# Patient Record
Sex: Female | Born: 1940 | Race: White | Hispanic: No | Marital: Married | Smoking: Former smoker
Health system: Southern US, Community
[De-identification: ages and names within clinical notes are randomized; demographics above are authoritative.]

## PROBLEM LIST (undated history)

## (undated) DIAGNOSIS — K219 Gastro-esophageal reflux disease without esophagitis: Secondary | ICD-10-CM

## (undated) DIAGNOSIS — I499 Cardiac arrhythmia, unspecified: Secondary | ICD-10-CM

## (undated) DIAGNOSIS — R6251 Failure to thrive (child): Secondary | ICD-10-CM

## (undated) DIAGNOSIS — R51 Headache: Secondary | ICD-10-CM

## (undated) DIAGNOSIS — H409 Unspecified glaucoma: Secondary | ICD-10-CM

## (undated) DIAGNOSIS — G43909 Migraine, unspecified, not intractable, without status migrainosus: Secondary | ICD-10-CM

## (undated) DIAGNOSIS — F329 Major depressive disorder, single episode, unspecified: Secondary | ICD-10-CM

## (undated) DIAGNOSIS — F0390 Unspecified dementia without behavioral disturbance: Secondary | ICD-10-CM

## (undated) DIAGNOSIS — M1990 Unspecified osteoarthritis, unspecified site: Secondary | ICD-10-CM

## (undated) DIAGNOSIS — F39 Unspecified mood [affective] disorder: Secondary | ICD-10-CM

## (undated) DIAGNOSIS — F32A Depression, unspecified: Secondary | ICD-10-CM

## (undated) DIAGNOSIS — N289 Disorder of kidney and ureter, unspecified: Secondary | ICD-10-CM

## (undated) DIAGNOSIS — Z972 Presence of dental prosthetic device (complete) (partial): Secondary | ICD-10-CM

## (undated) DIAGNOSIS — E7800 Pure hypercholesterolemia, unspecified: Secondary | ICD-10-CM

## (undated) DIAGNOSIS — Z87442 Personal history of urinary calculi: Secondary | ICD-10-CM

## (undated) DIAGNOSIS — I509 Heart failure, unspecified: Secondary | ICD-10-CM

## (undated) DIAGNOSIS — D509 Iron deficiency anemia, unspecified: Secondary | ICD-10-CM

## (undated) DIAGNOSIS — E538 Deficiency of other specified B group vitamins: Secondary | ICD-10-CM

## (undated) DIAGNOSIS — K259 Gastric ulcer, unspecified as acute or chronic, without hemorrhage or perforation: Secondary | ICD-10-CM

## (undated) DIAGNOSIS — I5022 Chronic systolic (congestive) heart failure: Secondary | ICD-10-CM

## (undated) DIAGNOSIS — M810 Age-related osteoporosis without current pathological fracture: Secondary | ICD-10-CM

## (undated) DIAGNOSIS — R519 Headache, unspecified: Secondary | ICD-10-CM

## (undated) HISTORY — DX: Deficiency of other specified B group vitamins: E53.8

## (undated) HISTORY — PX: CHOLECYSTECTOMY: SHX55

## (undated) HISTORY — DX: Unspecified glaucoma: H40.9

## (undated) HISTORY — DX: Iron deficiency anemia, unspecified: D50.9

## (undated) HISTORY — PX: APPENDECTOMY: SHX54

## (undated) HISTORY — DX: Migraine, unspecified, not intractable, without status migrainosus: G43.909

## (undated) HISTORY — DX: Heart failure, unspecified: I50.9

## (undated) HISTORY — DX: Pure hypercholesterolemia, unspecified: E78.00

## (undated) HISTORY — PX: ESOPHAGOGASTRODUODENOSCOPY: SHX1529

## (undated) HISTORY — PX: LITHOTRIPSY: SUR834

## (undated) HISTORY — PX: BREAST SURGERY: SHX581

## (undated) HISTORY — DX: Personal history of urinary calculi: Z87.442

## (undated) HISTORY — DX: Chronic systolic (congestive) heart failure: I50.22

## (undated) HISTORY — DX: Gastric ulcer, unspecified as acute or chronic, without hemorrhage or perforation: K25.9

## (undated) HISTORY — PX: ABDOMINAL SURGERY: SHX537

## (undated) HISTORY — PX: KYPHOPLASTY: SHX5884

## (undated) HISTORY — DX: Unspecified mood (affective) disorder: F39

## (undated) HISTORY — DX: Age-related osteoporosis without current pathological fracture: M81.0

## (undated) HISTORY — PX: COLONOSCOPY: SHX174

---

## 2003-05-03 ENCOUNTER — Ambulatory Visit (HOSPITAL_COMMUNITY): Admission: RE | Admit: 2003-05-03 | Discharge: 2003-05-03 | Payer: Self-pay | Admitting: Neurosurgery

## 2003-05-03 ENCOUNTER — Encounter: Payer: Self-pay | Admitting: Neurosurgery

## 2004-12-11 ENCOUNTER — Ambulatory Visit: Payer: Self-pay | Admitting: Internal Medicine

## 2004-12-27 ENCOUNTER — Ambulatory Visit: Payer: Self-pay | Admitting: Internal Medicine

## 2005-01-26 ENCOUNTER — Ambulatory Visit: Payer: Self-pay | Admitting: Internal Medicine

## 2005-02-12 ENCOUNTER — Emergency Department: Payer: Self-pay | Admitting: Emergency Medicine

## 2005-03-07 ENCOUNTER — Ambulatory Visit: Payer: Self-pay | Admitting: Internal Medicine

## 2005-03-20 ENCOUNTER — Ambulatory Visit: Payer: Self-pay | Admitting: Unknown Physician Specialty

## 2005-03-29 ENCOUNTER — Ambulatory Visit: Payer: Self-pay | Admitting: Internal Medicine

## 2005-04-17 ENCOUNTER — Inpatient Hospital Stay: Payer: Self-pay | Admitting: Unknown Physician Specialty

## 2005-06-26 ENCOUNTER — Ambulatory Visit: Payer: Self-pay | Admitting: Internal Medicine

## 2005-06-28 ENCOUNTER — Ambulatory Visit: Payer: Self-pay | Admitting: Internal Medicine

## 2005-09-18 ENCOUNTER — Ambulatory Visit: Payer: Self-pay | Admitting: Unknown Physician Specialty

## 2005-09-18 ENCOUNTER — Ambulatory Visit: Payer: Self-pay | Admitting: Urology

## 2005-09-23 ENCOUNTER — Ambulatory Visit: Payer: Self-pay | Admitting: Urology

## 2005-09-24 ENCOUNTER — Ambulatory Visit: Payer: Self-pay | Admitting: Internal Medicine

## 2005-09-26 ENCOUNTER — Ambulatory Visit: Payer: Self-pay | Admitting: Urology

## 2005-09-27 ENCOUNTER — Emergency Department: Payer: Self-pay | Admitting: Emergency Medicine

## 2005-09-28 ENCOUNTER — Ambulatory Visit: Payer: Self-pay | Admitting: Internal Medicine

## 2005-10-27 ENCOUNTER — Ambulatory Visit: Payer: Self-pay | Admitting: Internal Medicine

## 2005-11-26 ENCOUNTER — Ambulatory Visit: Payer: Self-pay | Admitting: Internal Medicine

## 2005-12-24 ENCOUNTER — Ambulatory Visit: Payer: Self-pay | Admitting: Unknown Physician Specialty

## 2005-12-27 ENCOUNTER — Ambulatory Visit: Payer: Self-pay | Admitting: Internal Medicine

## 2006-01-26 ENCOUNTER — Ambulatory Visit: Payer: Self-pay | Admitting: Internal Medicine

## 2006-02-26 ENCOUNTER — Ambulatory Visit: Payer: Self-pay | Admitting: Internal Medicine

## 2006-02-28 ENCOUNTER — Emergency Department: Payer: Self-pay | Admitting: General Practice

## 2006-03-29 ENCOUNTER — Ambulatory Visit: Payer: Self-pay | Admitting: Internal Medicine

## 2006-04-28 ENCOUNTER — Ambulatory Visit: Payer: Self-pay | Admitting: Internal Medicine

## 2006-06-05 ENCOUNTER — Ambulatory Visit: Payer: Self-pay | Admitting: Internal Medicine

## 2006-06-09 ENCOUNTER — Ambulatory Visit: Payer: Self-pay | Admitting: Unknown Physician Specialty

## 2006-06-28 ENCOUNTER — Ambulatory Visit: Payer: Self-pay | Admitting: Internal Medicine

## 2006-08-05 ENCOUNTER — Emergency Department: Payer: Self-pay | Admitting: Emergency Medicine

## 2006-08-28 ENCOUNTER — Ambulatory Visit: Payer: Self-pay | Admitting: Internal Medicine

## 2006-09-27 ENCOUNTER — Ambulatory Visit: Payer: Self-pay | Admitting: Internal Medicine

## 2006-09-29 ENCOUNTER — Ambulatory Visit: Payer: Self-pay | Admitting: Internal Medicine

## 2006-10-28 ENCOUNTER — Ambulatory Visit: Payer: Self-pay | Admitting: Internal Medicine

## 2006-11-27 ENCOUNTER — Ambulatory Visit: Payer: Self-pay | Admitting: Internal Medicine

## 2007-01-12 ENCOUNTER — Ambulatory Visit: Payer: Self-pay | Admitting: Internal Medicine

## 2007-01-27 ENCOUNTER — Ambulatory Visit: Payer: Self-pay | Admitting: Internal Medicine

## 2007-02-27 ENCOUNTER — Ambulatory Visit: Payer: Self-pay | Admitting: Internal Medicine

## 2007-03-11 ENCOUNTER — Ambulatory Visit: Payer: Self-pay | Admitting: Internal Medicine

## 2007-03-26 ENCOUNTER — Ambulatory Visit: Payer: Self-pay | Admitting: Urology

## 2007-03-30 ENCOUNTER — Ambulatory Visit: Payer: Self-pay | Admitting: Internal Medicine

## 2007-05-11 ENCOUNTER — Ambulatory Visit: Payer: Self-pay | Admitting: Internal Medicine

## 2007-05-30 ENCOUNTER — Ambulatory Visit: Payer: Self-pay | Admitting: Internal Medicine

## 2007-07-06 ENCOUNTER — Ambulatory Visit: Payer: Self-pay | Admitting: Internal Medicine

## 2007-07-30 ENCOUNTER — Ambulatory Visit: Payer: Self-pay | Admitting: Internal Medicine

## 2007-08-30 ENCOUNTER — Ambulatory Visit: Payer: Self-pay | Admitting: Internal Medicine

## 2007-09-01 ENCOUNTER — Ambulatory Visit: Payer: Self-pay | Admitting: Unknown Physician Specialty

## 2007-09-14 ENCOUNTER — Ambulatory Visit: Payer: Self-pay | Admitting: Internal Medicine

## 2007-10-16 ENCOUNTER — Ambulatory Visit: Payer: Self-pay | Admitting: Internal Medicine

## 2007-10-28 ENCOUNTER — Ambulatory Visit: Payer: Self-pay | Admitting: Internal Medicine

## 2007-11-27 ENCOUNTER — Ambulatory Visit: Payer: Self-pay | Admitting: Internal Medicine

## 2008-01-15 ENCOUNTER — Ambulatory Visit: Payer: Self-pay | Admitting: Internal Medicine

## 2008-01-27 ENCOUNTER — Ambulatory Visit: Payer: Self-pay | Admitting: Internal Medicine

## 2008-03-29 ENCOUNTER — Ambulatory Visit: Payer: Self-pay | Admitting: Internal Medicine

## 2008-04-15 ENCOUNTER — Ambulatory Visit: Payer: Self-pay | Admitting: Internal Medicine

## 2008-04-28 ENCOUNTER — Ambulatory Visit: Payer: Self-pay | Admitting: Internal Medicine

## 2008-05-10 ENCOUNTER — Ambulatory Visit: Payer: Self-pay | Admitting: Nurse Practitioner

## 2008-06-28 ENCOUNTER — Ambulatory Visit: Payer: Self-pay | Admitting: Internal Medicine

## 2008-07-08 ENCOUNTER — Ambulatory Visit: Payer: Self-pay | Admitting: Internal Medicine

## 2008-07-29 ENCOUNTER — Ambulatory Visit: Payer: Self-pay | Admitting: Internal Medicine

## 2008-09-26 ENCOUNTER — Ambulatory Visit: Payer: Self-pay | Admitting: Internal Medicine

## 2008-09-28 ENCOUNTER — Ambulatory Visit: Payer: Self-pay | Admitting: Unknown Physician Specialty

## 2008-09-30 ENCOUNTER — Ambulatory Visit: Payer: Self-pay | Admitting: Internal Medicine

## 2008-10-27 ENCOUNTER — Ambulatory Visit: Payer: Self-pay | Admitting: Internal Medicine

## 2009-01-26 ENCOUNTER — Ambulatory Visit: Payer: Self-pay | Admitting: Unknown Physician Specialty

## 2009-02-26 ENCOUNTER — Ambulatory Visit: Payer: Self-pay | Admitting: Internal Medicine

## 2009-03-17 ENCOUNTER — Ambulatory Visit: Payer: Self-pay | Admitting: Internal Medicine

## 2009-03-29 ENCOUNTER — Ambulatory Visit: Payer: Self-pay | Admitting: Internal Medicine

## 2009-04-28 ENCOUNTER — Ambulatory Visit: Payer: Self-pay | Admitting: Internal Medicine

## 2009-05-29 ENCOUNTER — Ambulatory Visit: Payer: Self-pay | Admitting: Internal Medicine

## 2009-07-13 ENCOUNTER — Ambulatory Visit: Payer: Self-pay | Admitting: Unknown Physician Specialty

## 2009-09-01 ENCOUNTER — Ambulatory Visit: Payer: Self-pay | Admitting: Internal Medicine

## 2009-09-26 ENCOUNTER — Ambulatory Visit: Payer: Self-pay | Admitting: Internal Medicine

## 2009-10-19 ENCOUNTER — Ambulatory Visit: Payer: Self-pay | Admitting: Unknown Physician Specialty

## 2009-10-27 ENCOUNTER — Ambulatory Visit: Payer: Self-pay | Admitting: Internal Medicine

## 2009-11-15 ENCOUNTER — Ambulatory Visit: Payer: Self-pay | Admitting: Internal Medicine

## 2009-11-26 ENCOUNTER — Ambulatory Visit: Payer: Self-pay | Admitting: Internal Medicine

## 2009-12-18 ENCOUNTER — Emergency Department: Payer: Self-pay | Admitting: Emergency Medicine

## 2009-12-27 ENCOUNTER — Ambulatory Visit: Payer: Self-pay | Admitting: Internal Medicine

## 2010-01-10 ENCOUNTER — Ambulatory Visit: Payer: Self-pay | Admitting: Internal Medicine

## 2010-01-26 ENCOUNTER — Ambulatory Visit: Payer: Self-pay | Admitting: Internal Medicine

## 2010-04-20 ENCOUNTER — Ambulatory Visit: Payer: Self-pay

## 2010-04-28 ENCOUNTER — Ambulatory Visit: Payer: Self-pay | Admitting: Internal Medicine

## 2010-05-01 ENCOUNTER — Encounter: Admission: RE | Admit: 2010-05-01 | Discharge: 2010-05-01 | Payer: Self-pay | Admitting: Unknown Physician Specialty

## 2010-05-14 ENCOUNTER — Ambulatory Visit: Payer: Self-pay | Admitting: Internal Medicine

## 2010-05-22 ENCOUNTER — Encounter: Admission: RE | Admit: 2010-05-22 | Discharge: 2010-05-22 | Payer: Self-pay | Admitting: Unknown Physician Specialty

## 2010-05-29 ENCOUNTER — Ambulatory Visit: Payer: Self-pay | Admitting: Internal Medicine

## 2010-07-16 ENCOUNTER — Inpatient Hospital Stay: Payer: Self-pay | Admitting: Family Medicine

## 2010-12-11 NOTE — Assessment & Plan Note (Signed)
NAME:  Ashley Young, Ashley Young NO.:  [id number]   MEDICAL RECORD NO.:  [id number]          PATIENT TYPE:  POB   LOCATION:  [facility] at [facility]         FACILITY:  [facility]   PHYSICIAN:  Allie Bossier, MD        DATE OF BIRTH:  10/26/1940   DATE OF SERVICE:                                  CLINIC NOTE   The patient comes to the office today for ongoing management of her  migraine headaches.  The patient has been a long-standing patient of Dr.  Rudene Christians.  Following his retirement, she was referred to me.  We  had a brief discussion today.  She is currently a 70 year old Caucasian  female who has had multiple medications for headache.  She is currently  using for headache management Darvocet, Percocet, Stadol, and Demerol.  The patient was informed that I do not do a chronic pain medication  management and she is asked to [facility].  She will not be made a  return appointment.      Remonia Richter, NP    ______________________________  Allie Bossier, MD    LR/MEDQ  D:  05/10/2008  T:  05/11/2008  Job:  [id number]

## 2011-01-29 ENCOUNTER — Ambulatory Visit: Payer: Self-pay | Admitting: Urology

## 2011-02-12 ENCOUNTER — Ambulatory Visit: Payer: Self-pay | Admitting: Urology

## 2011-02-26 ENCOUNTER — Ambulatory Visit: Payer: Self-pay | Admitting: Urology

## 2011-03-05 ENCOUNTER — Ambulatory Visit: Payer: Self-pay | Admitting: Urology

## 2011-03-07 ENCOUNTER — Ambulatory Visit: Payer: Self-pay | Admitting: Urology

## 2011-03-14 ENCOUNTER — Emergency Department: Payer: Self-pay | Admitting: Emergency Medicine

## 2011-03-14 ENCOUNTER — Ambulatory Visit: Payer: Self-pay | Admitting: Urology

## 2011-04-03 ENCOUNTER — Ambulatory Visit: Payer: Self-pay | Admitting: Urology

## 2011-05-01 ENCOUNTER — Ambulatory Visit: Payer: Self-pay | Admitting: Cardiovascular Disease

## 2011-05-03 ENCOUNTER — Ambulatory Visit: Payer: Self-pay | Admitting: Urology

## 2011-06-03 ENCOUNTER — Ambulatory Visit: Payer: Self-pay | Admitting: Urology

## 2011-07-03 ENCOUNTER — Ambulatory Visit: Payer: Self-pay | Admitting: Urology

## 2011-08-05 ENCOUNTER — Ambulatory Visit: Payer: Self-pay | Admitting: Urology

## 2012-02-12 ENCOUNTER — Ambulatory Visit: Payer: Self-pay | Admitting: Oncology

## 2012-02-24 ENCOUNTER — Ambulatory Visit: Payer: Self-pay | Admitting: Urology

## 2012-02-28 ENCOUNTER — Ambulatory Visit: Payer: Self-pay | Admitting: Internal Medicine

## 2012-02-28 ENCOUNTER — Ambulatory Visit: Payer: Self-pay | Admitting: Oncology

## 2012-03-17 ENCOUNTER — Ambulatory Visit: Payer: Self-pay | Admitting: Urology

## 2012-03-29 ENCOUNTER — Ambulatory Visit: Payer: Self-pay | Admitting: Internal Medicine

## 2012-03-29 ENCOUNTER — Ambulatory Visit: Payer: Self-pay | Admitting: Oncology

## 2012-05-22 ENCOUNTER — Ambulatory Visit: Payer: Self-pay | Admitting: Internal Medicine

## 2012-05-22 LAB — CBC CANCER CENTER
Basophil #: 0.1 x10 3/mm (ref 0.0–0.1)
Basophil %: 1.3 %
Eosinophil #: 0.2 x10 3/mm (ref 0.0–0.7)
Eosinophil %: 3 %
HCT: 43.2 % (ref 35.0–47.0)
HGB: 13.6 g/dL (ref 12.0–16.0)
Lymphocyte #: 1.4 x10 3/mm (ref 1.0–3.6)
Lymphocyte %: 23.2 %
MCH: 27.4 pg (ref 26.0–34.0)
MCHC: 31.4 g/dL — ABNORMAL LOW (ref 32.0–36.0)
MCV: 87 fL (ref 80–100)
Monocyte #: 0.6 x10 3/mm (ref 0.2–0.9)
Monocyte %: 9.5 %
Neutrophil #: 3.7 x10 3/mm (ref 1.4–6.5)
Neutrophil %: 63 %
Platelet: 274 x10 3/mm (ref 150–440)
RBC: 4.95 10*6/uL (ref 3.80–5.20)
RDW: 19 % — ABNORMAL HIGH (ref 11.5–14.5)
WBC: 5.9 x10 3/mm (ref 3.6–11.0)

## 2012-05-22 LAB — IRON AND TIBC
Iron Bind.Cap.(Total): 319 ug/dL (ref 250–450)
Iron Saturation: 15 %
Iron: 49 ug/dL — ABNORMAL LOW (ref 50–170)
Unbound Iron-Bind.Cap.: 270 ug/dL

## 2012-05-22 LAB — RETICULOCYTES
Absolute Retic Count: 0.0277 10*6/uL (ref 0.023–0.096)
Reticulocyte: 0.56 % (ref 0.5–2.2)

## 2012-05-22 LAB — FERRITIN: Ferritin (ARMC): 26 ng/mL (ref 8–388)

## 2012-05-22 LAB — FOLATE: Folic Acid: 9.9 ng/mL (ref 3.1–100.0)

## 2012-05-29 ENCOUNTER — Ambulatory Visit: Payer: Self-pay | Admitting: Internal Medicine

## 2012-06-24 ENCOUNTER — Ambulatory Visit: Payer: Self-pay

## 2012-07-02 ENCOUNTER — Ambulatory Visit: Payer: Self-pay

## 2012-08-10 ENCOUNTER — Ambulatory Visit: Payer: Self-pay | Admitting: Urology

## 2012-08-13 ENCOUNTER — Ambulatory Visit: Payer: Self-pay | Admitting: Urology

## 2012-08-20 ENCOUNTER — Ambulatory Visit: Payer: Self-pay | Admitting: Urology

## 2012-10-27 ENCOUNTER — Ambulatory Visit: Payer: Self-pay | Admitting: Internal Medicine

## 2012-12-14 ENCOUNTER — Ambulatory Visit: Payer: Self-pay | Admitting: Orthopedic Surgery

## 2012-12-16 ENCOUNTER — Ambulatory Visit: Payer: Self-pay | Admitting: Orthopedic Surgery

## 2013-01-28 ENCOUNTER — Ambulatory Visit: Payer: Self-pay

## 2013-02-19 ENCOUNTER — Inpatient Hospital Stay: Payer: Self-pay | Admitting: Family Medicine

## 2013-02-19 LAB — PROTIME-INR
INR: 1.2
Prothrombin Time: 15.2 secs — ABNORMAL HIGH (ref 11.5–14.7)

## 2013-02-19 LAB — COMPREHENSIVE METABOLIC PANEL
Alkaline Phosphatase: 309 U/L — ABNORMAL HIGH (ref 50–136)
Anion Gap: 11 (ref 7–16)
BUN: 24 mg/dL — ABNORMAL HIGH (ref 7–18)
Bilirubin,Total: 0.4 mg/dL (ref 0.2–1.0)
Creatinine: 0.89 mg/dL (ref 0.60–1.30)
EGFR (African American): >60
EGFR (Non-African Amer.): >60
Glucose: 95 mg/dL (ref 65–99)
Osmolality: 274 (ref 275–301)
SGOT(AST): 201 U/L — ABNORMAL HIGH (ref 15–37)
SGPT (ALT): 122 U/L — ABNORMAL HIGH (ref 12–78)
Sodium: 135 mmol/L — ABNORMAL LOW (ref 136–145)

## 2013-02-19 LAB — URINALYSIS, COMPLETE
Bilirubin,UR: NEGATIVE
Granular Cast: 12
Nitrite: NEGATIVE
RBC,UR: 12 /HPF (ref 0–5)
Specific Gravity: 1.019 (ref 1.003–1.030)
Squamous Epithelial: <1
WBC UR: 5 /HPF (ref 0–5)

## 2013-02-19 LAB — CBC WITH DIFFERENTIAL/PLATELET
Basophil #: 0 10*3/uL (ref 0.0–0.1)
Basophil %: 0.5 %
Eosinophil #: 0 10*3/uL (ref 0.0–0.7)
Eosinophil %: 0.5 %
HCT: 39.8 % (ref 35.0–47.0)
HGB: 12.9 g/dL (ref 12.0–16.0)
Lymphocyte #: 0.4 10*3/uL — ABNORMAL LOW (ref 1.0–3.6)
MCH: 26.1 pg (ref 26.0–34.0)
MCHC: 32.5 g/dL (ref 32.0–36.0)
MCV: 80 fL (ref 80–100)
Monocyte #: 0.2 x10 3/mm (ref 0.2–0.9)
Monocyte %: 3.1 %
Neutrophil %: 90.1 %
RBC: 4.97 10*6/uL (ref 3.80–5.20)
RDW: 15.9 % — ABNORMAL HIGH (ref 11.5–14.5)
WBC: 6.7 10*3/uL (ref 3.6–11.0)

## 2013-02-19 LAB — TROPONIN I: Troponin-I: 0.13 ng/mL — ABNORMAL HIGH

## 2013-02-20 LAB — COMPREHENSIVE METABOLIC PANEL
Albumin: 2.1 g/dL — ABNORMAL LOW (ref 3.4–5.0)
Alkaline Phosphatase: 245 U/L — ABNORMAL HIGH (ref 50–136)
Calcium, Total: 7.1 mg/dL — ABNORMAL LOW (ref 8.5–10.1)
Chloride: 110 mmol/L — ABNORMAL HIGH (ref 98–107)
Creatinine: 0.56 mg/dL — ABNORMAL LOW (ref 0.60–1.30)
Glucose: 43 mg/dL — ABNORMAL LOW (ref 65–99)
SGPT (ALT): 107 U/L — ABNORMAL HIGH (ref 12–78)
Sodium: 141 mmol/L (ref 136–145)
Total Protein: 5.1 g/dL — ABNORMAL LOW (ref 6.4–8.2)

## 2013-02-20 LAB — CBC WITH DIFFERENTIAL/PLATELET
Basophil #: 0 10*3/uL (ref 0.0–0.1)
Basophil %: 0.3 %
Eosinophil #: 0 10*3/uL (ref 0.0–0.7)
Eosinophil %: 0.9 %
HGB: 12.2 g/dL (ref 12.0–16.0)
Lymphocyte #: 0.3 10*3/uL — ABNORMAL LOW (ref 1.0–3.6)
Lymphocyte %: 5.2 %
MCHC: 32.4 g/dL (ref 32.0–36.0)
Monocyte %: 3.4 %
Neutrophil #: 5.1 10*3/uL (ref 1.4–6.5)
Neutrophil %: 90.2 %
RBC: 4.62 10*6/uL (ref 3.80–5.20)
RDW: 16 % — ABNORMAL HIGH (ref 11.5–14.5)
WBC: 5.7 10*3/uL (ref 3.6–11.0)

## 2013-02-20 LAB — URINE CULTURE

## 2013-02-20 LAB — TROPONIN I: Troponin-I: 0.1 ng/mL — ABNORMAL HIGH

## 2013-02-23 LAB — PATHOLOGY REPORT

## 2013-03-30 ENCOUNTER — Ambulatory Visit: Payer: Self-pay | Admitting: Internal Medicine

## 2013-03-31 ENCOUNTER — Inpatient Hospital Stay: Payer: Self-pay | Admitting: Family Medicine

## 2013-03-31 LAB — COMPREHENSIVE METABOLIC PANEL
Albumin: 2.4 g/dL — ABNORMAL LOW (ref 3.4–5.0)
Alkaline Phosphatase: 132 U/L (ref 50–136)
Anion Gap: 4 — ABNORMAL LOW (ref 7–16)
BUN: 13 mg/dL (ref 7–18)
Bilirubin,Total: 0.2 mg/dL (ref 0.2–1.0)
Chloride: 104 mmol/L (ref 98–107)
Creatinine: 0.59 mg/dL — ABNORMAL LOW (ref 0.60–1.30)
EGFR (African American): >60
EGFR (Non-African Amer.): >60
Glucose: 82 mg/dL (ref 65–99)
Potassium: 3.5 mmol/L (ref 3.5–5.1)
SGOT(AST): 29 U/L (ref 15–37)
Total Protein: 6.1 g/dL — ABNORMAL LOW (ref 6.4–8.2)

## 2013-03-31 LAB — URINALYSIS, COMPLETE
Bilirubin,UR: NEGATIVE
Blood: NEGATIVE
Ketone: NEGATIVE
Ph: 6 (ref 4.5–8.0)
RBC,UR: 2 /HPF (ref 0–5)
WBC UR: 2 /HPF (ref 0–5)

## 2013-03-31 LAB — PROTIME-INR
INR: 1
Prothrombin Time: 13.2 secs (ref 11.5–14.7)

## 2013-03-31 LAB — CBC
MCHC: 32.7 g/dL (ref 32.0–36.0)
Platelet: 337 10*3/uL (ref 150–440)
RBC: 4.24 10*6/uL (ref 3.80–5.20)
RDW: 16.6 % — ABNORMAL HIGH (ref 11.5–14.5)

## 2013-04-01 LAB — BASIC METABOLIC PANEL
Anion Gap: 3 — ABNORMAL LOW (ref 7–16)
BUN: 11 mg/dL (ref 7–18)
Chloride: 106 mmol/L (ref 98–107)
Co2: 30 mmol/L (ref 21–32)
EGFR (African American): >60
EGFR (Non-African Amer.): >60
Sodium: 139 mmol/L (ref 136–145)

## 2013-04-01 LAB — CBC WITH DIFFERENTIAL/PLATELET
Basophil %: 1.2 %
Eosinophil #: 0.2 10*3/uL (ref 0.0–0.7)
HCT: 31.5 % — ABNORMAL LOW (ref 35.0–47.0)
HGB: 10.2 g/dL — ABNORMAL LOW (ref 12.0–16.0)
Lymphocyte #: 1.2 10*3/uL (ref 1.0–3.6)
MCV: 81 fL (ref 80–100)
Monocyte #: 0.5 x10 3/mm (ref 0.2–0.9)
Monocyte %: 8.3 %
Neutrophil #: 4.6 10*3/uL (ref 1.4–6.5)
Neutrophil %: 69.3 %
Platelet: 264 10*3/uL (ref 150–440)
RBC: 3.89 10*6/uL (ref 3.80–5.20)
RDW: 17.2 % — ABNORMAL HIGH (ref 11.5–14.5)
WBC: 6.6 10*3/uL (ref 3.6–11.0)

## 2013-04-01 LAB — COMPREHENSIVE METABOLIC PANEL
Albumin: 2 g/dL — ABNORMAL LOW (ref 3.4–5.0)
Total Protein: 5.1 g/dL — ABNORMAL LOW (ref 6.4–8.2)

## 2013-04-01 LAB — TSH: Thyroid Stimulating Horm: 3.19 u[IU]/mL

## 2013-04-01 LAB — PROTIME-INR
INR: 1
Prothrombin Time: 13.2 secs (ref 11.5–14.7)

## 2013-04-01 LAB — MAGNESIUM: Magnesium: 1.9 mg/dL

## 2013-04-02 LAB — BASIC METABOLIC PANEL
Anion Gap: 5 — ABNORMAL LOW (ref 7–16)
Co2: 29 mmol/L (ref 21–32)
Creatinine: 0.53 mg/dL — ABNORMAL LOW (ref 0.60–1.30)
EGFR (African American): >60
EGFR (Non-African Amer.): >60
Glucose: 101 mg/dL — ABNORMAL HIGH (ref 65–99)
Osmolality: 272 (ref 275–301)
Potassium: 4 mmol/L (ref 3.5–5.1)

## 2013-04-02 LAB — CBC WITH DIFFERENTIAL/PLATELET
Basophil %: 0.7 %
Eosinophil #: 0.3 10*3/uL (ref 0.0–0.7)
Eosinophil %: 2.8 %
HGB: 10 g/dL — ABNORMAL LOW (ref 12.0–16.0)
MCH: 26.5 pg (ref 26.0–34.0)
MCHC: 32.6 g/dL (ref 32.0–36.0)
Monocyte #: 0.6 x10 3/mm (ref 0.2–0.9)
Monocyte %: 5.2 %
Neutrophil #: 9.6 10*3/uL — ABNORMAL HIGH (ref 1.4–6.5)
Neutrophil %: 85 %
RBC: 3.76 10*6/uL — ABNORMAL LOW (ref 3.80–5.20)

## 2013-04-03 LAB — CBC WITH DIFFERENTIAL/PLATELET
Basophil #: 0.1 10*3/uL (ref 0.0–0.1)
Eosinophil %: 5.4 %
HCT: 31.8 % — ABNORMAL LOW (ref 35.0–47.0)
HGB: 10.7 g/dL — ABNORMAL LOW (ref 12.0–16.0)
Lymphocyte #: 1.1 10*3/uL (ref 1.0–3.6)
Lymphocyte %: 15.1 %
MCH: 27.5 pg (ref 26.0–34.0)
Monocyte #: 0.7 x10 3/mm (ref 0.2–0.9)
Monocyte %: 9.3 %
Platelet: 327 10*3/uL (ref 150–440)
RDW: 17.3 % — ABNORMAL HIGH (ref 11.5–14.5)

## 2013-04-03 LAB — COMPREHENSIVE METABOLIC PANEL
Alkaline Phosphatase: 117 U/L (ref 50–136)
Anion Gap: 3 — ABNORMAL LOW (ref 7–16)
Bilirubin,Total: 0.1 mg/dL — ABNORMAL LOW (ref 0.2–1.0)
Calcium, Total: 7.9 mg/dL — ABNORMAL LOW (ref 8.5–10.1)
Co2: 29 mmol/L (ref 21–32)
EGFR (Non-African Amer.): >60
Glucose: 64 mg/dL — ABNORMAL LOW (ref 65–99)
Potassium: 3.8 mmol/L (ref 3.5–5.1)
SGOT(AST): 20 U/L (ref 15–37)
Sodium: 140 mmol/L (ref 136–145)
Total Protein: 4.9 g/dL — ABNORMAL LOW (ref 6.4–8.2)

## 2013-04-04 LAB — BASIC METABOLIC PANEL
Anion Gap: 6 — ABNORMAL LOW (ref 7–16)
Calcium, Total: 8.6 mg/dL (ref 8.5–10.1)
Creatinine: 0.43 mg/dL — ABNORMAL LOW (ref 0.60–1.30)
EGFR (African American): >60
EGFR (Non-African Amer.): >60
Glucose: 89 mg/dL (ref 65–99)
Osmolality: 277 (ref 275–301)
Potassium: 4.3 mmol/L (ref 3.5–5.1)
Sodium: 140 mmol/L (ref 136–145)

## 2013-04-04 LAB — CBC WITH DIFFERENTIAL/PLATELET
Basophil #: 0.1 10*3/uL (ref 0.0–0.1)
Eosinophil #: 0.3 10*3/uL (ref 0.0–0.7)
HCT: 34.9 % — ABNORMAL LOW (ref 35.0–47.0)
Lymphocyte %: 17.2 %
MCH: 26.5 pg (ref 26.0–34.0)
MCV: 82 fL (ref 80–100)
Monocyte #: 0.6 x10 3/mm (ref 0.2–0.9)
Monocyte %: 8 %
Neutrophil #: 5.4 10*3/uL (ref 1.4–6.5)
Neutrophil %: 69.4 %
Platelet: 391 10*3/uL (ref 150–440)
RDW: 17.2 % — ABNORMAL HIGH (ref 11.5–14.5)
WBC: 7.8 10*3/uL (ref 3.6–11.0)

## 2013-04-05 ENCOUNTER — Encounter: Payer: Self-pay | Admitting: Internal Medicine

## 2013-04-05 LAB — CBC WITH DIFFERENTIAL/PLATELET
Basophil #: 0.1 10*3/uL (ref 0.0–0.1)
Basophil %: 1.1 %
Eosinophil #: 0.3 10*3/uL (ref 0.0–0.7)
HGB: 10.3 g/dL — ABNORMAL LOW (ref 12.0–16.0)
Lymphocyte #: 1.6 10*3/uL (ref 1.0–3.6)
Lymphocyte %: 25.8 %
MCHC: 32.5 g/dL (ref 32.0–36.0)
Monocyte #: 0.5 x10 3/mm (ref 0.2–0.9)
Monocyte %: 8.6 %
Neutrophil #: 3.6 10*3/uL (ref 1.4–6.5)
Neutrophil %: 59.3 %
RBC: 3.92 10*6/uL (ref 3.80–5.20)
RDW: 17.2 % — ABNORMAL HIGH (ref 11.5–14.5)
WBC: 6.1 10*3/uL (ref 3.6–11.0)

## 2013-04-05 LAB — BASIC METABOLIC PANEL
Anion Gap: 2 — ABNORMAL LOW (ref 7–16)
BUN: 9 mg/dL (ref 7–18)
Calcium, Total: 8.1 mg/dL — ABNORMAL LOW (ref 8.5–10.1)
Chloride: 107 mmol/L (ref 98–107)
Co2: 28 mmol/L (ref 21–32)
Creatinine: 0.56 mg/dL — ABNORMAL LOW (ref 0.60–1.30)
EGFR (Non-African Amer.): >60
Glucose: 91 mg/dL (ref 65–99)
Potassium: 4.6 mmol/L (ref 3.5–5.1)
Sodium: 137 mmol/L (ref 136–145)

## 2013-04-15 ENCOUNTER — Ambulatory Visit: Payer: Self-pay | Admitting: Orthopedic Surgery

## 2013-04-19 ENCOUNTER — Ambulatory Visit: Payer: Self-pay | Admitting: Gerontology

## 2013-04-28 ENCOUNTER — Encounter: Payer: Self-pay | Admitting: Internal Medicine

## 2013-04-28 ENCOUNTER — Ambulatory Visit: Payer: Self-pay | Admitting: Internal Medicine

## 2013-05-31 ENCOUNTER — Emergency Department: Payer: Self-pay | Admitting: Emergency Medicine

## 2013-05-31 LAB — BASIC METABOLIC PANEL
Anion Gap: 5 — ABNORMAL LOW (ref 7–16)
Chloride: 106 mmol/L (ref 98–107)
Co2: 29 mmol/L (ref 21–32)
Creatinine: 0.58 mg/dL — ABNORMAL LOW (ref 0.60–1.30)
EGFR (African American): >60
EGFR (Non-African Amer.): >60
Glucose: 91 mg/dL (ref 65–99)
Osmolality: 281 (ref 275–301)

## 2013-05-31 LAB — PROTIME-INR
INR: 0.9
Prothrombin Time: 12.7 secs (ref 11.5–14.7)

## 2013-05-31 LAB — TROPONIN I
Troponin-I: <0.02 ng/mL
Troponin-I: <0.02 ng/mL

## 2013-05-31 LAB — CK TOTAL AND CKMB (NOT AT ARMC)
CK, Total: 76 U/L (ref 21–215)
CK-MB: 3.9 ng/mL — ABNORMAL HIGH (ref 0.5–3.6)
CK-MB: 2.9 ng/mL (ref 0.5–3.6)

## 2013-05-31 LAB — CBC
MCH: 29.4 pg (ref 26.0–34.0)
Platelet: 311 10*3/uL (ref 150–440)

## 2013-08-20 DIAGNOSIS — M810 Age-related osteoporosis without current pathological fracture: Secondary | ICD-10-CM | POA: Insufficient documentation

## 2013-08-20 DIAGNOSIS — G43909 Migraine, unspecified, not intractable, without status migrainosus: Secondary | ICD-10-CM | POA: Insufficient documentation

## 2013-08-20 DIAGNOSIS — T39095A Adverse effect of salicylates, initial encounter: Secondary | ICD-10-CM | POA: Insufficient documentation

## 2013-08-20 DIAGNOSIS — F329 Major depressive disorder, single episode, unspecified: Secondary | ICD-10-CM | POA: Insufficient documentation

## 2013-08-20 DIAGNOSIS — K209 Esophagitis, unspecified without bleeding: Secondary | ICD-10-CM | POA: Insufficient documentation

## 2013-08-20 DIAGNOSIS — E639 Nutritional deficiency, unspecified: Secondary | ICD-10-CM | POA: Insufficient documentation

## 2013-08-20 DIAGNOSIS — E46 Unspecified protein-calorie malnutrition: Secondary | ICD-10-CM | POA: Insufficient documentation

## 2013-08-20 DIAGNOSIS — F419 Anxiety disorder, unspecified: Secondary | ICD-10-CM

## 2013-08-20 DIAGNOSIS — K311 Adult hypertrophic pyloric stenosis: Secondary | ICD-10-CM | POA: Insufficient documentation

## 2013-08-20 DIAGNOSIS — Z8719 Personal history of other diseases of the digestive system: Secondary | ICD-10-CM | POA: Insufficient documentation

## 2013-08-20 DIAGNOSIS — Z8711 Personal history of peptic ulcer disease: Secondary | ICD-10-CM | POA: Insufficient documentation

## 2013-08-20 DIAGNOSIS — F32A Depression, unspecified: Secondary | ICD-10-CM | POA: Insufficient documentation

## 2013-08-20 DIAGNOSIS — N200 Calculus of kidney: Secondary | ICD-10-CM | POA: Insufficient documentation

## 2013-08-29 DEATH — deceased

## 2013-09-24 ENCOUNTER — Ambulatory Visit: Payer: Self-pay | Admitting: Unknown Physician Specialty

## 2013-11-19 DIAGNOSIS — R109 Unspecified abdominal pain: Secondary | ICD-10-CM | POA: Insufficient documentation

## 2014-01-04 DIAGNOSIS — F3340 Major depressive disorder, recurrent, in remission, unspecified: Secondary | ICD-10-CM | POA: Insufficient documentation

## 2014-01-04 DIAGNOSIS — F329 Major depressive disorder, single episode, unspecified: Secondary | ICD-10-CM | POA: Insufficient documentation

## 2014-01-04 DIAGNOSIS — F32A Depression, unspecified: Secondary | ICD-10-CM | POA: Insufficient documentation

## 2014-01-04 DIAGNOSIS — F411 Generalized anxiety disorder: Secondary | ICD-10-CM | POA: Insufficient documentation

## 2014-01-04 DIAGNOSIS — R600 Localized edema: Secondary | ICD-10-CM | POA: Insufficient documentation

## 2014-02-09 DIAGNOSIS — E559 Vitamin D deficiency, unspecified: Secondary | ICD-10-CM | POA: Insufficient documentation

## 2014-03-09 DIAGNOSIS — M5136 Other intervertebral disc degeneration, lumbar region: Secondary | ICD-10-CM | POA: Insufficient documentation

## 2014-06-18 ENCOUNTER — Emergency Department: Payer: Self-pay | Admitting: Emergency Medicine

## 2014-10-27 LAB — URINALYSIS, COMPLETE
Bacteria: NONE SEEN
Bilirubin,UR: NEGATIVE
Blood: NEGATIVE
Glucose,UR: NEGATIVE mg/dL (ref 0–75)
Leukocyte Esterase: NEGATIVE
NITRITE: NEGATIVE
PROTEIN: NEGATIVE
Ph: 5 (ref 4.5–8.0)
RBC,UR: NONE SEEN /HPF (ref 0–5)
SPECIFIC GRAVITY: 1.02 (ref 1.003–1.030)
Squamous Epithelial: 1
WBC UR: NONE SEEN /HPF (ref 0–5)

## 2014-10-27 LAB — CBC
HCT: 37.7 %
HGB: 11.4 g/dL — ABNORMAL LOW
MCH: 22.2 pg — ABNORMAL LOW
MCHC: 30.2 g/dL — ABNORMAL LOW
MCV: 74 fL — ABNORMAL LOW
Platelet: 390 10*3/uL
RBC: 5.12 X10 6/mm 3
RDW: 17.5 % — ABNORMAL HIGH
WBC: 13 10*3/uL — ABNORMAL HIGH

## 2014-10-27 LAB — BASIC METABOLIC PANEL
Anion Gap: 8 (ref 7–16)
BUN: 11 mg/dL
CREATININE: 0.74 mg/dL
Calcium, Total: 7.9 mg/dL — ABNORMAL LOW
Chloride: 102 mmol/L
Co2: 24 mmol/L
EGFR (African American): >60
Glucose: 111 mg/dL — ABNORMAL HIGH
Potassium: 3 mmol/L — ABNORMAL LOW
SODIUM: 134 mmol/L — AB

## 2014-10-28 ENCOUNTER — Inpatient Hospital Stay
Admit: 2014-10-28 | Disposition: A | Discharge status: Home / Self Care | Payer: Self-pay | Attending: Internal Medicine | Admitting: Internal Medicine

## 2014-10-28 LAB — BASIC METABOLIC PANEL
Anion Gap: 7 (ref 7–16)
BUN: 12 mg/dL
CALCIUM: 8.5 mg/dL — AB
Chloride: 105 mmol/L
Co2: 27 mmol/L
Creatinine: 0.72 mg/dL
EGFR (African American): >60
EGFR (Non-African Amer.): >60
Glucose: 126 mg/dL — ABNORMAL HIGH
POTASSIUM: 3.5 mmol/L
Sodium: 139 mmol/L

## 2014-10-28 LAB — HEPATIC FUNCTION PANEL A (ARMC)
ALT: 13 U/L — AB
Albumin: 3.3 g/dL — ABNORMAL LOW
Alkaline Phosphatase: 134 U/L — ABNORMAL HIGH
BILIRUBIN TOTAL: 0.2 mg/dL — AB
Bilirubin, Direct: <0.1 mg/dL
SGOT(AST): 21 U/L
Total Protein: 6.3 g/dL — ABNORMAL LOW

## 2014-10-28 LAB — MAGNESIUM: MAGNESIUM: 1.7 mg/dL

## 2014-10-29 LAB — CBC WITH DIFFERENTIAL/PLATELET
BASOS ABS: 0.1 10*3/uL (ref 0.0–0.1)
Basophil %: 0.8 %
EOS ABS: 0 10*3/uL (ref 0.0–0.7)
EOS PCT: 0.3 %
HCT: 31.4 % — ABNORMAL LOW (ref 35.0–47.0)
HGB: 9.7 g/dL — ABNORMAL LOW (ref 12.0–16.0)
Lymphocyte #: 0.8 10*3/uL — ABNORMAL LOW (ref 1.0–3.6)
Lymphocyte %: 9.7 %
MCH: 22.9 pg — AB (ref 26.0–34.0)
MCHC: 30.9 g/dL — ABNORMAL LOW (ref 32.0–36.0)
MCV: 74 fL — ABNORMAL LOW (ref 80–100)
Monocyte #: 0.5 x10 3/mm (ref 0.2–0.9)
Monocyte %: 6.8 %
NEUTROS ABS: 6.4 10*3/uL (ref 1.4–6.5)
NEUTROS PCT: 82.4 %
Platelet: 352 10*3/uL (ref 150–440)
RBC: 4.25 10*6/uL (ref 3.80–5.20)
RDW: 17.6 % — AB (ref 11.5–14.5)
WBC: 7.8 10*3/uL (ref 3.6–11.0)

## 2014-10-29 LAB — HEPATIC FUNCTION PANEL A (ARMC)
Albumin: 2.7 g/dL — ABNORMAL LOW
Alkaline Phosphatase: 126 U/L
Bilirubin,Total: 0.6 mg/dL
SGOT(AST): 18 U/L
SGPT (ALT): 12 U/L — ABNORMAL LOW
Total Protein: 5.6 g/dL — ABNORMAL LOW

## 2014-10-29 LAB — BASIC METABOLIC PANEL
ANION GAP: 7 (ref 7–16)
BUN: 6 mg/dL
CALCIUM: 8.2 mg/dL — AB
CHLORIDE: 102 mmol/L
Co2: 28 mmol/L
Creatinine: 0.49 mg/dL
EGFR (Non-African Amer.): >60
Glucose: 86 mg/dL
POTASSIUM: 3.5 mmol/L
SODIUM: 137 mmol/L

## 2014-11-09 ENCOUNTER — Inpatient Hospital Stay
Admit: 2014-11-09 | Disposition: A | Discharge status: Home / Self Care | Payer: Self-pay | Attending: Internal Medicine | Admitting: Internal Medicine

## 2014-11-09 LAB — CBC
HCT: 41.7 % (ref 35.0–47.0)
HGB: 13 g/dL (ref 12.0–16.0)
MCH: 22.7 pg — ABNORMAL LOW (ref 26.0–34.0)
MCHC: 31.1 g/dL — ABNORMAL LOW (ref 32.0–36.0)
MCV: 73 fL — ABNORMAL LOW (ref 80–100)
PLATELETS: 560 10*3/uL — AB (ref 150–440)
RBC: 5.71 10*6/uL — ABNORMAL HIGH (ref 3.80–5.20)
RDW: 19.2 % — AB (ref 11.5–14.5)
WBC: 12.7 10*3/uL — ABNORMAL HIGH (ref 3.6–11.0)

## 2014-11-09 LAB — COMPREHENSIVE METABOLIC PANEL
ALBUMIN: 3.5 g/dL
ANION GAP: 16 (ref 7–16)
AST: 19 U/L
Alkaline Phosphatase: 155 U/L — ABNORMAL HIGH
BUN: 11 mg/dL
Bilirubin,Total: 0.3 mg/dL
CHLORIDE: 96 mmol/L — AB
Calcium, Total: 8.8 mg/dL — ABNORMAL LOW
Co2: 24 mmol/L
Creatinine: 0.86 mg/dL
GLUCOSE: 105 mg/dL — AB
Potassium: 2.9 mmol/L — ABNORMAL LOW
SGPT (ALT): 15 U/L
Sodium: 136 mmol/L
Total Protein: 6.9 g/dL

## 2014-11-09 LAB — DIFFERENTIAL
Basophil #: 0.1 10*3/uL (ref 0.0–0.1)
Basophil %: 0.6 %
EOS ABS: 0 10*3/uL (ref 0.0–0.7)
EOS PCT: 0 %
Lymphocyte #: 0.9 10*3/uL — ABNORMAL LOW (ref 1.0–3.6)
Lymphocyte %: 6.9 %
MONO ABS: 0.6 x10 3/mm (ref 0.2–0.9)
Monocyte %: 4.6 %
NEUTROS PCT: 87.9 %
Neutrophil #: 11.2 10*3/uL — ABNORMAL HIGH (ref 1.4–6.5)

## 2014-11-09 LAB — URINALYSIS, COMPLETE
Bacteria: NONE SEEN
Bilirubin,UR: NEGATIVE
Glucose,UR: NEGATIVE mg/dL (ref 0–75)
NITRITE: NEGATIVE
Ph: 6 (ref 4.5–8.0)
Specific Gravity: 1.018 (ref 1.003–1.030)
Squamous Epithelial: NONE SEEN

## 2014-11-09 LAB — TSH: Thyroid Stimulating Horm: 7.096 u[IU]/mL — ABNORMAL HIGH

## 2014-11-09 LAB — LACTIC ACID, PLASMA: LACTIC ACID, VENOUS: 1.7 mmol/L

## 2014-11-09 LAB — TROPONIN I: Troponin-I: 0.08 ng/mL — ABNORMAL HIGH

## 2014-11-10 LAB — BASIC METABOLIC PANEL
Anion Gap: 8 (ref 7–16)
BUN: 7 mg/dL
CHLORIDE: 107 mmol/L
CREATININE: 0.56 mg/dL
Calcium, Total: 7.8 mg/dL — ABNORMAL LOW
Co2: 24 mmol/L
EGFR (Non-African Amer.): >60
Glucose: 84 mg/dL
Potassium: 3.6 mmol/L
Sodium: 139 mmol/L

## 2014-11-10 LAB — CBC WITH DIFFERENTIAL/PLATELET
BASOS ABS: 0.1 10*3/uL (ref 0.0–0.1)
Basophil %: 1.1 %
Eosinophil #: 0.1 10*3/uL (ref 0.0–0.7)
Eosinophil %: 1.4 %
HCT: 31.4 % — AB (ref 35.0–47.0)
HGB: 9.6 g/dL — ABNORMAL LOW (ref 12.0–16.0)
LYMPHS ABS: 1.2 10*3/uL (ref 1.0–3.6)
Lymphocyte %: 20.1 %
MCH: 22.7 pg — ABNORMAL LOW (ref 26.0–34.0)
MCHC: 30.6 g/dL — ABNORMAL LOW (ref 32.0–36.0)
MCV: 74 fL — ABNORMAL LOW (ref 80–100)
MONOS PCT: 10.5 %
Monocyte #: 0.6 x10 3/mm (ref 0.2–0.9)
Neutrophil #: 3.8 10*3/uL (ref 1.4–6.5)
Neutrophil %: 66.9 %
PLATELETS: 425 10*3/uL (ref 150–440)
RBC: 4.23 10*6/uL (ref 3.80–5.20)
RDW: 18.7 % — ABNORMAL HIGH (ref 11.5–14.5)
WBC: 5.8 10*3/uL (ref 3.6–11.0)

## 2014-11-11 LAB — URINE CULTURE

## 2014-11-14 ENCOUNTER — Ambulatory Visit
Admit: 2014-11-14 | Disposition: A | Discharge status: Home / Self Care | Payer: Self-pay | Attending: Urology | Admitting: Urology

## 2014-11-14 LAB — CULTURE, BLOOD (SINGLE)

## 2014-11-17 ENCOUNTER — Ambulatory Visit
Admit: 2014-11-17 | Disposition: A | Discharge status: Home / Self Care | Payer: Self-pay | Attending: Urology | Admitting: Urology

## 2014-11-18 NOTE — Consult Note (Signed)
Brief Consult Note: Diagnosis: n/v, early satiety.   Patient was seen by consultant.   Consult note dictated.   Recommend further assessment or treatment.   Comments: The biggest problem seems to be the long standing early satiety, malnutrition from the gastrectomy.  She is not interested in feeding tube.    Recs: IV iron while inpatient - will plan for UGI series to r/o gasric outlet obstruction ( suspect this is unlikely) in a week or two once she is feeling up to it ( not ready to undergo currently).  - nutrition consult - will f/u with Owens Sharkawn Harrison in GI clinic in late September.  Electronic Signatures: Dow Adolphein, Linna Thebeau (MD)  (Signed 06-Sep-14 09:26)  Authored: Brief Consult Note   Last Updated: 06-Sep-14 09:26 by Dow Adolphein, Mazin Emma (MD)

## 2014-11-18 NOTE — Consult Note (Signed)
PATIENT NAME:  Ashley Young, Ashley Young MR#:  [id number] DATE OF BIRTH:  12/13/1940  DATE OF CONSULTATION:  04/03/2013  REFERRING PHYSICIAN:  Duane LopeJeffrey D. Judithann SheenSparks, MD CONSULTING PHYSICIAN:  Dow AdolphMatthew Leola Fiore, MD  REASON FOR THE CONSULTATION: Nausea, vomiting, weight loss.   HISTORY OF PRESENT ILLNESS: Ashley Young is a 74 year old female with a complicated past medical history including GI bleed secondary to NSAID use and status post partial gastrectomy, chronic anemia, CVA, osteoporosis and malnutrition, who presented to the hospital for a hip fracture. [facility] when she fell onto the floor. She did unfortunately fracture her hip and is status post a surgical repair.   GI is currently consulted due to ongoing nausea, vomiting and malnutrition. This history is per Ashley Young and her husband. She has been struggling with nausea, vomiting, early satiety and malnutrition for many years. It sounds as though this has been ongoing ever since she had the surgery on her stomach. The biggest problem is that she does get very full shortly after just having several bites. She also gets very full after just having several sips of water. This is not a new problem for her. She also does get some occasional nausea and vomiting. This is very sporadic. She did have 1 episode of vomiting earlier in the day. However, currently, she is not having any nausea whatsoever. Her husband was also concerned because she has not had anything to eat, and earlier in the day she did throw up some pasty material that he felt like was old residual food. He was concerned there could be a blockage in her stomach that would explain why she had old food in her stomach.   She has not had any rectal bleeding or any hematemesis. She is also not having trouble with heartburn or reflux.   PAST MEDICAL HISTORY:  1. GI bleed secondary to NSAID use, status post partial gastrectomy.  2. Chronic  migraines.  3. Osteoporosis.  4. Sciatica.  5. Depression.  6. Anxiety.  7. History of recurrent UTI.  8. Nephrolithiasis.  9. Malnutrition.   PAST SURGICAL HISTORY:  1. Vagotomy and partial gastrectomy.  2. Surgical repair of gastric outlet obstruction.  3. Tonsillectomy.  4. Adenectomy. 5. Cholecystectomy.   6. Appendectomy.  7. Kyphoplasty.   HOME MEDICATIONS:   1. Fluoxetine 10 mg daily.  2. Amitriptyline 50 mg daily.  3. Tramadol 50 mg every 4 hours as needed.  4. Acetaminophen and oxycodone 325/5 one tablet every 6 hours as needed.  5. Protonix 40 mg twice a day.   ALLERGIES: ASPIRIN, NSAIDS AND PENICILLIN.   SOCIAL HISTORY: She denies any tobacco, alcohol or recreational drugs.   FAMILY HISTORY: She is not aware of any family history of gastric cancer or colon cancer or other GI malignancy.   REVIEW OF SYSTEMS: A 10-system review was conducted and negative except as stated in the HPI.   PHYSICAL EXAMINATION:  VITAL SIGNS: Her current temperature is 97.6, pulse is 76, respirations are 18, blood pressure is 96/61, pulse oximetry is 92% on room air. GENERAL: This is an elderly-appearing female, appears older than stated age. Appears severely malnourished and underweight.  HEENT: Normocephalic/atraumatic. Extraocular movements are intact. Anicteric. NECK: Soft, supple. JVP appears normal. No adenopathy. CHEST: Clear to auscultation. No wheeze or crackle. Respirations unlabored. HEART: Regular. No murmur, rub, or gallop. Normal S1 and S2. ABDOMEN: Soft, nontender, nondistended. Normal active bowel sounds in all four quadrants.  No organomegaly. No masses EXTREMITIES: No swelling, well perfused. SKIN: No rash or lesion. Skin color, texture, turgor normal. NEUROLOGICAL: Grossly intact. PSYCHIATRIC: Normal tone and affect. MUSCULOSKELETAL: No joint swelling or erythema.   LABORATORY DATA: Sodium is 140, potassium 3.8, chloride 108, bicarbonate 29, BUN 11, creatinine 0.64.  Her albumin is 1.9, total bilirubin 0.1, alkaline phosphatase 117, AST is 20, ALT is 21. TSH 3.19. Her white count is 7.3, hemoglobin is 10.7, hematocrit is 32, platelets are 327, MCV is 82. Her INR is 1.0.   DATA: She had an upper endoscopy back in July 2014. It revealed esophagitis at the GE junction, which was biopsied and was consistent with reflux esophagitis. 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 She also had altered gastric anatomy from her surgical resection.   ASSESSMENT AND PLAN:   1. Nausea, vomiting, malnutrition: It seems as though she has had issues with early satiety and malnutrition and weight loss ever since her surgery. It seems that due to her reduced gastric size, she is getting filled up very, very quickly. She is not having symptoms that would be suggestive of a gastric outlet obstruction. However, she does have what sounds like pretty severe early satiety. She is already on a PPI. She has tried many measures, including Reglan and antibiotics, which do not seem to help much. She is already trying various nutrition shakes and has been seen by nutrition.   Unfortunately, I do not have a lot in addition to offer to help with this long-standing problem. I think it would be reasonable to obtain an upper GI series to make sure that she does not have another gastric outlet obstruction. However, given the lack of symptoms, there is no urgency to have this done. She is not feeling up to doing this in the next couple days. Therefore, I will propose that we get this study done in the next 2 to 3 weeks when she is feeling stronger and better.   2. Iron deficiency anemia: She was supposed to get an iron transfusion at the [facility] right before she fell and broke her hip. Therefore, I will place her to have an IV iron transfusion today. Then, will also get her another transfusion in 3 days before she leaves the hospital.   In addition, she will follow up with  Owens Shark in our clinic at the end of September.  Thank you for this consult.   ____________________________ Dow Adolph, MD mr:OSi D: 04/03/2013 09:20:58 ET T: 04/03/2013 09:46:18 ET JOB#: [id number]  cc: Dow Adolph, MD, <Dictator> Kathalene Frames MD ELECTRONICALLY SIGNED 04/07/2013 19:11

## 2014-11-18 NOTE — Discharge Summary (Signed)
PATIENT NAME:  Ashley Young, Ashley Young MR#:  [id number] DATE OF BIRTH:  04/12/1941  DATE OF ADMISSION:  03/31/2013 DATE OF DISCHARGE:  04/05/2013  DISCHARGE DIAGNOSES:  1.  Right hip fracture.  2.  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  3.  Reflux esophagitis.  4.  Depression and anxiety.   DISCHARGE MEDICATIONS:  1.  Fluoxetine 10 mg p.o. daily.  2.  Amitriptyline 50 mg p.o. at bedtime.  3.  Tramadol 50 mg p.o. every 4 hours as needed for pain. 4.  Pantoprazole 40 mg p.o. Young.i.d.  5.  Sumatriptan 100 mg as needed for headaches. 6.  Lovenox 30 mg subcutaneous injection once a day.  7.  Magnesium hydroxide 30 mL Young.i.d. as needed for constipation.  8.  Bisacodyl 10 mg rectal suppository as needed for constipation.  9.  Senna as needed.  10. Docusate as needed. 11. Ensure Plus supplement to meals 3 times a day.  12. Calcium and vitamin D 1 tab p.o. Young.i.d. with meals.  13. Acetaminophen 325 mg 2 tabs p.o. q.4 to 6 hours as needed for pain and fever.  14. Oxycodone 5 mg every 4 hours as needed for pain. This is handwritten and in the chart.   CONSULTS: Orthopedics per Dr. Martha ClanKrasinski.   PROCEDURE: Right hip fracture repair per Dr. Martha ClanKrasinski.   PERTINENT LABORATORY AND STUDIES: On day of discharge, sodium 137, potassium 4.6, creatinine 0.56, glucose 91. White blood cell count 9.1, hemoglobin 10.3 and platelets 349.   BRIEF HOSPITAL COURSE:  1.  Right hip fracture. The patient initially came in status post acute right hip fracture and was evaluated by orthopedics per Dr. Martha ClanKrasinski and underwent surgery the following day without any complications. She is being evaluated by physical therapy and will need rehab. Therefore, [facility] is [facility] nursing care and rehab. For her pain, I did give her a handwritten prescription for Oxycodone for pain she is to take as needed. 2.  Low BMI. The patient has a history poor nutritional intake because of abnormal  stomach anatomy due to previous surgeries. She is to supplement her nutrition with Ensure. 3.  Other chronic medical issues remain stable.  DISPOSITION: Stable condition and discharged to [facility]. Follow with Dr. Burnadette PopLinthavong after discharge from the rehab facility and follow up with Dr. Martha ClanKrasinski as directed.  ____________________________ Marisue IvanKanhka Aniya Jolicoeur, MD kl:aw D: 04/05/2013 12:39:18 ET T: 04/05/2013 13:50:42 ET JOB#: [id number]  cc: Marisue IvanKanhka Jaquavian Firkus, MD, <Dictator> Marisue IvanKANHKA Liany Mumpower MD ELECTRONICALLY SIGNED 04/19/2013 10:48

## 2014-11-18 NOTE — Consult Note (Signed)
Brief Consult Note: Diagnosis: Failure to Thrive.  N/V with associated diarrhea.  Dysphagia.  Known history of PUD, history of gastrectomy for bleeding ulcers.  Hypokalemia.  Abnormal GI x-ray results January 28, 2013.   Discussed with Attending MD.   Comments: Patient'Young presentation discussed with Dr. Lutricia FeilPaul Young. Pending ENT evaluation, Dr. Willeen Young consulted.  Concern with possible strictured area and do not want an occurrence of bezoar.  Encourage all medications to be given IV.  Potassium level needs to be corrected.  Will need to plan on proceeding forward with an EGD to allow direct luminal evaluation.   Addendum: Dr. Bluford Young did speak with Dr. Willeen Young.  Evaluation was unremarkble.  No evidence of bolus or pulling of secretion.  Dr. Willeen Young recommending patient being able to advance to clear liquids this evening..  Electronic Signatures: Ashley Young, Ashley Young (NP)  (Signed 25-Jul-14 17:00)  Authored: Brief Consult Note   Last Updated: 25-Jul-14 17:00 by Ashley Young, Ashley Young (NP)

## 2014-11-18 NOTE — Consult Note (Signed)
PATIENT NAME:  Ashley Young, Ashley Young MR#:  [id number] DATE OF BIRTH:  January 25, 1941  DATE OF CONSULTATION:  02/19/2013  CONSULTING:  Payton Emerald, NP  ATTENDING PHYSICIAN: Abel Presto, MD.  REASON FOR CONSULTATION: Dysphagia, nausea, vomiting, diarrhea, significant weight loss.   HISTORY OF PRESENT ILLNESS: Ms. Gabbert is a 74 year old Caucasian female who is known to myself as well as Dr. Verdie Shire; in fact, she was just seen in our office on 02/11/2013. She has a significant past medical history of recurrent GI bleeding secondary to NSAID use, chronic migraine headaches, severe osteoporosis, chronic neuropathic pain, depression, anxiety.   When she was seen on the 17th in our office, her weight was 77 pounds. At that time, there was great concern given her weight along with her presentation of symptoms. She had had a CT scan of abdomen and pelvis with contrast that was done on 01/28/2013 for the indication of left flank pain, findings of a large amount of stool in the colon suggestive of constipation, a distended villus in the upper abdomen likely reflecting the stomach or a loop of the colon suggested that the contents were stool, but possibly gastric contents or a bezoar.   EGD was done with Dr. Verdie Shire on 07/18/2013 with findings of LA grade C reflux esophagitis, difficult with anatomy due to multiple surgeries. No real outlet from the stomach was noted and one tiny opening which may be an outlet.   Dilatation was performed with a 12 mm balloon without much success. He was unable to get the scope further down into the intestines.   Endoscopic history is also significant for colonoscopy which was done by Dr. Gaylyn Cheers on 06/09/2006. Colon was extremely spastic and torturous. Scope was passed with great difficulty; in fact, he was unable to get beyond the ascending colon. Internal hemorrhoids were noted. Barium enema or observe was mentioned at that time, and it appears that observation was  the choice as I am unable to locate a barium enema result.   Weight has been very significant in being lost over the past year. States that her highest weight was around 100 pounds. She does experience nausea when she overeats, vomiting 2 to 4 times a month. Pain to upper abdomen noted while she is lying on her right side, states it is better on her left. Very careful what she eats and continues with presentation of constipation. Bowels move every 2 to 3 days, intermittent water brash, Zantac as needed.   The patient was recommended to have upper endoscopy done this week but patient was presenting for a family vacation at the beach and she opted to proceed forward. Unfortunately, at the beach Monday, she had eaten some shrimp and since that time she has not been feeling very well.   Yesterday, her symptoms were extremely exacerbated with intense nausea and vomiting persistently, dry heaving.   History obtained mostly from husband who is present; as the patient unfortunately swallowed a  20 mEq potassium tablet an hour ago and it is hung, feeling to her cricopharyngeal area. She has not been able to take liquid to get it to pass. She has been spitting up saliva.   Additionally yesterday, diarrhea described as dark brown, pasty. She has not had any other vomiting since she has been home, but again, nausea remains. Diarrhea has also subsided. She denies any abdominal pain and no fevers at this time.   She had notified our office this morning of the severity of her  symptoms and was recommended to present to the Emergency Room for emergent evaluation.   Chemistry panel revealed BUN of 24, sodium 135, potassium 2.9, calcium 7.9. Otherwise within normal limits. Hepatic panel revealed total protein 6.0, albumin 2.4, alk phos elevated at 309 with an ALT of 122 and an AST 201. Troponin is elevated at 0.13. CBC within normal limits  except RDW was 15.9 with lymphocyte number low at 0.4. PT is 15.2 with an INR of  1.2.   Urinalysis revealed +1 ketones, 2+ blood, 100 mg/dL of protein. RBC is 12 per high-power field with WBC of 5 per high-power field. Single view of chest reveals hyperinflation consistent with COPD, more pronounced from prior study, 04-03-2011.   PAST MEDICAL HISTORY: Recurrent GI bleeding secondary to NSAID use, chronic migraine headaches, severe osteoporosis, sciatica with chronic neuropathic pain, depression, anxiety, recurrent urinary tract infections, nephrolithiasis.   PAST SURGICAL HISTORY: Vagotomy; partial gastrectomy with a repeat gastrectomy surgery for gastric outlet obstruction; C-section x 2; breast biopsy; tonsillectomy and adenoidectomy; bursa removal, left hip; and kyphoplasty, Dr. Rudene Christians.   HOME MEDICATIONS:  Ultram 50 mg 1 to 2 tablets by mouth every 4 to 6 hours as needed, amitriptyline 50 mg 1 tablet at night as needed, Prozac 10 mg once a day, Osphena tablets 60 mg once a day. It looks like tamsulosin 0.4 mg 1 capsule once a day. MiraLax 17 grams as directed as needed. Sumatriptan succinate tablet 100 mg once a day as directed as needed.   ALLERGIES: SULFA, PENICILLIN, NSAIDS AND FERROUS GLUCONATE.  REVIEW OF SYSTEMS: All 10 systems reviewed and checked. Otherwise unremarkable unless  stated above.   FAMILY HISTORY: Noncontributory.   SOCIAL HISTORY: No tobacco. No alcohol use.   PHYSICAL EXAMINATION:  VITAL SIGNS: Temperature 97.8 with a pulse of 50. Respirations are 16. Blood pressure is 177/61.  GENERAL: Well-developed, very slender, cachectic female. No acute distress noted though she does appear mildly uncomfortable in the aspect of a tablet being hung at this point. Her husband present at bedside.  HEENT: Normocephalic, atraumatic. Pupils equal, reactive to light. Conjunctivae clear. Sclerae anicteric.  NECK: Supple. Trachea midline. No lymphadenopathy. No thyromegaly.  PULMONARY: Symmetric rise and fall of chest. Clear to auscultation throughout.   CARDIOVASCULAR: Regular rate and rhythm. S1, S2. No murmurs. No gallops.  ABDOMEN: Flat. Very hypoactive bowel sounds. No bruits. No masses. No evidence of hepatosplenomegaly.  RECTAL: Deferred.  MUSCULOSKELETAL: Moving all 4 extremities. No contractures. No clubbing.  EXTREMITIES: No edema.  PSYCH: Alert and oriented x 4. Memory grossly intact. Appropriate affect and mood.  NEUROLOGICAL: No gross neurological deficits.   LABORATORY, DIAGNOSTIC AND RADIOLOGICAL DATA: See laboratory results as noted under H and P.  Cardiopulmonary: Lactic acid elevated at 2.4.    IMPRESSION:  Dehydration, hypokalemia, acute diarrhea with associated nausea and vomiting, known history of a gastrointestinal bleed due to nonsteroidal antiinflammatory use in the past, history of peptic ulcer disease status post partial gastrectomy as well as evidence of gastric outlet obstruction on esophagogastroduodenoscopy 07/18/2010. Elevation in liver function tests. Unclear etiological cause. Dysphagia, known history of depression and elevated troponin level.   PLAN:  The patient's presentation was discussed with Dr. Verdie Shire. At this time awaiting ENT evaluation, Dr. Richardson Landry, for the concern of a potassium tablet being hung to cricopharyngeal area. Concern that patient does not develop a bezoar as noted of concern on previous CT scan. Potassium does need to be corrected. I would avoid giving potassium by  mouth. Would supplement IV at this point. In fact, we would make all medications, if possible, IV form. If potassium level is able to be corrected by tomorrow, I will consider proceeding forward with upper endoscopy to allow direct luminal evaluation given her history.   Recommend PPI therapy as ordered, Protonix 40 mg IV every 12 hours.   Pending stool study results, specifically C. diff.   We will closely monitor the patient during her hospitalization.   These services provided by Ebony Cargo, NP, under collaborative  agreement with Dr. Verdie Shire.   ____________________________ Payton Emerald, NP dsh:np D: 02/19/2013 16:50:01 ET T: 02/19/2013 20:01:27 ET JOB#: [id number]  cc: Payton Emerald, NP, <Dictator> Payton Emerald MD ELECTRONICALLY SIGNED 03/02/2013 7:53

## 2014-11-18 NOTE — H&P (Signed)
PATIENT NAME:  [name], Ashley Young#:  [id number] DATE OF BIRTH:  13-Oct-1940  DATE OF ADMISSION:  03/31/2013  Addendum  Right hip fracture. The patient may need orthopedic surgery. Overall, the patient is at moderate risk in peri- and postoperative because of her chronic malnutrition and osteoporosis and chronic anemia. The patient is currently optimized medically to undergo surgery, and we will continue monitoring her during preoperative and postoperative period. Possible surgery by orthopedic doctors tomorrow. Plan explained to the patient's husband in room.   ____________________________ Hope PigeonVaibhavkumar G. Elisabeth PigeonVachhani, MD vgv:dmm D: 03/31/2013 19:35:46 ET T: 03/31/2013 20:31:45 ET JOB#: [id number]  cc: Hope PigeonVaibhavkumar G. Elisabeth PigeonVachhani, MD, <Dictator> Altamese DillingVAIBHAVKUMAR Leon Goodnow MD ELECTRONICALLY SIGNED 04/03/2013 15:42

## 2014-11-18 NOTE — Consult Note (Signed)
Chief Complaint:  Subjective/Chief Complaint Pt feels better. Going home today. Tolerated liquids.   VITAL SIGNS/ANCILLARY NOTES: **Vital Signs.:   27-Jul-14 09:00  Vital Signs Type Routine  Temperature Temperature (F) 97.8  Celsius 36.5  Pulse Pulse 84  Respirations Respirations 16  Systolic BP Systolic BP 828  Diastolic BP (mmHg) Diastolic BP (mmHg) 62  Mean BP 80  Pulse Ox % Pulse Ox % 97  Pulse Ox Activity Level  At rest  Oxygen Delivery Room Air/ 21 %   Brief Assessment:  GEN no acute distress   Cardiac Regular   Respiratory clear BS   Lab Results: Hepatic:  26-Jul-14 05:28   Bilirubin, Total 0.3  Alkaline Phosphatase  245  SGPT (ALT)  107  SGOT (AST)  158  Total Protein, Serum  5.1  Albumin, Serum  2.1  Routine Chem:  26-Jul-14 05:28   Result Comment TROPONIN - RESULTS VERIFIED BY REPEAT TESTING.  - PREVIOUSLY CALLED AT 1408 02/19/13  - BY CDF- LAB  Result(s) reported on 20 Feb 2013 at 06:28AM.  Glucose, Serum  43  BUN  19  Creatinine (comp)  0.56  Sodium, Serum 141  Potassium, Serum  3.3  Chloride, Serum  110  CO2, Serum  20  Calcium (Total), Serum  7.1  Osmolality (calc) 280  eGFR (African American) >60  eGFR (Non-African American) >60 (eGFR values <58mL/min/1.73 m2 may be an indication of chronic kidney disease (CKD). Calculated eGFR is useful in patients with stable renal function. The eGFR calculation will not be reliable in acutely ill patients when serum creatinine is changing rapidly. It is not useful in  patients on dialysis. The eGFR calculation may not be applicable to patients at the low and high extremes of body sizes, pregnant women, and vegetarians.)  Anion Gap 11  Cardiac:  26-Jul-14 05:28   Troponin I  0.10 (0.00-0.05 0.05 ng/mL or less: NEGATIVE  Repeat testing in 3-6 hrs  if clinically indicated. >0.05 ng/mL: POTENTIAL  MYOCARDIAL INJURY. Repeat  testing in 3-6 hrs if  clinically indicated. NOTE: An increase or decrease  of 30% or more on serial  testing suggests a  clinically important change)  Routine Hem:  26-Jul-14 05:28   WBC (CBC) 5.7  RBC (CBC) 4.62  Hemoglobin (CBC) 12.2  Hematocrit (CBC) 37.7  Platelet Count (CBC)  111  MCV 82  MCH 26.5  MCHC 32.4  RDW  16.0  Neutrophil % 90.2  Lymphocyte % 5.2  Monocyte % 3.4  Eosinophil % 0.9  Basophil % 0.3  Neutrophil # 5.1  Lymphocyte #  0.3  Monocyte # 0.2  Eosinophil # 0.0  Basophil # 0.0 (Result(s) reported on 20 Feb 2013 at 05:52AM.)   Assessment/Plan:  Assessment/Plan:  Assessment Anorexia. Wt loss. N/V/D has stopped. Reflux esophagitis. GE junction stricture-dilated. No gastric outlet obstruction at this point. Pt does not want any surgery. LFT gradually improving.   Plan Ok to discharge. Make sure patient takes nutritional supplements aggressively. Moniter LFT. Needs f/u with Ebony Cargo at our office in 1-2 weeks. Thanks.   Electronic Signatures: Verdie Shire (MD)  (Signed 27-Jul-14 10:52)  Authored: Chief Complaint, VITAL SIGNS/ANCILLARY NOTES, Brief Assessment, Lab Results, Assessment/Plan   Last Updated: 27-Jul-14 10:52 by Verdie Shire (MD)

## 2014-11-18 NOTE — Op Note (Signed)
PATIENT NAME:  Ashley Young, Ashley Young MR#:  [id number] DATE OF BIRTH:  25-Sep-1940  DATE OF PROCEDURE:  04/01/2013  PREOPERATIVE DIAGNOSIS: Right valgus impacted femoral neck hip fracture.  POSTOPERATIVE DIAGNOSIS: Right valgus impacted femoral neck hip fracture.   PROCEDURE PERFORMED: Percutaneous fixation of right femoral neck hip fracture with 7.3 mm cannulated screws x 3.   SURGEON: Juanell Fairly, MD.   ANESTHESIA: Spinal.   ESTIMATED BLOOD LOSS: 25 mL   COMPLICATIONS: None.   INDICATIONS FOR PROCEDURE: Ashley Young sustained a fall on 03/30/2013 at the [facility]. She was able to go home following the injury but had significant pain at home and returned to [facility] the following day. She was then diagnosed with a femoral neck hip fracture and sent to the Emergency Department for admission. I had reviewed the patient's x-rays. Given that she has a valgus impacted femoral neck hip fracture, I have recommended cannulated screw fixation for this. I reviewed the details of the procedure, as well as the risks and benefits of surgery, with the patient and her husband who was with her on admission. They agreed with the plan for surgery.   PROCEDURE NOTE: The patient was brought to the operating room where she underwent a spinal anesthetic by the anesthesia service. I had marked the right leg with the word "yes" according to the hospital's right site protocol. The patient was then positioned on a fracture table. The right leg was placed in a legholder and was in full extension. The left leg was placed in a hemi-lithotomy position. Given there is a valgus impacted femoral neck hip fracture, closed reduction and traction was not necessary. The position of the fracture was confirmed on AP and lateral C-arm images. The patient was then prepped and draped in a sterile fashion.   A timeout was performed to verify the patient's name, date of birth, medical  record number, correct site of surgery and correct procedure to be performed. It was also used to verify the patient received antibiotics and all appropriate instruments, implants and radiographic studies were available in the room. Once all in attendance were in agreement, the case began.   C-arm images were taken with a drill pin to confirm the correct trajectory of the cannulated screws. The proposed incisions were drawn out with a surgical marker based upon the C-arm images. An incision was made laterally over the right hip. A deep #10 blade was used to incise the fascia lata and vastus lateralis fascia. The greater trochanter was palpated. A threaded drill pin was then placed alongside the lateral cortex of the femur. C-arm images taken to confirm adequate starting position. The cannulated drill pin was then advanced across the fracture and into the femoral neck. This was performed using a freehand technique. Two additional threaded guide pins were then placed superior to this initial pin which was along the inferior femoral neck. The additional 2 pins were placed in parallel, creating an inverted triangle formation for the pins. Once all 3 guide pins were in adequate position in AP and lateral images, they were measured for depth. There were two 80 mm 7.3 screws, as well as one 85. These were then overdrilled and the 3 cannulated screws were placed into position across the fracture site by hand. C-arm images were taken in AP and lateral projections, as well as fluoro was used to confirm adequate position of the cannulated screws. Care was taken to make sure that there was no  penetration of the screws into the femoral head. Final C-arm images were taken. The wound was then copiously irrigated with saline. The fascia lata was closed with interrupted 0 Vicryl, the subcutaneous tissue closed with 2-0 Vicryl and the skin approximated with skin staples. A dry sterile dressing was applied. I was scrubbed and  present for the entire case and all sharp and instrument counts were correct at the conclusion of the case. The patient was then transferred to a hospital bed and brought to the PACU in stable condition. I spoke with the patient's husband in the consultation room to let them know that the case had gone without complication, and the patient was stable in the recovery room. I shared with him the final C-arm images from the case.   ____________________________ Kathreen DevoidKevin L. Jermarcus Mcfadyen, MD klk:ce D: 04/01/2013 13:42:48 ET T: 04/01/2013 14:12:00 ET JOB#: [id number]  cc: Kathreen DevoidKevin L. Margherita Collyer, MD, <Dictator> Kathreen DevoidKEVIN L Ludie Pavlik MD ELECTRONICALLY SIGNED 04/04/2013 23:18

## 2014-11-18 NOTE — Consult Note (Signed)
PATIENT NAME:  Ashley Young, BIN MR#:  [id number] DATE OF BIRTH:  Dec 22, 1940  DATE OF CONSULTATION:  02/19/2013  CONSULTING PHYSICIAN:  Ollen Gross. Willeen Cass, MD  REQUESTING PHYSICIAN:  Emergency Room  REASON FOR CONSULTATION:  Possible pill lodged in throat.  HISTORY OF PRESENT ILLNESS:  A 74 year old female with a history of swallowing issues and gastrointestinal problems, followed by Dr. Bluford Kaufmann, presented to the Emergency Room with dehydration, hypotension, and generalized weakness. Her potassium was found to be low. She has a known history of stomach ulcers, prior partial gastrectomy and vagotomy, as well as reflux. The patient feels like there is a pill still lodged in her throat, and points towards the region of her larynx. She is talking and not having any difficulty breathing.  PAST MEDICAL HISTORY:  History of GI bleed, osteoporosis, osteoarthritis, hyperlipidemia, neuralgia, nephrolithiasis, depression, migraines, anemia.   MEDICATIONS:  Tramadol 50 mg p.o. q. 4 hours p.r.n. pain, fluoxetine 10 mg p.o. daily, amitriptyline 50 mg p.o. daily.   ALLERGIES: ASPIRIN and NONSTEROIDALS caused bleeding. IRON and VENOFER caused rash. PENICILLIN caused swelling.   SOCIAL HISTORY:  She is a prior smoker in the past. She does not drink alcohol.   PAST SURGICAL HISTORY:  Bursa removal of the left hip, tonsillectomy and adenoidectomy, breast biopsy, previous C-section, and partial gastrectomy with vagotomy.  REVIEW OF SYSTEMS:  She has not had any recent illnesses. She does have intermittent nausea, vomiting, diarrhea. She has had some mild dizziness since she became more weak. She denies any palpitations or difficulty breathing.   PHYSICAL EXAMINATION: VITAL SIGNS:  Temperature is 97.8, pulse 92, blood pressure is 124/57.  GENERAL EXAM:  A thin, elderly female in no acute distress, with no evidence of stridor. There is no hoarseness.  HEAD AND FACE EXAM:  Head is normocephalic, atraumatic. No facial  skin lesions. Facial strength is normal and symmetric.  EAR EXAM:  External ears, ear canals, tympanic membranes are clear bilaterally. There is no middle ear effusion or infection.  NASAL EXAM:  External nose is unremarkable. Nasal cavity is clear, with no purulence or polyps. The septum deviates mildly towards the right.  ORAL CAVITY AND OROPHARYNX:  She is wearing dentures. The tongue, floor of mouth, posterior pharynx are clear, with no erythema, exudate or swelling.  NECK EXAM:  The neck is supple, without adenopathy or mass. There is no thyromegaly. Salivary glands are soft and nontender, without masses.  NEUROLOGIC EXAM:  Cranial nerves II through XII are grossly intact.   PROCEDURE:  Fiber-optic nasal laryngoscopy was performed. The hypopharynx, larynx and tongue base were carefully inspected. Vocal cords are clear and mobile, and there is no evidence of any foreign body in the hypopharynx. I could visualize the piriform sinuses quite well, and there is not even any pooling of secretions there.   ASSESSMENT:  No evidence of foreign body in the hypopharynx of this patient. Certainly I cannot rule out an esophageal foreign body, but given that this was a pill, it would be likely to dissolve. I do not see any pooling of secretions; however, I think it is unlikely she has a lodged pill in the esophagus. I talked with Dr. Bluford Kaufmann, and he will follow her from here, as he has a long relationship with the patient. Will try having her resume a liquid diet. Certainly if she is not able to tolerate a liquid diet and continues to have significant dysphagia, then may consider going ahead with an upper GI endoscopy.  ____________________________ Ollen GrossPaul S. Willeen CassBennett, MD psb:mr D: 02/19/2013 17:02:53 ET T: 02/19/2013 20:56:09 ET JOB#: [id number]  cc: Ollen GrossPaul S. Willeen CassBennett, MD, <Dictator> Sandi MealyPAUL S Srah Ake MD ELECTRONICALLY SIGNED 02/24/2013 7:28

## 2014-11-18 NOTE — Discharge Summary (Signed)
PATIENT NAME:  Ashley Young, Ashley Young MR#:  [id number] DATE OF BIRTH:  27-Feb-1941  DATE OF ADMISSION:  02/19/2013 DATE OF DISCHARGE:  02/21/2013  DISCHARGE DIAGNOSES: 1.  Nausea, vomiting, diarrhea; resolved.  2.  Malnutrition; failure to thrive.  3.  Reflux esophagitis.  4.  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 Bluford Kaufmannh.   DISCHARGE MEDICATIONS: 1.  Fluoxetine 10 mg p.o. daily.  2.  Amitriptyline 50 mg p.o. at bedtime.  3.  Tramadol 50 mg q.4 hours as needed for pain.  4.  Acetaminophen/oxycodone 325/5, 1 tablet p.o. q.4-6 hours as needed for pain.  5.  Pantoprazole 40 mg p.o. Young.i.d.   CONSULTS: GI and cardiology.   PROCEDURE: Had an EGD performed which showed reflux esophagitis, and also a stricture at the GE junction, and underwent dilatation to 18 mm. Also showed stomach anatomy that was  abnormal due to multiple surgeries.   PERTINENT LABS: On the day prior to discharge did have a troponin of 0.1, and then subsequently troponin was 0.07.   Sodium of 141, potassium 3.3, creatinine 0.56, AST of 158, ALT of 107, alkaline phosphatase of 245, total bilirubin of 0.3, albumin level of 2.1.   White blood cell count 5.7, hemoglobin 12.2, platelets of 111.   The patient underwent an echocardiogram that showed a normal EF and normal left ventricular function.     Hepatitis panels were completed and all were negative. Urine culture showed no growth. Urinalysis did show 2+ blood, and no leuk esterase.   BRIEF HOSPITAL COURSE:  1.  Nausea, vomiting, diarrhea. The patient initially was admitted due to these symptoms, which quickly resolved.  2.  Malnutrition and failure to thrive: The patient has had significant weight loss, with precursor to low BMI prior. She reports early satiety. Upon evaluation this seems to be a chronic issue where she is able to take only small amounts at a time. While in the hospital she was evaluated by GI. Full workup was essentially  negative for hepatitis, even though she had transaminitis. Upon the evaluation with an EGD, this did show reflux esophagitis and stricture at the GE junction which was dilated to 18 mm. It also showed abnormal stomach anatomy because of previous surgeries. Recommendation per Dr. Bluford Kaufmannh is to be on a clear liquid diet and advance to a full-liquid diet, if tolerated, and twice-a-day PPIs. Will place her on pantoprazole 40 mg twice a day. I anticipate that this will be a chronic issue for her because of the anatomy of her stomach. We will supplement caloric intake as-is.  3.  Chronic back pain: Plan to treat with tramadol, amitriptyline, and Percocet if needed at this time. She understands the risk and benefits, and she has no signs of abuse.   DISPOSITION: She is in stable condition and will be discharged to home. She will follow up with Dr. Burnadette PopLinthavong in 10 days. Follow with Dr. Sandria ManlyGI per Owens Sharkawn Harrison in 2 weeks.    ____________________________ Marisue IvanKanhka Staci Carver, MD kl:dm D: 02/21/2013 09:20:29 ET T: 02/21/2013 09:59:42 ET JOB#: [id number]  cc: Marisue IvanKanhka Khalessi Blough, MD, <Dictator> Marisue IvanKANHKA Charly Holcomb MD ELECTRONICALLY SIGNED 02/22/2013 11:03

## 2014-11-18 NOTE — Consult Note (Signed)
PATIENT NAME:  Ashley Young, Ashley B MR#:  [id number] DATE OF BIRTH:  August 15, 1940  DATE OF CONSULTATION:  02/20/2013  CONSULTING PHYSICIAN:  Marcina MillardAlexander Charise Leinbach, MD  PRIMARY CARE PHYSICIAN:  Dr. Burnadette PopLinthavong.   CHIEF COMPLAINT: Nausea, poor p.o. intake.   REASON FOR CONSULTATION: Evaluation of elevated troponin   HISTORY OF PRESENT ILLNESS: The patient is a 74 year old female who was admitted with nausea, decreased p.o. intake, dehydration, and elevated troponin. The patient has significant past GI history with history of peptic ulcer disease, GI bleeding, partial gastrectomy, dysphasia and overall failure to thrive with low body weight followed by Dr. Bluford Kaufmannh. [place] [facility] [place] and experienced nausea with decreased p.o. intake. [facility] with admission labs consistent with dehydration and  admission labs notable for BUN and creatinine of 24 and 0.89, respectively. Potassium was 2.9. Troponin was 0.13. The patient does complain of some atypical right-sided chest discomfort which is minimal. EKG was nondiagnostic. The patient has undergone previous cardiac catheterization, 05/01/2011 by Dr. Preston Fleetingharcot Khan, which revealed insignificant coronary artery disease.   PAST MEDICAL HISTORY: 1.  Insignificant coronary artery disease or prior cardiac catheterization 05/01/2011.  2.  History of peptic ulcer disease. 3.  GI bleed, partial gastrectomy and vagotomy.  4.  Status post cholecystectomy.   MEDICATIONS: Amitriptyline 50 mg at bedtime, Fluoxetine 10 mg daily, Tramadol 50 mg q. 4.   SOCIAL HISTORY: The patient is married. She lives with her husband. She denies tobacco abuse.   FAMILY HISTORY: No immediate family history of coronary disease or myocardial infarction.   REVIEW OF SYSTEMS:  CONSTITUTIONAL: The patient has some mild fever.  EYES: No blurry vision.  EARS: No hearing loss.  RESPIRATORY: No shortness of breath.   CARDIOVASCULAR: Some mild right-sided chest discomfort unrelated to exertion.  GASTROINTESTINAL: The patient has had persistent nausea with decreased p.o. intake.  GENITOURINARY: No dysuria or hematuria.  ENDOCRINE: No polyuria or polydipsia.  MUSCULOSKELETAL: No arthralgias or myalgias.  NEUROLOGICAL: No focal muscle weakness or numbness.  PSYCHOLOGICAL: No depression or anxiety.   PHYSICAL EXAMINATION: VITAL SIGNS: Blood pressure 120/60, pulse 92, respirations 16, temperature 97.6, pulse ox  99%.  HEENT: Pupils equal, reactive to light and accommodation.  NECK: Supple without thyromegaly.  LUNGS: Clear.  HEART: Normal JVP. Normal PMI. Regular rate and rhythm. Normal S1, S2. No appreciable gallop, murmur, or rub.  ABDOMEN: Soft and nontender. Pulses were intact bilaterally.  MUSCULOSKELETAL: Normal muscle tone.  NEUROLOGIC: The patient is alert and oriented x3. Motor and sensory both grossly intact.   IMPRESSION: A 74 year old female with known insignificant coronary artery disease by prior cardiac catheterization who presents with obvious dehydration due to acute decreased p.o. intake with borderline elevated troponin, atypical chest pain and nondiagnostic EKG. Elevated troponin likely due to demand supply ischemia and not due to non-ST elevation myocardial infarction.   RECOMMENDATIONS: 1.  Agree with overall current therapy.  2.  Would defer full dose anticoagulation.  3.  Would defer further cardiac diagnostics at this time.  4.  Agree with upper endoscopy today.    ____________________________ Marcina MillardAlexander Alhaji Mcneal, MD ap:dp D: 02/20/2013 10:08:38 ET T: 02/20/2013 10:24:10 ET JOB#: [id number]  cc: Marcina MillardAlexander Yovani Cogburn, MD, <Dictator> Marcina MillardALEXANDER Finnlee Silvernail MD ELECTRONICALLY SIGNED 03/24/2013 15:58

## 2014-11-18 NOTE — Consult Note (Signed)
EGD showed signif reflux esophagitis though not as severe as in 2011 with GE junction stricture. Stricture dilated to 18mm. Again, stomach has strange anatomy due to multiple surgeries. Like before, unable to find the outlet from stomach to small intestine. Will start clear liquid and advance to full liquids. Would benefit from nutritional supplements like ensure or sustacal. Also, recommend keeping patient on protonix bid or PPI bid in general. Thanks.  Electronic Signatures: Lutricia Feilh, Anjana Cheek (MD)  (Signed on 26-Jul-14 11:45)  Authored  Last Updated: 26-Jul-14 11:45 by Lutricia Feilh, Caycee Wanat (MD)

## 2014-11-18 NOTE — Consult Note (Signed)
Chief Complaint:  Subjective/Chief Complaint See Dawn Harrison's notes. No more chest pressure after KCl ingestion. Able to clear secretions overnight. Some back pain. LFT coming down. U/S of liver normal. Persistent dysphagia, poor oral intake, and wt loss. Only weighs 70lbs now. N/V/D has stopped.   VITAL SIGNS/ANCILLARY NOTES: **Vital Signs.:   26-Jul-14 06:55  Vital Signs Type Pre Medication  Systolic BP Systolic BP 350  Diastolic BP (mmHg) Diastolic BP (mmHg) 60  Mean BP 80   Brief Assessment:  GEN no acute distress   Cardiac Regular   Respiratory clear BS   Gastrointestinal Normal   Lab Results: Hepatic:  26-Jul-14 05:28   Bilirubin, Total 0.3  Alkaline Phosphatase  245  SGPT (ALT)  107  SGOT (AST)  158  Total Protein, Serum  5.1  Albumin, Serum  2.1  Routine Chem:  26-Jul-14 05:28   Result Comment TROPONIN - RESULTS VERIFIED BY REPEAT TESTING.  - PREVIOUSLY CALLED AT 1408 02/19/13  - BY CDF- LAB  Result(s) reported on 20 Feb 2013 at 06:28AM.  Glucose, Serum  43  BUN  19  Creatinine (comp)  0.56  Sodium, Serum 141  Potassium, Serum  3.3  Chloride, Serum  110  CO2, Serum  20  Calcium (Total), Serum  7.1  Osmolality (calc) 280  eGFR (African American) >60  eGFR (Non-African American) >60 (eGFR values <53mL/min/1.73 m2 may be an indication of chronic kidney disease (CKD). Calculated eGFR is useful in patients with stable renal function. The eGFR calculation will not be reliable in acutely ill patients when serum creatinine is changing rapidly. It is not useful in  patients on dialysis. The eGFR calculation may not be applicable to patients at the low and high extremes of body sizes, pregnant women, and vegetarians.)  Anion Gap 11  Cardiac:  26-Jul-14 05:28   Troponin I  0.10 (0.00-0.05 0.05 ng/mL or less: NEGATIVE  Repeat testing in 3-6 hrs  if clinically indicated. >0.05 ng/mL: POTENTIAL  MYOCARDIAL INJURY. Repeat  testing in 3-6 hrs if  clinically  indicated. NOTE: An increase or decrease  of 30% or more on serial  testing suggests a  clinically important change)  Routine Hem:  26-Jul-14 05:28   WBC (CBC) 5.7  RBC (CBC) 4.62  Hemoglobin (CBC) 12.2  Hematocrit (CBC) 37.7  Platelet Count (CBC)  111  MCV 82  MCH 26.5  MCHC 32.4  RDW  16.0  Neutrophil % 90.2  Lymphocyte % 5.2  Monocyte % 3.4  Eosinophil % 0.9  Basophil % 0.3  Neutrophil # 5.1  Lymphocyte #  0.3  Monocyte # 0.2  Eosinophil # 0.0  Basophil # 0.0 (Result(s) reported on 20 Feb 2013 at 05:52AM.)   Assessment/Plan:  Assessment/Plan:  Assessment N/V/D has stopped. However, dysphagia persists. LFT coming down. Looks like KCl tablet has passed.   Plan Discussed EGD with patient. Originially scheduled for 8/7. However, with recent events, recommend proceeding with EGD today.   Electronic Signatures: Verdie Shire (MD)  (Signed 26-Jul-14 09:10)  Authored: Chief Complaint, VITAL SIGNS/ANCILLARY NOTES, Brief Assessment, Lab Results, Assessment/Plan   Last Updated: 26-Jul-14 09:10 by Verdie Shire (MD)

## 2014-11-18 NOTE — Consult Note (Signed)
Brief Consult Note: Diagnosis: Right valgus impacted femoral neck hip fracture.   Patient was seen by consultant.   Recommend to proceed with surgery or procedure.   Orders entered.   Comments: Patient is a 74 year old female who fell yesterday at [facility].  She was able to stand and go home after the fall but had significant pain overnight.  She returned to [facility] today.  She was diagnosed with a femoral neck hip fracture and sent to the ED for admission.  I have reviewed the admission H&P and the patient's medical history.  On exam, the patient has intact skin.  She has no significant shortening or rotation.  Her sensory, motor and vascular function in the right lower extremity is intact.  Her radiographs demonstrate a valugus impacted femoral neck hip fracture.  I have met with the patient and her husband and explained to them the injury and proposed treatment.  I have recommended percutaneous fixation of the right hip.  We discussed the surgery and post-op course.  I used the white board in the patient's room to illustrate the injury and surgery.  Patient is NPO.  I have reviewed all labs and radiographs.  I have answered all the patient's questions.   Surgery is scheduled for tomorrow.  Electronic Signatures: Thornton Park (MD)  (Signed 04-Sep-14 00:05)  Authored: Brief Consult Note   Last Updated: 04-Sep-14 00:05 by Thornton Park (MD)

## 2014-11-18 NOTE — Op Note (Signed)
PATIENT NAME:  Ashley Young, Ashley Young MR#:  [id number] DATE OF BIRTH:  04/13/1941  DATE OF PROCLamount CrankerDURE:  02/19/2013  PREOPERATIVE DIAGNOSIS:  Dysphagia with possible foreign body in the throat.   POSTOPERATIVE DIAGNOSIS:  Dysphagia.   PROCEDURE:  Flexible fiber-optic nasal laryngoscopy.   SURGEON:  Scott S. Willeen CassBennett, M.D.   ANESTHESIA:  None.   INDICATIONS:  This patient has felt like there is a pill lodged in her throat since attempting to swallow a potassium pill earlier today.  Feels like it is in the region of the mid neck.   DESCRIPTION OF PROCEDURE:  After discussing the procedure with the patient, the scope was gently passed through the left nasal cavity.  The nasopharynx was found to be clear.  The scope was passed into the region of the hypopharynx, larynx and tongue base.  These areas were carefully examined and were found to be completely clear.  I had her insufflate her pharynx for better visualization of the piriform sinus.  There is no pooling of secretions or foreign body seen.  Vocal cords are clear and mobile.  I do not see any tears or any extreme swelling of the mucosa.  Visualization was excellent.  The scope was withdrawn.  The patient tolerated the procedure well.    ____________________________ Ollen GrossPaul S. Willeen CassBennett, MD psb:ea D: 02/19/2013 17:04:32 ET T: 02/20/2013 00:57:43 ET JOB#: [id number]  cc: Ollen GrossPaul S. Willeen CassBennett, MD, <Dictator> Sandi MealyPAUL S Garrison Michie MD ELECTRONICALLY SIGNED 02/24/2013 7:28

## 2014-11-18 NOTE — Op Note (Signed)
PATIENT NAME:  Ashley Young, Ashley Young MR#:  [id number] DATE OF BIRTH:  09-23-1940  DATE OF PROCEDURE:  12/16/2012  PREOPERATIVE DIAGNOSIS:  L5 compression fracture.   POSTOPERATIVE DIAGNOSIS:  L5 compression fracture.   PROCEDURE:  L5 biopsy and kyphoplasty.   ANESTHESIA:  MAC.   SURGEON:  Leitha SchullerMichael J. Zariel Capano, MD   DESCRIPTION OF PROCEDURE:  The patient was brought to the operating room, and after adequate anesthesia was obtained, the patient was placed prone with C-arm brought in. Very visualization of L5 was obtained in both AP and lateral projections. The appropriate timeout procedure was carried out, and the local anesthetic was infiltrated subcutaneously, 5 mL on both sides subcutaneously. The back was then prepped and draped in the usual sterile fashion. A repeat timeout procedure carried out. Local anesthetic was then infiltrated down to the pedicle on both sides, approximately 10 mL of 0.5% mixture of Xylocaine and Sensorcaine with epinephrine. The right side was then approached with a stab incision, and the trocar introduced into the vertebral body into the midline. The balloon was inflated, and there was very good central fill. With this completed, the cement was mixed, and approximately 4 mL of cement was inserted with very good fill traversing the fracture lines without extravasation. After the cement had set, trocar was removed, and permanent C-arm views were obtained. The wound was dressed with Dermabond and a Band-Aid, and the patient was sent to the recovery room in stable condition.   ESTIMATED BLOOD LOSS:  Minimal.   COMPLICATIONS:  None.   SPECIMEN:  L5 vertebral body biopsy.     ____________________________ Leitha SchullerMichael J. Tyrisha Benninger, MD mjm:ms D: 12/16/2012 21:31:54 ET T: 12/16/2012 22:29:23 ET JOB#: [id number]  cc: Leitha SchullerMichael J. Hades Mathew, MD, <Dictator> Leitha SchullerMICHAEL J Monita Swier MD ELECTRONICALLY SIGNED 12/17/2012 7:40

## 2014-11-18 NOTE — H&P (Signed)
PATIENT NAME:  Ashley Young, Ashley Young MR#:  [id number] DATE OF BIRTH:  Nov 29, 1940  DATE OF ADMISSION:  03/31/2013  PRIMARY CARE PHYSICIAN: [facility] Burnad[facility]avong [facility].   REFERRING PHYSICIAN: Dr. Lowella FairyJohn Woodruff.   CHIEF COMPLAINT: Fall.  HISTORY OF PRESENT ILLNESS: The patient is a 74 year old female who has a past medical history of GI bleed secondary to NSAID use, chronic migraines, chronic anemia and on iron IV replacement therapy, CVA, osteoporosis, malnutrition, sciatic pains, depression, anxiety and recurrent UTIs and nephrolithiasis, who [facility] Dr. Sherrlyn HockPandit for her routine regular appointment and while she was getting up to go in the office, she fell down on her right side and got up, went inside, saw Dr. Sherrlyn HockPandit and went home. She continued having pain on her right side of the body yesterday after this episode and so she could not sleep properly because of that pain, which was mainly on the right hip and was feeling some tingling in her right side of the whole lower limb, so [facility], and they did some x-rays and found having fracture of the right hip and sent her in to ER for admission. ER physician spoke to the orthopedic doctor and they suggested to admit to medical service for multiple medical issues and they will do the surgery. Dr. Juanell FairlyKevin Krasinski is aware about the patient. ER physician spoke to him. On further questioning, the patient denies any other complaint, except pain on the right side of the hip and in thigh and some numbness in right lower extremity.  REVIEW OF SYSTEMS:    CONSTITUTIONAL: Negative for fever, fatigue, weakness. Pain in the right side of the lower limb. No weight loss or weight gain.  EYES: No blurring or double vision, any discharge or redness in the eyes.  EARS, NOSE, THROAT: No tinnitus, ear pain or hearing loss.  RESPIRATORY: No cough, wheezing, hemoptysis, dyspnea or asthma.   CARDIOVASCULAR: No chest pain, orthopnea, edema, arrhythmia or palpitations.  GASTROINTESTINAL: No nausea, vomiting, diarrhea or abdominal pain.  GENITOURINARY: No dysuria, hematuria or increased frequency of the urination.  ENDOCRINE: No excessive sweating. No heat or cold intolerance.   SKIN: No acne or rashes on the skin.  MUSCULOSKELETAL: No pain or tenderness or swelling in the joints.  NEUROLOGICAL: There is some numbness in the right lower limb. No weakness or dysarthria or tremors and was able to walk without any support until she fell down.  PSYCHIATRIC: No anxiety, insomnia or bipolar disorder.   PAST MEDICAL HISTORY: Consistent with recurrent GI bleeding secondary to NSAID use, chronic migraines, severe osteoporosis, sciatica, depression, anxiety, history of recurrent UTI and nephrolithiasis, malnutrition.   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   ALLERGIES: ASPIRIN, NSAIDS AND PENICILLIN.   SOCIAL HISTORY: No smoking. No alcohol use. No illicit drug use. Lives at home with her husband.   FAMILY HISTORY: Mother died of breast cancer. Father died of cancer of some unknown type.  HOME MEDICATIONS: As per discharge last month from hospital:  1.  Fluoxetine 10 mg oral capsule once a day.  2.  Amitriptyline 50 mg once a day.  3.  Tramadol 50 mg every 4 hours as needed.  4.  Acetaminophen and oxycodone 325/5 mg oral tablet every 6 hours as needed for pain.  5.  Pantoprazole 40 mg delayed-release  2 times a day.  PHYSICAL EXAMINATION:  VITAL SIGNS: In ER, temperature 97, pulse 81, respirations 20, blood pressure 117/70 and pulse oximetry 99 on room air.  GENERAL: The patient is alert and oriented to time, place and person. Does not appear in any acute  distress. Is thin and appears severely malnourished with having severe muscle wasting.   ASSESSMENT AND PLAN: Came to Emergency Room after having a fall and went to orthopedic clinic, found having fracture, sent for admission and further work-up.  1.  Right hip fracture: Will admit to medical service for multiple medical problems and orthopedic doctor, Dr. Juanell Fairly, is aware about the patient's case as per ER physician. They will manage patient's pain, deep venous thrombosis prophylaxis and surgery issues.  2.  Malnutrition: Will give her Ensure while she is in the hospital and also recommend to continue that after discharge. Part of it might be contributed to by gastroesophageal stricture.  3.  Chronic anemia: Currently, hemoglobin is stable and does not need any treatment for that. Will continue monitoring.  4.  Severe osteoporosis: Will give vitamin D and calcium.  5.  Depression and anxiety: Will continue amitriptyline and fluoxetine as she was taking at home.   CODE STATUS: Full code.   TOTAL TIME SPENT: 50 minutes in this admission.    ____________________________ Hope Pigeon Elisabeth Pigeon, MD vgv:jm D: 03/31/2013 19:33:18 ET T: 03/31/2013 20:15:33 ET JOB#: [id number]  cc: Hope Pigeon. Elisabeth Pigeon, MD, <Dictator> Marisue Ivan, MD Altamese Dilling MD ELECTRONICALLY SIGNED 04/03/2013 15:42

## 2014-11-18 NOTE — H&P (Signed)
PATIENT NAME:  Ashley Young, Ashley Young MR#:  [id number] DATE OF BIRTH:  Jan 16, 1941  DATE OF ADMISSION:  02/19/2013  PRIMARY CARE PHYSICIAN: Dr. Burnadette Pop.   CHIEF COMPLAINT: Diarrhea and poor p.o. intake.   HISTORY OF PRESENT ILLNESS: This is a 74 year old female, who comes to the hospital today due to worsening p.o. intake and ongoing diarrhea for the past few days. The patient has a significant GI history with a history of recurrent gastrointestinal bleeding due to NSAID use and peptic ulcer disease. She also has a partial gastrectomy. She has been having some dysphagia, some poor p.o. intake for the past few weeks or so. She has gone and seen a gastroenterologist at [facility] and the plan was to do an upper GI endoscopy coming up the early part of August. Her symptoms have progressed and have been deteriorating she has been more weak, lost more weight and now started having diarrhea for the past 2 days. Husband was a bit concerned and brought her to the ER for further evaluation. As per the husband, the patient also has been having some intermittent hematuria and she does have a history of kidney stones, but she denies any abdominal pain. She denies any fevers, chills, any chest pains, any shortness of breath or any other associated symptoms. The patient presented to the ER and was noted to be hypokalemic and with mild acute renal failure and noted to have abnormal LFTs. Hospitalist services were contacted for further treatment and evaluation.   REVIEW OF SYSTEMS: CONSTITUTIONAL: No documented fever. Positive generalized weakness. Positive weight loss about 15 to 20 pounds over the past 6 months. EYES: No blurry or double vision. ENT: No tinnitus. No postnasal drip. No redness of the oropharynx. RESPIRATORY: No cough. No wheeze. No hemoptysis. No dyspnea. CARDIOVASCULAR: No chest pain, no orthopnea, no palpitations, no syncope. GASTROINTESTINAL: Positive nausea. Positive vomiting. Positive diarrhea.  No abdominal pain. No melena. No hematochezia.  GENITOURINARY: Positive hematuria. No dysuria. No frequency. ENDOCRINE: No polyuria. No nocturia. No heat or cold intolerance. HEMATOLOGIC: No anemia, no bruising, no bleeding. INTEGUMENTARY: No rashes or lesions. MUSCULOSKELETAL: No arthritis. No swelling. No gout. NEUROLOGIC: No numbness. No tingling. No ataxia. No seizure-type activity. PSYCHIATRIC: Positive depression. No anxiety. No ADD.   PAST MEDICAL HISTORY: Consistent with recurrent GI bleeding secondary to NSAID use, chronic migraines, severe osteoporosis, sciatic, depression/anxiety, history of recurrent UTIs and nephrolithiasis.   PAST SURGICAL HISTORY: Consistent with vagotomy and partial gastrectomy, surgery for gastric outlet obstruction, breast biopsy, tonsillectomy adenoidectomy, cholecystectomy, appendectomy and kyphoplasty.   ALLERGIES: ASPIRIN, NSAIDS AND PENICILLIN.   SOCIAL HISTORY: No smoking. No alcohol abuse. No illicit drug abuse. Lives at home with her husband.   FAMILY HISTORY: Mother died from breast cancer. Father died from cancer of unknown type.   CURRENT MEDICATIONS: As follows: Amitriptyline 50 mg at bedtime, fluoxetine 10 mg daily, Osphena 60 mg daily and tramadol 50 mg q.4 hours as needed.   PHYSICAL EXAMINATION: Presently is as follows: VITAL SIGNS: Temperature is 97.8, pulse 92, respirations 18, blood pressure 124/57 and sats 100% on 2 L nasal cannula.  GENERAL: She is a cachectic-appearing female, but in no apparent distress.  HEENT: Atraumatic, normocephalic. Her extraocular muscles are intact. The pupils are equal and reactive to light. Sclerae anicteric. No conjunctival injection. No pharyngeal erythema.  NECK: Supple. There is no jugular venous distention. No bruits, no lymphadenopathy, no thyromegaly.  HEART: Regular rate and rhythm. No murmurs, no rubs, no clicks.  LUNGS: Clear to  auscultation bilaterally. No rales, no rhonchi, no wheezes.  ABDOMEN:  Soft, flat, nontender, nondistended. Hypoactive bowel sounds. No hepatosplenomegaly appreciated.  EXTREMITIES: No evidence of any cyanosis, clubbing, or peripheral edema. Has +2 pedal and radial pulses bilaterally.  NEUROLOGIC: The patient is alert, awake, and oriented x 3 with no focal, motor or sensory deficits appreciated bilaterally.  SKIN: Moist and warm with no rash appreciated.  LYMPHATIC: There is no cervical or axillary lymphadenopathy.   LABORATORY DATA: Serum glucose of 95, BUN 24, creatinine 0.8, sodium 135, potassium 2.9, chloride 99, bicarbonate 25. The patient's albumin is 2.4, alkaline phosphatase 309, AST 201, ALT 122. Troponin 0.13. White cell count 6.7, hemoglobin 12.9, hematocrit 39.8, platelet count 151. INR is 1.2.   The patient did have a chest x-ray done, which showed hyperinflation consistent with COPD.   ASSESSMENT AND PLAN: This is a 74 year old female with history of recurrent gastrointestinal bleeding due to NSAID use and peptic ulcer disease, status post partial gastrectomy, history of nephrolithiasis and recurrent urinary tract infections, depression/anxiety, who presents to the hospital due to poor p.o. intake, diarrhea and weight loss, which has progressively gotten worse over the past few weeks.  1.  Dehydration. This is likely secondary to the poor p.o. intake and now having ongoing diarrhea for the past 2 days. The patient presented to the hospital with blood pressures in the 70s. It has responded well to IV fluids. For now, we will hydrate her with IV fluids and follow her clinically and follow her hemodynamics.  2.  Hypokalemia. This is also likely secondary to the diarrhea and poor p.o. intake. I will replace her potassium accordingly and repeat it. I will check a magnesium level.  3.  Acute diarrhea. Again, the etiology of the diarrhea is also unclear. The patient has not been on any recent antibiotics. It may just be viral and self-limiting in nature and has  improved since yesterday. If she continues to have diarrhea, I will check stool for Clostridium difficile and comprehensive culture. Continue supportive care with IV fluids and a clear liquid diet for now.  4.  Abnormal liver function tests. Again, the etiology of this is also unclear. She had a CAT scan of the abdomen and pelvis done early part of this month, which showed no evidence of any biliary or liver pathology. I will get a limited abdominal ultrasound and also get hepatitis panel and follow her liver function tests.  5.  Failure to thrive and dysphagia. The patient has been worked up as an outpatient by gastroenterology and was supposed to have an upper gastrointestinal endoscopy coming up in beginning part of August. I will get go ahead and get a gastroenterology consult. I discussed the case with Dr. Bluford Kaufmannh who probably plans on doing an endoscopy during this hospitalization. Continue clear liquid diet for now.  6.  History of depression. Continue Prozac and Elavil. 7.  Elevated troponin: This is likely in the setting of dehydration and diarrhea, unlikely demand ischemia. No evidence of any acute chest pain or any EKG changes. I will observe her off unit telemetry, follow serial cardiac markers and will get a cardiology consult.   CODE STATUS: The patient is a full code.   TIME SPENT ON ADMISSION: 50 minutes. The patient will be transferred over to Dr. Sherryll BurgerLinthavong's service.   ____________________________ Rolly PancakeVivek J. Cherlynn KaiserSainani, MD vjs:aw D: 02/19/2013 16:15:05 ET T: 02/19/2013 16:28:55 ET JOB#: [id number]  cc: Rolly PancakeVivek J. Cherlynn KaiserSainani, MD, <Dictator> Houston SirenVIVEK J SAINANI MD ELECTRONICALLY SIGNED  02/27/2013 15:23 

## 2014-11-27 NOTE — Consult Note (Signed)
PATIENT NAME:  Ashley Young, Ashley Young MR#:  [id number] DATE OF BIRTH:  [birth year]  DATE OF CONSULTATION:  11/09/2014  REFERRING PHYSICIAN:   CONSULTING PHYSICIAN:  Katha Hamming, MD  PRIMARY CARE PHYSICIAN: Marisue Ivan, MD  EMERGENCY ROOM PHYSICIAN: Bebe Shaggy. Darnelle Catalan, MD   CHIEF COMPLAINT: Palpitations.  HISTORY OF PRESENT ILLNESS: A 73 year old female patient sent in from Dr. Sherryll Burger office because of tachycardia. The patient was here 2 weeks ago. She was admitted on April 1 for left flank pain and discharged on April 3 after left ureteral stent placed for nonobstructing nephrolithiasis with hydronephrosis. The patient's stents were working well. Today, she went to Dr. Delana Meyer office for a follow-up, where she was found to have elevated heart rate with heart rate up to 120 beats per minute. Because of that, she was sent to Dr. Sherryll Burger office; and there, also, she was monitored for about 1-2 hours, and she was persistently tachycardic with heart rate of 120-130 beats per minute. Concerning this, she was sent to the Emergency Room. In the ER, the patient was in sinus tachycardia with heart rate 129 beats per minute and with normal blood pressure. She also had severe hypokalemia with potassium of 2.9. The patient says that since last discharge, she is not able to eat well, as there is continuous nausea, unable to eat much. Complains of some discomfort around the flank, where she had stents. No diarrhea and poor p.o. intake because of nausea. The patient has a history of biliary stricture and had a history for gastroduodenal stent placed by Dr. Josetta Huddle at [facility], and the patient's abdominal CAT scan, last admission that  was 2 weeks ago, showed common bile duct dilatation at  14 mm, which has progressed from 2014. So, we are concerned that her symptoms of nausea and poor p.o. intake are probably related to her worsening of biliary stricture and she needs further revision of stricture. So, we  asked the ER doctor to transfer her to [facility] to evaluate for her gastroduodenal stent, please. The patient received IV fluids and also potassium 40 mEq p.o. once.   PAST MEDICAL HISTORY: Significant for a history of hyperlipidemia, degenerative joint disease, kidney stones, migraine headaches, anemia, history of GI bleed with peptic ulcer disease, status post surgery.   PAST SURGICAL HISTORY: Significant for partial gastrectomy with vagotomy, hip bursa repair, breast biopsy, tonsillectomy, C-section.  ALLERGIES: NSAID, ASPIRIN, IODINE, PENICILLIN.   FAMILY HISTORY: Mother had heart problems and cancer.   SOCIAL HISTORY: Married, lives with her husband, retired. Ex-smoker, quit 27 years ago. No alcohol. No drugs.   HOME MEDICATIONS: Include Amitriptyline 50 mg p.o. daily, Excedrin Migraine 250/65 mg 2 tablets every 6 hours, fluoxetine 10 mg p.o. daily, pantoprazole 40 mg p.o. b.i.d., promethazine 25 mg every 6 hours as needed for nausea, tamsulosin 0.4 mg p.o. daily, vitamin D 1000 units once a day.   REVIEW OF SYSTEMS:  CONSTITUTIONAL: Has no fever. No fatigue. Feels weak and also poor appetite.  EYES: No blurred vision.  EARS, NOSE, AND THROAT: No tinnitus. No epistaxis. No difficulty swallowing.  RESPIRATORY: No shortness of breath. No cough.  CARDIOVASCULAR: No chest pain, no orthopnea, no PND.  GASTROINTESTINAL: Complains of nausea, vomiting, poor appetite. Also complains of mild right epigastric abdominal discomfort.  GENITOURINARY: No dysuria, no hematuria. Has a history of renal calculi present. No frequency, no incontinence.  ENDOCRINE: No polyuria or nocturia.  HEMATOLOGIC: No anemia or easy bruising.  INTEGUMENTARY: No skin rashes.  MUSCULOSKELETAL: No joint  pain.  NEUROLOGIC: No numbness or weakness. No dysarthria PSYCHIATRIC: No anxiety or insomnia.   PHYSICAL EXAMINATION: VITAL SIGNS: Temperature 98 Fahrenheit, heart rate 129, blood pressure 124/80, saturations 98% on room  air.  GENERAL: She is a 74 year old female, slightly cachectic, malnourished with a BMI of only 15.3, appears to be not in distress.  HEAD: Atraumatic, normocephalic.  EYES: Pupils equal, reacting to light.  EARS, NOSE, AND THROAT: No tympanic membrane congestion. No turbinate hypertrophy. No oropharyngeal erythema.  NECK: Supple. No JVD. No carotid bruit. Normal range of motion.  RESPIRATORY: Good respiratory effort. Clear to auscultation. No wheeze. No rales.  CARDIOVASCULAR: S1, S2 regular, tachycardic. The patient's PMI not displaced. Pulses equal at carotid, femoral, and dorsalis pedis. No peripheral edema.  ABDOMEN: Soft. Slight epigastric tenderness present. No rebound tenderness. No hernias. No CVA tenderness.  MUSCULOSKELETAL: Able to move all extremities x 4. The patient has no joint tenderness. No effusion.  SKIN: Inspection is normal. Decreased skin turgor is observed.  NEUROLOGIC: Cranial nerves are intact. Power 5/5 in upper and lower extremities. Sensations are intact. Deep tendon reflexes 2+ bilaterally. The patient has no focal neurological deficits.  PSYCHIATRIC: Motor and affect are within normal limits.   LABORATORY DATA: WBC 12.7, hemoglobin 13, hematocrit 41.7, and platelets 560,000. Electrolytes: Sodium is 136, potassium 2.9, chloride 96, bicarbonate 24, BUN 11, creatinine 0.86, glucose 105. The patient's alkaline phosphatase is 155. LFTs are normal. Alkaline phosphatase 2 weeks ago was at 126. The patient's troponin is slightly up at 0.08, TSH 7.096.   ASSESSMENT AND PLAN:  1. This is a 74 year old female patient with nausea, poor p.o. intake, tachycardia, hyperkalemia likely secondary to dehydration. The patient already received IV fluids and potassium. Recommend potassium at 30 mEq one time. Her symptoms are likely related to her progressive biliary elevation with possibility of stricture. She needs revision because of her nausea and the poor p.o. intake, and the patient's  family [facility]. The same is discussed with Dr. Dorothea GlassmanPaul Malinda.  2. Left ureteral stone, status post stent placement. Her renal function is stable. She can follow up with Dr. Apolinar JunesBrandon as an outpatient once this issue with nausea resolves.  3. Sinus tachycardia, hyperkalemia secondary to dehydration. Fluids and potassium replacement.  4. 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 The patient needs further monitoring of platelets closely. At this time, no intervention for that.   TIME SPENT: About 55 minutes.     ____________________________ Katha HammingSnehalatha Brynleigh Sequeira, MD sk:mw D: 11/09/2014 17:12:23 ET T: 11/09/2014 18:34:56 ET JOB#: [id number]  cc: Katha HammingSnehalatha Haruki Arnold, MD, <Dictator>

## 2014-11-27 NOTE — H&P (Addendum)
PATIENT NAME:  Ashley Young, Ashley Young MR#:  [id number] DATE OF BIRTH:  [birth year]  DATE OF ADMISSION:  11/09/2014  PRIMARY CARE PHYSICIAN: Marisue IvanKanhka Linthavong, MD  EMERGENCY ROOM PHYSICIAN: Bebe Shaggyaul F. Darnelle CatalanMalinda, MD   CHIEF COMPLAINT: Palpitations.  HISTORY OF PRESENT ILLNESS: A 74 year old female patient sent in from Dr. Sherryll BurgerLinthavong's office because of tachycardia. The patient was here 2 weeks ago. She was admitted on April 1 for left flank pain and discharged on April 3 after left ureteral stent placed for nonobstructing nephrolithiasis with hydronephrosis. The patient's stents were working well. Today, she went to Dr. Delana MeyerBrandon's office for a follow-up, where she was found to have elevated heart rate with heart rate up to 120 beats per minute. Because of that, she was sent to Dr. Sherryll BurgerLinthavong's office; and there, also, she was monitored for about 1-2 hours, and she was persistently tachycardic with heart rate of 120-130 beats per minute. Concerning this, she was sent to the Emergency Room. In the ER, the patient was in sinus tachycardia with heart rate 129 beats per minute and with normal blood pressure. She also had severe hypokalemia with potassium of 2.9. The patient says that since last discharge, she is not able to eat well, as there is continuous nausea, unable to eat much. Complains of some discomfort around the flank, where she had stents. No diarrhea and poor p.o. intake because of nausea. The patient has a history of biliary stricture and had a history for gastroduodenal stent [facility], and the patient's abdominal CAT scan, last admission that  was 2 weeks ago, showed common bile duct dilatation at  14 mm, which has progressed from 2014. So, we are concerned that her symptoms of nausea and poor p.o. intake are probably related to her worsening of biliary stricture and she needs further revision of stricture. So, we asked the ER doctor to [facility] her gastroduodenal  stent, please. The patient received IV fluids and also potassium 40 mEq p.o. once.   PAST MEDICAL HISTORY: Significant for a history of hyperlipidemia, degenerative joint disease, kidney stones, migraine headaches, anemia, history of GI bleed with peptic ulcer disease, status post surgery.   PAST SURGICAL HISTORY: Significant for partial gastrectomy with vagotomy, hip bursa repair, breast biopsy, tonsillectomy, C-section.  ALLERGIES: NSAID, ASPIRIN, IODINE, PENICILLIN.   FAMILY HISTORY: Mother had heart problems and cancer.   SOCIAL HISTORY: Married, lives with her husband, retired. Ex-smoker, quit 27 years ago. No alcohol. No drugs.   HOME MEDICATIONS: Include Amitriptyline 50 mg p.o. daily, Excedrin Migraine 250/65 mg 2 tablets every 6 hours, fluoxetine 10 mg p.o. daily, pantoprazole 40 mg p.o. Young.i.d., promethazine 25 mg every 6 hours as needed for nausea, tamsulosin 0.4 mg p.o. daily, vitamin D 1000 units once a day.   REVIEW OF SYSTEMS:  CONSTITUTIONAL: Has no fever. No fatigue. Feels weak and also poor appetite.  EYES: No blurred vision.  EARS, NOSE, AND THROAT: No tinnitus. No epistaxis. No difficulty swallowing.  RESPIRATORY: No shortness of breath. No cough.  CARDIOVASCULAR: No chest pain, no orthopnea, no PND.  GASTROINTESTINAL: Complains of nausea, vomiting, poor appetite. Also complains of mild right epigastric abdominal discomfort.  GENITOURINARY: No dysuria, no hematuria. Has a history of renal calculi present. No frequency, no incontinence.  ENDOCRINE: No polyuria or nocturia.  HEMATOLOGIC: No anemia or easy bruising.  INTEGUMENTARY: No skin rashes.  MUSCULOSKELETAL: No joint pain.  NEUROLOGIC: No numbness or weakness. No dysarthria PSYCHIATRIC: No  anxiety or insomnia.   PHYSICAL EXAMINATION: VITAL SIGNS: Temperature 98 Fahrenheit, heart rate 129, blood pressure 124/80, saturations 98% on room air.  GENERAL: She is a 74 year old female, slightly cachectic, malnourished  with a BMI of only 15.3, appears to be not in distress.  HEAD: Atraumatic, normocephalic.  EYES: Pupils equal, reacting to light.  EARS, NOSE, AND THROAT: No tympanic membrane congestion. No turbinate hypertrophy. No oropharyngeal erythema.  NECK: Supple. No JVD. No carotid bruit. Normal range of motion.  RESPIRATORY: Good respiratory effort. Clear to auscultation. No wheeze. No rales.  CARDIOVASCULAR: S1, S2 regular, tachycardic. The patient's PMI not displaced. Pulses equal at carotid, femoral, and dorsalis pedis. No peripheral edema.  ABDOMEN: Soft. Slight epigastric tenderness present. No rebound tenderness. No hernias. No CVA tenderness.  MUSCULOSKELETAL: Able to move all extremities x 4. The patient has no joint tenderness. No effusion.  SKIN: Inspection is normal. Decreased skin turgor is observed.  NEUROLOGIC: Cranial nerves are intact. Power 5/5 in upper and lower extremities. Sensations are intact. Deep tendon reflexes 2+ bilaterally. The patient has no focal neurological deficits.  PSYCHIATRIC: Motor and affect are within normal limits.   LABORATORY DATA: WBC 12.7, hemoglobin 13, hematocrit 41.7, and platelets 560,000. Electrolytes: Sodium is 136, potassium 2.9, chloride 96, bicarbonate 24, BUN 11, creatinine 0.86, glucose 105. The patient's alkaline phosphatase is 155. LFTs are normal. Alkaline phosphatase 2 weeks ago was at 126. The patient's troponin is slightly up at 0.08, TSH 7.096.   ASSESSMENT AND PLAN:  1. This is a 74 year old female patient with nausea, poor p.o. intake, tachycardia, hyperkalemia likely secondary to dehydration. The patient already received IV fluids and potassium. Recommend potassium at 30 mEq one time. Her symptoms are likely related to her progressive biliary elevation with possibility of stricture. She needs revision because of her nausea and the poor p.o. intake, and the patient's family wants her to be transferred to [facility]. The same is discussed with Dr. Dorothea Glassman.  2. Left ureteral stone, status post stent placement. Her renal function is stable. She can follow up with Dr. Apolinar Junes as an outpatient once this issue with nausea resolves.  3. Sinus tachycardia, hyperkalemia secondary to dehydration. Fluids and potassium replacement.  4. 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 The patient needs further monitoring of platelets closely. At this time, no intervention for that.   TIME SPENT: About 55 minutes.    ____________________________ Katha Hamming, MD sk:mw D: 11/09/2014 17:12:00 ET T: 11/09/2014 18:34:56 ET JOB#: [id number] Katha Hamming MD ELECTRONICALLY SIGNED 11/29/2014 21:17

## 2014-11-27 NOTE — Op Note (Signed)
PATIENT NAME:  Ashley Young, Ashley B MR#:  [id number] DATE OF BIRTH:  02-04-1941  DATE OF PROCEDURE:  10/28/2014  PREOPERATIVE DIAGNOSIS: Left ureteral stone, hydronephrosis.   POSTOPERATIVE DIAGNOSIS: Left ureteral stone, hydronephrosis.   PROCEDURE PERFORMED: Cystoscopy, left ureteral stent placement.   ATTENDING SURGEON: Claris GladdenAshley J. Everett Ehrler, MD   ANESTHESIA: General anesthesia.   ESTIMATED BLOOD LOSS: Minimal.   DRAINS: A 6 x 24 French double-J ureteral stent on the left.   COMPLICATIONS: None.   SPECIMENS: None.   INDICATION: This is a 74 year old female who presented to the Emergency Room with acute onset of worsening left flank pain, which was poorly controlled. There is no evidence of infection. She was counseled to undergo left ureteral stent placement for the purpose of pain control. Risks and benefits of the procedure were explained in detail to the patient, who agreed to proceed as planned.   PROCEDURE IN DETAIL: The patient was correctly identified in the preoperative holding area and informed consent was confirmed. She was brought to the operating suite and placed on the table in supine position. At this time, a universal timeout protocol was performed. All team members were identified, Venodyne boots were placed, and she was administered 500 mg of IV Levaquin in the perioperative period. She was then placed under general anesthesia, repositioned lower on the bed in the dorsal lithotomy position and prepped and draped in standard surgical fashion. A rigid cystoscope was then advanced per urethra into the bladder. Of note, the bladder had a significant descent from a cystocele. There were some small bladder stones present, which were easily evacuated from the bladder using a 22 French sheath. Attention was then turned to the left ureteral orifice, which was cannulated using a 5 [place]French open-ended ureteral catheter. A Sensor wire was placed up to the level of the kidney under  fluoroscopic guidance. A 6 x 22 French double-J ureteral stent was then advanced over the wire up to the level of the renal pelvis. The wire was partially withdrawn until the coil was noted within the renal pelvis. The wire was then fully withdrawn and a coil was noted within the bladder. Of note, a 9 mm stone was radiographically visible in the proximal ureter, and the wire was able to pass by the stone without difficulty. The bladder was then drained. The patient was repositioned in the supine position, reversed from anesthesia and taken to the PACU in stable condition. There were no complications in this case.      ____________________________ Claris GladdenAshley J. Shemiah Rosch, MD ajb:mw D: 10/28/2014 17:41:15 ET T: 10/28/2014 18:48:21 ET JOB#: [id number]  cc: Claris GladdenAshley J. Eevee Borbon, MD, <Dictator> Claris GladdenASHLEY J Natara Monfort MD ELECTRONICALLY SIGNED 11/08/2014 18:10

## 2014-11-27 NOTE — H&P (Addendum)
PATIENT NAME:  [name], Ashley B MR#:  [id number] DATE OF BIRTH:  Apr 11, 1941  DATE OF ADMISSION:  11/09/2014  ADDENDUM:    Patient EKG shows sinus tachycardia with heart rate 126 beats per minute, no ST-T changes.    ____________________________ Katha HammingSnehalatha Barett Whidbee, MD sk:nt D: 11/09/2014 18:11:19 ET T: 11/09/2014 18:41:50 ET JOB#: [id number]  cc: Katha HammingSnehalatha Peggi Yono, MD, <Dictator> Katha HammingSNEHALATHA Jyair Kiraly MD ELECTRONICALLY SIGNED 11/29/2014 21:11

## 2014-11-27 NOTE — Consult Note (Signed)
PATIENT NAME:  Ashley Young, Ashley Young MR#:  [id number] DATE OF BIRTH:  09/04/1940  DATE OF CONSULTATION:  10/29/2014  CONSULTING PHYSICIAN:  Christena DeemMartin U. Saloni Lablanc, MD  ADDENDUM:   ASSESSMENT: Again, the type of material used for the gastroduodenal stent is uncertain and, therefore, likely would represent a contraindication towards MRCP. In regard to further biliary evaluation, I would recommend having her follow-up with [facility], as she needs to follow up in regards to the placement of this stent, particularly in light of the fact that she feels that she is becoming nauseated more frequently. This may need revision.  In regard to her history of gastric problems. This likely stems from the use of aspirin products many years ago with recurrent ulceration necessitating surgery and revision thereafter. She continues, however, to take Excedrin, this containing aspirin, for chronic problems with migraine headaches. She states this is effective for her and it is difficult for her to function without taking them. I have suggested there may be alternatives or that we might be able to set her up with chronic pain management in this regard. In the interim, she needs to continue on the proton pump inhibitor as a daily medication. It is quite likely there is minimal acid being produced in the stomach currently due to the history of vagotomy, however, oftentimes the vagus nerve is incompletely clipped and may have branches not apparent on surgery.   The patient discussed with Dr. Fonnie BirkenheadWhiting. It is of note that the alkaline phosphatase is improved status post placement of the ureteral stent, indicating possible elevation of the alkaline phosphatase due to kidney fraction.     ____________________________ Christena DeemMartin U. Griffin Dewilde, MD mus:TT D: 10/29/2014 17:40:07 ET T: 10/29/2014 18:09:26 ET JOB#: [id number]  cc: Christena DeemMartin U. Linkoln Alkire, MD, <Dictator> Christena DeemMARTIN U Karas Pickerill MD ELECTRONICALLY SIGNED 11/08/2014 14:26

## 2014-11-27 NOTE — H&P (Addendum)
PATIENT NAME:  Ashley Young, Ashley B MR#:  [id number] DATE OF BIRTH:  06-Apr-1941  DATE OF ADMISSION:  11/09/2014  Patient consult as dictated by me earlier but now patient is on [facility].   The patient will be admitted here and will be transferred when the bed is available.   She will have IV fluids with potassium for her hypokalemia. She will be admitted to telemetry for bradycardia and hypokalemia and dehydration.   ADMITTING DIAGNOSIS:  1.  Hypokalemia, tachycardia secondary to dehydration; replace the potassium in IV fluids and recheck potassium tomorrow morning.  2.  Nausea, abdominal pain, likely secondary to her biliary stricture. She needs further intervention. Emergency Room physician Dr. Darnelle CatalanMalinda spoke to Dr. Council MechanicGangarosa, Biliary [facility], so she will go there when the bed is available.  3.  The same is discussed with the family and they are agreeable for this plan.   Please change my consult to history and physical.   Total time is still 55 minutes.    ____________________________ Katha HammingSnehalatha Barney Russomanno, MD sk:nt D: 11/09/2014 18:02:21 ET T: 11/09/2014 18:25:28 ET JOB#: [id number]  cc: Katha HammingSnehalatha Kelton Bultman, MD, <Dictator> Katha HammingSNEHALATHA Ashby Moskal MD ELECTRONICALLY SIGNED 11/29/2014 21:11

## 2014-11-27 NOTE — Discharge Summary (Signed)
PATIENT NAME:  Ashley Young, Naylin B MR#:  [id number] DATE OF BIRTH:  1940/12/30  DATE OF ADMISSION:  10/28/2014 DATE OF DISCHARGE:  10/30/2014  PRIMARY CARE PHYSICIAN: Marisue IvanKanhka Linthavong, MD.  Margarette AsalUROLOGISClaris Gladden: Ashley J. Brandon, MD.   FINAL DIAGNOSES:  1. Obstructing nephrolithiasis with hydronephrosis, status post stenting procedure by Dr. Vanna ScotlandAshley Brandon.  2. Hypokalemia.  3. Chronic nausea.  4. Dilation of biliary tract.  5. Malnutrition.   MEDICATIONS ON DISCHARGE: Include fluoxetine 10 mg daily, amitriptyline 50 mg at bedtime, Protonix 40 mg twice a day, vitamin D 1000 international units daily, oyster shell calcium with vitamin D 500 mg/200 international units 1 tablet twice a day, acetaminophen oxycodone 325/5 mg 1 tablet every 6 hours as needed for moderate pain, Flomax 0.4 mg 1 capsule at bedtime, promethazine 25 mg every 6 hours as needed for nausea, oxybutynin 5 mg 3 times a day as needed for bladder spasms.   TREATMENT: Strain urine. Save stone.   DIET: Regular consistency, regular diet. The patient may have to eat 6 or 7 small meals a day because of her history of stomach surgeries.  ACTIVITY: As tolerated.  HOSPITAL COURSE: The patient was admitted April 1, discharged 10/30/2014. The patient came in with flank pain for 2 days, found to have obstructing 9 x 7 mm stone in the proximal left ureter, extensive bilateral nephrolithiasis.  LABORATORY AND RADIOLOGICAL DATA DURING THE HOSPITAL COURSE: Included a white blood cell count of 13, hemoglobin 11.4, hematocrit 37.7, platelet count of 390,000. Urinalysis: Trace ketones. Liver function tests: Total bilirubin 0.2, direct less than 0.1, alkaline phosphatase 134, ALT 13, AST 21, total protein 6.3, albumin 3.3. Glucose 111, BUN 11, creatinine 0.74, sodium 134, potassium 3, chloride 102, CO2 of 24. CT scan of the abdomen and pelvis with contrast showed obstructing 9 x 7 mm stone in the proximal left ureter, extensive bilateral  nephrolithiasis, progressive biliary dilation since 2014. Patent gastroenteric anastomosis after stenting. White count upon discharge 7.8, hemoglobin 9.7, platelet count of 372,000, creatinine 0.49. Liver function tests again normal range. Albumin low at 2.7.  HOSPITAL COURSE PER PROBLEM LIST:  1. Obstructing nephrolithiasis with hydronephrosis, status post stents by Dr. Vanna ScotlandAshley Brandon. This was done on April 2. The patient will need followup appointment with urology for lithotripsy. The patient was given Ditropan for bladder spasms, Flomax for better urine flow, as-needed pain and nausea medications.  2. Hypokalemia. This was replaced during the hospital course.  3. Chronic nausea. The patient did have vomiting on April 2 so the patient was kept until April 3. The patient felt a little bit better even though she has not been eating very much.  4. Dilation of the biliary tract. Seen in consultation by Dr. Marva PandaSkulskie. Recommend GI followup as outpatient with [facility].  5. Malnutrition. Can consider Ensure as outpatient. The patient must eat 6 or 7 small meals a day, whatever her stomach can handle, but must stay hydrated to avoid kidney stones being lodged and stuck in the ureters.  TIME SPENT ON DISCHARGE: 35 minutes.    ____________________________ Herschell Dimesichard J. Renae GlossWieting, MD rjw:jh D: 10/30/2014 16:04:17 ET T: 10/31/2014 08:53:21 ET JOB#: [id number]  cc: Herschell Dimesichard J. Renae GlossWieting, MD, <Dictator> Marisue IvanKanhka Linthavong, MD Claris GladdenAshley J. Brandon, MD  Salley ScarletICHARD J Elic Vencill MD ELECTRONICALLY SIGNED 11/03/2014 15:41

## 2014-11-27 NOTE — Consult Note (Signed)
PATIENT NAME:  Ashley Young, Ashley Young MR#:  [id number] DATE OF BIRTH:  [birth year]  DATE OF CONSULTATION:  10/28/2014  REFERRING PHYSICIAN: Dr. Derrill KayGoodman from the emergency room.  PRIMARY CARE PHYSICIAN: Marisue IvanKanhka Linthavong, MD  CONSULTING PHYSICIAN: Vanna ScotlandAshley Vaishali Baise, MD   CHIEF COMPLAINT: Left proximal 9 mm ureteral stone.   HISTORY OF PRESENT ILLNESS: This is a 74 year old female with a history of recurrent nephrolithiasis, who presented to the Emergency Room with acute onset and worsening left flank pain. She also had associated nausea and vomiting. Her pain was unable to be controlled at home, therefore, she presented to the Emergency Room overnight.   She underwent a CT abdomen and pelvis, which revealed a 9 mm proximal left obstructing ureteral stone with moderate hydroureteronephrosis. Her urinalysis was negative for any signs of infection. Creatinine 0.74. WBC is 13.0. Her pain was unable to be controlled with several rounds of IV morphine She denies any fevers or chills.   The patient does report a history of nephrolithiasis and was followed many years ago by Dr. Orson SlickHarmon. She has had 2 previous used a ESWL procedures in the past. She has not had a stone episode in many years. She does think that this particular episode started earlier in March and has been intermittent colicky since that time with acute worsening overnight. She denies any urinary symptoms, including dysuria or hematuria. No history of significant urinary tract infections.   PAST MEDICAL HISTORY: 1. Hyperlipidemia.  2. History of gastrointestinal bleeding secondary to peptic ulcer disease.  3. Degenerative joint disease.  4. Nephrolithiasis.  5. Migraine headache.  6. Anemia.   PAST SURGICAL HISTORY:  1. Tonsillectomy.  2. Breast biopsy.  3. C-section.  4. Partial gastrectomy with vagotomy.  5. Hip bursa.  6. ESWL x2.   ALLERGIES:  1. NSAIDS.  2. ASPIRIN.  3. IRON.  4. PENICILLIN.   FAMILY HISTORY:  Noncontributory.   SOCIAL HISTORY: She is married with her husband the bed side. She is a former smoker, quitting approximately 20 years ago. No alcohol or substance abuse.   HOME MEDICATIONS:  1. Acetaminophen/hydrocodone 325/5 mg 1 to 2 tabs every 6 hours for pain.  2. Amitriptyline 50 mg 1 tab p.o. at bedtime.  3. Fluoxetine 10 mg daily.  4. Pantoprazole 40 mg p.o. Young.i.d.  5. Senna 50/8.6 mg p.o. Young.i.d.  6. Valium 2 mg 1 to 2 tabs every 6 hours p.r.n. muscle spasms. 7. Vitamin D 2000 international units daily.   REVIEW OF SYSTEMS: An extensive 12 point review of systems was performed and is otherwise negative other than as per history of present illness.   PHYSICAL EXAMINATION:  VITAL SIGNS: Temperature is 97.6, pulse is 97, blood pressure is 120/76, respirations 17, 96% on room air.  GENERAL: No acute distress, alert and oriented x 3.  HEENT: Extraocular movements intact. Moist mucous membranes.  NECK: Supple. No masses.  Trachea is midline.  CHEST: Clear to auscultation bilaterally. No increased work of breathing or use of accessory muscles.  CARDIOVASCULAR: Regular rate and rhythm.  ABDOMEN: Soft, nontender, nondistended.  Mild left CVA tenderness and voluntary guarding in the left lower quadrant. No right CVA tenderness. NEUROLOGIC:  Cranial nerves grossly intact.  No focal deficits. PSYCHIATRIC: Appropriate affect interacting pleasantly. SKIN:  No rashes lesions or bruises.  EXTREMITIES: No lower extremity edema bilaterally.  LYMPHATICS: No cervical or inguinal lymphadenopathy.   LABORATORY DATA: As per history of present illness. CT abdomen and pelvis with contrast performed on 10/27/2014 was personally  reviewed by myself. This shows a 9 mm stone just beyond the UPJ with extensive left perinephric edema and left hydronephrosis. There is also multiple bilateral stones, which are nonobstructing   ASSESSMENT AND PLAN: This is a 74 year old female, who presented to the Emergency  Room with a 9 mm left proximal ureteral stone and hydronephrosis. Given her relatively poor pain control and the very low likelihood of her passing the stone spontaneously, and I have recommended placement of a left ureteral stent today. We discussed this procedure in detail, as well as the purpose of the procedure in order to help facilitate kidney drainage. We discussed risks of the procedure including risk of inability to place a stent, continued pain, need for definitive stone procedure, stent pain, bladder spasms and infection. She understands these risks and is willing to proceed as planned.   1. Plan for add on case for left ureteral stent placement later today.  2. Please remain n.p.o. until her procedure, may advance diet afterwards.  3. The patient may be discharged at admitting/attending's discretion following the procedure. Please discharge home on Flomax 0.5 mg daily, as well as Ditropan 5 mg p.o. t.i.d. p.r.n. bladder spasms to help with stent pain.  4. The patient should followup in urology clinic in 1 week to discuss definitive management of the stone. This was briefly discussed today, including various options of ESWL versus ureteroscopy.  5. Antibiotics, on-call to operating room and consent signed.   Thank you for allowing me to participate in the care of this patient. I look forward to seeing her in followup.  ____________________________ Claris Gladden, MD ajb:AT D: 10/28/2014 17:38:15 ET T: 10/28/2014 19:12:14 ET JOB#: [id number]  cc: Claris Gladden, MD, <Dictator> Claris Gladden MD ELECTRONICALLY SIGNED 11/08/2014 18:11

## 2014-11-27 NOTE — Consult Note (Signed)
PATIENT NAME:  Ashley Young, Ashley Young MR#:  [id number] DATE OF BIRTH:  1940/09/21  DATE OF CONSULTATION:  10/29/2014  CONSULTING PHYSICIAN:  Ashley Deem, MD  REASON FOR CONSULTATION: Dilated common bile duct.   HISTORY OF PRESENT ILLNESS: Ms. Ashley Young is a 74 year old Caucasian female who was admitted to the hospital after coming in with left flank pain, nausea and vomiting. She was found to have a kidney stone which was obstructing the left ureter. She did undergo urology procedure and placement of a left ureteral stent for this 9 mm left proximal ureter stone. She feels very much better. She continues to have some mild nausea intermittently. Part of her evaluation included a CT scan of the abdomen and pelvis. This showed a common bile duct at 14 mm. This was felt to have progressed in size from the last evaluation in 2014. 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 She apparently within the past year or so has undergone several evaluations and endoscopic placement of a gastroenteric stent at [facility] by Dr. Josetta Young. She has had 4 different stomach surgeries, not including the placement of this stent. That was apparently done in about January of 2015. The last endoscopic procedure at [facility] was a colonoscopy on 06/09/2006 that was incomplete due to marked tortuosity. There is also an EGD on 02/20/2013 at which time an esophageal dilatation was done. There was an EGD also done previously with an attempt at pyloric dilatation. Her last upper GI series was 07/09/2010.   The patient's weight has improved since her problems with the gastric outlet obstruction last year. At that time, she was around 70 to 72 pounds. She is now in the 90 pound range with some variability but has not dropped below that. She states that she continues to get some intermittent nausea and did have episodes of dry heaves on this  particular presentation, likely exacerbated by the obstruction of the left ureter. Currently, she states that the abdominal pain is much improved. She has had no further emesis, although she will have a dry heaves on occasion. Her baseline is that of intermittent nausea. She does feel that this may be increasing from last year, after her gastroenteric stent. She has not followed up with Dr. Josetta Young for at least 6 to 8 months. She has no appointment for follow up with him. She states that her more recent nausea outside of this episode of hospitalization will occur perhaps once a week. She does take nausea medications. It is of note that the patient takes Excedrin on a daily basis for headaches. She had several concerns about taking other medications including concern about people thinking that she was using too many pain medications, etc. I tried to reassure that that would not be the case in a patient with chronic headache treatment.   I did discuss with her, however, that daily use of aspirin product in the setting of her particular stomach disorder would not be a good idea and indeed would likely be contraindicated. She had not taken her pantoprazole for a couple of days prior to coming into the hospital and that medication should be continued as an outpatient daily. In regards to the finding of the common bile duct, it is of note that there has been some "progressive distention of the biliary tree with the common bile duct measuring up to 14 mm." She does have a history of cholecystectomy and  this was done over 25 years ago at [facility]. It is possible that this dilatation is consistent with physiologic/morphologic changes status post cholecystectomy.   PAST MEDICAL HISTORY: In addition to above: 1. History of hyperlipidemia.  2. History of GI bleed secondary to peptic ulcer disease, remotely begun with aspirin use.  3. Kidney stones, recurrent.  4. Degenerative joint disease.  5. Migraine headaches.   6. Anemia.  7. History of tonsillectomy.  8. Breast biopsy.  9. C-section, remote.  10. Partial gastrectomy with vagotomy as noted above. Endoscopic placement of gastroenteric stent due to gastric outlet obstruction and failure of dilatation to alleviate that as a problem. She had apparently recurrent bezoar.  11. Hip bursa repair.   ALLERGIES: SHE LISTS ALLERGIES: ASPIRIN, DILAUDID, IRON, NSAIDS, PENICILLIN, VENOFER. HOWEVER, IT IS OF NOTE THAT THE PATIENT DOES TAKE EXCEDRIN ON A NEAR DAILY BASIS.   FAMILY HISTORY: Negative for colorectal cancer, liver disease or ulcers.   SOCIAL HISTORY: Former smoker, quit about 27 years ago. She lives with her husband. She does not use alcohol or other substances.   OUTPATIENT MEDICATIONS: Include acetaminophen/oxycodone 325/5 mg, amitriptyline 50 mg once a day at bedtime, fluoxetine 10 mg a day, oxybutynin 5 mg 3 times a day, oyster shell calcium with vitamin D, pantoprazole 40 mg twice a day, promethazine 25 mg every 6 hours p.r.n., tamsulosin 0.4 mg once a day, vitamin D3 at 1000 international units daily.   PHYSICAL EXAMINATION: VITAL SIGNS: Temperature is 98, pulse 92, respirations 18, blood pressure 112/68, pulse oximetry 96%.  GENERAL: She is a 74 year old Caucasian female in no acute distress.  HEENT: Normocephalic, atraumatic. Eyes are anicteric. Nose: Septum midline. Oropharynx: No lesions.  NECK: No JVD.  HEART: Regular rate and rhythm without rub or gallop.  LUNGS: Clear.  ABDOMEN: Soft. There is no tenderness to palpation. Bowel sounds positive, normoactive. There is no apparent organomegaly or masses felt.  RECTAL: Anorectal exam is deferred.  EXTREMITIES: No clubbing, cyanosis, or edema.  NEUROLOGICAL: Cranial nerves II through XII grossly intact. Muscle strength bilaterally equal and symmetric.   LABORATORY AND RADIOLOGICAL DATA: On admission, she had a creatinine of 0.74, sodium 134, potassium 3.0, chloride 102. Hepatic profile  showing a total protein of 6.3, albumin 3.3, total bilirubin 0.2, direct less than 0.1, alkaline phosphatase 134, AST 21, ALT 13. Hemogram showing a white count of 13.0, hemoglobin and hematocrit of 11.4/37.7, platelet count of 390,000. MCV was 74. Repeat laboratories after her ureteral stent placement showed a normal metabolic panel with improved liver enzymes as follows: She had an albumin of 2.7, bilirubin 0.6, alkaline phosphatase 126, AST of 18, ALT of 12. CT scan as noted above.   ASSESSMENT: Dilated biliary tree/common bile duct. It is possible that this is physiologic in the setting of previous gallbladder surgery, although 14 mm would be somewhat large and upper end of expected. On CT scanning, there was no evidence of an obstructive lesion, however, she could have biliary stenosis. She has a history of multiple gastric surgeries including complication of gastric outlet obstruction at the site of the surgery, necessitating the placement of a gastroduodenal stent for ultimate drainage of the stomach. It is quite likely that it may be impossible to reach the duodenal papilla for her ERCP purposes and I am unfamiliar with the type of stent placed from the stomach to the small intestine and may be a contraindication toward MRCP.    ____________________________ Ashley Deem, MD mus:TT D: 10/29/2014 17:35:47 ET  T: 10/29/2014 17:57:24 ET JOB#: [id number]  cc: Ashley DeemMartin U. Skulskie, MD, <Dictator> Ashley DeemMARTIN U SKULSKIE MD ELECTRONICALLY SIGNED 11/08/2014 14:26

## 2014-11-27 NOTE — Consult Note (Signed)
Brief Consult Note: Diagnosis: dilated CBD.   Patient was seen by consultant.   Consult note dictated.   Discussed with Attending MD.   Comments: Please see full GI consult [phone] and [phone].  Electronic Signatures: Barnetta ChapelSkulskie, Martin (MD)  (Signed 02-Apr-16 17:41)  Authored: Brief Consult Note   Last Updated: 02-Apr-16 17:41 by Barnetta ChapelSkulskie, Martin (MD)

## 2014-11-27 NOTE — Consult Note (Signed)
Chief Complaint:  Subjective/Chief Complaint Doing better this AM, pain and nausea improved but no completely resolved.  Voiding well.  No fevers or chills.   VITAL SIGNS/ANCILLARY NOTES: **Vital Signs.:   02-Apr-16 09:35  Vital Signs Type Q 8hr  Temperature Temperature (F) 98  Celsius 36.6  Temperature Source oral  Pulse Pulse 92  Respirations Respirations 18  Systolic BP Systolic BP 846  Diastolic BP (mmHg) Diastolic BP (mmHg) 68  Mean BP 82  Pulse Ox % Pulse Ox % 96  Pulse Ox Activity Level  At rest  Oxygen Delivery Room Air/ 21 %  *Intake and Output.:   Daily 02-Apr-16 07:00  Grand Totals Intake:  1549.64 Output:  1350    Net:  199.64 24 Hr.:  199.64  Oral Intake      In:  0  IV (Primary)      In:  1549.64  Urine ml     Out:  1350  Length of Stay Totals Intake:  1782.64 Output:  1650    Net:  132.64   Brief Assessment:  GEN no acute distress, thin   Cardiac Regular   Respiratory normal resp effort  no use of accessory muscles   Gastrointestinal Normal  No CVA tenderness bilaterally   Gastrointestinal details normal Soft  Nontender  Nondistended  No gaurding  No rigidity   EXTR negative cyanosis/clubbing   Lab Results: Routine Chem:  02-Apr-16 05:35   Glucose, Serum 86 (65-99 NOTE: New Reference Range  10/04/14)  BUN 6 (6-20 NOTE: New Reference Range  10/04/14)  Creatinine (comp) 0.49 (0.44-1.00 NOTE: New Reference Range  10/04/14)  Sodium, Serum 137 (135-145 NOTE: New Reference Range  10/04/14)  Potassium, Serum 3.5 (3.5-5.1 NOTE: New Reference Range  10/04/14)  Chloride, Serum 102 (101-111 NOTE: New Reference Range  10/04/14)  CO2, Serum 28 (22-32 NOTE: New Reference Range  10/04/14)  Anion Gap 7  eGFR (African American) >60  eGFR (Non-African American) >60 (eGFR values <68mL/min/1.73 m2 may be an indication of chronic kidney disease (CKD). Calculated eGFR is useful in patients with stable renal function. The eGFR calculation will not  be reliable in acutely ill patients when serum creatinine is changing rapidly. It is not useful in patients on dialysis. The eGFR calculation may not be applicable to patients at the low and high extremes of body sizes, pregnant women, and vegetarians.)  Routine Hem:  02-Apr-16 05:35   WBC (CBC) 7.8  RBC (CBC) 4.25  Hemoglobin (CBC)  9.7  Hematocrit (CBC)  31.4  Platelet Count (CBC) 352  MCV  74  MCH  22.9  MCHC  30.9  RDW  17.6  Neutrophil % 82.4  Lymphocyte % 9.7  Monocyte % 6.8  Eosinophil % 0.3  Basophil % 0.8  Neutrophil # 6.4  Lymphocyte #  0.8  Monocyte # 0.5  Eosinophil # 0.0  Basophil # 0.1 (Result(s) reported on 29 Oct 2014 at [facility].)   Assessment/Plan:  Assessment/Plan:  Assessment 74 yo F POD1 s/p left ureteral stent placement for 9 mm left proximal ureteral stone.  Doing better this AM.   Plan 1) OK for d/c per Urology 2) Please d/c home on Flomax 0.4 mg daily and ditropan 5 mg po tid prn bladder spasms for stent pain 3) f/u Urology clinic in 1-2 weeks to arrange definative stone treatment, discussed ESWL vs URS today briefly  Urology to sign off, please page with any questions or concerns   Electronic Signatures: Sherlynn Stalls (MD)  (Signed  02-Apr-16 11:06)  Authored: Chief Complaint, VITAL SIGNS/ANCILLARY NOTES, Brief Assessment, Lab Results, Assessment/Plan   Last Updated: 02-Apr-16 11:06 by Sherlynn Stalls (MD)

## 2014-11-27 NOTE — Consult Note (Signed)
Brief Consult Note: Diagnosis: Left ureteral stone, hydronephrosis.   Consult note dictated.   Recommend to proceed with surgery or procedure.   Comments: Patient seen/ examined/ labs reviewed.  74 yo F with 9 mm proximal uretearal stone, hydronephrosis with poorly controlled pain/ nausea/ vomiting.  Discussed plan for left ureteral stent placement.   -remain NPO -add on OR for left ureteral stent placement -consent order placed  Dictated consult note to follow.  Electronic Signatures: Claris GladdenBrandon, Ashan Cueva J (MD)  (Signed 01-Apr-16 07:37)  Authored: Brief Consult Note   Last Updated: 01-Apr-16 07:37 by Claris GladdenBrandon, Lajuana Patchell J (MD)

## 2014-11-27 NOTE — Consult Note (Signed)
Brief Consult Note: Diagnosis: left flank pain.   Patient was seen by consultant.   Comments: Attempt to see patient: she is in procedure currently.  Electronic Signatures: Vevelyn PatLondon, Thedford Bunton H (NP)  (Signed 01-Apr-16 14:21)  Authored: Brief Consult Note   Last Updated: 01-Apr-16 14:21 by Keturah BarreLondon, Lorette Peterkin H (NP)

## 2014-11-27 NOTE — Consult Note (Signed)
Brief Consult Note: Diagnosis: flank pain.   Comments: attempt to see patient- still not in room.  Electronic Signatures: Vevelyn PatLondon, Raven Furnas H (NP)  (Signed 01-Apr-16 15:23)  Authored: Brief Consult Note   Last Updated: 01-Apr-16 15:23 by Keturah BarreLondon, Apphia Cropley H (NP)

## 2014-11-27 NOTE — Discharge Summary (Signed)
PATIENT NAME:  Ashley Young, Ashley Young MR#:  [id number] DATE OF BIRTH:  Dec 21, 1940  DATE OF ADMISSION:  11/09/2014 DATE OF DISCHARGE:  11/11/2014  PRESENTING COMPLAINT: Abdominal pain.   DISCHARGE DIAGNOSES: 1. Left flank pain with nausea, improved.  2. History of left ureteral stent placement.  3. Urinary tract infection.  4. Bilateral chronic nephrolithiasis.  5. History of biliary stricture.   CODE STATUS: Full code.   MEDICATIONS: 1. Amitriptyline 50 mg p.o. at bedtime.  2. Protonix 40 mg Young.i.d.  3. Promethazine 25 mg 1 tablet every 6 hours as needed.  4. Fluoxetine 10 mg p.o. daily.  5. Flomax 0.04 mg p.o. daily.  6. Vitamin D3 one tablet p.o. daily at bedtime.  7. Excedrin Migraine 2 tablets every 6 hours as needed.  8. Acetaminophen 2 tablets every 4 hours as needed.  9. Keflex 500 mg Young.i.d.  10. Boost Breeze 3 times a day.  11. Latanoprost 0.005% ophthalmic drop to affected eye at bedtime.   FOLLOWUP: With Dr. Vanna ScotlandAshley Young for your ureteral stent. [facility] for followup on your biliary stricture.   CONDITION ON DISCHARGE: Fair.   PHYSICAL EXAMINATION AT DISCHARGE:   VITAL SIGNS: Temperature is 98.3. Pulse is 89. Blood pressure is 118/73. Saturations are 97% on room air.  HEENT: Atraumatic, normocephalic. PERRLA. EOM intact. Oral mucosa is moist.  NECK: Supple. No JVD. No carotid bruit.  LUNGS: Clear to auscultation bilaterally. No rales, rhonchi, respiratory distress.  CARDIOVASCULAR: Both heart sounds are normal. Rate, rhythm regular. PMI not lateralized. Chest is nontender.  ABDOMEN: Soft, benign, and mild diffuse tenderness in the left flank. No guarding, rigidity or mass felt.   CONSULTATIONS: None.   PROCEDURES: None.   BRIEF SUMMARY OF HOSPITAL COURSE: Ms Ashley Young is a 74 year old patient with a history of biliary stricture and bilateral chronic nephrolithiasis who recently had a left ureteral stent placement a couple of weeks ago, comes  in with:  1. Nausea, poor p.o. intake, tachycardia, hyperkalemia and hypokalemia, which was secondary likely to dehydration from poor p.o. intake. She received IV fluids, potassium. She was [facility] strict[facility]; however, her LFTs were normal. She did not have any complaints of pain on the right side of her abdomen. [facility]. The patient's intake improved. She was given Boost Breeze to help her get her nutritional supplement.  2. Left ureteral stone status post stent placement. Renal function is stable. She will see Dr. Apolinar Young as an outpatient once the issue with nausea resolves. UA was positive for UTI and urine culture so far negative, however, with overall presentation, we will give antibiotic for UTI. She did not get any antibiotics after her recent left ureteral stent placement.  3. Sinus tachycardia, resolved.  4. Chronic poor appetite. Starting Boost 3 times a day, which the patient has tolerated.  5. Overall hospital stay otherwise remained stable.   Discharge plan was discussed with the patient and they were agreeable to it. [facility]   TIME SPENT: 40 minutes.    ____________________________ Wylie HailSona A. Allena KatzPatel, MD sap:TT D: 11/12/2014 07:04:57 ET T: 11/12/2014 16:20:59 ET JOB#: [id number]  cc: Lorae Roig A. Allena KatzPatel, MD, <Dictator> Willow OraSONA A Montey Ebel MD ELECTRONICALLY SIGNED 11/16/2014 10:34

## 2014-11-27 NOTE — H&P (Signed)
PATIENT NAME:  Ashley Young, Ashley Young MR#:  [id number] DATE OF BIRTH:  1941/06/21  DATE OF ADMISSION:  10/28/2014  REFERRING PHYSICIAN:  Coolidge Breeze, MD   PRIMARY CARE PRACTITIONER:  Marisue Ivan, MD  ADMITTING PHYSICIAN:  Crissie Figures, MD  CHIEF COMPLAINT:  Left flank pain for the past 2 days.    HISTORY OF PRESENT ILLNESS:  This is a 74 year old Caucasian female with a past medical history of hyperlipidemia, gastrointestinal bleeding secondary due to peptic ulcer disease status post surgery, history of degenerative joint disease, kidney stones, migraine headaches, and chronic anemia, who presents to the Emergency Room with the complaints of left flank pain, which started the day prior to the ED arrival. The patient said that she developed left flank pain, hence came to the Emergency Room for further evaluation. Denies any fever. No chills. No dysuria. No frequency. No urgency. No hematuria. Denies passing any stones in the urine. The patient does have some nausea and vomiting; otherwise, denies any GI symptoms.   In the Emergency Room, the patient was evaluated by the ED physician and was found to be with stable vitals. The lab work was essentially unremarkable except for potassium of 3.0, and the CT of the abdomen and the pelvis revealed a 9 x 7 mm obstructing stone in the left ureteral as well as bilateral extensive nephrolithiasis. Urology on-call was consulted by the ED physician, who recommended admitting the patient to the hospitalist service for pain management and will consult in the morning. The patient was given multiple rounds of IV pain medications, following which her pain is under control at this time. She also received oral potassium.   PAST MEDICAL HISTORY: 1.  Hyperlipidemia.  2.  History of GI bleed secondary due to peptic ulcer disease status post surgery.  3.  Degenerative joint disease.  4.  Kidney stones.  5.  Migraine headaches.  6.  Anemia.   PAST  SURGICAL HISTORY:  1.  Tonsillectomy.  2.  Breast biopsy.  3.  C-section.  4.  Partial gastrectomy with vagotomy.  5.  Hip bursa repair.   ALLERGIES:  1.  NONSTEROIDAL ANTI-INFLAMMATORY DRUGS.  2.  ASPIRIN.  3.  IRON.  4.  PENICILLIN.   FAMILY HISTORY:  Mother with a history of heart problems and cancer and father with a history of cancer.  SOCIAL HISTORY:  She is married and lives with her husband. She is retired. Ex-smoker, quit about 27 years ago. Denies any alcohol or substance abuse.    HOME MEDICATIONS:  1.  Acetaminophen/hydrocodone 325/5 mg tablet 1 to 2 tablets every 6 hours as needed for pain.  2.  Amitriptyline 50 mg 1 tablet orally once a day at bedtime.  3.  Fluoxetine 10 mg oral capsule 1 capsule once a day.  4.  Pantoprazole 40 mg tablet 1 tablet orally 2 times a day.  5.  Senna Plus 50/8.6 mg oral tablet 2 tablets a day.  6.  Valium 2 mg oral tablet 1 to 2 tablets every 6 hours as needed for muscle spasms.  7.  Vitamin D 2000 international units oral tablet 1 tablet once a day.   REVIEW OF SYSTEMS: CONSTITUTIONAL:  Negative for fever or chills. No fatigue. Generalized weakness.  EYES:  Negative for blurred vision or double vision. No pain. No redness. No discharge.  EARS, NOSE, AND THROAT:  Negative for tinnitus, ear pain, hearing loss, epistaxis, and nasal discharge.  RESPIRATORY:  Negative for cough, wheezing, dyspnea, hemoptysis,  or painful respiration.  CARDIOVASCULAR:  Negative for chest pain, palpitations, dizziness, syncopal episodes, orthopnea, dyspnea on exertion, or pedal edema.  GASTROINTESTINAL:  Negative for abdominal pain. Positive for nausea and vomiting. No diarrhea. No constipation. No hematemesis. No melena. No rectal bleeding.   GENITOURINARY:  Left flank pain present. Negative for dysuria, frequency, urgency, hematuria, or incontinence.  ENDOCRINE:  Negative for polyuria, nocturia, or heat or cold intolerance.  HEMATOLOGIC AND LYMPHATIC:   Negative for easy bruising or bleeding or swollen glands.  INTEGUMENTARY:  Negative for acne, skin rash, or lesions.  MUSCULOSKELETAL:  Negative for neck or back pain.  NEUROLOGICAL:  Negative for focal weakness or numbness. No history of CVA, TIA, or seizure disorder. PSYCHIATRIC:  Positive for depression, stable on medications.   PHYSICAL EXAMINATION: VITAL SIGNS:  Temperature is 98.1 degrees Fahrenheit, pulse rate 111 per minute, respirations 18 per minute, blood pressure 150/72, oxygen saturation 99% on room air.  GENERAL:  Well developed, well nourished, alert, in no acute distress, comfortably resting in the bed.  HEAD:  Atraumatic, normocephalic.  EYES:  Pupils are equal and reactive to light and accommodation. No conjunctival pallor. No icterus. Extraocular movements are intact. NOSE:  No drainage. No lesions.  EARS:  No drainage. No external lesions.  ORAL CAVITY:  No mucosal lesions. No exudates.  NECK:  Supple. No JVD. No thyromegaly. No carotid bruit. Range of motion of the neck is within normal limits.  RESPIRATORY:  Good respiratory effort. Not using accessory muscles of respiration. Bilateral vesicular breath sounds present. No rales or rhonchi.  CARDIOVASCULAR:  S1 and S2, regular. Normal murmurs, gallops, or clicks. Pulses are equal at carotid, femoral, and pedal pulses. No peripheral edema.  GASTROINTESTINAL:  Abdomen is soft and nontender. No hepatosplenomegaly. No masses. No rigidity. No guarding. Bowel sounds are present and equal in all 4 quadrants. Mild tenderness in the left flank.  GENITOURINARY:  Deferred.  MUSCULOSKELETAL:  No joint tenderness or effusion. Range of motion is adequate. Strength and tone are equal bilaterally.  SKIN:  Inspection is within normal limits. No obvious wounds.  LYMPHATIC:  No cervical lymphadenopathy.  VASCULAR:  Good dorsalis pedis and posterior tibial pulses.  NEUROLOGIC:  Alert, awake, and oriented x 3. Cranial nerves II through XII are  grossly intact. No sensory deficit. Motor strength is 5/5 in both lower and upper extremities. DTRs are 2+ bilaterally and symmetrical. Plantars are downgoing.  PSYCHIATRIC:  Alert, awake, and oriented x 3. Judgment and insight are adequate. Memory and mood are within normal limits.   LABORATORY DATA:  Serum glucose is 111, BUN 11, creatinine 0.74, sodium 134, potassium 3.0, chloride 102, bicarbonate 24, and total calcium is 7.9. WBC is 13.0, hemoglobin 11.4, hematocrit 37.7, and platelet count is 390,000. Urinalysis:  WBC none, bacteria none, leukocyte esterase negative, nitrite negative.    IMAGING STUDIES:   CT of the abdomen and the pelvis:   1.  Obstructing 9 x 7 mm stone in the proximal left ureter.  2.  Extensive bilateral nephrolithiasis.  3.  Progressive biliary dilatation since 2014 raises the possibility of stricture.  4.  Patent gastroenteric anastomosis after stenting.  ASSESSMENT AND PLAN:  This is a 74 year old Caucasian female with a history of nephrolithiasis, who presents with left flank pain with nausea and vomiting and found to have 9 x 7 mm obstructing stone in the left proximal ureter and bilateral extensive nephrolithiasis.  1.  Left flank pain secondary due to left urolithiasis with a 9  x 7 mm obstructive stone in the proximal part of the left ureter and bilateral extensive nephrolithiasis. Plan:  Admit to medical floor. IV fluids and IV pain control measures. Urology consultation is requested for further management.  2.  Hypokalemia, mild. Plan:  Replace potassium. Follow BMP.  3.  Biliary dilatation seen on CT of the abdomen. GI examination is unremarkable. Plan:  We will check liver function tests and request for a GI consultation for further advice.  4.  Deep vein thrombosis prophylaxis with subcutaneous Lovenox.  5.  Gastrointestinal prophylaxis with proton pump inhibitor.   CODE STATUS:  Full code.   TIME SPENT:  50 minutes.      ____________________________ Crissie Figures, MD enr:nb D: 10/28/2014 03:42:39 ET T: 10/28/2014 05:04:30 ET JOB#: [id number]  cc: Crissie Figures, MD, <Dictator> Marisue Ivan, MD Crissie Figures MD ELECTRONICALLY SIGNED 10/28/2014 18:28

## 2014-11-30 ENCOUNTER — Emergency Department: Payer: Medicare Other

## 2014-11-30 ENCOUNTER — Emergency Department
Admission: EM | Admit: 2014-11-30 | Discharge: 2014-12-01 | Disposition: A | Discharge status: Home / Self Care | Payer: Medicare Other | Attending: Emergency Medicine | Admitting: Emergency Medicine

## 2014-11-30 ENCOUNTER — Encounter: Payer: Self-pay | Admitting: *Deleted

## 2014-11-30 DIAGNOSIS — Z87891 Personal history of nicotine dependence: Secondary | ICD-10-CM | POA: Insufficient documentation

## 2014-11-30 DIAGNOSIS — N390 Urinary tract infection, site not specified: Secondary | ICD-10-CM | POA: Insufficient documentation

## 2014-11-30 DIAGNOSIS — Z880 Allergy status to penicillin: Secondary | ICD-10-CM | POA: Diagnosis not present

## 2014-11-30 DIAGNOSIS — A499 Bacterial infection, unspecified: Secondary | ICD-10-CM

## 2014-11-30 DIAGNOSIS — R109 Unspecified abdominal pain: Secondary | ICD-10-CM | POA: Diagnosis present

## 2014-11-30 DIAGNOSIS — N200 Calculus of kidney: Secondary | ICD-10-CM | POA: Diagnosis not present

## 2014-11-30 HISTORY — DX: Unspecified osteoarthritis, unspecified site: M19.90

## 2014-11-30 HISTORY — DX: Disorder of kidney and ureter, unspecified: N28.9

## 2014-11-30 LAB — BASIC METABOLIC PANEL
Anion gap: 14 (ref 5–15)
BUN: 17 mg/dL (ref 6–20)
CHLORIDE: 98 mmol/L — AB (ref 101–111)
CO2: 25 mmol/L (ref 22–32)
CREATININE: 0.82 mg/dL (ref 0.44–1.00)
Calcium: 8.8 mg/dL — ABNORMAL LOW (ref 8.9–10.3)
GFR calc non Af Amer: >60 mL/min (ref >60)
GLUCOSE: 124 mg/dL — AB (ref 65–99)
Potassium: 3.1 mmol/L — ABNORMAL LOW (ref 3.5–5.1)
Sodium: 137 mmol/L (ref 135–145)

## 2014-11-30 LAB — CBC
HEMATOCRIT: 40.5 % (ref 35.0–47.0)
Hemoglobin: 12.5 g/dL (ref 12.0–16.0)
MCH: 22.9 pg — ABNORMAL LOW (ref 26.0–34.0)
MCHC: 30.9 g/dL — AB (ref 32.0–36.0)
MCV: 74.2 fL — AB (ref 80.0–100.0)
Platelets: 574 10*3/uL — ABNORMAL HIGH (ref 150–440)
RBC: 5.46 MIL/uL — ABNORMAL HIGH (ref 3.80–5.20)
RDW: 20.3 % — AB (ref 11.5–14.5)
WBC: 12.8 10*3/uL — ABNORMAL HIGH (ref 3.6–11.0)

## 2014-11-30 LAB — URINALYSIS COMPLETE WITH MICROSCOPIC (ARMC ONLY)
BACTERIA UA: NONE SEEN
Bilirubin Urine: NEGATIVE
Glucose, UA: NEGATIVE mg/dL
Nitrite: NEGATIVE
PROTEIN: 100 mg/dL — AB
SQUAMOUS EPITHELIAL / LPF: NONE SEEN
Specific Gravity, Urine: 1.023 (ref 1.005–1.030)
pH: 5 (ref 5.0–8.0)

## 2014-11-30 MED ORDER — SODIUM CHLORIDE 0.9 % IV BOLUS (SEPSIS)
1000.0000 mL | Freq: Once | INTRAVENOUS | Status: AC
  Administered 2014-11-30: 1000 mL via INTRAVENOUS

## 2014-11-30 MED ORDER — ONDANSETRON HCL 4 MG/2ML IJ SOLN
INTRAMUSCULAR | Status: AC
  Administered 2014-11-30: 4 mg via INTRAVENOUS
  Filled 2014-11-30: qty 2

## 2014-11-30 MED ORDER — ONDANSETRON HCL 4 MG/2ML IJ SOLN
4.0000 mg | Freq: Once | INTRAMUSCULAR | Status: AC
  Administered 2014-11-30: 4 mg via INTRAVENOUS

## 2014-11-30 MED ORDER — MORPHINE SULFATE 2 MG/ML IJ SOLN
INTRAMUSCULAR | Status: AC
  Administered 2014-11-30: 2 mg via INTRAVENOUS
  Filled 2014-11-30: qty 1

## 2014-11-30 MED ORDER — MORPHINE SULFATE 2 MG/ML IJ SOLN
2.0000 mg | Freq: Once | INTRAMUSCULAR | Status: AC
  Administered 2014-11-30: 2 mg via INTRAVENOUS

## 2014-11-30 NOTE — ED Provider Notes (Signed)
[facility] Emergency Department Provider Note    ____________________________________________  Time seen: 2300  I have reviewed the triage vital signs and the nursing notes.   HISTORY  Chief Complaint Flank Pain    HPI Ashley Young is a 74 y.o. female presents with sharp left flank pain 1 day severity of pain was initially 10 now 3. Pain is described as sharp. Of note patient underwent lithotripsy performed by Dr. Apolinar JunesBrandon last week. Patient also noted discomfort with urination times one day. Pain is not worsened by anything or relieved by anything     Past Medical History  Diagnosis Date  . Arthritis   . Blood transfusion without reported diagnosis   . Renal disorder     Multiple episodes of kidney stones.     There are no active problems to display for this patient.   Past Surgical History  Procedure Laterality Date  . Abdominal surgery      Reconstructed stomach and 3 different surgeries.   Marland Kitchen. Appendectomy    . Breast surgery      cyst removal  . Cholecystectomy      No current outpatient prescriptions on file.  Allergies Aspirin; Iron polysaccharide; Penicillins; and Venofer  History reviewed. No pertinent family history.  Social History History  Substance Use Topics  . Smoking status: Former Smoker    Quit date: 11/29/1993  . Smokeless tobacco: Not on file  . Alcohol Use: 0.6 oz/week    1 Standard drinks or equivalent per week    Review of Systems  Constitutional: Negative for fever. Eyes: Negative for visual changes. ENT: Negative for sore throat. Cardiovascular: Negative for chest pain. Respiratory: Negative for shortness of breath. Gastrointestinal: Negative for abdominal pain, vomiting and diarrhea. Genitourinary:  Dysuria Musculoskeletal: Negative for back pain. Skin: Negative for rash. Neurological: Negative for headaches, focal weakness or numbness.   10-point ROS otherwise  negative.  ____________________________________________   PHYSICAL EXAM:  VITAL SIGNS: ED Triage Vitals  Enc Vitals Group     BP 11/30/14 2128 120/81 mmHg     Pulse Rate 11/30/14 2128 135     Resp 11/30/14 2128 20     Temp 11/30/14 2128 97.7 F (36.5 C)     Temp Source 11/30/14 2128 Oral     SpO2 11/30/14 2128 99 %     Weight 11/30/14 2128 80 lb (36.288 kg)     Height 11/30/14 2128 5\' 3"  (1.6 m)     Head Cir --      Peak Flow --      Pain Score 11/30/14 2129 8     Pain Loc --      Pain Edu? --      Excl. in GC? --      Constitutional: Alert and oriented. Well appearing and in no distress. Eyes: Conjunctivae are normal. PERRL. Normal extraocular movements. ENT   Head: Normocephalic and atraumatic.   Nose: No congestion/rhinnorhea.   Mouth/Throat: Mucous membranes are moist.   Neck: No stridor. Hematological/Lymphatic/Immunilogical: No cervical lymphadenopathy. Cardiovascular: Normal rate, regular rhythm. Normal and symmetric distal pulses are present in all extremities. No murmurs, rubs, or gallops. Respiratory: Normal respiratory effort without tachypnea nor retractions. Breath sounds are clear and equal bilaterally. No wheezes/rales/rhonchi. Gastrointestinal: Soft and nontender. No distention. No abdominal bruits. There is left CVA tenderness. Genitourinary: Left CVA Musculoskeletal: Nontender with normal range of motion in all extremities. No joint effusions.  No lower extremity tenderness nor edema. Neurologic:  Normal speech and  language. No gross focal neurologic deficits are appreciated. Speech is normal. No gait instability. Skin:  Skin is warm, dry and intact. No rash noted. Psychiatric: Mood and affect are normal. Speech and behavior are normal. Patient exhibits appropriate insight and judgment.  ____________________________________________    LABS (pertinent positives/negatives)  Urinalysis revealed too numerous to count white cells and positive  ketones.  ____________________________________________   EKG  None performed   ____________________________________________    RADIOLOGY  CT scan of abdomen and pelvis revealed multiple ureteral lithiasis, stent noted in the left ureter and appropriate placement with 3 adjacent stones. And moderate hydronephrosis. ____________________________________________   PROCEDURES     ____________________________________________   INITIAL IMPRESSION / ASSESSMENT AND PLAN / ED COURSE  Pertinent labs & imaging results that were available during my care of the patient were reviewed by me and considered in my medical decision making (see chart for details).  Patient received IV morphine and Zofran with marked improvement of pain initial pain score 10 at present 4. Patient discussed with Dr. Apolinar JunesBrandon urology. Dr. Apolinar JunesBrandon advise Cipro twice a day 7 days in addition to analgesic. Patient will be referred to follow up with Dr. Apolinar JunesBrandon approximately 24 hours. ____________________________________________   FINAL CLINICAL IMPRESSION(S) / ED DIAGNOSES  Final diagnoses:  Kidney stone on left side  UTI (urinary tract infection), bacterial     Darci Currentandolph N Christiaan Strebeck, MD 12/01/14 [id number]

## 2014-11-30 NOTE — ED Notes (Signed)
Pt went to CT

## 2014-11-30 NOTE — ED Notes (Signed)
Pt presents to ED for c/o left flank pain since 10/06/14. Pt has been seen by PCP, urology, GI, and [facility] ED x 2 for same. Pt had lithotripsy on 4/21 but has not had any f/u radiology. Pt also had a endoscopy on 4/29 and was advised all was normal, pain was likely from stone remaining and was advised to come to ED today. + hematuria.

## 2014-11-30 NOTE — ED Notes (Signed)
Pt returned from CT °

## 2014-12-01 MED ORDER — OXYCODONE-ACETAMINOPHEN 5-325 MG PO TABS
ORAL_TABLET | ORAL | Status: AC
  Administered 2014-12-01: 1 via ORAL
  Filled 2014-12-01: qty 1

## 2014-12-01 MED ORDER — CIPROFLOXACIN HCL 500 MG PO TABS
ORAL_TABLET | ORAL | Status: AC
  Administered 2014-12-01: 500 mg
  Filled 2014-12-01: qty 1

## 2014-12-01 MED ORDER — OXYCODONE-ACETAMINOPHEN 5-325 MG PO TABS
1.0000 | ORAL_TABLET | Freq: Once | ORAL | Status: AC
  Administered 2014-12-01: 1 via ORAL

## 2014-12-01 MED ORDER — CIPROFLOXACIN HCL 500 MG PO TABS: 500.0000 mg | ORAL_TABLET | Freq: Two times a day (BID) | ORAL | Status: AC

## 2014-12-01 MED ORDER — SODIUM CHLORIDE 0.9 % IV BOLUS (SEPSIS)
1000.0000 mL | Freq: Once | INTRAVENOUS | Status: AC
  Administered 2014-12-01: 1000 mL via INTRAVENOUS

## 2014-12-01 MED ORDER — MORPHINE SULFATE 2 MG/ML IJ SOLN
2.0000 mg | Freq: Once | INTRAMUSCULAR | Status: AC
  Administered 2014-12-01: 2 mg via INTRAVENOUS

## 2014-12-01 MED ORDER — MORPHINE SULFATE 2 MG/ML IJ SOLN
INTRAMUSCULAR | Status: AC
  Administered 2014-12-01: 2 mg via INTRAVENOUS
  Filled 2014-12-01: qty 1

## 2014-12-01 MED ORDER — OXYCODONE-ACETAMINOPHEN 5-325 MG PO TABS: 1.0000 | ORAL_TABLET | ORAL | Status: DC | PRN

## 2014-12-01 NOTE — Discharge Instructions (Signed)
Kidney Stones  Kidney stones (urolithiasis) are deposits that form inside your kidneys. The intense pain is caused by the stone moving through the urinary tract. When the stone moves, the ureter goes into spasm around the stone. The stone is usually passed in the urine.   CAUSES   · A disorder that makes certain neck glands produce too much parathyroid hormone (primary hyperparathyroidism).  · A buildup of uric acid crystals, similar to gout in your joints.  · Narrowing (stricture) of the ureter.  · A kidney obstruction present at birth (congenital obstruction).  · Previous surgery on the kidney or ureters.  · Numerous kidney infections.  SYMPTOMS   · Feeling sick to your stomach (nauseous).  · Throwing up (vomiting).  · Blood in the urine (hematuria).  · Pain that usually spreads (radiates) to the groin.  · Frequency or urgency of urination.  DIAGNOSIS   · Taking a history and physical exam.  · Blood or urine tests.  · CT scan.  · Occasionally, an examination of the inside of the urinary bladder (cystoscopy) is performed.  TREATMENT   · Observation.  · Increasing your fluid intake.  · Extracorporeal shock wave lithotripsy--This is a noninvasive procedure that uses shock waves to break up kidney stones.  · Surgery may be needed if you have severe pain or persistent obstruction. There are various surgical procedures. Most of the procedures are performed with the use of small instruments. Only small incisions are needed to accommodate these instruments, so recovery time is minimized.  The size, location, and chemical composition are all important variables that will determine the proper choice of action for you. Talk to your health care provider to better understand your situation so that you will minimize the risk of injury to yourself and your kidney.   HOME CARE INSTRUCTIONS   · Drink enough water and fluids to keep your urine clear or pale yellow. This will help you to pass the stone or stone fragments.  · Strain  all urine through the provided strainer. Keep all particulate matter and stones for your health care provider to see. The stone causing the pain may be as small as a grain of salt. It is very important to use the strainer each and every time you pass your urine. The collection of your stone will allow your health care provider to analyze it and verify that a stone has actually passed. The stone analysis will often identify what you can do to reduce the incidence of recurrences.  · Only take over-the-counter or prescription medicines for pain, discomfort, or fever as directed by your health care provider.  · Make a follow-up appointment with your health care provider as directed.  · Get follow-up X-rays if required. The absence of pain does not always mean that the stone has passed. It may have only stopped moving. If the urine remains completely obstructed, it can cause loss of kidney function or even complete destruction of the kidney. It is your responsibility to make sure X-rays and follow-ups are completed. Ultrasounds of the kidney can show blockages and the status of the kidney. Ultrasounds are not associated with any radiation and can be performed easily in a matter of minutes.  SEEK MEDICAL CARE IF:  · You experience pain that is progressive and unresponsive to any pain medicine you have been prescribed.  SEEK IMMEDIATE MEDICAL CARE IF:   · Pain cannot be controlled with the prescribed medicine.  · You have a fever or   shaking chills.  · The severity or intensity of pain increases over 18 hours and is not relieved by pain medicine.  · You develop a new onset of abdominal pain.  · You feel faint or pass out.  · You are unable to urinate.  MAKE SURE YOU:   · Understand these instructions.  · Will watch your condition.  · Will get help right away if you are not doing well or get worse.  Document Released: 07/15/2005 Document Revised: 03/17/2013 Document Reviewed: 12/16/2012  ExitCare® Patient Information ©2015  [employer]. This information is not intended to replace advice given to you by your health care provider. Make sure you discuss any questions you have with your health care provider.    Urinary Tract Infection  Urinary tract infections (UTIs) can develop anywhere along your urinary tract. Your urinary tract is your body's drainage system for removing wastes and extra water. Your urinary tract includes two kidneys, two ureters, a bladder, and a urethra. Your kidneys are a pair of bean-shaped organs. Each kidney is about the size of your fist. They are located below your ribs, one on each side of your spine.  CAUSES  Infections are caused by microbes, which are microscopic organisms, including fungi, viruses, and bacteria. These organisms are so small that they can only be seen through a microscope. Bacteria are the microbes that most commonly cause UTIs.  SYMPTOMS   Symptoms of UTIs may vary by age and gender of the patient and by the location of the infection. Symptoms in young women typically include a frequent and intense urge to urinate and a painful, burning feeling in the bladder or urethra during urination. Older women and men are more likely to be tired, shaky, and weak and have muscle aches and abdominal pain. A fever may mean the infection is in your kidneys. Other symptoms of a kidney infection include pain in your back or sides below the ribs, nausea, and vomiting.  DIAGNOSIS  To diagnose a UTI, your caregiver will ask you about your symptoms. Your caregiver also will ask to provide a urine sample. The urine sample will be tested for bacteria and white blood cells. White blood cells are made by your body to help fight infection.  TREATMENT   Typically, UTIs can be treated with medication. Because most UTIs are caused by a bacterial infection, they usually can be treated with the use of antibiotics. The choice of antibiotic and length of treatment depend on your symptoms and the type of bacteria causing  your infection.  HOME CARE INSTRUCTIONS  · If you were prescribed antibiotics, take them exactly as your caregiver instructs you. Finish the medication even if you feel better after you have only taken some of the medication.  · Drink enough water and fluids to keep your urine clear or pale yellow.  · Avoid caffeine, tea, and carbonated beverages. They tend to irritate your bladder.  · Empty your bladder often. Avoid holding urine for long periods of time.  · Empty your bladder before and after sexual intercourse.  · After a bowel movement, women should cleanse from front to back. Use each tissue only once.  SEEK MEDICAL CARE IF:   · You have back pain.  · You develop a fever.  · Your symptoms do not begin to resolve within 3 days.  SEEK IMMEDIATE MEDICAL CARE IF:   · You have severe back pain or lower abdominal pain.  · You develop chills.  · You   have nausea or vomiting.  · You have continued burning or discomfort with urination.  MAKE SURE YOU:   · Understand these instructions.  · Will watch your condition.  · Will get help right away if you are not doing well or get worse.  Document Released: 04/24/2005 Document Revised: 01/14/2012 Document Reviewed: 08/23/2011  ExitCare® Patient Information ©2015 [employer]. This information is not intended to replace advice given to you by your health care provider. Make sure you discuss any questions you have with your health care provider.

## 2014-12-05 ENCOUNTER — Other Ambulatory Visit: Payer: Self-pay | Admitting: Urology

## 2014-12-05 ENCOUNTER — Ambulatory Visit
Admission: RE | Admit: 2014-12-05 | Discharge: 2014-12-05 | Disposition: A | Discharge status: Home / Self Care | Payer: Medicare Other | Source: Ambulatory Visit | Attending: Urology | Admitting: Urology

## 2014-12-05 DIAGNOSIS — N200 Calculus of kidney: Secondary | ICD-10-CM | POA: Diagnosis present

## 2014-12-13 ENCOUNTER — Other Ambulatory Visit: Payer: Self-pay | Admitting: Urology

## 2014-12-13 DIAGNOSIS — N200 Calculus of kidney: Secondary | ICD-10-CM

## 2014-12-13 DIAGNOSIS — R109 Unspecified abdominal pain: Secondary | ICD-10-CM

## 2014-12-14 ENCOUNTER — Ambulatory Visit
Admission: RE | Admit: 2014-12-14 | Discharge: 2014-12-14 | Disposition: A | Discharge status: Home / Self Care | Payer: Medicare Other | Source: Ambulatory Visit | Attending: Urology | Admitting: Urology

## 2014-12-14 DIAGNOSIS — N200 Calculus of kidney: Secondary | ICD-10-CM

## 2014-12-14 DIAGNOSIS — R109 Unspecified abdominal pain: Secondary | ICD-10-CM | POA: Insufficient documentation

## 2014-12-22 ENCOUNTER — Other Ambulatory Visit: Payer: Self-pay | Admitting: Urology

## 2014-12-22 DIAGNOSIS — N200 Calculus of kidney: Secondary | ICD-10-CM

## 2014-12-28 ENCOUNTER — Ambulatory Visit: Payer: Medicare Other

## 2015-01-10 ENCOUNTER — Ambulatory Visit (INDEPENDENT_AMBULATORY_CARE_PROVIDER_SITE_OTHER): Payer: Medicare Other | Admitting: Urology

## 2015-01-10 ENCOUNTER — Encounter: Payer: Self-pay | Admitting: Urology

## 2015-01-10 VITALS — BP 120/75 | HR 111 | Ht 63.0 in | Wt 81.9 lb

## 2015-01-10 DIAGNOSIS — R312 Other microscopic hematuria: Secondary | ICD-10-CM

## 2015-01-10 DIAGNOSIS — R3129 Other microscopic hematuria: Secondary | ICD-10-CM

## 2015-01-10 DIAGNOSIS — M810 Age-related osteoporosis without current pathological fracture: Secondary | ICD-10-CM | POA: Insufficient documentation

## 2015-01-10 DIAGNOSIS — N200 Calculus of kidney: Secondary | ICD-10-CM

## 2015-01-10 LAB — URINALYSIS, COMPLETE
BILIRUBIN UA: NEGATIVE
Glucose, UA: NEGATIVE
KETONES UA: NEGATIVE
LEUKOCYTES UA: NEGATIVE
NITRITE UA: NEGATIVE
Protein, UA: NEGATIVE
RBC UA: NEGATIVE
SPEC GRAV UA: 1.015 (ref 1.005–1.030)
Urobilinogen, Ur: 0.2 mg/dL (ref 0.2–1.0)
pH, UA: 5.5 (ref 5.0–7.5)

## 2015-01-10 LAB — MICROSCOPIC EXAMINATION

## 2015-01-10 NOTE — Progress Notes (Signed)
01/10/2015 9:18 AM   Ashley Young 07-20-1941 [id number]  Referring provider: Marisue Ivan, MD [phone] [facility] [address] [facility] [place], [place] [address]  Chief Complaint  Patient presents with  . Follow-up    renal ultrasound results    HPI: 74 year-old female recently treated for a 9 mm proximal ureteral stone. She was seen as an inpatient while admitted on 10/28/2014 with a 9 mm proximal ureteral stone, nausea and vomiting. Her pain was poorly controlled her for she was taken to the operating room for LEFT ureteral stent placement.   During that hospitalization, she was seen and evaluated by GI for nausea vomiting, and dilated biliary duct. She does have chronic GI issues and has been followed by a gastroenterology at [facility] and has had several admissions in the recent past related to nausea/ vomting.   She underwent L ESWL on 11/17/14 which she tolerated well.  Her stent was subsequently removed in the office.    Follow up renal ultrasound on 12/14/14 showed no evidence of hydronephrosis.  She did have a 4 mm left lower pole.   Stone analysis shows stone composition 97% calcium oxalate monohydrate, 3% calcium phosphate.  Overall, she's doing very well. She is no complaints today. She sometimes feels a twinge on her left side but nothing like when she had the stone previously. No significant voiding symptoms today.  She does admit that she has difficulty drinking water. She does suffer from chronic nausea related to GI issues and therefore at times has poor by mouth intake.     PMH: Past Medical History  Diagnosis Date  . Arthritis   . Blood transfusion without reported diagnosis   . Renal disorder     Multiple episodes of kidney stones.     Surgical History: Past Surgical History  Procedure Laterality Date  . Abdominal surgery      Reconstructed stomach and 3 different surgeries.   Marland Kitchen Appendectomy    . Breast surgery      cyst removal  .  Cholecystectomy      Home Medications:    Medication List       This list is accurate as of: 01/10/15  9:18 AM.  Always use your most recent med list.               amitriptyline 50 MG tablet  Commonly known as:  ELAVIL     cephALEXin 500 MG capsule  Commonly known as:  KEFLEX     FLUoxetine 20 MG capsule  Commonly known as:  PROZAC     latanoprost 0.005 % ophthalmic solution  Commonly known as:  XALATAN     ondansetron 4 MG disintegrating tablet  Commonly known as:  ZOFRAN-ODT     oxybutynin 5 MG tablet  Commonly known as:  DITROPAN     oxyCODONE-acetaminophen 5-325 MG per tablet  Commonly known as:  PERCOCET/ROXICET     oxyCODONE-acetaminophen 5-325 MG per tablet  Commonly known as:  PERCOCET/ROXICET  Take 1 tablet by mouth every 4 (four) hours as needed for severe pain.     pantoprazole 40 MG tablet  Commonly known as:  PROTONIX     SUMAtriptan 100 MG tablet  Commonly known as:  IMITREX     tamsulosin 0.4 MG Caps capsule  Commonly known as:  FLOMAX     traMADol 50 MG tablet  Commonly known as:  ULTRAM     Vitamin D (Ergocalciferol) 50000 UNITS Caps capsule  Commonly known  as:  DRISDOL        Allergies:  Allergies  Allergen Reactions  . Penicillins Swelling and Rash  . Aspirin Other (See Comments)    GI bleed Stomach ulcers, bleeding   . Ferrous Gluconate Nausea Only    Cannot take in IV form  . Iron Polysaccharide Rash  . Nsaids Rash    Severe GI upset and prior ulcer  . Other Rash    Venofen- unknown  . Sulfamethoxazole-Trimethoprim Rash  . Venofer [Iron Sucrose] Rash    Family History: No family history on file.  Social History:  reports that she quit smoking about 21 years ago. She does not have any smokeless tobacco history on file. She reports that she drinks about 0.6 oz of alcohol per week. She reports that she does not use illicit drugs.   Physical Exam: BP 120/75 mmHg  Pulse 111  Ht  (1.6 m)  Wt 81 lb 14.4 oz (37.15  kg)  BMI 14.51 kg/m2  Constitutional:  Alert and oriented, No acute distress. HEENT: [place] AT, moist mucus membranes.  Trachea midline, no masses. Cardiovascular: No clubbing, cyanosis, or edema. Respiratory: Normal respiratory effort, no increased work of breathing. GI: Abdomen is soft, nontender, nondistended, no abdominal masses GU: No CVA tenderness.  Skin: No rashes, bruises or suspicious lesions. Neurologic: Grossly intact, no focal deficits, moving all 4 extremities. Psychiatric: Normal mood and affect.  Laboratory Data: Lab Results  Component Value Date   WBC 12.8* 11/30/2014   HGB 12.5 11/30/2014   HCT 40.5 11/30/2014   MCV 74.2* 11/30/2014   PLT 574* 11/30/2014    Lab Results  Component Value Date   CREATININE 0.82 11/30/2014     Urinalysis Results for orders placed or performed in visit on 01/10/15  Microscopic Examination  Result Value Ref Range   WBC, UA 0-5 0 -  5 /hpf   RBC, UA 3-10 (A) 0 -  2 /hpf   Epithelial Cells (non renal) 0-10 0 - 10 /hpf   Renal Epithel, UA 0-10 (A) None seen /hpf   Bacteria, UA Few None seen/Few  Urinalysis, Complete  Result Value Ref Range   Specific Gravity, UA 1.015 1.005 - 1.030   pH, UA 5.5 5.0 - 7.5   Color, UA Yellow Yellow   Appearance Ur Clear Clear   Leukocytes, UA Negative Negative   Protein, UA Negative Negative/Trace   Glucose, UA Negative Negative   Ketones, UA Negative Negative   RBC, UA Negative Negative   Bilirubin, UA Negative Negative   Urobilinogen, Ur 0.2 0.2 - 1.0 mg/dL   Nitrite, UA Negative Negative   Microscopic Examination See below:      Pertinent Imaging: RUS 12/14/14 IMPRESSION: No hydronephrosis. Probable nonobstructing 4 mm left lower pole renal calculus.  Assessment & Plan:    1. Calculus of kidney Stome composition reviewed.  We discussed general stone prevention techniques including drinking plenty water with goal of producing 2.5 L urine daily, increased citric acid intake,  avoidance of high oxalate containing foods, and decreased salt intake.  Information about dietary recommendations given today.  -f/u 1 year with KUB prior - Urinalysis, Complete  2. Microscopic hematuria Isolated microscopic hematuria likely related to recent GU manipulation. We will recheck UA adnexa visit, does not currently meet the guidelines for hematuria workup.   Return in about 1 year (around 01/10/2016) for KUB prior.  Vanna Scotland, MD  [facility] [address] [place], [place] [address] [phone]

## 2015-01-10 NOTE — Patient Instructions (Signed)
Dietary Guidelines to Help Prevent Kidney Stones  Your risk of kidney stones can be decreased by adjusting the foods you eat. The most important thing you can do is drink enough fluid. You should drink enough fluid to keep your urine clear or pale yellow. The following guidelines provide specific information for the type of kidney stone you have had.  GUIDELINES ACCORDING TO TYPE OF KIDNEY STONE  Calcium Oxalate Kidney Stones  · Reduce the amount of salt you eat. Foods that have a lot of salt cause your body to release excess calcium into your urine. The excess calcium can combine with a substance called oxalate to form kidney stones.  · Reduce the amount of animal protein you eat if the amount you eat is excessive. Animal protein causes your body to release excess calcium into your urine. Ask your dietitian how much protein from animal sources you should be eating.  · Avoid foods that are high in oxalates. If you take vitamins, they should have less than 500 mg of vitamin C. Your body turns vitamin C into oxalates. You do not need to avoid fruits and vegetables high in vitamin C.  Calcium Phosphate Kidney Stones  · Reduce the amount of salt you eat to help prevent the release of excess calcium into your urine.  · Reduce the amount of animal protein you eat if the amount you eat is excessive. Animal protein causes your body to release excess calcium into your urine. Ask your dietitian how much protein from animal sources you should be eating.  · Get enough calcium from food or take a calcium supplement (ask your dietitian for recommendations). Food sources of calcium that do not increase your risk of kidney stones include:  ¨ Broccoli.  ¨ Dairy products, such as cheese and yogurt.  ¨ Pudding.  Uric Acid Kidney Stones  · Do not have more than 6 oz of animal protein per day.  FOOD SOURCES  Animal Protein Sources  · Meat (all types).  · Poultry.  · Eggs.  · Fish, seafood.  Foods High in Salt  · Salt seasonings.  · Soy  sauce.  · Teriyaki sauce.  · Cured and processed meats.  · Salted crackers and snack foods.  · Fast food.  · Canned soups and most canned foods.  Foods High in Oxalates  · Grains:  ¨ Amaranth.  ¨ Barley.  ¨ Grits.  ¨ Wheat germ.  ¨ Bran.  ¨ Buckwheat flour.  ¨ All bran cereals.  ¨ Pretzels.  ¨ Whole wheat bread.  · Vegetables:  ¨ Beans (wax).  ¨ Beets and beet greens.  ¨ Collard greens.  ¨ Eggplant.  ¨ Escarole.  ¨ Leeks.  ¨ Okra.  ¨ Parsley.  ¨ Rutabagas.  ¨ Spinach.  ¨ Swiss chard.  ¨ Tomato paste.  ¨ Fried potatoes.  ¨ Sweet potatoes.  · Fruits:  ¨ Red currants.  ¨ Figs.  ¨ Kiwi.  ¨ Rhubarb.  · Meat and Other Protein Sources:  ¨ Beans (dried).  ¨ Soy burgers and other soybean products.  ¨ Miso.  ¨ Nuts (peanuts, almonds, pecans, cashews, hazelnuts).  ¨ Nut butters.  ¨ Sesame seeds and tahini (paste made of sesame seeds).  ¨ Poppy seeds.  · Beverages:  ¨ Chocolate drink mixes.  ¨ Soy milk.  ¨ Instant iced tea.  ¨ Juices made from high-oxalate fruits or vegetables.  · Other:  ¨ Carob.  ¨ Chocolate.  ¨ Fruitcake.  ¨ Marmalades.  Document Released:   11/09/2010 Document Revised: 07/20/2013 Document Reviewed: 06/11/2013  ExitCare® Patient Information ©2015 [employer]. This information is not intended to replace advice given to you by your health care provider. Make sure you discuss any questions you have with your health care provider.

## 2015-01-16 ENCOUNTER — Other Ambulatory Visit: Payer: Self-pay | Admitting: Family Medicine

## 2015-03-28 DIAGNOSIS — Z862 Personal history of diseases of the blood and blood-forming organs and certain disorders involving the immune mechanism: Secondary | ICD-10-CM | POA: Insufficient documentation

## 2015-04-11 ENCOUNTER — Inpatient Hospital Stay: Payer: Medicare Other | Admitting: Internal Medicine

## 2015-04-18 ENCOUNTER — Inpatient Hospital Stay: Payer: Medicare Other | Admitting: Internal Medicine

## 2015-04-19 ENCOUNTER — Ambulatory Visit: Payer: Medicare Other | Admitting: Internal Medicine

## 2015-04-24 ENCOUNTER — Inpatient Hospital Stay: Payer: Medicare Other | Attending: Internal Medicine | Admitting: Internal Medicine

## 2015-04-24 ENCOUNTER — Inpatient Hospital Stay: Payer: Medicare Other

## 2015-04-24 VITALS — BP 123/81 | HR 106 | Temp 96.4°F | Resp 18 | Ht 63.0 in | Wt 82.2 lb

## 2015-04-24 DIAGNOSIS — F329 Major depressive disorder, single episode, unspecified: Secondary | ICD-10-CM | POA: Insufficient documentation

## 2015-04-24 DIAGNOSIS — Z79899 Other long term (current) drug therapy: Secondary | ICD-10-CM | POA: Diagnosis not present

## 2015-04-24 DIAGNOSIS — E780 Pure hypercholesterolemia: Secondary | ICD-10-CM

## 2015-04-24 DIAGNOSIS — Z9049 Acquired absence of other specified parts of digestive tract: Secondary | ICD-10-CM | POA: Insufficient documentation

## 2015-04-24 DIAGNOSIS — D649 Anemia, unspecified: Secondary | ICD-10-CM

## 2015-04-24 DIAGNOSIS — E538 Deficiency of other specified B group vitamins: Secondary | ICD-10-CM | POA: Diagnosis not present

## 2015-04-24 DIAGNOSIS — Z803 Family history of malignant neoplasm of breast: Secondary | ICD-10-CM | POA: Insufficient documentation

## 2015-04-24 DIAGNOSIS — M129 Arthropathy, unspecified: Secondary | ICD-10-CM | POA: Diagnosis not present

## 2015-04-24 DIAGNOSIS — R5383 Other fatigue: Secondary | ICD-10-CM | POA: Diagnosis not present

## 2015-04-24 DIAGNOSIS — Z8719 Personal history of other diseases of the digestive system: Secondary | ICD-10-CM | POA: Insufficient documentation

## 2015-04-24 DIAGNOSIS — Z801 Family history of malignant neoplasm of trachea, bronchus and lung: Secondary | ICD-10-CM

## 2015-04-24 DIAGNOSIS — Z87442 Personal history of urinary calculi: Secondary | ICD-10-CM | POA: Insufficient documentation

## 2015-04-24 DIAGNOSIS — Z87891 Personal history of nicotine dependence: Secondary | ICD-10-CM | POA: Diagnosis not present

## 2015-04-24 DIAGNOSIS — F39 Unspecified mood [affective] disorder: Secondary | ICD-10-CM

## 2015-04-24 DIAGNOSIS — Z8711 Personal history of peptic ulcer disease: Secondary | ICD-10-CM

## 2015-04-24 DIAGNOSIS — D509 Iron deficiency anemia, unspecified: Secondary | ICD-10-CM | POA: Diagnosis present

## 2015-04-24 DIAGNOSIS — Z8669 Personal history of other diseases of the nervous system and sense organs: Secondary | ICD-10-CM | POA: Diagnosis not present

## 2015-04-24 LAB — IRON AND TIBC
IRON: 20 ug/dL — AB (ref 28–170)
Saturation Ratios: 4 % — ABNORMAL LOW (ref 10.4–31.8)
TIBC: 483 ug/dL — ABNORMAL HIGH (ref 250–450)
UIBC: 463 ug/dL

## 2015-04-24 LAB — FOLATE: FOLATE: 8.3 ng/mL (ref >5.9)

## 2015-04-24 LAB — CBC WITH DIFFERENTIAL/PLATELET
Basophils Absolute: 0.1 10*3/uL (ref 0–0.1)
Eosinophils Absolute: 0.1 10*3/uL (ref 0–0.7)
Eosinophils Relative: 3 %
HEMATOCRIT: 36.6 % (ref 35.0–47.0)
HEMOGLOBIN: 11.1 g/dL — AB (ref 12.0–16.0)
Lymphocytes Relative: 24 %
Lymphs Abs: 1.1 10*3/uL (ref 1.0–3.6)
MCH: 21.8 pg — ABNORMAL LOW (ref 26.0–34.0)
MCHC: 30.2 g/dL — AB (ref 32.0–36.0)
MCV: 72.1 fL — ABNORMAL LOW (ref 80.0–100.0)
Monocytes Absolute: 0.4 10*3/uL (ref 0.2–0.9)
NEUTROS ABS: 2.8 10*3/uL (ref 1.4–6.5)
Platelets: 441 10*3/uL — ABNORMAL HIGH (ref 150–440)
RBC: 5.07 MIL/uL (ref 3.80–5.20)
RDW: 16.9 % — ABNORMAL HIGH (ref 11.5–14.5)
WBC: 4.5 10*3/uL (ref 3.6–11.0)

## 2015-04-24 LAB — VITAMIN B12: Vitamin B-12: 170 pg/mL — ABNORMAL LOW (ref 180–914)

## 2015-04-24 LAB — CREATININE, SERUM: Creatinine, Ser: 0.59 mg/dL (ref 0.44–1.00)

## 2015-04-24 LAB — SAMPLE TO BLOOD BANK

## 2015-04-24 LAB — DAT, POLYSPECIFIC AHG (ARMC ONLY): Polyspecific AHG test: NEGATIVE

## 2015-04-24 LAB — RETICULOCYTES
RBC.: 5.07 MIL/uL (ref 3.80–5.20)
RETIC CT PCT: 1.2 % (ref 0.4–3.1)
Retic Count, Absolute: 60.8 10*3/uL (ref 19.0–183.0)

## 2015-04-24 LAB — FERRITIN: Ferritin: 19 ng/mL (ref 11–307)

## 2015-04-24 LAB — LACTATE DEHYDROGENASE: LDH: 180 U/L (ref 98–192)

## 2015-04-24 NOTE — Progress Notes (Signed)
Patient is referred back here by Dr. Burnadette Pop for microcytic anemia. Patient is intolerant to oral iron tabs. She last saw Dr. Sherrlyn Hock in October 2014. She states that she has been feeling very tired. She states that her last IV iron treatment was in 2014.

## 2015-04-25 ENCOUNTER — Other Ambulatory Visit: Payer: Self-pay | Admitting: *Deleted

## 2015-04-25 ENCOUNTER — Other Ambulatory Visit: Payer: Self-pay

## 2015-04-25 DIAGNOSIS — D649 Anemia, unspecified: Secondary | ICD-10-CM

## 2015-04-25 LAB — PROTEIN ELECTROPHORESIS, SERUM
A/G RATIO SPE: 1 (ref 0.7–1.7)
ALPHA-2-GLOBULIN: 0.8 g/dL (ref 0.4–1.0)
Albumin ELP: 3 g/dL (ref 2.9–4.4)
Alpha-1-Globulin: 0.3 g/dL (ref 0.0–0.4)
BETA GLOBULIN: 1.3 g/dL (ref 0.7–1.3)
GLOBULIN, TOTAL: 3.1 g/dL (ref 2.2–3.9)
Gamma Globulin: 0.6 g/dL (ref 0.4–1.8)
Total Protein ELP: 6.1 g/dL (ref 6.0–8.5)

## 2015-04-26 ENCOUNTER — Other Ambulatory Visit: Payer: Self-pay | Admitting: Internal Medicine

## 2015-04-26 DIAGNOSIS — E538 Deficiency of other specified B group vitamins: Secondary | ICD-10-CM | POA: Insufficient documentation

## 2015-04-26 DIAGNOSIS — D500 Iron deficiency anemia secondary to blood loss (chronic): Secondary | ICD-10-CM | POA: Insufficient documentation

## 2015-04-28 ENCOUNTER — Inpatient Hospital Stay: Payer: Medicare Other

## 2015-04-28 ENCOUNTER — Other Ambulatory Visit: Payer: Self-pay | Admitting: Internal Medicine

## 2015-04-28 VITALS — BP 109/65 | HR 94 | Temp 96.0°F | Resp 18

## 2015-04-28 DIAGNOSIS — E538 Deficiency of other specified B group vitamins: Secondary | ICD-10-CM

## 2015-04-28 DIAGNOSIS — D509 Iron deficiency anemia, unspecified: Secondary | ICD-10-CM

## 2015-04-28 MED ORDER — SODIUM CHLORIDE 0.9 % IV SOLN
INTRAVENOUS | Status: DC
  Administered 2015-04-28: 15:00:00 via INTRAVENOUS
  Filled 2015-04-28: qty 1000

## 2015-04-28 MED ORDER — SODIUM CHLORIDE 0.9 % IV SOLN
510.0000 mg | Freq: Once | INTRAVENOUS | Status: AC
  Administered 2015-04-28: 510 mg via INTRAVENOUS
  Filled 2015-04-28: qty 17

## 2015-04-28 MED ORDER — CYANOCOBALAMIN 1000 MCG/ML IJ SOLN
1000.0000 ug | Freq: Once | INTRAMUSCULAR | Status: AC
  Administered 2015-04-28: 1000 ug via INTRAMUSCULAR
  Filled 2015-04-28: qty 1

## 2015-05-01 ENCOUNTER — Inpatient Hospital Stay: Payer: Medicare Other | Attending: Internal Medicine

## 2015-05-01 DIAGNOSIS — E538 Deficiency of other specified B group vitamins: Secondary | ICD-10-CM

## 2015-05-01 DIAGNOSIS — D509 Iron deficiency anemia, unspecified: Secondary | ICD-10-CM | POA: Insufficient documentation

## 2015-05-01 LAB — KAPPA/LAMBDA LIGHT CHAINS
KAPPA FREE LGHT CHN: 27.31 mg/L — AB (ref 3.30–19.40)
KAPPA, LAMDA LIGHT CHAIN RATIO: 1.32 (ref 0.26–1.65)
LAMDA FREE LIGHT CHAINS: 20.71 mg/L (ref 5.71–26.30)

## 2015-05-01 LAB — INTRINSIC FACTOR ANTIBODIES: INTRINSIC FACTOR: 0.9 [AU]/ml (ref 0.0–1.1)

## 2015-05-01 LAB — HAPTOGLOBIN: Haptoglobin: 186 mg/dL (ref 34–200)

## 2015-05-01 MED ORDER — CYANOCOBALAMIN 1000 MCG/ML IJ SOLN
1000.0000 ug | Freq: Once | INTRAMUSCULAR | Status: AC
  Administered 2015-05-01: 1000 ug via INTRAMUSCULAR
  Filled 2015-05-01: qty 1

## 2015-05-02 ENCOUNTER — Inpatient Hospital Stay: Payer: Medicare Other

## 2015-05-02 DIAGNOSIS — D509 Iron deficiency anemia, unspecified: Secondary | ICD-10-CM | POA: Diagnosis not present

## 2015-05-02 DIAGNOSIS — E538 Deficiency of other specified B group vitamins: Secondary | ICD-10-CM

## 2015-05-02 MED ORDER — CYANOCOBALAMIN 1000 MCG/ML IJ SOLN
1000.0000 ug | Freq: Once | INTRAMUSCULAR | Status: AC
  Administered 2015-05-02: 1000 ug via INTRAMUSCULAR
  Filled 2015-05-02: qty 1

## 2015-05-05 ENCOUNTER — Inpatient Hospital Stay: Payer: Medicare Other

## 2015-05-05 VITALS — BP 124/80 | HR 80 | Resp 18

## 2015-05-05 DIAGNOSIS — E538 Deficiency of other specified B group vitamins: Secondary | ICD-10-CM

## 2015-05-05 DIAGNOSIS — D509 Iron deficiency anemia, unspecified: Secondary | ICD-10-CM

## 2015-05-05 MED ORDER — SODIUM CHLORIDE 0.9 % IV SOLN
510.0000 mg | Freq: Once | INTRAVENOUS | Status: AC
  Administered 2015-05-05: 510 mg via INTRAVENOUS
  Filled 2015-05-05: qty 17

## 2015-05-05 MED ORDER — CYANOCOBALAMIN 1000 MCG/ML IJ SOLN
1000.0000 ug | Freq: Once | INTRAMUSCULAR | Status: AC
  Administered 2015-05-05: 1000 ug via INTRAMUSCULAR
  Filled 2015-05-05: qty 1

## 2015-05-05 MED ORDER — SODIUM CHLORIDE 0.9 % IV SOLN
INTRAVENOUS | Status: DC
  Administered 2015-05-05: 14:00:00 via INTRAVENOUS
  Filled 2015-05-05: qty 1000

## 2015-05-06 NOTE — Progress Notes (Addendum)
[facility]  [phone] [phone]     ID: TIMMIE CALIX OB: February 07, 1941  MR#: [id number]  JYN#:[id number]  Patient Care Team: Marisue Ivan, MD as PCP - General (Family Medicine)  CHIEF COMPLAINT/DIAGNOSIS:  1. Iron deficiency anemia - on parenteral iron therapy. Had allergic reaction to Ferrous Gluconate injection. Intolerant to oral iron also.  Also had allergic reaction to Venofer in September/October 2010.  Received Feraheme IV in April 2011 and Sept 2014 without any adverse reaction.  May 2016 - Hb 12.5, Cr 0.82. 2. History of NSAID induced gastric ulcers.  3. History of B12 deficiency.  4. 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    HPI:   Patient returns for hematology evaluation, she had recent labs done by GI on 03/23/2013 which showed recurrent iron deficiency with serum iron low at 14, iron saturation of 5%, ferritin low normal range at 20, otherwise B12 and folate unremarkable, hemoglobin is 10.9  Clinically patient states that she is feeling progressive fatigue on exertion and sometimes at rest.  Denies any obvious bleeding symptoms.  Denies dyspnea, orthopnea or paroxysmal nocturnal dyspnea. No angina, dizziness or palpitations. No new bone pains.   REVIEW OF SYSTEMS:   ROS As in HPI above. In addition, no fevers or sweats. No new headaches or focal weakness.  No sore throat, cough, shortness of breath, sputum, hemoptysis or chest pain. No abdominal pain, constipation, diarrhea, dysuria or hematuria. No new skin rash or bleeding symptoms. No new paresthesias in extremities. No polyuria polydipsia.  PAST MEDICAL HISTORY: Reviewed. Past Medical History  Diagnosis Date  . Arthritis   . Blood transfusion without reported diagnosis   . Renal disorder     Multiple episodes of kidney stones.   Migraine headaches Recurrent GI bleeding Iron deficiency Hypercholesterolemia Depression B12 deficiency Multiple abdominal  surgeries related to her NSAID use including subtotal gastrectomy, gastrojejunostomy in 7/97 with revision of jejunostomy.   Status post cholecystectomy.   Status post colonoscopy and upper endoscopy in 2000.   Status post 3 c-sections.   Kyphoplasty L5 12/17/12, pathology showed reactive trabecular bone and marrow  PAST SURGICAL HISTORY: Reviewed. Past Surgical History  Procedure Laterality Date  . Abdominal surgery      Reconstructed stomach and 3 different surgeries.   Marland Kitchen Appendectomy    . Breast surgery      cyst removal  . Cholecystectomy     FAMILY HISTORY: Reviewed. Positive for breast cancer in mother at age 76. Positive for lung cancer in her father. Negative for colorectal cancer.  SOCIAL HISTORY: Reviewed. Social History  Substance Use Topics  . Smoking status: Former Smoker    Quit date: 11/29/1993  . Smokeless tobacco: Not on file  . Alcohol Use: 0.6 oz/week    1 Standard drinks or equivalent per week    Allergies  Allergen Reactions  . Penicillins Swelling and Rash  . Aspirin Other (See Comments)    GI bleed Stomach ulcers, bleeding   . Ferrous Gluconate Nausea Only    Cannot take in IV form  . Iron Polysaccharide Rash  . Nsaids Rash    Severe GI upset and prior ulcer  . Other Rash    Venofen- unknown  . Sulfamethoxazole-Trimethoprim Rash  . Venofer [Iron Sucrose] Rash    Current Outpatient Prescriptions  Medication Sig Dispense Refill  . amitriptyline (ELAVIL) 50 MG tablet     . FLUoxetine (PROZAC) 20 MG capsule     . latanoprost (XALATAN) 0.005 % ophthalmic solution     .  ondansetron (ZOFRAN-ODT) 4 MG disintegrating tablet     . oxybutynin (DITROPAN) 5 MG tablet     . oxyCODONE-acetaminophen (PERCOCET/ROXICET) 5-325 MG per tablet Take 1 tablet by mouth every 4 (four) hours as needed for severe pain. 30 tablet 0  . pantoprazole (PROTONIX) 40 MG tablet     . SUMAtriptan (IMITREX) 100 MG tablet     . tamsulosin (FLOMAX) 0.4 MG CAPS capsule     .  traMADol (ULTRAM) 50 MG tablet     . Vitamin D, Ergocalciferol, (DRISDOL) 50000 UNITS CAPS capsule     . cephALEXin (KEFLEX) 500 MG capsule     . oxyCODONE-acetaminophen (PERCOCET/ROXICET) 5-325 MG per tablet      No current facility-administered medications for this visit.    PHYSICAL EXAM: Filed Vitals:   04/24/15 1038  BP: 123/81  Pulse: 106  Temp: 96.4 F (35.8 C)  Resp: 18     Body mass index is 14.57 kg/(m^2).       GENERAL: Patient is alert and oriented and in no acute distress. No icterus. Mild pallor. HEENT: EOMs intact. No cervical lymphadenopathy. CVS: S1S2, regular LUNGS: Bilaterally clear to auscultation, no rhonchi. ABDOMEN: Soft, nontender. No hepatosplenomegaly clinically.  NEURO: grossly nonfocal, cranial nerves are intact.   EXTREMITIES: No pedal edema.   LAB RESULTS: Lab Results  Component Value Date   WBC 4.5 04/24/2015   NEUTROABS 2.8 04/24/2015   HGB 11.1* 04/24/2015   HCT 36.6 04/24/2015   MCV 72.1* 04/24/2015   PLT 441* 04/24/2015    Lab Results  Component Value Date   IRON 20* 04/24/2015  Serum B12 low at 170, folate normal, intrinsic factor antibody negative (0.9). Serum iron 20, TIBC 483, iron saturation 4%, ferritin 19.   STUDIES: 02/10/12 - hemoglobin of 11.5, MCV 81.9, WBC 7800, platelets 492, serum iron low at 20, ferritin low at 8, creatinine 0.6, liver functions unremarkable except alkaline phosphatase slightly elevated at 117 and albumin of 3.4. 05/22/12 - B12 and folate normal. 03/23/13 - serum iron low at 14, iron saturation of 5%, ferritin low normal range at 20, B12 and folate unremarkable, hemoglobin is 10.9.   ASSESSMENT / PLAN:   1.  Recurrent Iron-deficiency anemia - reviewed recent labs are discussed with patient. She has recurrent anemia, labs so far done today is indicative of severe iron deficiency. Plan therefore is to pursue another course of parenteral ion therapy and she is agreeable to this.  Will schedule for IV  Feraheme 510 mg IV x 2 doses, on 9/30 and 10/7. Patient denies any obvious bleeding symptoms, she continues to follow with GI. Will followup on 07/14/15 with repeat blood counts, iron studies and make further plan of management including decide if she would need continued parenteral iron therapy.  2.  Recurrent B12 deficiency - labs today shows Serum B12 low at 170, folate normal, intrinsic factor antibody negative (0.9). Will start on B12 injections and follow. 3.  In between visits, the patient has been advised to call or come to ER in case of any progressive anemia symptoms or acute sickness. Patient is agreeable to this plan.   Janese Banks, MD   05/06/2015 10:48 PM

## 2015-05-09 LAB — INTRINSIC FACTOR ANTIBODIES: Intrinsic Factor: 0.9 AU/mL (ref 0.0–1.1)

## 2015-05-12 ENCOUNTER — Inpatient Hospital Stay: Payer: Medicare Other

## 2015-05-12 DIAGNOSIS — E538 Deficiency of other specified B group vitamins: Secondary | ICD-10-CM

## 2015-05-12 DIAGNOSIS — D509 Iron deficiency anemia, unspecified: Secondary | ICD-10-CM | POA: Diagnosis not present

## 2015-05-12 MED ORDER — CYANOCOBALAMIN 1000 MCG/ML IJ SOLN
1000.0000 ug | Freq: Once | INTRAMUSCULAR | Status: AC
  Administered 2015-05-12: 1000 ug via INTRAMUSCULAR
  Filled 2015-05-12: qty 1

## 2015-05-19 ENCOUNTER — Inpatient Hospital Stay: Payer: Medicare Other

## 2015-05-19 DIAGNOSIS — E538 Deficiency of other specified B group vitamins: Secondary | ICD-10-CM

## 2015-05-19 DIAGNOSIS — D509 Iron deficiency anemia, unspecified: Secondary | ICD-10-CM | POA: Diagnosis not present

## 2015-05-19 MED ORDER — CYANOCOBALAMIN 1000 MCG/ML IJ SOLN
1000.0000 ug | Freq: Once | INTRAMUSCULAR | Status: AC
  Administered 2015-05-19: 1000 ug via INTRAMUSCULAR
  Filled 2015-05-19: qty 1

## 2015-05-31 ENCOUNTER — Encounter: Payer: Self-pay | Admitting: *Deleted

## 2015-06-05 ENCOUNTER — Encounter
Admission: RE | Disposition: A | Discharge status: Home / Self Care | Payer: Self-pay | Source: Ambulatory Visit | Attending: Ophthalmology

## 2015-06-05 ENCOUNTER — Ambulatory Visit
Admission: RE | Admit: 2015-06-05 | Discharge: 2015-06-05 | Disposition: A | Discharge status: Home / Self Care | Payer: Medicare Other | Source: Ambulatory Visit | Attending: Ophthalmology | Admitting: Ophthalmology

## 2015-06-05 ENCOUNTER — Encounter: Payer: Self-pay | Admitting: *Deleted

## 2015-06-05 ENCOUNTER — Ambulatory Visit: Payer: Medicare Other | Admitting: Anesthesiology

## 2015-06-05 DIAGNOSIS — K219 Gastro-esophageal reflux disease without esophagitis: Secondary | ICD-10-CM | POA: Insufficient documentation

## 2015-06-05 DIAGNOSIS — Z8711 Personal history of peptic ulcer disease: Secondary | ICD-10-CM | POA: Diagnosis not present

## 2015-06-05 DIAGNOSIS — Z87891 Personal history of nicotine dependence: Secondary | ICD-10-CM | POA: Insufficient documentation

## 2015-06-05 DIAGNOSIS — G43909 Migraine, unspecified, not intractable, without status migrainosus: Secondary | ICD-10-CM | POA: Diagnosis not present

## 2015-06-05 DIAGNOSIS — R51 Headache: Secondary | ICD-10-CM | POA: Diagnosis not present

## 2015-06-05 DIAGNOSIS — H2511 Age-related nuclear cataract, right eye: Secondary | ICD-10-CM | POA: Diagnosis present

## 2015-06-05 DIAGNOSIS — Z87442 Personal history of urinary calculi: Secondary | ICD-10-CM | POA: Insufficient documentation

## 2015-06-05 DIAGNOSIS — M1990 Unspecified osteoarthritis, unspecified site: Secondary | ICD-10-CM | POA: Insufficient documentation

## 2015-06-05 DIAGNOSIS — M25472 Effusion, left ankle: Secondary | ICD-10-CM | POA: Insufficient documentation

## 2015-06-05 DIAGNOSIS — F418 Other specified anxiety disorders: Secondary | ICD-10-CM | POA: Diagnosis not present

## 2015-06-05 DIAGNOSIS — M810 Age-related osteoporosis without current pathological fracture: Secondary | ICD-10-CM | POA: Insufficient documentation

## 2015-06-05 DIAGNOSIS — M25471 Effusion, right ankle: Secondary | ICD-10-CM | POA: Insufficient documentation

## 2015-06-05 DIAGNOSIS — F329 Major depressive disorder, single episode, unspecified: Secondary | ICD-10-CM | POA: Diagnosis not present

## 2015-06-05 DIAGNOSIS — D649 Anemia, unspecified: Secondary | ICD-10-CM | POA: Insufficient documentation

## 2015-06-05 DIAGNOSIS — Z880 Allergy status to penicillin: Secondary | ICD-10-CM | POA: Diagnosis not present

## 2015-06-05 DIAGNOSIS — R000 Tachycardia, unspecified: Secondary | ICD-10-CM | POA: Insufficient documentation

## 2015-06-05 HISTORY — DX: Depression, unspecified: F32.A

## 2015-06-05 HISTORY — DX: Gastro-esophageal reflux disease without esophagitis: K21.9

## 2015-06-05 HISTORY — DX: Headache, unspecified: R51.9

## 2015-06-05 HISTORY — PX: CATARACT EXTRACTION W/PHACO: SHX586

## 2015-06-05 HISTORY — DX: Headache: R51

## 2015-06-05 HISTORY — DX: Major depressive disorder, single episode, unspecified: F32.9

## 2015-06-05 HISTORY — DX: Cardiac arrhythmia, unspecified: I49.9

## 2015-06-05 SURGERY — PHACOEMULSIFICATION, CATARACT, WITH IOL INSERTION
Anesthesia: Monitor Anesthesia Care | Laterality: Right

## 2015-06-05 MED ORDER — POVIDONE-IODINE 5 % OP SOLN
OPHTHALMIC | Status: AC
  Administered 2015-06-05: 1 via OPHTHALMIC
  Filled 2015-06-05: qty 30

## 2015-06-05 MED ORDER — TETRACAINE HCL 0.5 % OP SOLN
OPHTHALMIC | Status: AC
  Administered 2015-06-05: 1 [drp] via OPHTHALMIC
  Filled 2015-06-05: qty 2

## 2015-06-05 MED ORDER — TETRACAINE HCL 0.5 % OP SOLN
1.0000 [drp] | OPHTHALMIC | Status: AC | PRN
Start: 2015-06-05 — End: 2015-06-05
  Administered 2015-06-05: 1 [drp] via OPHTHALMIC

## 2015-06-05 MED ORDER — CEFUROXIME OPHTHALMIC INJECTION 1 MG/0.1 ML
INJECTION | OPHTHALMIC | Status: AC
  Filled 2015-06-05: qty 0.1

## 2015-06-05 MED ORDER — CEFUROXIME OPHTHALMIC INJECTION 1 MG/0.1 ML
INJECTION | OPHTHALMIC | Status: DC | PRN
  Administered 2015-06-05: 0.1 mL via INTRACAMERAL

## 2015-06-05 MED ORDER — NEOMYCIN-POLYMYXIN-DEXAMETH 3.5-10000-0.1 OP OINT
TOPICAL_OINTMENT | OPHTHALMIC | Status: AC
  Filled 2015-06-05: qty 3.5

## 2015-06-05 MED ORDER — EPINEPHRINE HCL 1 MG/ML IJ SOLN
INTRAOCULAR | Status: DC | PRN
  Administered 2015-06-05: 1 mL via OPHTHALMIC

## 2015-06-05 MED ORDER — NA HYALUR & NA CHOND-NA HYALUR 0.4-0.35 ML IO KIT
PACK | INTRAOCULAR | Status: DC | PRN
  Administered 2015-06-05: 1 mL via INTRAOCULAR

## 2015-06-05 MED ORDER — ARMC OPHTHALMIC DILATING GEL
OPHTHALMIC | Status: AC
  Filled 2015-06-05: qty 0.25

## 2015-06-05 MED ORDER — CARBACHOL 0.01 % IO SOLN
INTRAOCULAR | Status: DC | PRN
  Administered 2015-06-05: 0.5 mL via INTRAOCULAR

## 2015-06-05 MED ORDER — MOXIFLOXACIN HCL 0.5 % OP SOLN
OPHTHALMIC | Status: AC
  Filled 2015-06-05: qty 3

## 2015-06-05 MED ORDER — MOXIFLOXACIN HCL 0.5 % OP SOLN: 1.0000 [drp] | OPHTHALMIC | Status: DC | PRN

## 2015-06-05 MED ORDER — ARMC OPHTHALMIC DILATING GEL: 1.0000 "application " | OPHTHALMIC | Status: DC | PRN

## 2015-06-05 MED ORDER — NA HYALUR & NA CHOND-NA HYALUR 0.55-0.5 ML IO KIT
PACK | INTRAOCULAR | Status: AC
  Filled 2015-06-05: qty 1.05

## 2015-06-05 MED ORDER — EPINEPHRINE HCL 1 MG/ML IJ SOLN
INTRAMUSCULAR | Status: AC
  Filled 2015-06-05: qty 1

## 2015-06-05 MED ORDER — POVIDONE-IODINE 5 % OP SOLN
1.0000 "application " | OPHTHALMIC | Status: AC | PRN
  Administered 2015-06-05: 1 via OPHTHALMIC

## 2015-06-05 MED ORDER — NEOMYCIN-POLYMYXIN-DEXAMETH 0.1 % OP OINT
TOPICAL_OINTMENT | OPHTHALMIC | Status: DC | PRN
  Administered 2015-06-05: 1 via OPHTHALMIC

## 2015-06-05 MED ORDER — SODIUM CHLORIDE 0.9 % IV SOLN
INTRAVENOUS | Status: DC
  Administered 2015-06-05 (×2): via INTRAVENOUS

## 2015-06-05 MED ORDER — FENTANYL CITRATE (PF) 100 MCG/2ML IJ SOLN
INTRAMUSCULAR | Status: DC | PRN
  Administered 2015-06-05 (×2): 25 ug via INTRAVENOUS

## 2015-06-05 SURGICAL SUPPLY — 22 items
CANNULA ANT/CHMB 27GA (MISCELLANEOUS) ×2 IMPLANT
CUP MEDICINE 2OZ PLAST GRAD ST (MISCELLANEOUS) ×2 IMPLANT
GLOVE BIO SURGEON STRL SZ8 (GLOVE) ×2 IMPLANT
GLOVE BIOGEL M 6.5 STRL (GLOVE) ×2 IMPLANT
GLOVE SURG LX 7.5 STRW (GLOVE) ×1
GLOVE SURG LX STRL 7.5 STRW (GLOVE) ×1 IMPLANT
GOWN STRL REUS W/ TWL LRG LVL3 (GOWN DISPOSABLE) ×2 IMPLANT
GOWN STRL REUS W/TWL LRG LVL3 (GOWN DISPOSABLE) ×2
LENS IOL TECNIS TRC I 225 15.0 (Intraocular Lens) ×1 IMPLANT
LENS IOL TORIC 15.0 (Intraocular Lens) ×2 IMPLANT
PACK CATARACT (MISCELLANEOUS) ×2 IMPLANT
PACK CATARACT BRASINGTON LX (MISCELLANEOUS) ×2 IMPLANT
PACK EYE AFTER SURG (MISCELLANEOUS) ×2 IMPLANT
SOL BSS BAG (MISCELLANEOUS) ×2
SOL PREP PVP 2OZ (MISCELLANEOUS) ×2
SOLUTION BSS BAG (MISCELLANEOUS) ×1 IMPLANT
SOLUTION PREP PVP 2OZ (MISCELLANEOUS) ×1 IMPLANT
SYR 3ML LL SCALE MARK (SYRINGE) ×2 IMPLANT
SYR 5ML LL (SYRINGE) ×2 IMPLANT
SYR TB 1ML 27GX1/2 LL (SYRINGE) ×2 IMPLANT
WATER STERILE IRR 1000ML POUR (IV SOLUTION) ×2 IMPLANT
WIPE NON LINTING 3.25X3.25 (MISCELLANEOUS) ×2 IMPLANT

## 2015-06-05 NOTE — H&P (Signed)
  The History and Physical notes are on paper, have been signed, and are to be scanned. The patient remains stable and unchanged from the H&P.   Previous H&P reviewed, patient examined, and there are no changes.  Ashley Young 06/05/2015 7:32 AM  

## 2015-06-05 NOTE — Anesthesia Procedure Notes (Signed)
Procedure Name: MAC Date/Time: 06/05/2015 8:00 AM Performed by: Henrietta HooverPOPE, Kingslee Mairena Pre-anesthesia Checklist: Patient identified, Emergency Drugs available, Suction available, Patient being monitored and Timeout performed Patient Re-evaluated:Patient Re-evaluated prior to inductionOxygen Delivery Method: Nasal cannula Intubation Type: IV induction

## 2015-06-05 NOTE — Transfer of Care (Signed)
Immediate Anesthesia Transfer of Care Note  Patient: Ashley Young  Procedure(s) Performed: Procedure(s) with comments: CATARACT EXTRACTION PHACO AND INTRAOCULAR LENS PLACEMENT (IOC) (Right) - US 01:06 AP% 22.0 CDE 14.58 fluid paclk lot # [id number]   fluid pack lot # [id number]  Patient Location: PACU  Anesthesia Type:MAC  Level of Consciousness: awake  Airway & Oxygen Therapy: Patient Spontanous Breathing  Post-op Assessment: Report given to RN  Post vital signs: Reviewed and stable  Last Vitals:  Filed Vitals:   06/05/15 0830  BP: 130/66  Pulse: 93  Temp: 36.7 C  Resp: 16    Complications: No apparent anesthesia complications

## 2015-06-05 NOTE — Anesthesia Postprocedure Evaluation (Signed)
  Anesthesia Post-op Note  Patient: Ashley Young  Procedure(s) Performed: Procedure(s) with comments: CATARACT EXTRACTION PHACO AND INTRAOCULAR LENS PLACEMENT (IOC) (Right) - US 01:06 AP% 22.0 CDE 14.58 fluid paclk lot # [id number]   fluid pack lot # [id number]  Anesthesia type:MAC  Patient location: PACU  Post pain: Pain level controlled  Post assessment: Post-op Vital signs reviewed, Patient's Cardiovascular Status Stable, Respiratory Function Stable, Patent Airway and No signs of Nausea or vomiting  Post vital signs: Reviewed and stable  Last Vitals:  Filed Vitals:   06/05/15 0900  BP: 129/73  Pulse: 83  Temp: 36.7 C  Resp:     Level of consciousness: awake, alert  and patient cooperative  Complications: No apparent anesthesia complications

## 2015-06-05 NOTE — Op Note (Signed)
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 Item Serial No. Manufacturer Lot No. LRB No. Used  LENS IOL TORIC - [id number] Intraocular Lens LENS IOL TORIC [id number]   Right 1   ZCT 225 20.5 D PCIOL inserted at axis 179 deg    ULTRASOUND TIME: 22 of 1 minutes 6 seconds, CDE 14.6  SURGEON:  Deirdre Evenerhadwick R. Maebell Lyvers, MD   ANESTHESIA:  Topical with tetracaine drops and 2% Xylocaine jelly.   COMPLICATIONS:  None.   DESCRIPTION OF PROCEDURE:  The patient was identified in the holding room and transported to the operating room and placed in the supine position under the operating microscope. Theright eye was identified as the operative eye and it was prepped and draped in the usual sterile ophthalmic fashion.   A 1 millimeter clear-corneal paracentesis was made at the 12:00 position.  The anterior chamber was filled with Viscoat viscoelastic.  A 2.4 millimeter keratome was used to make a near-clear corneal incision at the 9:00 position. A cystotome and capsulorrhexis forceps were then used to make a curvilinear capsulorrhexis.  Hydrodissection and hydrodelineation were then performed using balanced salt solution.   Phacoemulsification was then used in stop and chop fashion to remove the lens, nucleus and epinucleus.  The remaining cortex was aspirated using the irrigation and aspiration handpiece.  Provisc viscoelastic was then placed into the capsular bag to distend it for lens placement.     A Toric lens was then injected into the capsular bag.  The Verion digital marker was used to align the implant at the intended axis.  It  was rotated clockwise until the axis marks on the lens were approximately 15 degrees in the counterclockwise direction to the intended alignment.  The viscoelastic was aspirated from the eye using the irrigation aspiration handpiece.  Then, a Koch spatula through the sideport incision was used to rotate the lens in a clockwise direction until the axis markings of the intraocular lens were lined up with the Verion alignment.  Wounds were hydrated with balanced salt solution.  The anterior chamber was inflated to a physiologic pressure with balanced salt solution. Cefuroxime 0.1 ml of a 10mg /ml solution was injected into the anterior chamber for a dose of 1 mg of intracameral antibiotic at the completion of the case. Miostat was placed into the anterior chamber to constrict the pupil.  No wound leaks were noted.  Topical Vigamox drops and Maxitrol ointment were applied to the eye.  The patient was taken to the recovery room in stable condition without complications of anesthesia or surgery.  Zalen Sequeira 06/05/2015, 8:29 AM

## 2015-06-05 NOTE — Anesthesia Preprocedure Evaluation (Addendum)
Anesthesia Evaluation  Patient identified by MRN, date of birth, ID band Patient awake    Reviewed: Allergy & Precautions, NPO status , Patient's Chart, lab work & pertinent test results, reviewed documented beta blocker date and time   Airway Mallampati: II  TM Distance: >3 FB     Dental  (+) Lower Dentures, Upper Dentures   Pulmonary former smoker,           Cardiovascular + dysrhythmias      Neuro/Psych  Headaches, PSYCHIATRIC DISORDERS Anxiety Depression    GI/Hepatic GERD  ,  Endo/Other    Renal/GU Renal InsufficiencyRenal disease     Musculoskeletal  (+) Arthritis ,   Abdominal   Peds  Hematology  (+) anemia ,   Anesthesia Other Findings   Reproductive/Obstetrics                            Anesthesia Physical Anesthesia Plan  ASA: III  Anesthesia Plan: MAC   Post-op Pain Management:    Induction:   Airway Management Planned: Nasal Cannula  Additional Equipment:   Intra-op Plan:   Post-operative Plan:   Informed Consent: I have reviewed the patients History and Physical, chart, labs and discussed the procedure including the risks, benefits and alternatives for the proposed anesthesia with the patient or authorized representative who has indicated his/her understanding and acceptance.     Plan Discussed with: CRNA  Anesthesia Plan Comments:         Anesthesia Quick Evaluation

## 2015-07-07 ENCOUNTER — Encounter: Payer: Self-pay | Admitting: *Deleted

## 2015-07-07 ENCOUNTER — Other Ambulatory Visit: Payer: Self-pay | Admitting: *Deleted

## 2015-07-07 DIAGNOSIS — D509 Iron deficiency anemia, unspecified: Secondary | ICD-10-CM

## 2015-07-07 DIAGNOSIS — E538 Deficiency of other specified B group vitamins: Secondary | ICD-10-CM

## 2015-07-10 ENCOUNTER — Inpatient Hospital Stay: Payer: Medicare Other

## 2015-07-10 ENCOUNTER — Inpatient Hospital Stay: Payer: Medicare Other | Attending: Internal Medicine | Admitting: Internal Medicine

## 2015-07-10 VITALS — Ht 62.0 in | Wt 87.3 lb

## 2015-07-10 DIAGNOSIS — K219 Gastro-esophageal reflux disease without esophagitis: Secondary | ICD-10-CM | POA: Diagnosis not present

## 2015-07-10 DIAGNOSIS — Z8669 Personal history of other diseases of the nervous system and sense organs: Secondary | ICD-10-CM

## 2015-07-10 DIAGNOSIS — Z8679 Personal history of other diseases of the circulatory system: Secondary | ICD-10-CM | POA: Diagnosis not present

## 2015-07-10 DIAGNOSIS — E538 Deficiency of other specified B group vitamins: Secondary | ICD-10-CM

## 2015-07-10 DIAGNOSIS — Z8711 Personal history of peptic ulcer disease: Secondary | ICD-10-CM | POA: Insufficient documentation

## 2015-07-10 DIAGNOSIS — F17210 Nicotine dependence, cigarettes, uncomplicated: Secondary | ICD-10-CM

## 2015-07-10 DIAGNOSIS — D509 Iron deficiency anemia, unspecified: Secondary | ICD-10-CM | POA: Diagnosis not present

## 2015-07-10 DIAGNOSIS — E7800 Pure hypercholesterolemia, unspecified: Secondary | ICD-10-CM

## 2015-07-10 DIAGNOSIS — Z79899 Other long term (current) drug therapy: Secondary | ICD-10-CM | POA: Insufficient documentation

## 2015-07-10 DIAGNOSIS — Z87442 Personal history of urinary calculi: Secondary | ICD-10-CM | POA: Diagnosis not present

## 2015-07-10 DIAGNOSIS — M129 Arthropathy, unspecified: Secondary | ICD-10-CM | POA: Insufficient documentation

## 2015-07-10 DIAGNOSIS — F329 Major depressive disorder, single episode, unspecified: Secondary | ICD-10-CM

## 2015-07-10 DIAGNOSIS — R609 Edema, unspecified: Secondary | ICD-10-CM | POA: Insufficient documentation

## 2015-07-10 LAB — CBC WITH DIFFERENTIAL/PLATELET
BASOS PCT: 1 %
Basophils Absolute: 0.1 10*3/uL (ref 0–0.1)
Eosinophils Absolute: 0.1 10*3/uL (ref 0–0.7)
Eosinophils Relative: 2 %
HEMATOCRIT: 45.9 % (ref 35.0–47.0)
Hemoglobin: 14.7 g/dL (ref 12.0–16.0)
Lymphocytes Relative: 17 %
Lymphs Abs: 1.4 10*3/uL (ref 1.0–3.6)
MCH: 28.4 pg (ref 26.0–34.0)
MCHC: 32.1 g/dL (ref 32.0–36.0)
MCV: 88.5 fL (ref 80.0–100.0)
MONO ABS: 0.5 10*3/uL (ref 0.2–0.9)
MONOS PCT: 6 %
NEUTROS ABS: 6.3 10*3/uL (ref 1.4–6.5)
Neutrophils Relative %: 74 %
PLATELETS: 300 10*3/uL (ref 150–440)
RBC: 5.18 MIL/uL (ref 3.80–5.20)
RDW: 21.2 % — ABNORMAL HIGH (ref 11.5–14.5)
WBC: 8.5 10*3/uL (ref 3.6–11.0)

## 2015-07-10 LAB — FERRITIN: FERRITIN: 65 ng/mL (ref 11–307)

## 2015-07-10 LAB — IRON AND TIBC
Iron: 44 ug/dL (ref 28–170)
SATURATION RATIOS: 12 % (ref 10.4–31.8)
TIBC: 381 ug/dL (ref 250–450)
UIBC: 337 ug/dL

## 2015-07-10 MED ORDER — CYANOCOBALAMIN 1000 MCG/ML IJ SOLN
1000.0000 ug | Freq: Once | INTRAMUSCULAR | Status: AC
  Administered 2015-07-10: 1000 ug via INTRAMUSCULAR
  Filled 2015-07-10: qty 1

## 2015-07-10 NOTE — Progress Notes (Signed)
[facility] OFFICE PROGRESS NOTE  Patient Care Team: Marisue Ivan, MD as PCP - General (Family Medicine)   SUMMARY OF ONCOLOGIC HISTORY:  # ANEMIA- Multifactorial- Iron def/ B12 def [previous stomach surgeries/PUD]- IV iron last October 2016/B12 injections monthly   INTERVAL HISTORY:  This is my first interaction with the patient since I joined the practice September 2016. I reviewed the patient's prior charts/pertinent labs/imaging in detail; findings are summarized above.   A pleasant 74 year old female patient with above history of anemia likely secondary to malabsorption- currently on IV iron/B12 shot is here for follow-up. Patient last received IV iron October 2016.  Patient energy levels have improved. She denies any blood in stools black stools.   REVIEW OF SYSTEMS:  A complete 10 point review of system is done which is negative except mentioned above/history of present illness.   PAST MEDICAL HISTORY :  Past Medical History  Diagnosis Date  . Arthritis   . Blood transfusion without reported diagnosis   . Dysrhythmia     TACHYCARDIA  . Headache     MIGRAINES  . GERD (gastroesophageal reflux disease)   . Anemia   . Renal disorder     Multiple episodes of kidney stones.   . Depression   . Edema     MILD OF ANKLES  . IDA (iron deficiency anemia)   . Microcytic anemia   . Multiple gastric ulcers   . B12 deficiency   . Migraine   . Hypercholesterolemia     PAST SURGICAL HISTORY :   Past Surgical History  Procedure Laterality Date  . Abdominal surgery      Reconstructed stomach and 3 different surgeries.   Marland Kitchen Appendectomy    . Breast surgery      cyst removal  . Cholecystectomy    . Fracture surgery      HIP  . Cataract extraction w/phaco Right 06/05/2015    Procedure: CATARACT EXTRACTION PHACO AND INTRAOCULAR LENS PLACEMENT (IOC);  Surgeon: Lockie Mola, MD;  Location: [facility] ORS;  Service: Ophthalmology;  Laterality: Right;  [place] 01:06    . Kyphoplasty    . Cesarean section      x 3  . Colonoscopy    . Esophagogastroduodenoscopy      FAMILY HISTORY :  No family history on file.  SOCIAL HISTORY:   Social History  Substance Use Topics  . Smoking status: Former Smoker    Types: Cigarettes    Quit date: 11/29/1993  . Smokeless tobacco: Never Used  . Alcohol Use: 0.6 oz/week    1 Standard drinks or equivalent per week    ALLERGIES:  is allergic to penicillins; aspirin; ferrous gluconate; iron polysaccharide; nsaids; other; sulfamethoxazole-trimethoprim; and venofer.  MEDICATIONS:  Current Outpatient Prescriptions  Medication Sig Dispense Refill  . amitriptyline (ELAVIL) 50 MG tablet     . FLUoxetine (PROZAC) 20 MG capsule 30 mg.     . furosemide (LASIX) 20 MG tablet Take 20 mg by mouth daily.    Marland Kitchen latanoprost (XALATAN) 0.005 % ophthalmic solution     . ondansetron (ZOFRAN-ODT) 4 MG disintegrating tablet 4 mg every 8 (eight) hours as needed.     . pantoprazole (PROTONIX) 40 MG tablet 40 mg 2 (two) times daily.     . SUMAtriptan (IMITREX) 100 MG tablet 100 mg every 2 (two) hours as needed.     . tamsulosin (FLOMAX) 0.4 MG CAPS capsule 0.4 mg daily.      No current facility-administered medications for  this visit.    PHYSICAL EXAMINATION:  Ht  (1.575 m)  Wt 87 lb 4.8 oz (39.6 kg)  BMI 15.96 kg/m2  Filed Weights   07/10/15 1419  Weight: 87 lb 4.8 oz (39.6 kg)    GENERAL: Thin built moderately nourished female patient Alert, no distress and comfortable. Accompanied by her husband. EYES: no pallor or icterus OROPHARYNX: no thrush or ulceration; good dentition  NECK: supple, no masses felt LYMPH:  no palpable lymphadenopathy in the cervical, axillary or inguinal regions LUNGS: clear to auscultation and  No wheeze or crackles HEART/CVS: regular rate & rhythm and no murmurs; No lower extremity edema ABDOMEN:abdomen soft, non-tender and normal bowel sounds Musculoskeletal:no cyanosis of digits and no  clubbing  PSYCH: alert & oriented x 3 with fluent speech NEURO: no focal motor/sensory deficits SKIN:  no rashes or significant lesions  LABORATORY DATA:  I have reviewed the data as listed    Component Value Date/Time   NA 137 11/30/2014 2154   NA 139 11/10/2014 0503   K 3.1* 11/30/2014 2154   K 3.6 11/10/2014 0503   CL 98* 11/30/2014 2154   CL 107 11/10/2014 0503   CO2 25 11/30/2014 2154   CO2 24 11/10/2014 0503   GLUCOSE 124* 11/30/2014 2154   GLUCOSE 84 11/10/2014 0503   BUN 17 11/30/2014 2154   BUN 7 11/10/2014 0503   CREATININE 0.59 04/24/2015 1130   CREATININE 0.56 11/10/2014 0503   CALCIUM 8.8* 11/30/2014 2154   CALCIUM 7.8* 11/10/2014 0503   PROT 6.9 11/09/2014 1254   ALBUMIN 3.5 11/09/2014 1254   AST 19 11/09/2014 1254   ALT 15 11/09/2014 1254   ALKPHOS 155* 11/09/2014 1254   BILITOT 0.3 11/09/2014 1254   GFRNONAA >60 04/24/2015 1130   GFRNONAA >60 11/10/2014 0503   GFRAA >60 04/24/2015 1130   GFRAA >60 11/10/2014 0503    No results found for: SPEP, UPEP  Lab Results  Component Value Date   WBC 8.5 07/10/2015   NEUTROABS 6.3 07/10/2015   HGB 14.7 07/10/2015   HCT 45.9 07/10/2015   MCV 88.5 07/10/2015   PLT 300 07/10/2015      Chemistry      Component Value Date/Time   NA 137 11/30/2014 2154   NA 139 11/10/2014 0503   K 3.1* 11/30/2014 2154   K 3.6 11/10/2014 0503   CL 98* 11/30/2014 2154   CL 107 11/10/2014 0503   CO2 25 11/30/2014 2154   CO2 24 11/10/2014 0503   BUN 17 11/30/2014 2154   BUN 7 11/10/2014 0503   CREATININE 0.59 04/24/2015 1130   CREATININE 0.56 11/10/2014 0503      Component Value Date/Time   CALCIUM 8.8* 11/30/2014 2154   CALCIUM 7.8* 11/10/2014 0503   ALKPHOS 155* 11/09/2014 1254   AST 19 11/09/2014 1254   ALT 15 11/09/2014 1254   BILITOT 0.3 11/09/2014 1254       RADIOGRAPHIC STUDIES: I have personally reviewed the radiological images as listed and agreed with the findings in the report. No results found.    ASSESSMENT & PLAN:   # Anemia- multifactorial/iron deficiency-B12 deficiency secondary to previous GI surgeries. Patient received IV iron in October 2016- had a great response. Today hemoglobin is 14 [previously 9-10].   # Patient will not need IV iron today; we'll reevaluate in 2 months for possible IV iron.  # B12 deficiency- continue B12 shots every 2 months. Encouraged the patient to take sublingual B12. Will check B12 levels  again in 4 months   # CBC/ferritin/possible IV iron/B12 in 2 months; CBC ferritin possible IV iron/B12 shot; B12 levels-appointment with me in 4 months.        Earna CoderGovinda R Brahmanday, MD 07/10/2015 2:41 PM

## 2015-07-26 ENCOUNTER — Other Ambulatory Visit: Payer: Self-pay | Admitting: Nurse Practitioner

## 2015-09-11 ENCOUNTER — Inpatient Hospital Stay: Payer: Medicare Other

## 2015-09-11 ENCOUNTER — Inpatient Hospital Stay: Payer: Medicare Other | Attending: Internal Medicine

## 2015-09-11 DIAGNOSIS — Z79899 Other long term (current) drug therapy: Secondary | ICD-10-CM | POA: Insufficient documentation

## 2015-09-11 DIAGNOSIS — E538 Deficiency of other specified B group vitamins: Secondary | ICD-10-CM

## 2015-09-11 DIAGNOSIS — D509 Iron deficiency anemia, unspecified: Secondary | ICD-10-CM

## 2015-09-11 LAB — CBC WITH DIFFERENTIAL/PLATELET
BASOS ABS: 0.1 10*3/uL (ref 0–0.1)
Basophils Relative: 2 %
Eosinophils Absolute: 0.2 10*3/uL (ref 0–0.7)
Eosinophils Relative: 3 %
HEMATOCRIT: 39.8 % (ref 35.0–47.0)
HEMOGLOBIN: 13.4 g/dL (ref 12.0–16.0)
LYMPHS PCT: 22 %
Lymphs Abs: 1.7 10*3/uL (ref 1.0–3.6)
MCH: 29.6 pg (ref 26.0–34.0)
MCHC: 33.6 g/dL (ref 32.0–36.0)
MCV: 88.1 fL (ref 80.0–100.0)
MONO ABS: 0.6 10*3/uL (ref 0.2–0.9)
Monocytes Relative: 7 %
NEUTROS ABS: 5.2 10*3/uL (ref 1.4–6.5)
Neutrophils Relative %: 66 %
Platelets: 263 10*3/uL (ref 150–440)
RBC: 4.52 MIL/uL (ref 3.80–5.20)
RDW: 13.4 % (ref 11.5–14.5)
WBC: 7.8 10*3/uL (ref 3.6–11.0)

## 2015-09-11 LAB — FERRITIN: Ferritin: 20 ng/mL (ref 11–307)

## 2015-09-11 MED ORDER — CYANOCOBALAMIN 1000 MCG/ML IJ SOLN
1000.0000 ug | Freq: Once | INTRAMUSCULAR | Status: AC
  Administered 2015-09-11: 1000 ug via INTRAMUSCULAR
  Filled 2015-09-11: qty 1

## 2015-09-11 NOTE — Progress Notes (Signed)
Ferritin result not back yet. Patient opted to reschedule venofer if needed.

## 2015-09-12 ENCOUNTER — Other Ambulatory Visit: Payer: Self-pay | Admitting: Internal Medicine

## 2015-09-12 ENCOUNTER — Telehealth: Payer: Self-pay | Admitting: *Deleted

## 2015-09-12 NOTE — Telephone Encounter (Signed)
-----   Message from Earna Coder, MD sent at 09/12/2015 12:00 PM EST ----- Pt needs IV iron this week; also set her up for IV iron at next visit/have labs done prior to my visit in 2 months from now. Please inform pt.

## 2015-09-12 NOTE — Telephone Encounter (Signed)
Msg sent to scheduling to arrange IV iron and labs a few days prior at next visit in April. Pt is already on the schedule in April for IV iron.

## 2015-09-14 ENCOUNTER — Encounter: Admission: RE | Payer: Self-pay | Source: Ambulatory Visit

## 2015-09-14 ENCOUNTER — Ambulatory Visit: Admission: RE | Admit: 2015-09-14 | Payer: Medicare Other | Source: Ambulatory Visit | Admitting: Ophthalmology

## 2015-09-14 SURGERY — PHACOEMULSIFICATION, CATARACT, WITH IOL INSERTION
Anesthesia: Choice | Laterality: Left

## 2015-09-15 ENCOUNTER — Inpatient Hospital Stay: Payer: Medicare Other

## 2015-09-15 ENCOUNTER — Other Ambulatory Visit: Payer: Self-pay | Admitting: Internal Medicine

## 2015-09-15 VITALS — BP 123/74 | HR 76 | Temp 97.8°F | Resp 18

## 2015-09-15 DIAGNOSIS — D509 Iron deficiency anemia, unspecified: Secondary | ICD-10-CM

## 2015-09-15 DIAGNOSIS — E538 Deficiency of other specified B group vitamins: Secondary | ICD-10-CM | POA: Diagnosis not present

## 2015-09-15 MED ORDER — SODIUM CHLORIDE 0.9 % IV SOLN
510.0000 mg | Freq: Once | INTRAVENOUS | Status: AC
  Administered 2015-09-15: 510 mg via INTRAVENOUS
  Filled 2015-09-15: qty 17

## 2015-09-15 MED ORDER — SODIUM CHLORIDE 0.9 % IV SOLN
INTRAVENOUS | Status: DC
  Administered 2015-09-15: 14:00:00 via INTRAVENOUS
  Filled 2015-09-15: qty 1000

## 2015-11-10 ENCOUNTER — Inpatient Hospital Stay: Payer: Medicare Other | Attending: Internal Medicine

## 2015-11-10 ENCOUNTER — Other Ambulatory Visit: Payer: Self-pay | Admitting: *Deleted

## 2015-11-10 DIAGNOSIS — E538 Deficiency of other specified B group vitamins: Secondary | ICD-10-CM

## 2015-11-10 DIAGNOSIS — D509 Iron deficiency anemia, unspecified: Secondary | ICD-10-CM | POA: Diagnosis present

## 2015-11-10 DIAGNOSIS — R609 Edema, unspecified: Secondary | ICD-10-CM | POA: Diagnosis not present

## 2015-11-10 DIAGNOSIS — Z79899 Other long term (current) drug therapy: Secondary | ICD-10-CM | POA: Diagnosis not present

## 2015-11-10 DIAGNOSIS — K219 Gastro-esophageal reflux disease without esophagitis: Secondary | ICD-10-CM | POA: Diagnosis not present

## 2015-11-10 DIAGNOSIS — E7800 Pure hypercholesterolemia, unspecified: Secondary | ICD-10-CM | POA: Insufficient documentation

## 2015-11-10 DIAGNOSIS — Z87442 Personal history of urinary calculi: Secondary | ICD-10-CM | POA: Insufficient documentation

## 2015-11-10 DIAGNOSIS — M129 Arthropathy, unspecified: Secondary | ICD-10-CM | POA: Diagnosis not present

## 2015-11-10 DIAGNOSIS — Z8711 Personal history of peptic ulcer disease: Secondary | ICD-10-CM | POA: Insufficient documentation

## 2015-11-10 DIAGNOSIS — R000 Tachycardia, unspecified: Secondary | ICD-10-CM | POA: Diagnosis not present

## 2015-11-10 DIAGNOSIS — F329 Major depressive disorder, single episode, unspecified: Secondary | ICD-10-CM | POA: Insufficient documentation

## 2015-11-10 DIAGNOSIS — Z8669 Personal history of other diseases of the nervous system and sense organs: Secondary | ICD-10-CM | POA: Insufficient documentation

## 2015-11-10 LAB — CBC WITH DIFFERENTIAL/PLATELET
BASOS ABS: 0.1 10*3/uL (ref 0–0.1)
Basophils Relative: 1 %
Eosinophils Absolute: 0.4 10*3/uL (ref 0–0.7)
Eosinophils Relative: 6 %
HEMATOCRIT: 41.9 % (ref 35.0–47.0)
HEMOGLOBIN: 14.1 g/dL (ref 12.0–16.0)
LYMPHS PCT: 20 %
Lymphs Abs: 1.3 10*3/uL (ref 1.0–3.6)
MCH: 29.8 pg (ref 26.0–34.0)
MCHC: 33.7 g/dL (ref 32.0–36.0)
MCV: 88.5 fL (ref 80.0–100.0)
Monocytes Absolute: 0.5 10*3/uL (ref 0.2–0.9)
Monocytes Relative: 8 %
NEUTROS ABS: 4.1 10*3/uL (ref 1.4–6.5)
NEUTROS PCT: 65 %
PLATELETS: 215 10*3/uL (ref 150–440)
RBC: 4.73 MIL/uL (ref 3.80–5.20)
RDW: 15 % — ABNORMAL HIGH (ref 11.5–14.5)
WBC: 6.3 10*3/uL (ref 3.6–11.0)

## 2015-11-10 LAB — FERRITIN: Ferritin: 79 ng/mL (ref 11–307)

## 2015-11-10 LAB — VITAMIN B12: Vitamin B-12: 260 pg/mL (ref 180–914)

## 2015-11-13 ENCOUNTER — Other Ambulatory Visit: Payer: Medicare Other

## 2015-11-13 ENCOUNTER — Inpatient Hospital Stay: Payer: Medicare Other

## 2015-11-13 ENCOUNTER — Inpatient Hospital Stay (HOSPITAL_BASED_OUTPATIENT_CLINIC_OR_DEPARTMENT_OTHER): Payer: Medicare Other | Admitting: Internal Medicine

## 2015-11-13 VITALS — BP 128/85 | HR 75 | Temp 97.4°F | Resp 18 | Wt 99.0 lb

## 2015-11-13 DIAGNOSIS — M129 Arthropathy, unspecified: Secondary | ICD-10-CM | POA: Diagnosis not present

## 2015-11-13 DIAGNOSIS — Z8669 Personal history of other diseases of the nervous system and sense organs: Secondary | ICD-10-CM

## 2015-11-13 DIAGNOSIS — Z87442 Personal history of urinary calculi: Secondary | ICD-10-CM

## 2015-11-13 DIAGNOSIS — E538 Deficiency of other specified B group vitamins: Secondary | ICD-10-CM

## 2015-11-13 DIAGNOSIS — R609 Edema, unspecified: Secondary | ICD-10-CM | POA: Diagnosis not present

## 2015-11-13 DIAGNOSIS — D509 Iron deficiency anemia, unspecified: Secondary | ICD-10-CM | POA: Diagnosis not present

## 2015-11-13 DIAGNOSIS — Z79899 Other long term (current) drug therapy: Secondary | ICD-10-CM

## 2015-11-13 DIAGNOSIS — R000 Tachycardia, unspecified: Secondary | ICD-10-CM

## 2015-11-13 DIAGNOSIS — E7800 Pure hypercholesterolemia, unspecified: Secondary | ICD-10-CM

## 2015-11-13 DIAGNOSIS — Z8711 Personal history of peptic ulcer disease: Secondary | ICD-10-CM

## 2015-11-13 DIAGNOSIS — F329 Major depressive disorder, single episode, unspecified: Secondary | ICD-10-CM

## 2015-11-13 DIAGNOSIS — K219 Gastro-esophageal reflux disease without esophagitis: Secondary | ICD-10-CM

## 2015-11-13 MED ORDER — CYANOCOBALAMIN 1000 MCG/ML IJ SOLN
1000.0000 ug | Freq: Once | INTRAMUSCULAR | Status: AC
  Administered 2015-11-13: 1000 ug via INTRAMUSCULAR
  Filled 2015-11-13: qty 1

## 2015-11-13 NOTE — Progress Notes (Signed)
[facility] OFFICE PROGRESS NOTE  Patient Care Team: Marisue Ivan, MD as PCP - General (Family Medicine)   SUMMARY OF ONCOLOGIC HISTORY:  # ANEMIA- Multifactorial- Iron def/ B12 def [previous stomach surgeries/PUD]- IV iron last October 2016/B12 injections monthly   INTERVAL HISTORY:  A pleasant 75 year old female patient with above history of anemia likely secondary to malabsorption- currently on IV iron/B12 shot is here for follow-up.  Patient last received IV iron in February 2017.   Patient has intermittent swelling in the legs intermittently. Improving.  Patient energy levels have improved post  IV iron in feb 2017.  She denies any blood in stools black stools.   REVIEW OF SYSTEMS:  A complete 10 point review of system is done which is negative except mentioned above/history of present illness.   PAST MEDICAL HISTORY :  Past Medical History  Diagnosis Date  . Arthritis   . Blood transfusion without reported diagnosis   . Dysrhythmia     TACHYCARDIA  . Headache     MIGRAINES  . GERD (gastroesophageal reflux disease)   . Anemia   . Renal disorder     Multiple episodes of kidney stones.   . Depression   . Edema     MILD OF ANKLES  . IDA (iron deficiency anemia)   . Microcytic anemia   . Multiple gastric ulcers   . B12 deficiency   . Migraine   . Hypercholesterolemia     PAST SURGICAL HISTORY :   Past Surgical History  Procedure Laterality Date  . Abdominal surgery      Reconstructed stomach and 3 different surgeries.   Marland Kitchen Appendectomy    . Breast surgery      cyst removal  . Cholecystectomy    . Fracture surgery      HIP  . Cataract extraction w/phaco Right 06/05/2015    Procedure: CATARACT EXTRACTION PHACO AND INTRAOCULAR LENS PLACEMENT (IOC);  Surgeon: Lockie Mola, MD;  Location: [facility] ORS;  Service: Ophthalmology;  Laterality: Right;  [place] 01:06   . Kyphoplasty    . Cesarean section      x 3  . Colonoscopy    .  Esophagogastroduodenoscopy      FAMILY HISTORY :  No family history on file.  SOCIAL HISTORY:   Social History  Substance Use Topics  . Smoking status: Former Smoker    Types: Cigarettes    Quit date: 11/29/1993  . Smokeless tobacco: Never Used  . Alcohol Use: 0.6 oz/week    1 Standard drinks or equivalent per week    ALLERGIES:  is allergic to penicillins; aspirin; ferrous gluconate; iron polysaccharide; nsaids; other; sulfamethoxazole-trimethoprim; and venofer.  MEDICATIONS:  Current Outpatient Prescriptions  Medication Sig Dispense Refill  . amitriptyline (ELAVIL) 50 MG tablet     . FLUoxetine (PROZAC) 20 MG capsule 30 mg.     . furosemide (LASIX) 20 MG tablet Take 20 mg by mouth daily.    Marland Kitchen latanoprost (XALATAN) 0.005 % ophthalmic solution     . metoprolol tartrate (LOPRESSOR) 25 MG tablet Take by mouth.    . ondansetron (ZOFRAN-ODT) 4 MG disintegrating tablet 4 mg every 8 (eight) hours as needed.     . pantoprazole (PROTONIX) 40 MG tablet 40 mg 2 (two) times daily.     . SUMAtriptan (IMITREX) 100 MG tablet 100 mg every 2 (two) hours as needed.     . tamsulosin (FLOMAX) 0.4 MG CAPS capsule 0.4 mg daily.     . Vitamin  D, Ergocalciferol, (DRISDOL) 50000 units CAPS capsule Take by mouth.     No current facility-administered medications for this visit.    PHYSICAL EXAMINATION:  BP 128/85 mmHg  Pulse 75  Temp(Src) 97.4 F (36.3 C) (Tympanic)  Resp 18  Wt 98 lb 15.8 oz (44.9 kg)  Filed Weights   11/13/15 1347  Weight: 98 lb 15.8 oz (44.9 kg)    GENERAL: Thin built moderately nourished female patient Alert, no distress and comfortable. Accompanied by her husband. EYES: no pallor or icterus OROPHARYNX: no thrush or ulceration; good dentition  NECK: supple, no masses felt LYMPH:  no palpable lymphadenopathy in the cervical, axillary or inguinal regions LUNGS: clear to auscultation and  No wheeze or crackles HEART/CVS: regular rate & rhythm and no murmurs; No lower  extremity edema ABDOMEN:abdomen soft, non-tender and normal bowel sounds Musculoskeletal:no cyanosis of digits and no clubbing  PSYCH: alert & oriented x 3 with fluent speech NEURO: no focal motor/sensory deficits SKIN:  no rashes or significant lesions  LABORATORY DATA:  I have reviewed the data as listed    Component Value Date/Time   NA 137 11/30/2014 2154   NA 139 11/10/2014 0503   K 3.1* 11/30/2014 2154   K 3.6 11/10/2014 0503   CL 98* 11/30/2014 2154   CL 107 11/10/2014 0503   CO2 25 11/30/2014 2154   CO2 24 11/10/2014 0503   GLUCOSE 124* 11/30/2014 2154   GLUCOSE 84 11/10/2014 0503   BUN 17 11/30/2014 2154   BUN 7 11/10/2014 0503   CREATININE 0.59 04/24/2015 1130   CREATININE 0.56 11/10/2014 0503   CALCIUM 8.8* 11/30/2014 2154   CALCIUM 7.8* 11/10/2014 0503   PROT 6.9 11/09/2014 1254   ALBUMIN 3.5 11/09/2014 1254   AST 19 11/09/2014 1254   ALT 15 11/09/2014 1254   ALKPHOS 155* 11/09/2014 1254   BILITOT 0.3 11/09/2014 1254   GFRNONAA >60 04/24/2015 1130   GFRNONAA >60 11/10/2014 0503   GFRAA >60 04/24/2015 1130   GFRAA >60 11/10/2014 0503    No results found for: SPEP, UPEP  Lab Results  Component Value Date   WBC 6.3 11/10/2015   NEUTROABS 4.1 11/10/2015   HGB 14.1 11/10/2015   HCT 41.9 11/10/2015   MCV 88.5 11/10/2015   PLT 215 11/10/2015      Chemistry      Component Value Date/Time   NA 137 11/30/2014 2154   NA 139 11/10/2014 0503   K 3.1* 11/30/2014 2154   K 3.6 11/10/2014 0503   CL 98* 11/30/2014 2154   CL 107 11/10/2014 0503   CO2 25 11/30/2014 2154   CO2 24 11/10/2014 0503   BUN 17 11/30/2014 2154   BUN 7 11/10/2014 0503   CREATININE 0.59 04/24/2015 1130   CREATININE 0.56 11/10/2014 0503      Component Value Date/Time   CALCIUM 8.8* 11/30/2014 2154   CALCIUM 7.8* 11/10/2014 0503   ALKPHOS 155* 11/09/2014 1254   AST 19 11/09/2014 1254   ALT 15 11/09/2014 1254   BILITOT 0.3 11/09/2014 1254       RADIOGRAPHIC STUDIES: I have  personally reviewed the radiological images as listed and agreed with the findings in the report. No results found.   ASSESSMENT & PLAN:   # Anemia- multifactorial/iron deficiency-B12 deficiency secondary to previous GI surgeries. Patient received IV iron in October 2016- had a great response. Today hemoglobin is 14.1 [previously 9-10].  No IV iron today.  #  B12 deficiency- continue B12 shots every  2 months.  #  Recheck CBC/ ferritin every 2 months/ possible IV iron based on iron studies.  #  Follow-up with me in 6 months.        Earna CoderGovinda R Johnn Krasowski, MD 11/13/2015 1:56 PM

## 2016-01-02 ENCOUNTER — Other Ambulatory Visit: Payer: Self-pay | Admitting: Family Medicine

## 2016-01-02 DIAGNOSIS — Z1231 Encounter for screening mammogram for malignant neoplasm of breast: Secondary | ICD-10-CM

## 2016-01-02 DIAGNOSIS — Z7185 Encounter for immunization safety counseling: Secondary | ICD-10-CM | POA: Insufficient documentation

## 2016-01-02 DIAGNOSIS — R7303 Prediabetes: Secondary | ICD-10-CM | POA: Insufficient documentation

## 2016-01-02 DIAGNOSIS — Z7189 Other specified counseling: Secondary | ICD-10-CM | POA: Insufficient documentation

## 2016-01-02 DIAGNOSIS — E7800 Pure hypercholesterolemia, unspecified: Secondary | ICD-10-CM | POA: Insufficient documentation

## 2016-01-10 ENCOUNTER — Other Ambulatory Visit: Payer: Medicare Other

## 2016-01-12 ENCOUNTER — Encounter: Payer: Self-pay | Admitting: Urology

## 2016-01-12 ENCOUNTER — Ambulatory Visit
Admission: RE | Admit: 2016-01-12 | Discharge: 2016-01-12 | Disposition: A | Discharge status: Home / Self Care | Payer: Medicare Other | Source: Ambulatory Visit | Attending: Urology | Admitting: Urology

## 2016-01-12 ENCOUNTER — Ambulatory Visit (INDEPENDENT_AMBULATORY_CARE_PROVIDER_SITE_OTHER): Payer: Medicare Other | Admitting: Urology

## 2016-01-12 VITALS — BP 132/77 | HR 84 | Ht 62.0 in | Wt 102.0 lb

## 2016-01-12 DIAGNOSIS — N200 Calculus of kidney: Secondary | ICD-10-CM

## 2016-01-12 DIAGNOSIS — I5022 Chronic systolic (congestive) heart failure: Secondary | ICD-10-CM | POA: Insufficient documentation

## 2016-01-12 NOTE — Progress Notes (Signed)
1:38 PM  01/12/2016  Lamount Crankeratricia B Shepperson 06-29-1941 [id number]  Referring provider: Marisue IvanKanhka Linthavong, MD [id number] [facility] [address] [facility] [place], [place] [id number]  Chief Complaint  Patient presents with  . Nephrolithiasis    1year with KUB    HPI: 75 year-old female recently treated for a 9 mm proximal ureteral stone in 10/2014.  She returns today for annual follow up.  She was initially seen as an inpatient while admitted on 10/28/2014 with a 9 mm proximal ureteral stone, nausea and vomiting. Her pain was poorly controlled her for she was taken to the operating room for LEFT ureteral stent placement.   She underwent L ESWL on 11/17/14 which she tolerated well.  Her stent was subsequently removed in the office.  Follow up renal ultrasound on 12/14/14 showed no evidence of hydronephrosis.  She did have a 4 mm left lower pole.   Stone analysis shows stone composition 97% calcium oxalate monohydrate, 3% calcium phosphate.  Overall, she's doing very well. She is no complaints today. No significant voiding symptoms today. No interval UTIs or stone episodes.    KUB today stable x 1 year.    PMH: Past Medical History  Diagnosis Date  . Arthritis   . Blood transfusion without reported diagnosis   . Dysrhythmia     TACHYCARDIA  . Headache     MIGRAINES  . GERD (gastroesophageal reflux disease)   . Anemia   . Renal disorder     Multiple episodes of kidney stones.   . Depression   . Edema     MILD OF ANKLES  . IDA (iron deficiency anemia)   . Microcytic anemia   . Multiple gastric ulcers   . B12 deficiency   . Migraine   . Hypercholesterolemia   . CHF (congestive heart failure) [facility](HCC)     Surgical History: Past Surgical History  Procedure Laterality Date  . Abdominal surgery      Reconstructed stomach and 3 different surgeries.   Marland Kitchen. Appendectomy    . Breast surgery      cyst removal  . Cholecystectomy    . Fracture surgery      HIP  . Cataract  extraction w/phaco Right 06/05/2015    Procedure: CATARACT EXTRACTION PHACO AND INTRAOCULAR LENS PLACEMENT (IOC);  Surgeon: Lockie Molahadwick Brasington, MD;  Location: [facility] ORS;  Service: Ophthalmology;  Laterality: Right;  [place] 01:06   . Kyphoplasty    . Cesarean section      x 3  . Colonoscopy    . Esophagogastroduodenoscopy      Home Medications:    Medication List       This list is accurate as of: 01/12/16  1:38 PM.  Always use your most recent med list.               amitriptyline 50 MG tablet  Commonly known as:  ELAVIL     FLUoxetine 10 MG capsule  Commonly known as:  PROZAC     furosemide 20 MG tablet  Commonly known as:  LASIX  Take 20 mg by mouth daily.     latanoprost 0.005 % ophthalmic solution  Commonly known as:  XALATAN     metoprolol tartrate 25 MG tablet  Commonly known as:  LOPRESSOR  Take by mouth.     ondansetron 4 MG disintegrating tablet  Commonly known as:  ZOFRAN-ODT  4 mg every 8 (eight) hours as needed.     pantoprazole 40 MG tablet  Commonly  known as:  PROTONIX  40 mg 2 (two) times daily.     SUMAtriptan 100 MG tablet  Commonly known as:  IMITREX  100 mg every 2 (two) hours as needed.     Vitamin D (Ergocalciferol) 50000 units Caps capsule  Commonly known as:  DRISDOL  Take by mouth.        Allergies:  Allergies  Allergen Reactions  . Penicillins Swelling and Rash  . Aspirin Other (See Comments)    GI bleed Stomach ulcers, bleeding   . Ferrous Gluconate Nausea Only    Cannot take in IV form  . Iron Polysaccharide Rash  . Nsaids Rash    Severe GI upset and prior ulcer  . Other Rash    Venofen- unknown  . Sulfamethoxazole-Trimethoprim Rash  . Venofer [Iron Sucrose] Rash    Family History: History reviewed. No pertinent family history.  Social History:  reports that she quit smoking about 22 years ago. Her smoking use included Cigarettes. She has never used smokeless tobacco. She reports that she drinks about 0.6 oz of alcohol  per week. She reports that she does not use illicit drugs.   Physical Exam: BP 132/77 mmHg  Pulse 84  Ht  (1.575 m)  Wt 102 lb (46.267 kg)  BMI 18.65 kg/m2  Constitutional:  Alert and oriented, No acute distress.  Frail.   HEENT: [place] AT, moist mucus membranes.  Trachea midline, no masses. Cardiovascular: No clubbing, cyanosis, or edema. Respiratory: Normal respiratory effort, no increased work of breathing. GI: Abdomen is soft, nontender, nondistended, no abdominal masses GU: No CVA tenderness.  Skin: No rashes, bruises or suspicious lesions. Neurologic: Grossly intact, no focal deficits, moving all 4 extremities. Psychiatric: Normal mood and affect.  Laboratory Data: Lab Results  Component Value Date   WBC 6.3 11/10/2015   HGB 14.1 11/10/2015   HCT 41.9 11/10/2015   MCV 88.5 11/10/2015   PLT 215 11/10/2015    Lab Results  Component Value Date   CREATININE 0.59 04/24/2015     Urinalysis   Pertinent Imaging: Study Result     CLINICAL DATA: Kidney stones. UTI. Appendectomy.  EXAM: ABDOMEN - 1 VIEW  COMPARISON: 12/05/2014. CT 11/30/2014.  FINDINGS: Surgical clips sutures noted throughout the abdomen. Enteric stent noted in stable position. Previously identified left ureteral double-J stent has been removed. Bilateral small renal calyceal stones. Calcifications in the pelvis are again noted most likely phleboliths. Distal uterus stones cannot be completely excluded. No bowel distention. Prior vertebroplasty. Postsurgical changes right hip.  IMPRESSION: 1. Bilateral small renal calyceal stones again noted.  2. Multiple pelvic calcifications again noted interim most likely phleboliths similar findings noted on prior exam. Distal ureteral stones cannot be completely excluded. Interval removal of left ureteral stent.   Electronically Signed  By: Maisie Fus Register  On: 01/12/2016 09:48   KUB reviewed today, stable compared to KUB 1 year  ago.  Assessment & Plan:    1. Calculus of kidney No recurrent stone episodes over the past year No flank pain AB stable from 1 year ago, no interval progression of stones Reviewed stone diet and adequate by mouth intake of water  I have recommended follow-up as needed at this point.    Return if symptoms worsen or fail to improve.  Vanna Scotland, MD  [facility] [address], [address] [place], [place] [address] [phone]

## 2016-01-15 ENCOUNTER — Inpatient Hospital Stay: Payer: Medicare Other | Attending: Internal Medicine

## 2016-01-15 DIAGNOSIS — D509 Iron deficiency anemia, unspecified: Secondary | ICD-10-CM | POA: Diagnosis present

## 2016-01-15 LAB — CBC WITH DIFFERENTIAL/PLATELET
BASOS ABS: 0.1 10*3/uL (ref 0–0.1)
BASOS PCT: 1 %
EOS ABS: 0.3 10*3/uL (ref 0–0.7)
EOS PCT: 4 %
HCT: 40.3 % (ref 35.0–47.0)
Hemoglobin: 13.6 g/dL (ref 12.0–16.0)
Lymphocytes Relative: 22 %
Lymphs Abs: 1.6 10*3/uL (ref 1.0–3.6)
MCH: 29.8 pg (ref 26.0–34.0)
MCHC: 33.7 g/dL (ref 32.0–36.0)
MCV: 88.5 fL (ref 80.0–100.0)
Monocytes Absolute: 0.6 10*3/uL (ref 0.2–0.9)
Monocytes Relative: 8 %
Neutro Abs: 4.6 10*3/uL (ref 1.4–6.5)
Neutrophils Relative %: 65 %
PLATELETS: 264 10*3/uL (ref 150–440)
RBC: 4.56 MIL/uL (ref 3.80–5.20)
RDW: 14 % (ref 11.5–14.5)
WBC: 7.2 10*3/uL (ref 3.6–11.0)

## 2016-01-15 LAB — FERRITIN: FERRITIN: 50 ng/mL (ref 11–307)

## 2016-01-17 ENCOUNTER — Inpatient Hospital Stay: Payer: Medicare Other

## 2016-01-17 ENCOUNTER — Other Ambulatory Visit: Payer: Self-pay | Admitting: Internal Medicine

## 2016-01-17 VITALS — BP 99/63 | HR 67 | Temp 96.8°F | Resp 18

## 2016-01-17 DIAGNOSIS — D509 Iron deficiency anemia, unspecified: Secondary | ICD-10-CM

## 2016-01-17 DIAGNOSIS — E538 Deficiency of other specified B group vitamins: Secondary | ICD-10-CM

## 2016-01-17 MED ORDER — CYANOCOBALAMIN 1000 MCG/ML IJ SOLN
1000.0000 ug | Freq: Once | INTRAMUSCULAR | Status: AC
  Administered 2016-01-17: 1000 ug via INTRAMUSCULAR
  Filled 2016-01-17: qty 1

## 2016-01-17 MED ORDER — FERUMOXYTOL INJECTION 510 MG/17 ML
510.0000 mg | Freq: Once | INTRAVENOUS | Status: AC
  Administered 2016-01-17: 510 mg via INTRAVENOUS
  Filled 2016-01-17: qty 17

## 2016-01-18 ENCOUNTER — Ambulatory Visit: Payer: Medicare Other

## 2016-03-18 ENCOUNTER — Inpatient Hospital Stay: Payer: Medicare Other | Attending: Internal Medicine

## 2016-03-18 DIAGNOSIS — Z79899 Other long term (current) drug therapy: Secondary | ICD-10-CM | POA: Insufficient documentation

## 2016-03-18 DIAGNOSIS — E538 Deficiency of other specified B group vitamins: Secondary | ICD-10-CM | POA: Insufficient documentation

## 2016-03-18 DIAGNOSIS — D509 Iron deficiency anemia, unspecified: Secondary | ICD-10-CM

## 2016-03-18 LAB — CBC WITH DIFFERENTIAL/PLATELET
BASOS ABS: 0.1 10*3/uL (ref 0–0.1)
BASOS PCT: 1 %
Eosinophils Absolute: 0.4 10*3/uL (ref 0–0.7)
Eosinophils Relative: 5 %
HEMATOCRIT: 39.8 % (ref 35.0–47.0)
Hemoglobin: 13.4 g/dL (ref 12.0–16.0)
LYMPHS PCT: 17 %
Lymphs Abs: 1.5 10*3/uL (ref 1.0–3.6)
MCH: 30.1 pg (ref 26.0–34.0)
MCHC: 33.6 g/dL (ref 32.0–36.0)
MCV: 89.6 fL (ref 80.0–100.0)
MONO ABS: 0.7 10*3/uL (ref 0.2–0.9)
Monocytes Relative: 8 %
NEUTROS ABS: 6.1 10*3/uL (ref 1.4–6.5)
Neutrophils Relative %: 69 %
PLATELETS: 273 10*3/uL (ref 150–440)
RBC: 4.45 MIL/uL (ref 3.80–5.20)
RDW: 14.1 % (ref 11.5–14.5)
WBC: 8.7 10*3/uL (ref 3.6–11.0)

## 2016-03-18 LAB — FERRITIN: Ferritin: 161 ng/mL (ref 11–307)

## 2016-03-20 ENCOUNTER — Inpatient Hospital Stay: Payer: Medicare Other

## 2016-03-20 DIAGNOSIS — D509 Iron deficiency anemia, unspecified: Secondary | ICD-10-CM

## 2016-03-20 DIAGNOSIS — E538 Deficiency of other specified B group vitamins: Secondary | ICD-10-CM | POA: Diagnosis not present

## 2016-03-20 MED ORDER — CYANOCOBALAMIN 1000 MCG/ML IJ SOLN
1000.0000 ug | Freq: Once | INTRAMUSCULAR | Status: AC
  Administered 2016-03-20: 1000 ug via INTRAMUSCULAR
  Filled 2016-03-20: qty 1

## 2016-05-06 DIAGNOSIS — Z0000 Encounter for general adult medical examination without abnormal findings: Secondary | ICD-10-CM | POA: Insufficient documentation

## 2016-05-15 ENCOUNTER — Other Ambulatory Visit: Payer: Self-pay | Admitting: *Deleted

## 2016-05-15 ENCOUNTER — Other Ambulatory Visit: Payer: Medicare Other

## 2016-05-15 ENCOUNTER — Inpatient Hospital Stay: Payer: Medicare Other | Attending: Internal Medicine

## 2016-05-15 DIAGNOSIS — R000 Tachycardia, unspecified: Secondary | ICD-10-CM | POA: Insufficient documentation

## 2016-05-15 DIAGNOSIS — D509 Iron deficiency anemia, unspecified: Secondary | ICD-10-CM

## 2016-05-15 DIAGNOSIS — K219 Gastro-esophageal reflux disease without esophagitis: Secondary | ICD-10-CM | POA: Diagnosis not present

## 2016-05-15 DIAGNOSIS — E7800 Pure hypercholesterolemia, unspecified: Secondary | ICD-10-CM | POA: Insufficient documentation

## 2016-05-15 DIAGNOSIS — Z87442 Personal history of urinary calculi: Secondary | ICD-10-CM | POA: Diagnosis not present

## 2016-05-15 DIAGNOSIS — D649 Anemia, unspecified: Secondary | ICD-10-CM | POA: Diagnosis not present

## 2016-05-15 DIAGNOSIS — I509 Heart failure, unspecified: Secondary | ICD-10-CM | POA: Insufficient documentation

## 2016-05-15 DIAGNOSIS — Z8711 Personal history of peptic ulcer disease: Secondary | ICD-10-CM | POA: Insufficient documentation

## 2016-05-15 DIAGNOSIS — Z87891 Personal history of nicotine dependence: Secondary | ICD-10-CM | POA: Insufficient documentation

## 2016-05-15 DIAGNOSIS — E538 Deficiency of other specified B group vitamins: Secondary | ICD-10-CM

## 2016-05-15 DIAGNOSIS — Z79899 Other long term (current) drug therapy: Secondary | ICD-10-CM | POA: Insufficient documentation

## 2016-05-15 DIAGNOSIS — Z86010 Personal history of colonic polyps: Secondary | ICD-10-CM | POA: Insufficient documentation

## 2016-05-15 DIAGNOSIS — Z8669 Personal history of other diseases of the nervous system and sense organs: Secondary | ICD-10-CM | POA: Insufficient documentation

## 2016-05-15 DIAGNOSIS — M129 Arthropathy, unspecified: Secondary | ICD-10-CM | POA: Diagnosis not present

## 2016-05-15 DIAGNOSIS — F329 Major depressive disorder, single episode, unspecified: Secondary | ICD-10-CM | POA: Insufficient documentation

## 2016-05-15 LAB — FERRITIN: Ferritin: 136 ng/mL (ref 11–307)

## 2016-05-15 LAB — VITAMIN B12: Vitamin B-12: 417 pg/mL (ref 180–914)

## 2016-05-15 LAB — CBC WITH DIFFERENTIAL/PLATELET
Basophils Absolute: 0.1 10*3/uL (ref 0–0.1)
Basophils Relative: 1 %
Eosinophils Absolute: 0.1 10*3/uL (ref 0–0.7)
Eosinophils Relative: 1 %
HCT: 39.2 % (ref 35.0–47.0)
HEMOGLOBIN: 12.9 g/dL (ref 12.0–16.0)
LYMPHS ABS: 1.2 10*3/uL (ref 1.0–3.6)
LYMPHS PCT: 14 %
MCH: 29.3 pg (ref 26.0–34.0)
MCHC: 32.9 g/dL (ref 32.0–36.0)
MCV: 88.9 fL (ref 80.0–100.0)
Monocytes Absolute: 0.6 10*3/uL (ref 0.2–0.9)
Monocytes Relative: 7 %
NEUTROS ABS: 6.6 10*3/uL — AB (ref 1.4–6.5)
NEUTROS PCT: 77 %
Platelets: 265 10*3/uL (ref 150–440)
RBC: 4.41 MIL/uL (ref 3.80–5.20)
RDW: 13.7 % (ref 11.5–14.5)
WBC: 8.6 10*3/uL (ref 3.6–11.0)

## 2016-05-17 ENCOUNTER — Inpatient Hospital Stay: Payer: Medicare Other

## 2016-05-17 ENCOUNTER — Inpatient Hospital Stay (HOSPITAL_BASED_OUTPATIENT_CLINIC_OR_DEPARTMENT_OTHER): Payer: Medicare Other | Admitting: Internal Medicine

## 2016-05-17 ENCOUNTER — Other Ambulatory Visit: Payer: Self-pay

## 2016-05-17 VITALS — BP 121/77 | HR 97 | Temp 97.1°F | Resp 18 | Wt 96.2 lb

## 2016-05-17 DIAGNOSIS — D509 Iron deficiency anemia, unspecified: Secondary | ICD-10-CM

## 2016-05-17 DIAGNOSIS — R000 Tachycardia, unspecified: Secondary | ICD-10-CM

## 2016-05-17 DIAGNOSIS — E538 Deficiency of other specified B group vitamins: Secondary | ICD-10-CM | POA: Diagnosis not present

## 2016-05-17 DIAGNOSIS — Z87442 Personal history of urinary calculi: Secondary | ICD-10-CM

## 2016-05-17 DIAGNOSIS — Z87891 Personal history of nicotine dependence: Secondary | ICD-10-CM

## 2016-05-17 DIAGNOSIS — M129 Arthropathy, unspecified: Secondary | ICD-10-CM | POA: Diagnosis not present

## 2016-05-17 DIAGNOSIS — I509 Heart failure, unspecified: Secondary | ICD-10-CM

## 2016-05-17 DIAGNOSIS — Z8669 Personal history of other diseases of the nervous system and sense organs: Secondary | ICD-10-CM

## 2016-05-17 DIAGNOSIS — Z86010 Personal history of colonic polyps: Secondary | ICD-10-CM

## 2016-05-17 DIAGNOSIS — E7800 Pure hypercholesterolemia, unspecified: Secondary | ICD-10-CM

## 2016-05-17 DIAGNOSIS — D649 Anemia, unspecified: Secondary | ICD-10-CM | POA: Diagnosis not present

## 2016-05-17 DIAGNOSIS — Z862 Personal history of diseases of the blood and blood-forming organs and certain disorders involving the immune mechanism: Secondary | ICD-10-CM

## 2016-05-17 DIAGNOSIS — Z79899 Other long term (current) drug therapy: Secondary | ICD-10-CM

## 2016-05-17 DIAGNOSIS — D500 Iron deficiency anemia secondary to blood loss (chronic): Secondary | ICD-10-CM

## 2016-05-17 DIAGNOSIS — K219 Gastro-esophageal reflux disease without esophagitis: Secondary | ICD-10-CM

## 2016-05-17 DIAGNOSIS — Z8711 Personal history of peptic ulcer disease: Secondary | ICD-10-CM

## 2016-05-17 DIAGNOSIS — F329 Major depressive disorder, single episode, unspecified: Secondary | ICD-10-CM

## 2016-05-17 MED ORDER — CYANOCOBALAMIN 1000 MCG/ML IJ SOLN
1000.0000 ug | Freq: Once | INTRAMUSCULAR | Status: AC
  Administered 2016-05-17: 1000 ug via INTRAMUSCULAR
  Filled 2016-05-17: qty 1

## 2016-05-17 NOTE — Assessment & Plan Note (Signed)
#   Anemia- multifactorial/iron deficiency-B12 deficiency secondary to previous GI surgeries. Patient received IV iron in June 2017-had a great response. Today hemoglobin is 12.9 [previously 9-10]. No IV iron today.  #  B12 deficiency- continue B12 shots every 2 months.  #  Recheck CBC/ ferritin every 4 months/ possible IV iron based on iron studies; same labs in 8 months/MD/ few days prior.

## 2016-05-17 NOTE — Progress Notes (Signed)
[facility] OFFICE PROGRESS NOTE  Patient Care Team: Marisue Ivan, MD as PCP - General (Family Medicine)   SUMMARY OF ONCOLOGIC HISTORY:  # ANEMIA- Multifactorial- Iron def/ B12 def [previous stomach surgeries/PUD]- IV iron last October 2016/  # B12  IM injections q 2 MONTHS [[facility]]  INTERVAL HISTORY:  A pleasant 75 year old female patient with above history of anemia likely secondary to malabsorption/question chronic GI blood loss- currently on IV iron/B12 shot is here for follow-up.  Patient last received IV iron in June 2017.  Energy levels are good. Denies any blood in stool occult stools. She has poor tolerance of by mouth iron.   REVIEW OF SYSTEMS:  A complete 10 point review of system is done which is negative except mentioned above/history of present illness.   PAST MEDICAL HISTORY :  Past Medical History:  Diagnosis Date  . Anemia   . Arthritis   . B12 deficiency   . Blood transfusion without reported diagnosis   . CHF (congestive heart failure) (HCC)   . Depression   . Dysrhythmia    TACHYCARDIA  . Edema    MILD OF ANKLES  . GERD (gastroesophageal reflux disease)   . Headache    MIGRAINES  . Hypercholesterolemia   . IDA (iron deficiency anemia)   . Microcytic anemia   . Migraine   . Multiple gastric ulcers   . Renal disorder    Multiple episodes of kidney stones.     PAST SURGICAL HISTORY :   Past Surgical History:  Procedure Laterality Date  . ABDOMINAL SURGERY     Reconstructed stomach and 3 different surgeries.   . APPENDECTOMY    . BREAST SURGERY     cyst removal  . CATARACT EXTRACTION W/PHACO Right 06/05/2015   Procedure: CATARACT EXTRACTION PHACO AND INTRAOCULAR LENS PLACEMENT (IOC);  Surgeon: Lockie Mola, MD;  Location: [facility] ORS;  Service: Ophthalmology;  Laterality: Right;  [place] 01:06   . CESAREAN SECTION     x 3  . CHOLECYSTECTOMY    . COLONOSCOPY    . ESOPHAGOGASTRODUODENOSCOPY    . FRACTURE SURGERY     HIP  .  KYPHOPLASTY      FAMILY HISTORY :  No family history on file.  SOCIAL HISTORY:   Social History  Substance Use Topics  . Smoking status: Former Smoker    Types: Cigarettes    Quit date: 11/29/1993  . Smokeless tobacco: Never Used  . Alcohol use 0.6 oz/week    1 Standard drinks or equivalent per week    ALLERGIES:  is allergic to penicillins; aspirin; ferrous gluconate; iron polysaccharide; nsaids; other; sulfamethoxazole-trimethoprim; and venofer [iron sucrose].  MEDICATIONS:  Current Outpatient Prescriptions  Medication Sig Dispense Refill  . amitriptyline (ELAVIL) 50 MG tablet     . FLUoxetine (PROZAC) 10 MG capsule     . furosemide (LASIX) 20 MG tablet Take 20 mg by mouth daily.    Marland Kitchen latanoprost (XALATAN) 0.005 % ophthalmic solution     . metoprolol tartrate (LOPRESSOR) 25 MG tablet Take by mouth.    . ondansetron (ZOFRAN-ODT) 4 MG disintegrating tablet 4 mg every 8 (eight) hours as needed.     . pantoprazole (PROTONIX) 40 MG tablet 40 mg 2 (two) times daily.     . SUMAtriptan (IMITREX) 100 MG tablet 100 mg every 2 (two) hours as needed.     . Vitamin D, Ergocalciferol, (DRISDOL) 50000 units CAPS capsule Take by mouth.     No current facility-administered  medications for this visit.     PHYSICAL EXAMINATION:  BP 121/77 (BP Location: Left Arm, Patient Position: Sitting)   Pulse 97   Temp 97.1 F (36.2 C) (Tympanic)   Resp 18   Wt 96 lb 4 oz (43.7 kg)   BMI 17.60 kg/m   Filed Weights   05/17/16 1356  Weight: 96 lb 4 oz (43.7 kg)    GENERAL: Thin built moderately nourished female patient Alert, no distress and comfortable. Accompanied by her husband. EYES: no pallor or icterus OROPHARYNX: no thrush or ulceration; good dentition  NECK: supple, no masses felt LYMPH:  no palpable lymphadenopathy in the cervical, axillary or inguinal regions LUNGS: clear to auscultation and  No wheeze or crackles HEART/CVS: regular rate & rhythm and no murmurs; No lower extremity  edema ABDOMEN:abdomen soft, non-tender and normal bowel sounds Musculoskeletal:no cyanosis of digits and no clubbing  PSYCH: alert & oriented x 3 with fluent speech NEURO: no focal motor/sensory deficits SKIN:  no rashes or significant lesions  LABORATORY DATA:  I have reviewed the data as listed    Component Value Date/Time   NA 137 11/30/2014 2154   NA 139 11/10/2014 0503   K 3.1 (L) 11/30/2014 2154   K 3.6 11/10/2014 0503   CL 98 (L) 11/30/2014 2154   CL 107 11/10/2014 0503   CO2 25 11/30/2014 2154   CO2 24 11/10/2014 0503   GLUCOSE 124 (H) 11/30/2014 2154   GLUCOSE 84 11/10/2014 0503   BUN 17 11/30/2014 2154   BUN 7 11/10/2014 0503   CREATININE 0.59 04/24/2015 1130   CREATININE 0.56 11/10/2014 0503   CALCIUM 8.8 (L) 11/30/2014 2154   CALCIUM 7.8 (L) 11/10/2014 0503   PROT 6.9 11/09/2014 1254   ALBUMIN 3.5 11/09/2014 1254   AST 19 11/09/2014 1254   ALT 15 11/09/2014 1254   ALKPHOS 155 (H) 11/09/2014 1254   BILITOT 0.3 11/09/2014 1254   GFRNONAA >60 04/24/2015 1130   GFRNONAA >60 11/10/2014 0503   GFRAA >60 04/24/2015 1130   GFRAA >60 11/10/2014 0503    No results found for: SPEP, UPEP  Lab Results  Component Value Date   WBC 8.6 05/15/2016   NEUTROABS 6.6 (H) 05/15/2016   HGB 12.9 05/15/2016   HCT 39.2 05/15/2016   MCV 88.9 05/15/2016   PLT 265 05/15/2016      Chemistry      Component Value Date/Time   NA 137 11/30/2014 2154   NA 139 11/10/2014 0503   K 3.1 (L) 11/30/2014 2154   K 3.6 11/10/2014 0503   CL 98 (L) 11/30/2014 2154   CL 107 11/10/2014 0503   CO2 25 11/30/2014 2154   CO2 24 11/10/2014 0503   BUN 17 11/30/2014 2154   BUN 7 11/10/2014 0503   CREATININE 0.59 04/24/2015 1130   CREATININE 0.56 11/10/2014 0503      Component Value Date/Time   CALCIUM 8.8 (L) 11/30/2014 2154   CALCIUM 7.8 (L) 11/10/2014 0503   ALKPHOS 155 (H) 11/09/2014 1254   AST 19 11/09/2014 1254   ALT 15 11/09/2014 1254   BILITOT 0.3 11/09/2014 1254        RADIOGRAPHIC STUDIES: I have personally reviewed the radiological images as listed and agreed with the findings in the report. No results found.   ASSESSMENT & PLAN:  Iron deficiency anemia due to chronic blood loss # Anemia- multifactorial/iron deficiency-B12 deficiency secondary to previous GI surgeries. Patient received IV iron in June 2017-had a great response. Today hemoglobin  is 12.9 [previously 9-10]. No IV iron today.  #  B12 deficiency- continue B12 shots every 2 months.  #  Recheck CBC/ ferritin every 4 months/ possible IV iron based on iron studies; same labs in 8 months/MD/ few days prior.      Earna CoderGovinda R Logun Colavito, MD 05/17/2016 3:02 PM

## 2016-07-16 DIAGNOSIS — S32609A Unspecified fracture of unspecified ischium, initial encounter for closed fracture: Secondary | ICD-10-CM | POA: Insufficient documentation

## 2016-07-19 ENCOUNTER — Inpatient Hospital Stay: Payer: Medicare Other | Attending: Internal Medicine

## 2016-09-18 ENCOUNTER — Inpatient Hospital Stay: Payer: Medicare Other

## 2016-09-20 ENCOUNTER — Inpatient Hospital Stay: Payer: Medicare Other

## 2016-11-15 ENCOUNTER — Inpatient Hospital Stay: Payer: Medicare Other | Attending: Internal Medicine

## 2016-11-15 DIAGNOSIS — Z79899 Other long term (current) drug therapy: Secondary | ICD-10-CM | POA: Diagnosis not present

## 2016-11-15 DIAGNOSIS — E538 Deficiency of other specified B group vitamins: Secondary | ICD-10-CM | POA: Insufficient documentation

## 2016-11-15 DIAGNOSIS — D509 Iron deficiency anemia, unspecified: Secondary | ICD-10-CM

## 2016-11-15 MED ORDER — CYANOCOBALAMIN 1000 MCG/ML IJ SOLN
1000.0000 ug | Freq: Once | INTRAMUSCULAR | Status: AC
  Administered 2016-11-15: 1000 ug via INTRAMUSCULAR
  Filled 2016-11-15: qty 1

## 2016-11-15 MED ORDER — DIPHENHYDRAMINE HCL 50 MG/ML IJ SOLN: 25.0000 mg | Freq: Once | INTRAMUSCULAR | Status: DC

## 2016-11-15 MED ORDER — SODIUM CHLORIDE 0.9 % IV SOLN: 510.0000 mg | Freq: Once | INTRAVENOUS | Status: DC

## 2016-12-24 ENCOUNTER — Emergency Department: Payer: Medicare Other

## 2016-12-24 ENCOUNTER — Encounter: Payer: Self-pay | Admitting: Emergency Medicine

## 2016-12-24 ENCOUNTER — Inpatient Hospital Stay
Admission: EM | Admit: 2016-12-24 | Discharge: 2016-12-31 | DRG: 481 | Disposition: A | Discharge status: Skilled Nursing Facility | Payer: Medicare Other | Attending: Internal Medicine | Admitting: Internal Medicine

## 2016-12-24 DIAGNOSIS — Z681 Body mass index (BMI) 19 or less, adult: Secondary | ICD-10-CM

## 2016-12-24 DIAGNOSIS — F0391 Unspecified dementia with behavioral disturbance: Secondary | ICD-10-CM | POA: Diagnosis present

## 2016-12-24 DIAGNOSIS — F332 Major depressive disorder, recurrent severe without psychotic features: Secondary | ICD-10-CM

## 2016-12-24 DIAGNOSIS — E876 Hypokalemia: Secondary | ICD-10-CM | POA: Diagnosis present

## 2016-12-24 DIAGNOSIS — Z66 Do not resuscitate: Secondary | ICD-10-CM | POA: Diagnosis present

## 2016-12-24 DIAGNOSIS — K219 Gastro-esophageal reflux disease without esophagitis: Secondary | ICD-10-CM | POA: Diagnosis present

## 2016-12-24 DIAGNOSIS — E46 Unspecified protein-calorie malnutrition: Secondary | ICD-10-CM | POA: Diagnosis present

## 2016-12-24 DIAGNOSIS — S72145A Nondisplaced intertrochanteric fracture of left femur, initial encounter for closed fracture: Principal | ICD-10-CM | POA: Diagnosis present

## 2016-12-24 DIAGNOSIS — R451 Restlessness and agitation: Secondary | ICD-10-CM | POA: Diagnosis not present

## 2016-12-24 DIAGNOSIS — R339 Retention of urine, unspecified: Secondary | ICD-10-CM | POA: Diagnosis present

## 2016-12-24 DIAGNOSIS — F333 Major depressive disorder, recurrent, severe with psychotic symptoms: Secondary | ICD-10-CM | POA: Diagnosis present

## 2016-12-24 DIAGNOSIS — R627 Adult failure to thrive: Secondary | ICD-10-CM | POA: Diagnosis present

## 2016-12-24 DIAGNOSIS — Z515 Encounter for palliative care: Secondary | ICD-10-CM | POA: Diagnosis present

## 2016-12-24 DIAGNOSIS — Z961 Presence of intraocular lens: Secondary | ICD-10-CM | POA: Diagnosis present

## 2016-12-24 DIAGNOSIS — E7800 Pure hypercholesterolemia, unspecified: Secondary | ICD-10-CM | POA: Diagnosis present

## 2016-12-24 DIAGNOSIS — W19XXXA Unspecified fall, initial encounter: Secondary | ICD-10-CM | POA: Diagnosis present

## 2016-12-24 DIAGNOSIS — E860 Dehydration: Secondary | ICD-10-CM | POA: Diagnosis present

## 2016-12-24 DIAGNOSIS — Z9841 Cataract extraction status, right eye: Secondary | ICD-10-CM

## 2016-12-24 DIAGNOSIS — M810 Age-related osteoporosis without current pathological fracture: Secondary | ICD-10-CM | POA: Diagnosis present

## 2016-12-24 DIAGNOSIS — G8929 Other chronic pain: Secondary | ICD-10-CM | POA: Diagnosis present

## 2016-12-24 DIAGNOSIS — I5042 Chronic combined systolic (congestive) and diastolic (congestive) heart failure: Secondary | ICD-10-CM | POA: Diagnosis present

## 2016-12-24 DIAGNOSIS — Y92238 Other place in hospital as the place of occurrence of the external cause: Secondary | ICD-10-CM | POA: Diagnosis present

## 2016-12-24 DIAGNOSIS — Z79899 Other long term (current) drug therapy: Secondary | ICD-10-CM

## 2016-12-24 DIAGNOSIS — Z87891 Personal history of nicotine dependence: Secondary | ICD-10-CM

## 2016-12-24 DIAGNOSIS — S72002A Fracture of unspecified part of neck of left femur, initial encounter for closed fracture: Secondary | ICD-10-CM

## 2016-12-24 DIAGNOSIS — Z9114 Patient's other noncompliance with medication regimen: Secondary | ICD-10-CM

## 2016-12-24 DIAGNOSIS — R6251 Failure to thrive (child): Secondary | ICD-10-CM

## 2016-12-24 DIAGNOSIS — D62 Acute posthemorrhagic anemia: Secondary | ICD-10-CM | POA: Diagnosis not present

## 2016-12-24 DIAGNOSIS — F419 Anxiety disorder, unspecified: Secondary | ICD-10-CM | POA: Diagnosis present

## 2016-12-24 DIAGNOSIS — M25559 Pain in unspecified hip: Secondary | ICD-10-CM

## 2016-12-24 DIAGNOSIS — M1990 Unspecified osteoarthritis, unspecified site: Secondary | ICD-10-CM | POA: Diagnosis present

## 2016-12-24 HISTORY — DX: Failure to thrive (child): R62.51

## 2016-12-24 LAB — COMPREHENSIVE METABOLIC PANEL
ALBUMIN: 2.4 g/dL — AB (ref 3.5–5.0)
ALT: 16 U/L (ref 14–54)
AST: 25 U/L (ref 15–41)
Alkaline Phosphatase: 134 U/L — ABNORMAL HIGH (ref 38–126)
Anion gap: 7 (ref 5–15)
BUN: 16 mg/dL (ref 6–20)
CHLORIDE: 106 mmol/L (ref 101–111)
CO2: 22 mmol/L (ref 22–32)
Calcium: 8.4 mg/dL — ABNORMAL LOW (ref 8.9–10.3)
Creatinine, Ser: 0.69 mg/dL (ref 0.44–1.00)
GFR calc Af Amer: >60 mL/min (ref >60)
GFR calc non Af Amer: >60 mL/min (ref >60)
GLUCOSE: 94 mg/dL (ref 65–99)
Potassium: 3.7 mmol/L (ref 3.5–5.1)
SODIUM: 135 mmol/L (ref 135–145)
Total Bilirubin: 0.3 mg/dL (ref 0.3–1.2)
Total Protein: 5.5 g/dL — ABNORMAL LOW (ref 6.5–8.1)

## 2016-12-24 LAB — CBC
HEMATOCRIT: 36.7 % (ref 35.0–47.0)
HEMOGLOBIN: 12.1 g/dL (ref 12.0–16.0)
MCH: 28.2 pg (ref 26.0–34.0)
MCHC: 32.8 g/dL (ref 32.0–36.0)
MCV: 85.9 fL (ref 80.0–100.0)
Platelets: 326 10*3/uL (ref 150–440)
RBC: 4.28 MIL/uL (ref 3.80–5.20)
RDW: 15.6 % — AB (ref 11.5–14.5)
WBC: 8.2 10*3/uL (ref 3.6–11.0)

## 2016-12-24 LAB — URINALYSIS, COMPLETE (UACMP) WITH MICROSCOPIC
BACTERIA UA: NONE SEEN
Bilirubin Urine: NEGATIVE
Glucose, UA: NEGATIVE mg/dL
Hgb urine dipstick: NEGATIVE
KETONES UR: NEGATIVE mg/dL
Leukocytes, UA: NEGATIVE
Nitrite: NEGATIVE
PROTEIN: NEGATIVE mg/dL
Specific Gravity, Urine: 1.011 (ref 1.005–1.030)
pH: 5 (ref 5.0–8.0)

## 2016-12-24 MED ORDER — LATANOPROST 0.005 % OP SOLN
1.0000 [drp] | Freq: Every day | OPHTHALMIC | Status: DC
  Administered 2016-12-25 – 2016-12-29 (×5): 1 [drp] via OPHTHALMIC
  Filled 2016-12-24 (×2): qty 2.5

## 2016-12-24 MED ORDER — FLUOXETINE HCL 20 MG PO CAPS
30.0000 mg | ORAL_CAPSULE | Freq: Every day | ORAL | Status: DC
  Administered 2016-12-24 – 2016-12-31 (×8): 30 mg via ORAL
  Filled 2016-12-24 (×13): qty 1

## 2016-12-24 MED ORDER — SUMATRIPTAN SUCCINATE 50 MG PO TABS
100.0000 mg | ORAL_TABLET | ORAL | Status: DC | PRN
  Administered 2016-12-25: 100 mg via ORAL
  Filled 2016-12-24 (×2): qty 1

## 2016-12-24 MED ORDER — AMITRIPTYLINE HCL 50 MG PO TABS
50.0000 mg | ORAL_TABLET | Freq: Every day | ORAL | Status: DC
  Administered 2016-12-25 – 2016-12-30 (×6): 50 mg via ORAL
  Filled 2016-12-24 (×8): qty 1
  Filled 2016-12-24: qty 5
  Filled 2016-12-24: qty 1
  Filled 2016-12-24: qty 5
  Filled 2016-12-24: qty 1

## 2016-12-24 MED ORDER — TRAMADOL HCL 50 MG PO TABS
50.0000 mg | ORAL_TABLET | Freq: Once | ORAL | Status: AC
Start: 2016-12-24 — End: 2016-12-24
  Administered 2016-12-24: 50 mg via ORAL
  Filled 2016-12-24: qty 1

## 2016-12-24 MED ORDER — HALOPERIDOL LACTATE 5 MG/ML IJ SOLN
5.0000 mg | Freq: Once | INTRAMUSCULAR | Status: AC
  Administered 2016-12-24: 5 mg via INTRAMUSCULAR
  Filled 2016-12-24: qty 1

## 2016-12-24 MED ORDER — FUROSEMIDE 40 MG PO TABS
20.0000 mg | ORAL_TABLET | Freq: Every day | ORAL | Status: DC
  Administered 2016-12-25 – 2016-12-27 (×3): 20 mg via ORAL
  Filled 2016-12-24 (×3): qty 1

## 2016-12-24 MED ORDER — METOPROLOL TARTRATE 25 MG PO TABS
25.0000 mg | ORAL_TABLET | Freq: Two times a day (BID) | ORAL | Status: DC
  Administered 2016-12-25 – 2016-12-26 (×3): 25 mg via ORAL
  Filled 2016-12-24 (×4): qty 1

## 2016-12-24 MED ORDER — PANTOPRAZOLE SODIUM 40 MG PO TBEC
40.0000 mg | DELAYED_RELEASE_TABLET | Freq: Every day | ORAL | Status: DC
  Administered 2016-12-24 – 2016-12-31 (×8): 40 mg via ORAL
  Filled 2016-12-24 (×8): qty 1

## 2016-12-24 MED ORDER — SODIUM CHLORIDE 0.9 % IV BOLUS (SEPSIS): 1000.0000 mL | Freq: Once | INTRAVENOUS | Status: DC

## 2016-12-24 NOTE — ED Notes (Signed)
Pharmacy called about Proxac capsule, states they will send it to ED.

## 2016-12-24 NOTE — ED Notes (Signed)
Pt taking in water
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
## 2016-12-24 NOTE — ED Notes (Signed)
Patient dressed out into scrubs. Patients belongings (pink robe, blue shirt, underwear and black sandal heels) all placed in belongings bag.

## 2016-12-24 NOTE — ED Notes (Signed)
Assisted patient with use of bedpan.

## 2016-12-24 NOTE — ED Triage Notes (Signed)
Arrives via [facility].  Has hx CHF, PUD - with several gastric resections and nutritional concerns.  Today, ambulance called due to patient not eating and agitation.  Patient's husband states they have been trying to get patient to PCP for evaluation, but patient refuses.  Spouse reports a 20 pound weight loss over the past 6 months.  Has been diagnosed with failure to thrive.

## 2016-12-24 NOTE — ED Notes (Signed)
Husband taking patient's belongings home with him.  Husband given patient's passcode of [id number] to use if he needs to call or to use for visitation tomorrow.  Husband given visitations hours and telephone hours for tomorrow.
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
## 2016-12-24 NOTE — ED Notes (Signed)
Pt given meal tray and coke.  
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
## 2016-12-25 LAB — URINE CULTURE: Culture: NO GROWTH

## 2016-12-25 NOTE — BH Assessment (Signed)
After speaking with Thelma BargeFrancis (Pt advocate) and Dr.Clapacs it was decided that per the family's request, pt's IVC would be rescinded. This is so family can arrange private transportation to geripsych facility once pt acceptance is received. Family is concerned that pt is physically unable to transport in Product managerlaw enforcement vehicle.  Clinician received a call from pt's husband Donivan Scull(Bejamin Fluegel). Husband provided additional phone numbers to ensure a family member is able to be reached once acceptance is received. Husband reiterated that private transport would be secured.   Vaughan BastaBenjamin Boys (Husband) Cell [phone], Home [phone] Rhae HammockMark Zwilling [facility](Son) [phone] Page Tiburcio PeaHarris (Daughter) [phone]

## 2016-12-25 NOTE — ED Provider Notes (Signed)
-----------------------------------------   7:08 AM on 12/25/2016 -----------------------------------------   Blood pressure 106/66, pulse (!) 106, temperature 98.1 F (36.7 C), temperature source Oral, resp. rate 17, height 5\' 3"  (1.6 m), weight 32.7 kg (72 lb), SpO2 98 %.  The patient had no acute events since last update.  Calm and cooperative at this time.  Disposition is pending Psychiatry/Behavioral Medicine team recommendations.     Irean HongSung, Carlisa Eble J, MD 12/25/16 [phone]

## 2016-12-25 NOTE — ED Notes (Signed)
Gave patient walker and assisted helping her to the bathroom.

## 2016-12-25 NOTE — ED Notes (Signed)
Spoke with her daughter on the phone  - update provided

## 2016-12-25 NOTE — ED Notes (Signed)
BEHAVIORAL HEALTH ROUNDING Patient sleeping: No. Patient alert and oriented: yes Behavior appropriate: Yes.  ; If no, describe:  Nutrition and fluids offered: yes Toileting and hygiene offered: Yes  Sitter present: q15 minute observations and security  monitoring Law enforcement present: Yes  ODS  

## 2016-12-25 NOTE — ED Notes (Signed)
DR CLAPACS RESCINDED IVC PAPERS/PT VOLUNTARY PENDING PLACEMENT.

## 2016-12-25 NOTE — ED Notes (Signed)
Patient observed lying in bed with eyes closed  Even, unlabored respirations observed   NAD pt appears to be sleeping  I will continue to monitor along with every 15 minute visual observations and ongoing security monitoring    

## 2016-12-25 NOTE — ED Notes (Signed)
She has ambulated to the Br with her walker and stand by assistance

## 2016-12-25 NOTE — ED Notes (Signed)
BEHAVIORAL HEALTH ROUNDING Patient sleeping: Yes.   Patient alert and oriented: eyes closed  Appears to be asleep Behavior appropriate: Yes.  ; If no, describe:  Nutrition and fluids offered: Yes  Toileting and hygiene offered: sleeping Sitter present: q 15 minute observations and security monitoring Law enforcement present: yes  ODS 

## 2016-12-25 NOTE — ED Notes (Addendum)
Awake - ambulatory to the BR with walker and stand by assistance  NAD assessed

## 2016-12-25 NOTE — ED Notes (Signed)
Pts son has called for an update - I provided him with update - visiting times - correct phone number  TTS referral to geripsych

## 2016-12-25 NOTE — Progress Notes (Signed)
Please note patient is currently open to home PALLIATIVE services. CMRN Berna BueCheryl Wilder made aware. Thank you. Dayna BarkerKaren Robertson RN,BSN, [facility], hospital Liaison [phone] c

## 2016-12-25 NOTE — BH Assessment (Signed)
Referral information for Geriatric Placement have been faxed to: [facility] ([phone]/[phone]) [facility] ([phone]/[phone]) [facility] ([phone]/[phone]) [facility] ([phone]/[phone]) [facility] ([phone]/[phone] 

## 2016-12-25 NOTE — ED Notes (Signed)
Pt ambulated to bathroom with wheelchair unassisted by staff. Pt returned to bed and is resting.

## 2016-12-25 NOTE — ED Notes (Signed)
Helped patient to bathroom. 

## 2016-12-25 NOTE — ED Notes (Signed)
Gave patient peanut butter and crackers. Gave her a coke to drink.

## 2016-12-25 NOTE — ED Notes (Signed)
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
## 2016-12-25 NOTE — ED Notes (Signed)
Pt ambulated to bathroom with walker and this tech walked next to pt. Pt had no issues and returned to bed. Pt was given lunch tray and coke and is eating some. Pt has visitor at this time.

## 2016-12-26 ENCOUNTER — Emergency Department: Payer: Medicare Other

## 2016-12-26 MED ORDER — TRAMADOL HCL 50 MG PO TABS
50.0000 mg | ORAL_TABLET | Freq: Four times a day (QID) | ORAL | Status: DC | PRN
  Administered 2016-12-26 – 2016-12-30 (×3): 50 mg via ORAL
  Filled 2016-12-26 (×4): qty 1

## 2016-12-26 MED ORDER — HALOPERIDOL 5 MG PO TABS
5.0000 mg | ORAL_TABLET | Freq: Once | ORAL | Status: AC
  Administered 2016-12-26: 5 mg via ORAL
  Filled 2016-12-26: qty 1

## 2016-12-26 NOTE — ED Notes (Signed)
X-rays performed at this time.  Will continue to monitor.

## 2016-12-26 NOTE — ED Notes (Signed)
BEHAVIORAL HEALTH ROUNDING Patient sleeping: Yes.   Patient alert and oriented: sleeping Behavior appropriate: Yes.  ; If no, describe:  Nutrition and fluids offered: Yes  Toileting and hygiene offered: Yes  Sitter present: q 15 min checks Law enforcement present: Yes  

## 2016-12-26 NOTE — ED Notes (Signed)
Patient's husband at bedside visiting  

## 2016-12-26 NOTE — ED Notes (Signed)
Patient changed to a hospital bed instead of a ED stretcher.  Linens clean and dry.  Patient reminded to eat her breakfast.

## 2016-12-26 NOTE — ED Notes (Signed)
Patient refused to take shower at this time.  Patient ate 100% of her breakfast.

## 2016-12-26 NOTE — ED Notes (Signed)
Per TTS, [facility] transfer [facility]. Roxana (TTS) made aware and is working on situation and placement.

## 2016-12-26 NOTE — ED Notes (Signed)
Dr. Toni Amendlapacs in room to re-assess patient.  Will continue to monitor.

## 2016-12-26 NOTE — ED Notes (Signed)
Attempted to call spouse x1.

## 2016-12-26 NOTE — ED Notes (Signed)
Pt still talking in sleep. Pt checked for urine and found to be wet. Clothes and bed are changed at this time. Pt back to sleep.

## 2016-12-26 NOTE — ED Notes (Signed)
Patient refused shower but patient agreed to a pan of water patient in room washing at this time

## 2016-12-26 NOTE — ED Notes (Signed)
BEHAVIORAL HEALTH ROUNDING Patient sleeping: No. Patient alert and oriented: yes Behavior appropriate: Yes.  ; If no, describe:  Nutrition and fluids offered: Yes  Toileting and hygiene offered: Yes  Sitter present: q 15 min checks Law enforcement present: Yes  

## 2016-12-26 NOTE — ED Notes (Signed)
Pt talking in sleep at this time. Pt is talking to someone and is upset.

## 2016-12-26 NOTE — ED Notes (Addendum)
Patient has been accepted to [facility].  Patient assigned to room 153 Accepting Welton FlakesVanessa Graham supervised by Dr. Loann Quillhivukula .  Call report to [phone].  Representative was [employer].  ER Staff is aware of it Marchelle Folks( [facility] & Tobi BastosAnna Patient's Nurse)    Patient's Family/Support System (Husband) have been updated as well.

## 2016-12-26 NOTE — ED Notes (Signed)
BEHAVIORAL HEALTH ROUNDING Patient sleeping: Yes.   Patient alert and oriented: Sleeping Behavior appropriate: Yes.  ; If no, describe:  Nutrition and fluids offered: Yes  Toileting and hygiene offered: Yes  Sitter present: q 15 min checks Law enforcement present: Yes

## 2016-12-26 NOTE — ED Notes (Signed)
Spoke with daughter Page about pt and plans at this point.

## 2016-12-26 NOTE — ED Notes (Signed)
Patient had 1 episode of diarrhea.  Patient was continent and this RN assisted her in the restroom.

## 2016-12-26 NOTE — ED Provider Notes (Signed)
 -----------------------------------------   5:36 PM on 12/26/2016 -----------------------------------------  Patient had a fall from standing this afternoon. Was complaining of pain in the coccyx area afterward. X-ray of the pelvis does not show any acute fractures but does so with some right pubic ramus fractures that appear to be old and associated with a right femoral neck fracture that's previously been repaired. Patient's pelvis is stable and nontender and she is ambulatory afterward. There is no evident acute injury, patient is calm comfortable and just finished her lunch. She remains medically stable. We'll continue on with her psychiatric referral.   Sharman CheekStafford, Gabrille Kilbride, MD 12/26/16 1737

## 2016-12-26 NOTE — ED Notes (Signed)
This RN heard a noise from patient's room and hurried in to see patient sitting on the floor behind her walker.  Patient states she was attempting to go to the bathroom.  Patient is complaining of pain to her coccyx area.  Patient ambulatory post fall.  MD notified.

## 2016-12-26 NOTE — ED Notes (Signed)
Patient awake going to restroom at this time

## 2016-12-26 NOTE — ED Notes (Signed)
Pt becoming verbally combative with staff at this time. Pt trying to get out of bed and unable to be redirected. [facility] aware.

## 2016-12-26 NOTE — ED Notes (Signed)
Patient given water and encouraged to drink. 

## 2016-12-26 NOTE — ED Notes (Signed)
Patient ate approx. 75% of her dinner tonight.

## 2016-12-26 NOTE — ED Notes (Signed)
Patient calling out for, "Ashley Young".  When this RN entered the room patient states, "Get away from me Goddammit."  Patient re-oriented and quickly calmed down.  Patient is behaving appropriately now.

## 2016-12-26 NOTE — ED Notes (Signed)
Per Roxana TTS, [facility] will reevaluate pt appropriatness for transfer in AM. Per husband, he wants to talk to Dr. Toni Amendlapacs due to concern about pt not being medically stable for transfer.

## 2016-12-26 NOTE — ED Notes (Signed)
Patient resting quietly. Patient ate approx. 60% of her lunch. Appears to be sleeping.  Will continue to monitor.

## 2016-12-26 NOTE — ED Provider Notes (Signed)
 -----------------------------------------   7:23 PM on 12/26/2016 -----------------------------------------  Vital signs remained stable. Patient is calm and comfortable. Remains medically stable for [facility].   Sharman CheekStafford, Korin Setzler, MD 12/26/16 [phone]

## 2016-12-27 DIAGNOSIS — S72002D Fracture of unspecified part of neck of left femur, subsequent encounter for closed fracture with routine healing: Secondary | ICD-10-CM | POA: Diagnosis not present

## 2016-12-27 DIAGNOSIS — E876 Hypokalemia: Secondary | ICD-10-CM | POA: Diagnosis not present

## 2016-12-27 DIAGNOSIS — Y92238 Other place in hospital as the place of occurrence of the external cause: Secondary | ICD-10-CM | POA: Diagnosis present

## 2016-12-27 DIAGNOSIS — G8929 Other chronic pain: Secondary | ICD-10-CM | POA: Diagnosis present

## 2016-12-27 DIAGNOSIS — M1990 Unspecified osteoarthritis, unspecified site: Secondary | ICD-10-CM | POA: Diagnosis present

## 2016-12-27 DIAGNOSIS — Z79899 Other long term (current) drug therapy: Secondary | ICD-10-CM | POA: Diagnosis not present

## 2016-12-27 DIAGNOSIS — Z87891 Personal history of nicotine dependence: Secondary | ICD-10-CM | POA: Diagnosis not present

## 2016-12-27 DIAGNOSIS — Z9841 Cataract extraction status, right eye: Secondary | ICD-10-CM | POA: Diagnosis not present

## 2016-12-27 DIAGNOSIS — S72145A Nondisplaced intertrochanteric fracture of left femur, initial encounter for closed fracture: Secondary | ICD-10-CM | POA: Diagnosis present

## 2016-12-27 DIAGNOSIS — E860 Dehydration: Secondary | ICD-10-CM | POA: Diagnosis present

## 2016-12-27 DIAGNOSIS — I5042 Chronic combined systolic (congestive) and diastolic (congestive) heart failure: Secondary | ICD-10-CM | POA: Diagnosis present

## 2016-12-27 DIAGNOSIS — F0391 Unspecified dementia with behavioral disturbance: Secondary | ICD-10-CM | POA: Diagnosis present

## 2016-12-27 DIAGNOSIS — Z681 Body mass index (BMI) 19 or less, adult: Secondary | ICD-10-CM | POA: Diagnosis not present

## 2016-12-27 DIAGNOSIS — K219 Gastro-esophageal reflux disease without esophagitis: Secondary | ICD-10-CM | POA: Diagnosis present

## 2016-12-27 DIAGNOSIS — W19XXXA Unspecified fall, initial encounter: Secondary | ICD-10-CM | POA: Diagnosis present

## 2016-12-27 DIAGNOSIS — D62 Acute posthemorrhagic anemia: Secondary | ICD-10-CM | POA: Diagnosis not present

## 2016-12-27 DIAGNOSIS — Z9114 Patient's other noncompliance with medication regimen: Secondary | ICD-10-CM | POA: Diagnosis not present

## 2016-12-27 DIAGNOSIS — F332 Major depressive disorder, recurrent severe without psychotic features: Secondary | ICD-10-CM | POA: Diagnosis not present

## 2016-12-27 DIAGNOSIS — M810 Age-related osteoporosis without current pathological fracture: Secondary | ICD-10-CM | POA: Diagnosis present

## 2016-12-27 DIAGNOSIS — E7800 Pure hypercholesterolemia, unspecified: Secondary | ICD-10-CM | POA: Diagnosis present

## 2016-12-27 DIAGNOSIS — F333 Major depressive disorder, recurrent, severe with psychotic symptoms: Secondary | ICD-10-CM | POA: Diagnosis present

## 2016-12-27 DIAGNOSIS — Z961 Presence of intraocular lens: Secondary | ICD-10-CM | POA: Diagnosis present

## 2016-12-27 DIAGNOSIS — Z515 Encounter for palliative care: Secondary | ICD-10-CM | POA: Diagnosis present

## 2016-12-27 DIAGNOSIS — E46 Unspecified protein-calorie malnutrition: Secondary | ICD-10-CM | POA: Diagnosis present

## 2016-12-27 DIAGNOSIS — Z7189 Other specified counseling: Secondary | ICD-10-CM | POA: Diagnosis not present

## 2016-12-27 DIAGNOSIS — R627 Adult failure to thrive: Secondary | ICD-10-CM | POA: Diagnosis present

## 2016-12-27 DIAGNOSIS — Z66 Do not resuscitate: Secondary | ICD-10-CM | POA: Diagnosis present

## 2016-12-27 DIAGNOSIS — F419 Anxiety disorder, unspecified: Secondary | ICD-10-CM | POA: Diagnosis present

## 2016-12-27 DIAGNOSIS — R451 Restlessness and agitation: Secondary | ICD-10-CM | POA: Diagnosis present

## 2016-12-27 DIAGNOSIS — R339 Retention of urine, unspecified: Secondary | ICD-10-CM | POA: Diagnosis present

## 2016-12-27 LAB — CREATININE, SERUM
CREATININE: 0.6 mg/dL (ref 0.44–1.00)
GFR calc Af Amer: >60 mL/min (ref >60)

## 2016-12-27 LAB — COMPREHENSIVE METABOLIC PANEL
ALK PHOS: 113 U/L (ref 38–126)
ALT: 21 U/L (ref 14–54)
AST: 21 U/L (ref 15–41)
Albumin: 2.4 g/dL — ABNORMAL LOW (ref 3.5–5.0)
Anion gap: 8 (ref 5–15)
BILIRUBIN TOTAL: 0.6 mg/dL (ref 0.3–1.2)
BUN: 11 mg/dL (ref 6–20)
CALCIUM: 8 mg/dL — AB (ref 8.9–10.3)
CO2: 29 mmol/L (ref 22–32)
CREATININE: 0.54 mg/dL (ref 0.44–1.00)
Chloride: 101 mmol/L (ref 101–111)
Glucose, Bld: 94 mg/dL (ref 65–99)
Potassium: 2.5 mmol/L — CL (ref 3.5–5.1)
Sodium: 138 mmol/L (ref 135–145)
TOTAL PROTEIN: 5.4 g/dL — AB (ref 6.5–8.1)

## 2016-12-27 LAB — CBC
HCT: 33.4 % — ABNORMAL LOW (ref 35.0–47.0)
Hemoglobin: 10.8 g/dL — ABNORMAL LOW (ref 12.0–16.0)
MCH: 28.1 pg (ref 26.0–34.0)
MCHC: 32.5 g/dL (ref 32.0–36.0)
MCV: 86.7 fL (ref 80.0–100.0)
PLATELETS: 259 10*3/uL (ref 150–440)
RBC: 3.85 MIL/uL (ref 3.80–5.20)
RDW: 15.2 % — AB (ref 11.5–14.5)
WBC: 6.6 10*3/uL (ref 3.6–11.0)

## 2016-12-27 LAB — MAGNESIUM: MAGNESIUM: 1.6 mg/dL — AB (ref 1.7–2.4)

## 2016-12-27 MED ORDER — PANTOPRAZOLE SODIUM 40 MG PO TBEC: 40.0000 mg | DELAYED_RELEASE_TABLET | Freq: Every day | ORAL | Status: DC

## 2016-12-27 MED ORDER — ACETAMINOPHEN 325 MG PO TABS: 650.0000 mg | ORAL_TABLET | Freq: Four times a day (QID) | ORAL | Status: DC | PRN

## 2016-12-27 MED ORDER — ACETAMINOPHEN 325 MG PO TABS
650.0000 mg | ORAL_TABLET | Freq: Once | ORAL | Status: AC
  Administered 2016-12-27: 650 mg via ORAL
  Filled 2016-12-27: qty 2

## 2016-12-27 MED ORDER — BISACODYL 5 MG PO TBEC: 5.0000 mg | DELAYED_RELEASE_TABLET | Freq: Every day | ORAL | Status: DC | PRN

## 2016-12-27 MED ORDER — MAGNESIUM SULFATE 2 GM/50ML IV SOLN
2.0000 g | Freq: Once | INTRAVENOUS | Status: AC
  Administered 2016-12-27: 2 g via INTRAVENOUS
  Filled 2016-12-27: qty 50

## 2016-12-27 MED ORDER — ONDANSETRON HCL 4 MG/2ML IJ SOLN: 4.0000 mg | Freq: Four times a day (QID) | INTRAMUSCULAR | Status: DC | PRN

## 2016-12-27 MED ORDER — ADULT MULTIVITAMIN W/MINERALS CH
1.0000 | ORAL_TABLET | Freq: Every day | ORAL | Status: DC
  Administered 2016-12-27 – 2016-12-31 (×5): 1 via ORAL
  Filled 2016-12-27 (×5): qty 1

## 2016-12-27 MED ORDER — ONDANSETRON HCL 4 MG PO TABS
4.0000 mg | ORAL_TABLET | Freq: Four times a day (QID) | ORAL | Status: DC | PRN
  Administered 2016-12-31: 4 mg via ORAL
  Filled 2016-12-27: qty 1

## 2016-12-27 MED ORDER — POTASSIUM CHLORIDE IN NACL 20-0.9 MEQ/L-% IV SOLN
INTRAVENOUS | Status: DC
  Administered 2016-12-27 – 2016-12-28 (×2): via INTRAVENOUS
  Filled 2016-12-27 (×4): qty 1000

## 2016-12-27 MED ORDER — ARIPIPRAZOLE 2 MG PO TABS
2.0000 mg | ORAL_TABLET | Freq: Every day | ORAL | Status: DC
  Administered 2016-12-28 – 2016-12-31 (×4): 2 mg via ORAL
  Filled 2016-12-27 (×7): qty 1

## 2016-12-27 MED ORDER — OXYCODONE HCL 5 MG PO TABS
5.0000 mg | ORAL_TABLET | Freq: Four times a day (QID) | ORAL | Status: DC | PRN
  Administered 2016-12-27 – 2016-12-30 (×6): 5 mg via ORAL
  Filled 2016-12-27 (×6): qty 1

## 2016-12-27 MED ORDER — POTASSIUM CHLORIDE 10 MEQ/100ML IV SOLN
10.0000 meq | INTRAVENOUS | Status: AC
  Administered 2016-12-27 (×2): 10 meq via INTRAVENOUS
  Filled 2016-12-27 (×4): qty 100

## 2016-12-27 MED ORDER — TAMSULOSIN HCL 0.4 MG PO CAPS
0.4000 mg | ORAL_CAPSULE | Freq: Every day | ORAL | Status: DC
  Administered 2016-12-27 – 2016-12-31 (×5): 0.4 mg via ORAL
  Filled 2016-12-27 (×5): qty 1

## 2016-12-27 MED ORDER — LATANOPROST 0.005 % OP SOLN: 1.0000 [drp] | Freq: Every day | OPHTHALMIC | Status: DC

## 2016-12-27 MED ORDER — ENOXAPARIN SODIUM 40 MG/0.4ML ~~LOC~~ SOLN
40.0000 mg | SUBCUTANEOUS | Status: DC
  Administered 2016-12-27: 40 mg via SUBCUTANEOUS
  Filled 2016-12-27: qty 0.4

## 2016-12-27 MED ORDER — MIRTAZAPINE 15 MG PO TBDP
7.5000 mg | ORAL_TABLET | Freq: Every day | ORAL | Status: DC
  Administered 2016-12-27 – 2016-12-30 (×4): 7.5 mg via ORAL
  Filled 2016-12-27: qty 0.5
  Filled 2016-12-27: qty 2
  Filled 2016-12-27: qty 0.5
  Filled 2016-12-27: qty 1
  Filled 2016-12-27 (×3): qty 0.5

## 2016-12-27 MED ORDER — TRAMADOL HCL 50 MG PO TABS: 50.0000 mg | ORAL_TABLET | Freq: Four times a day (QID) | ORAL | Status: DC | PRN

## 2016-12-27 MED ORDER — SENNOSIDES-DOCUSATE SODIUM 8.6-50 MG PO TABS: 1.0000 | ORAL_TABLET | Freq: Every evening | ORAL | Status: DC | PRN

## 2016-12-27 MED ORDER — CALCIUM CARBONATE-VITAMIN D 500-200 MG-UNIT PO TABS
1.0000 | ORAL_TABLET | Freq: Every day | ORAL | Status: DC
  Administered 2016-12-28 – 2016-12-31 (×4): 1 via ORAL
  Filled 2016-12-27 (×4): qty 1

## 2016-12-27 MED ORDER — FLUOXETINE HCL 10 MG PO CAPS: 10.0000 mg | ORAL_CAPSULE | Freq: Every day | ORAL | Status: DC

## 2016-12-27 MED ORDER — AMITRIPTYLINE HCL 10 MG PO TABS: 50.0000 mg | ORAL_TABLET | Freq: Every day | ORAL | Status: DC

## 2016-12-27 MED ORDER — ACETAMINOPHEN 650 MG RE SUPP: 650.0000 mg | Freq: Four times a day (QID) | RECTAL | Status: DC | PRN

## 2016-12-27 NOTE — Progress Notes (Signed)
Palliative Medicine Team  Due to high volume of referrals, there is a delay seeing this patient. [facility] not [facility] over the weekend but will arrange goals of care with patient and family on Monday. Thank you for the opportunity to participate in the care of Ms. Edgell.   Vennie HomansMegan Fany Cavanaugh, FNP-C Palliative Medicine Team  Phone: [phone] Fax: [phone]

## 2016-12-27 NOTE — ED Notes (Signed)
TTS has requested this RN call [facility] and speak to their intake staff. Call facility [phone]) and [facility] Beth states their needs to be an ejection fraction documented as well as a note put in that patient is stable. Dr. Alphonzo LemmingsMcshane notified and aware.

## 2016-12-27 NOTE — [place] FL2 (Signed)
[place] MEDICAID FL2 LEVEL OF CARE SCREENING TOOL     IDENTIFICATION  Patient Name: Ashley Young Birthdate: 05-May-1941 Sex: female Admission Date (Current Location): 12/24/2016  [facility] and [place] Number:  Chiropodist and Address:  [facility], [address], [place], [place] [address]      Provider Number: [id number]  Attending Physician Name and Address:  No att. providers found  Relative Name and Phone Number:  Ashley Young (Spouse) [phone]    Current Level of Care: Hospital Recommended Level of Care: Skilled Nursing Facility Prior Approval Number:    Date Approved/Denied:   PASRR Number:    Discharge Plan: SNF    Current Diagnoses: Patient Active Problem List   Diagnosis Date Noted  . Severe recurrent major depression without psychotic features (HCC) 12/24/2016  . Failure to thrive (0-17) 12/24/2016  . Chronic systolic heart failure (HCC) 01/12/2016  . Borderline diabetes mellitus 01/02/2016  . Pure hypercholesterolemia 01/02/2016  . Other specified counseling 01/02/2016  . B12 deficiency 04/26/2015  . Iron deficiency anemia due to chronic blood loss 04/26/2015  . History of anemia 03/28/2015  . 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 DDD (degenerative disc disease), lumbar 03/09/2014  . Degeneration of intervertebral disc of lumbar region 03/09/2014  . Avitaminosis D 02/09/2014  . Anxiety state 01/04/2014  . Clinical depression 01/04/2014  . Edema leg 01/04/2014  . Recurrent major depression in remission (HCC) 01/04/2014  . Abdominal pain 11/19/2013  . Acquired gastric outlet stenosis 07/30/2013  . Adverse effect of salicylate 07/29/2013  . Anxiety and depression 08/01/2013  . Esophagitis 08/27/2013  . H/O gastrointestinal hemorrhage 08/22/2013  . H/O gastric ulcer 08/04/2013  . Disorder of nutrition 08/11/2013  . Headache, migraine 08/25/2013  . Calculus of kidney 08/09/2013  . OP  (osteoporosis) 08/22/2013    Orientation RESPIRATION BLADDER Height & Weight     Self  Normal Incontinent Weight: 72 lb (32.7 kg) Height:  5\' 3"  (160 cm)  BEHAVIORAL SYMPTOMS/MOOD NEUROLOGICAL BOWEL NUTRITION STATUS     (NONE) Continent Diet (regular diet; soft foods; thin fluid consistency)  AMBULATORY STATUS COMMUNICATION OF NEEDS Skin   Extensive Assist Verbally Normal                       Personal Care Assistance Level of Assistance  Bathing, Feeding, Dressing Bathing Assistance: Limited assistance Feeding assistance: Independent Dressing Assistance: Limited assistance     Functional Limitations Info  Hearing, Speech, Sight Sight Info: Adequate Hearing Info: Adequate Speech Info: Adequate    SPECIAL CARE FACTORS FREQUENCY  PT (By licensed PT)     PT Frequency: MIN 5X/WEEK               Contractures Contractures Info: Not present    Additional Factors Info  Code Status, Allergies, Psychotropic Code Status Info: Not on File Allergies Info: Penicillins, Aspirin, Ferrous Gluconate, Iron Polysaccharide, Nsaids, Other, Sulfamethoxazole-trimethoprim, Venofer Iron Sucrose Psychotropic Info: Prozac         Current Medications (12/27/2016):  This is the current hospital active medication list Current Facility-Administered Medications  Medication Dose Route Frequency Provider Last Rate Last Dose  . amitriptyline (ELAVIL) tablet 50 mg  50 mg Oral QHS Clapacs, Jackquline Denmark, MD   50 mg at 12/26/16 2049  . FLUoxetine (PROZAC) capsule 30 mg  30 mg Oral Daily Clapacs, John T, MD   30 mg at 12/27/16 0905  . furosemide (LASIX) tablet 20 mg  20 mg Oral Daily  Clapacs, Jackquline DenmarkJohn T, MD   20 mg at 12/27/16 [id number]  . latanoprost (XALATAN) 0.005 % ophthalmic solution 1 drop  1 drop Both Eyes QHS Clapacs, Jackquline DenmarkJohn T, MD   1 drop at 12/26/16 2041  . metoprolol tartrate (LOPRESSOR) tablet 25 mg  25 mg Oral BID Clapacs, Jackquline DenmarkJohn T, MD   Stopped at 12/27/16 [phone]  . pantoprazole (PROTONIX) EC tablet 40  mg  40 mg Oral Daily Clapacs, Jackquline DenmarkJohn T, MD   40 mg at 12/27/16 0905  . SUMAtriptan (IMITREX) tablet 100 mg  100 mg Oral Q2H PRN Clapacs, Jackquline DenmarkJohn T, MD   100 mg at 12/25/16 1145  . traMADol (ULTRAM) tablet 50 mg  50 mg Oral Q6H PRN Sharman CheekStafford, Phillip, MD   50 mg at 12/26/16 [id number]   Current Outpatient Prescriptions  Medication Sig Dispense Refill  . amitriptyline (ELAVIL) 50 MG tablet Take 50 mg by mouth at bedtime.     . calcium-vitamin D (OSCAL WITH D) 500-200 MG-UNIT tablet Take 1 tablet by mouth daily with breakfast.    . FLUoxetine (PROZAC) 10 MG capsule Take 10 mg by mouth daily.     . furosemide (LASIX) 20 MG tablet Take 20 mg by mouth daily.    Marland Kitchen. latanoprost (XALATAN) 0.005 % ophthalmic solution Place 1 drop into both eyes at bedtime.     . metoprolol tartrate (LOPRESSOR) 25 MG tablet Take 12.5 mg by mouth 2 (two) times daily.     . Multiple Vitamin (MULTIVITAMIN) tablet Take 1 tablet by mouth daily.    . ondansetron (ZOFRAN-ODT) 4 MG disintegrating tablet 4 mg every 8 (eight) hours as needed.     . pantoprazole (PROTONIX) 40 MG tablet Take 40 mg by mouth daily.     . SUMAtriptan (IMITREX) 100 MG tablet 100 mg every 2 (two) hours as needed.     . tamsulosin (FLOMAX) 0.4 MG CAPS capsule Take 0.4 mg by mouth daily.       Discharge Medications: Please see discharge summary for a list of discharge medications.  Relevant Imaging Results:  Relevant Lab Results:   Additional Information SS # [id number]  [name], LCSW

## 2016-12-27 NOTE — ED Notes (Signed)
Awaken patient offered her to go to bedside patient states NO patient dry at this time
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
## 2016-12-27 NOTE — ED Notes (Signed)
Nurse Kara MeadEmma in room with patient at this time

## 2016-12-27 NOTE — ED Notes (Signed)
PT VOLUNTARY PENDING PLACEMENT. 

## 2016-12-27 NOTE — ED Notes (Signed)
Soiled brief changed at this time. Patient assisted to bedside commode.

## 2016-12-27 NOTE — Progress Notes (Addendum)
CSW received consult for Nursing Home Placement. CSW consulted with TTS to confirm discharge plan. Patient is still being recommended for Geri Psych placement at this time, however, family would like to look at short term rehab as back up due to physical decompensation while Patient has been in the ED. CSW engaged with Patient's husband Ashley Young [phone]) via T/C. Patient's husband requesting that a [facility] be [facility]. CSW informed Patient's husband that TTS is aware of request. Patient's husband inquired about nursing home placement for Patient vs. Geri-psych inpatient hospital placement. CSW discussed the process of nursing home placement referrals. CSW also discussed with Patient's husband's the barriers to immediate Nursing Home Placement including awaiting a level 2 PASRR due to dementia/behavioral concerns. Patient's husband expressed understanding. CSW has completed FL-2 to be faxed out to local SNF facilities once Patient is psychiatrically cleared. CSW emphasized to Patient's husband that if Patient is declined by local facilities, CSW will have to expand bed search. Patient's husband expressed understanding. CSW has requested PASRR number and it is under review. CSW has also contacted bedside RN for PT consult. CSW continues to follow.    Ashley Young, MSW, LCSW [facility] Clinical Social Worker [phone]

## 2016-12-27 NOTE — ED Notes (Signed)
Pt has received a call from the pts husband. Plan of care has been discussed.

## 2016-12-27 NOTE — ED Notes (Signed)
Patients daughter, Pagie, requesting stronger pain regimen. MD notified.

## 2016-12-27 NOTE — ED Notes (Signed)
While assisting patient with taking medication patient states, "shut your God damn mouth. I can't stand your guts. I'm not playing your games today". MD notified and aware.

## 2016-12-27 NOTE — Progress Notes (Signed)
Family Meeting Note  Advance Directive:no  Today a meeting took place with the Family.  Patient is unable to participate due MV:HQIONGto:Lacked capacity Dementia and severe depression   The following clinical team members were present during this meeting:MD  The following were discussed:Patient's diagnosis: Severe depression hypokalemia Dementia Adult failure to thrive Protein time malnutrition  , Patient's progosis: Unable to determine and Goals for treatment: DNR  Additional follow-up to be provided: Palliative care consult  Time spent during discussion: 18 minutes  Waynesha Rammel, MD

## 2016-12-27 NOTE — ED Provider Notes (Signed)
-----------------------------------------   3:53 PM on 12/27/2016 -----------------------------------------  Patient not eating or drinking very well here, we are rechecking BUN and creatinine we have talked to the hospitalist about admission as it doesn't seem outpatient placement is likely to happen. We have also consulted Ms. Yong ChannelAlicia Parker of palliative care service to come address the patient's goals and expectations and possible outcomes. Family are aware. We have also discussed with Dr. Juliene PinaMody of the hospitalist service who likely will be able to take the patient onto her service. Dr. of psychiatry, Dr. Delaney Meigslaypacs, is also been integrally involved in her care and discussing with the family. We have encouraged by mouth hydration but if that does not work we will give her IV fluids   Jeanmarie PlantMcShane, Walda Hertzog A, MD 12/27/16 1555

## 2016-12-27 NOTE — ED Notes (Signed)
Pt has received a call from the pts son Rhae HammockMark Bille. Plan of Care has been discussed. Pts son has requested that [facility]. TTS has contacted [facility] [phone]( [phone]) to inquired about referral process. Per [facility] staff referrals can only be placed after an available bed is verified by the reviewing nurse. Staff have reported that there are no beds available at this tim and that there are no anticipated discharges. TTS to call on 12/28/16 to inquired about bed availability and d/c.

## 2016-12-27 NOTE — ED Notes (Addendum)
Patient's spouse and daughter approached First Nurse in lobby to request to speak with EDP.  Discussed situation with Primary Nurse, Kara MeadEmma, RN, and Charge Nurse, Gearldine BienenstockBrandy.  confirmed that Dr. Shary Keylapac's had spoken with family at length and plan to involve EDP and Hosptialist to look at medical admission for patient decided upon.  Reinforced immediate plan of care with patient's family, and will coordinate family discussion with Hosptialist during initial assessment.  Understanding verbalized.

## 2016-12-27 NOTE — ED Notes (Signed)
Patient offered lunch at this time. Patient refuses. Patient educated on the importance of eating and drinking. Patient states, "Just leave me alone". Patient took a few sips of water.

## 2016-12-27 NOTE — ED Notes (Signed)
Dr. Mody at bedside.  

## 2016-12-27 NOTE — H&P (Signed)
[employer] [facility] [place] [facility]   PATIENT NAME: Ashley Young    MR#:  [id number]  DATE OF BIRTH:  1941-06-21  DATE OF ADMISSION:  12/24/2016  PRIMARY CARE PHYSICIAN: Marisue Ivan, MD   REQUESTING/REFERRING PHYSICIAN: dr Alphonzo Lemmings  CHIEF COMPLAINT:   hypokalemia HISTORY OF PRESENT ILLNESS:  Ashley Young  is a 76 y.o. female with a known history of severe MDD/dementia who Has been in the emergency room since May 29 for severe depression. Plan was for patient to be transferred to geriatric psychiatry facility. She has been evaluated daily by psychiatry. She is being admitted today due to poor by mouth intake and severe hypokalemia.  She herself cannot provide history of present illness. She think she is at a nursing home. She is complaining of the food she has been given which is a sandwich and a banana. PAST MEDICAL HISTORY:   Past Medical History:  Diagnosis Date  . Anemia   . Arthritis   . B12 deficiency   . Blood transfusion without reported diagnosis   . CHF (congestive heart failure) (HCC)   . Depression   . Dysrhythmia    TACHYCARDIA  . Edema    MILD OF ANKLES  . Failure to thrive (0-17)   . GERD (gastroesophageal reflux disease)   . Headache    MIGRAINES  . Hypercholesterolemia   . IDA (iron deficiency anemia)   . Microcytic anemia   . Migraine   . Multiple gastric ulcers   . Renal disorder    Multiple episodes of kidney stones.     PAST SURGICAL HISTORY:   Past Surgical History:  Procedure Laterality Date  . ABDOMINAL SURGERY     Reconstructed stomach and 3 different surgeries.   . APPENDECTOMY    . BREAST SURGERY     cyst removal  . CATARACT EXTRACTION W/PHACO Right 06/05/2015   Procedure: CATARACT EXTRACTION PHACO AND INTRAOCULAR LENS PLACEMENT (IOC);  Surgeon: Lockie Mola, MD;  Location: [facility] ORS;  Service: Ophthalmology;  Laterality: Right;  [place] 01:06   . CESAREAN SECTION     x 3  . CHOLECYSTECTOMY    .  COLONOSCOPY    . ESOPHAGOGASTRODUODENOSCOPY    . FRACTURE SURGERY     HIP  . KYPHOPLASTY      SOCIAL HISTORY:   Social History  Substance Use Topics  . Smoking status: Former Smoker    Types: Cigarettes    Quit date: 11/29/1993  . Smokeless tobacco: Never Used  . Alcohol use 0.6 oz/week    1 Standard drinks or equivalent per week    FAMILY HISTORY:  Lung and breast cancer  DRUG ALLERGIES:   Allergies  Allergen Reactions  . Penicillins Swelling and Rash  . Aspirin Other (See Comments)    GI bleed Stomach ulcers, bleeding   . Ferrous Gluconate Nausea Only    Cannot take in IV form  . Iron Polysaccharide Rash  . Nsaids Rash    Severe GI upset and prior ulcer  . Other Rash    Venofen- unknown  . Sulfamethoxazole-Trimethoprim Rash  . Venofer [Iron Sucrose] Rash    REVIEW OF SYSTEMS:   Review of Systems  Constitutional: Positive for malaise/fatigue and weight loss. Negative for chills and fever.  HENT: Negative.  Negative for ear discharge, ear pain, hearing loss, nosebleeds and sore throat.   Eyes: Negative.  Negative for blurred vision and pain.  Respiratory: Negative.  Negative for cough, hemoptysis, shortness of breath and wheezing.  Cardiovascular: Negative.  Negative for chest pain, palpitations and leg swelling.  Gastrointestinal: Negative.  Negative for abdominal pain, blood in stool, diarrhea, nausea and vomiting.  Genitourinary: Negative.  Negative for dysuria.  Musculoskeletal: Negative.  Negative for back pain.  Skin: Negative.   Neurological: Positive for weakness. Negative for dizziness, tremors, speech change, focal weakness, seizures and headaches.  Endo/Heme/Allergies: Negative.  Does not bruise/bleed easily.  Psychiatric/Behavioral: Positive for depression. Negative for hallucinations and suicidal ideas.    MEDICATIONS AT HOME:   Prior to Admission medications   Medication Sig Start Date End Date Taking? Authorizing Provider  amitriptyline  (ELAVIL) 50 MG tablet Take 50 mg by mouth at bedtime.  11/07/14  Yes [provider]  calcium-vitamin D (OSCAL WITH D) 500-200 MG-UNIT tablet Take 1 tablet by mouth daily with breakfast.   Yes [provider]  FLUoxetine (PROZAC) 10 MG capsule Take 10 mg by mouth daily.  12/12/15  Yes [provider]  furosemide (LASIX) 20 MG tablet Take 20 mg by mouth daily.   Yes [provider]  latanoprost (XALATAN) 0.005 % ophthalmic solution Place 1 drop into both eyes at bedtime.  11/18/14  Yes [provider]  metoprolol tartrate (LOPRESSOR) 25 MG tablet Take 12.5 mg by mouth 2 (two) times daily.  10/10/15 12/24/16 Yes [provider]  Multiple Vitamin (MULTIVITAMIN) tablet Take 1 tablet by mouth daily.   Yes [provider]  ondansetron (ZOFRAN-ODT) 4 MG disintegrating tablet 4 mg every 8 (eight) hours as needed.  10/06/14  Yes [provider]  pantoprazole (PROTONIX) 40 MG tablet Take 40 mg by mouth daily.  09/08/14  Yes [provider]  SUMAtriptan (IMITREX) 100 MG tablet 100 mg every 2 (two) hours as needed.  12/30/14  Yes [provider]  tamsulosin (FLOMAX) 0.4 MG CAPS capsule Take 0.4 mg by mouth daily.   Yes [provider]      VITAL SIGNS:  Blood pressure 106/65, pulse 84, temperature 98.2 F (36.8 C), temperature source Oral, resp. rate 16, height 5\' 3"  (1.6 m), weight 32.7 kg (72 lb), SpO2 95 %.  PHYSICAL EXAMINATION:   Physical Exam  Constitutional: She is well-developed, well-nourished, and in no distress. No distress.  Frail ill appearing thin   HENT:  Head: Normocephalic.  Eyes: No scleral icterus.  Neck: Normal range of motion. Neck supple. No JVD present. No tracheal deviation present.  Cardiovascular: Normal rate, regular rhythm and normal heart sounds.  Exam reveals no gallop and no friction rub.   No murmur heard. Pulmonary/Chest: Effort normal and breath sounds normal. No respiratory  distress. She has no wheezes. She has no rales. She exhibits no tenderness.  Abdominal: Soft. Bowel sounds are normal. She exhibits no distension and no mass. There is no tenderness. There is no rebound and no guarding.  Musculoskeletal: Normal range of motion. She exhibits no edema.  Neurological: She is alert.  Oriented to name only  Skin: Skin is warm. No rash noted. No erythema.  Psychiatric:  Flat affect Poor judgment      LABORATORY PANEL:   CBC  Recent Labs Lab 12/24/16 1441  WBC 8.2  HGB 12.1  HCT 36.7  PLT 326   ------------------------------------------------------------------------------------------------------------------  Chemistries   Recent Labs Lab 12/27/16 1537  NA 138  K 2.5*  CL 101  CO2 29  GLUCOSE 94  BUN 11  CREATININE 0.54  CALCIUM 8.0*  AST 21  ALT 21  ALKPHOS 113  BILITOT 0.6   ------------------------------------------------------------------------------------------------------------------  Cardiac Enzymes No results for input(s): TROPONINI in the last 168 hours. ------------------------------------------------------------------------------------------------------------------  RADIOLOGY:  Dg Pelvis 1-2 Views  Result Date: 12/26/2016 CLINICAL DATA:  Bilateral hip pain EXAM: PELVIS - 1-2 VIEW COMPARISON:  01/12/2016 FINDINGS: Bones are diffusely demineralized. Patient is status post sacral plasty. Age-indeterminate fractures right superior inferior pubic rami evident. Three cannulated compression screws transfix a right femoral neck fracture, similar to prior. IMPRESSION: Osteopenia with age indeterminate right superior and inferior pubic rami fractures. Electronically Signed   By: Kennith CenterEric  Mansell M.D.   On: 12/26/2016 17:17    EKG:   Orders placed or performed during the hospital encounter of 12/24/16  . ED EKG  . ED EKG  . EKG 12-Lead  . EKG 12-Lead    IMPRESSION AND PLAN:   76 year old female with severe recurrent MDD  without psychotic features who has been emergency department for several days awaiting geriatric psychiatry admission now being admitted for severe hypokalemia and adult failure to thrive.  1. Severe hypokalemia: I will check phosphorus and magnesium level Replace electrolytes as needed BMP in a.m. Continue cardiac monitoring   2. Adult failure to thrive with protein calorie malnutrition: Palliative care consult Dietary consultation for calorie count and feeding supplement  3. Severe MDD: Psychiatry consult Continue Prozac, Abilify, Remeron and amitriptyline  4. Combined systolic and diastolic heart failure: No signs of exacerbation Holding Lasix due to hypokalemia Monitor fluid status on IV fluids.   5. Urinary retention: Continue tamsulosin  6 GERD: Continue PPI   PT and CSW consulted as well for dispo   All the records are reviewed and case discussed with ED provider. Management plans discussed with the patient and family and they are in agreement.  CODE STATUS: dnr TOTAL TIME TAKING CARE OF THIS PATIENT: 45 minutes.    Marrisa Kimber M.D on 12/27/2016 at 4:21 PM  Between 7am to 6pm - Pager - [phone]  After 6pm go to [email or web address] - Social research officer, governmentpassword EPAS [facility]  [facility] [place] [facility]  Office  [phone]  CC: Primary care physician; Marisue IvanLinthavong, [place], MD

## 2016-12-27 NOTE — ED Notes (Signed)
Patient assisted to bedside commode. Patient requires maximum assistance, 2+ staff member to assist. Soiled brief changed. Warm blanket given to patient.

## 2016-12-27 NOTE — ED Notes (Signed)
Pt has been occasionally yelling at an unseen individual. Pt rests after these moments and in not combative with staff or trying to get out of bed.

## 2016-12-27 NOTE — ED Notes (Signed)
Patients husband at bedside. He states, "She needs IV right away. She needs a room upstairs. I understand this is only your job and that you get to go home and enjoy your weekend but for me this is it". Family reassured that we are working hard to find patient placement and that EDP has been updated on patients status. Dr. Sherlynn Carbonlappas speaking with patients husband at this time.
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
## 2016-12-27 NOTE — ED Notes (Signed)
Pt repositioned in bed. Warm blankets given at this time.

## 2016-12-27 NOTE — ED Notes (Signed)
Pt found with light amount of urine on depends. Pt changed and dry at this time.

## 2016-12-27 NOTE — ED Notes (Signed)
Dr.clapaac in room talking with patient 

## 2016-12-27 NOTE — ED Notes (Signed)
Dr.Clapacs at bedside  

## 2016-12-27 NOTE — ED Notes (Signed)
Dr. Juliene PinaMody speaking with family at this time.

## 2016-12-27 NOTE — Progress Notes (Addendum)
MEDICATION RELATED CONSULT NOTE  Pharmacy Consult for electrolyte monitoring and dosing Indication: hypokalemia  Assessment: Pt is [age] yoF with hx of sever MDD/dementia and has been in ER since 5/29 for severe depression. Plan was to be transferred to geriatric psych facility but pt now being admitted due to poor PO intake and severe hypokalemia. Pt has 20 mEq KCl running in 0.9% NaCl at 75 mL/hr  6/1 1537 K = 2.5; Mag = 1.6  Goal of Therapy:  Electrolytes WNL  Plan:  Give KCl 10 mEq IV x 4 and check K one hour after last infusion Give mag 2 g IV x 1 and check with am labs   Allergies  Allergen Reactions  . Penicillins Swelling and Rash  . Aspirin Other (See Comments)    GI bleed Stomach ulcers, bleeding   . Ferrous Gluconate Nausea Only    Cannot take in IV form  . Iron Polysaccharide Rash  . Nsaids Rash    Severe GI upset and prior ulcer  . Other Rash    Venofen- unknown  . Sulfamethoxazole-Trimethoprim Rash  . Venofer [Iron Sucrose] Rash    Patient Measurements: Height: 5\' 3"  (160 cm) Weight: 72 lb (32.7 kg) IBW/kg (Calculated) : 52.4  Vital Signs: Temp: 98.1 F (36.7 C) (06/01 1838) Temp Source: Oral (06/01 1838) BP: 100/50 (06/01 1838) Pulse Rate: 97 (06/01 1838) Intake/Output from previous day: No intake/output data recorded. Intake/Output from this shift: No intake/output data recorded.  Labs:  Recent Labs  12/27/16 1537  CREATININE 0.54  ALBUMIN 2.4*  PROT 5.4*  AST 21  ALT 21  ALKPHOS 113  BILITOT 0.6   Estimated Creatinine Clearance: 30.4 mL/min (by C-G formula based on SCr of 0.54 mg/dL).   Microbiology: Recent Results (from the past 720 hour(s))  Urine culture     Status: None   Collection Time: 12/24/16  4:15 PM  Result Value Ref Range Status   Specimen Description URINE, RANDOM  Final   Special Requests NONE  Final   Culture   Final    NO GROWTH Performed at [facility], [address] [place], [place] [address]    Report Status 12/25/2016 FINAL  Final    Medical History: Past Medical History:  Diagnosis Date  . Anemia   . Arthritis   . B12 deficiency   . Blood transfusion without reported diagnosis   . CHF (congestive heart failure) (HCC)   . Depression   . Dysrhythmia    TACHYCARDIA  . Edema    MILD OF ANKLES  . Failure to thrive (0-17)   . GERD (gastroesophageal reflux disease)   . Headache    MIGRAINES  . Hypercholesterolemia   . IDA (iron deficiency anemia)   . Microcytic anemia   . Migraine   . Multiple gastric ulcers   . Renal disorder    Multiple episodes of kidney stones.     Medications:  Scheduled:  . amitriptyline  50 mg Oral QHS  . ARIPiprazole  2 mg Oral Daily  . [START ON 12/28/2016] calcium-vitamin D  1 tablet Oral Q breakfast  . enoxaparin (LOVENOX) injection  40 mg Subcutaneous Q24H  . FLUoxetine  30 mg Oral Daily  . latanoprost  1 drop Both Eyes QHS  . mirtazapine  7.5 mg Oral QHS  . multivitamin with minerals  1 tablet Oral Daily  . pantoprazole  40 mg Oral Daily  . tamsulosin  0.4 mg Oral Daily   Infusions:  . 0.9 %  NaCl with KCl 20 mEq / L    . potassium chloride      Horris Latino, PharmD Pharmacy Resident 12/27/2016 7:04 PM

## 2016-12-27 NOTE — ED Notes (Signed)
Spoke with patients daughter, Shela Nevinagie and gave her an update in regards to patients mental state and plan of care.

## 2016-12-27 NOTE — ED Notes (Signed)
Patient encouraged to eat. Patient given coke to drink and a [place] sandwich tray.

## 2016-12-27 NOTE — ED Notes (Signed)
Patient encouraged to eat breakfast. Patient refused. Patient did drink 1 cup of water.

## 2016-12-28 ENCOUNTER — Inpatient Hospital Stay: Payer: Medicare Other

## 2016-12-28 LAB — URINALYSIS, COMPLETE (UACMP) WITH MICROSCOPIC
BACTERIA UA: NONE SEEN
BILIRUBIN URINE: NEGATIVE
Glucose, UA: NEGATIVE mg/dL
Hgb urine dipstick: NEGATIVE
KETONES UR: NEGATIVE mg/dL
Leukocytes, UA: NEGATIVE
Nitrite: NEGATIVE
PROTEIN: NEGATIVE mg/dL
Specific Gravity, Urine: 1.012 (ref 1.005–1.030)
pH: 5 (ref 5.0–8.0)

## 2016-12-28 LAB — BASIC METABOLIC PANEL
Anion gap: 7 (ref 5–15)
BUN: 11 mg/dL (ref 6–20)
CHLORIDE: 104 mmol/L (ref 101–111)
CO2: 26 mmol/L (ref 22–32)
Calcium: 7.7 mg/dL — ABNORMAL LOW (ref 8.9–10.3)
Creatinine, Ser: 0.45 mg/dL (ref 0.44–1.00)
GFR calc Af Amer: >60 mL/min (ref >60)
GLUCOSE: 82 mg/dL (ref 65–99)
POTASSIUM: 3.2 mmol/L — AB (ref 3.5–5.1)
Sodium: 137 mmol/L (ref 135–145)

## 2016-12-28 LAB — CBC
HEMATOCRIT: 33.6 % — AB (ref 35.0–47.0)
Hemoglobin: 11.2 g/dL — ABNORMAL LOW (ref 12.0–16.0)
MCH: 29 pg (ref 26.0–34.0)
MCHC: 33.2 g/dL (ref 32.0–36.0)
MCV: 87.2 fL (ref 80.0–100.0)
Platelets: 226 10*3/uL (ref 150–440)
RBC: 3.86 MIL/uL (ref 3.80–5.20)
RDW: 15.2 % — AB (ref 11.5–14.5)
WBC: 6.3 10*3/uL (ref 3.6–11.0)

## 2016-12-28 LAB — PHOSPHORUS: PHOSPHORUS: 2.6 mg/dL (ref 2.5–4.6)

## 2016-12-28 LAB — MAGNESIUM: Magnesium: 2.2 mg/dL (ref 1.7–2.4)

## 2016-12-28 MED ORDER — POTASSIUM CHLORIDE 20 MEQ PO PACK: 40.0000 meq | PACK | Freq: Once | ORAL | Status: DC

## 2016-12-28 MED ORDER — POTASSIUM CHLORIDE CRYS ER 20 MEQ PO TBCR
40.0000 meq | EXTENDED_RELEASE_TABLET | Freq: Two times a day (BID) | ORAL | Status: AC
  Administered 2016-12-28 (×2): 40 meq via ORAL
  Filled 2016-12-28 (×2): qty 2

## 2016-12-28 MED ORDER — POTASSIUM CHLORIDE 10 MEQ/100ML IV SOLN
10.0000 meq | INTRAVENOUS | Status: AC
  Administered 2016-12-28 (×2): 10 meq via INTRAVENOUS
  Filled 2016-12-28: qty 100

## 2016-12-28 MED ORDER — KETOROLAC TROMETHAMINE 30 MG/ML IJ SOLN
30.0000 mg | Freq: Four times a day (QID) | INTRAMUSCULAR | Status: AC
  Administered 2016-12-28 – 2016-12-29 (×3): 30 mg via INTRAVENOUS
  Filled 2016-12-28 (×3): qty 1

## 2016-12-28 MED ORDER — POTASSIUM CHLORIDE CRYS ER 20 MEQ PO TBCR
40.0000 meq | EXTENDED_RELEASE_TABLET | Freq: Once | ORAL | Status: AC
  Administered 2016-12-28: 40 meq via ORAL
  Filled 2016-12-28: qty 2

## 2016-12-28 MED ORDER — ENOXAPARIN SODIUM 30 MG/0.3ML ~~LOC~~ SOLN
30.0000 mg | SUBCUTANEOUS | Status: DC
  Administered 2016-12-28: 30 mg via SUBCUTANEOUS
  Filled 2016-12-28: qty 0.3

## 2016-12-28 NOTE — Progress Notes (Addendum)
[employer] - [place] at [facility]                                                                                                                                                                                  Patient Demographics   Ashley Young, is a 76 y.o. female, DOB - 1940/12/18, ZOX:[id number]  Admit date - 12/24/2016   Admitting Physician Adrian Saran, MD  Outpatient Primary MD for the patient is Marisue Ivan, MD   LOS - 1  Subjective: Patient complains of severe pain in the left hip pelvic region apparently she had a fall yesterday x-ray was negative    Review of Systems:   CONSTITUTIONAL: No documented fever. No fatigue, weakness. No weight gain, no weight loss.  EYES: No blurry or double vision.  ENT: No tinnitus. No postnasal drip. No redness of the oropharynx.  RESPIRATORY: No cough, no wheeze, no hemoptysis. No dyspnea.  CARDIOVASCULAR: No chest pain. No orthopnea. No palpitations. No syncope.  GASTROINTESTINAL: No nausea, no vomiting or diarrhea. No abdominal pain. No melena or hematochezia.  GENITOURINARY: No dysuria or hematuria.  ENDOCRINE: No polyuria or nocturia. No heat or cold intolerance.  HEMATOLOGY: No anemia. No bruising. No bleeding.  INTEGUMENTARY: No rashes. No lesions.  MUSCULOSKELETAL: No arthritis. No swelling. No gout. Left hip/pelvic region pain NEUROLOGIC: No numbness, tingling, or ataxia. No seizure-type activity.  PSYCHIATRIC: No anxiety. No insomnia. No ADD.    Vitals:   Vitals:   12/28/16 0003 12/28/16 0500 12/28/16 0726 12/28/16 1339  BP: 105/60  (!) 104/46 (!) 91/49  Pulse: (!) 107  92 (!) 114  Resp: 20  18 18   Temp: 97.8 F (36.6 C)  98.4 F (36.9 C) 99.1 F (37.3 C)  TempSrc: Oral  Oral Oral  SpO2: 97%  92% 97%  Weight:  87 lb 1.6 oz (39.5 kg)    Height:        Wt Readings from Last 3 Encounters:  12/28/16 87 lb 1.6 oz (39.5 kg)  05/17/16 96 lb 4 oz (43.7 kg)  01/12/16 102 lb (46.3 kg)      Intake/Output Summary (Last 24 hours) at 12/28/16 1425 Last data filed at 12/28/16 1200  Gross per 24 hour  Intake          1128.75 ml  Output                0 ml  Net          1128.75 ml    Physical Exam:   GENERAL: Pleasant-appearing in no apparent distress.  HEAD, EYES, EARS, NOSE AND THROAT: Atraumatic, normocephalic. Extraocular muscles are intact. Pupils equal and reactive to  light. Sclerae anicteric. No conjunctival injection. No oro-pharyngeal erythema.  NECK: Supple. There is no jugular venous distention. No bruits, no lymphadenopathy, no thyromegaly.  HEART: Regular rate and rhythm,. No murmurs, no rubs, no clicks.  LUNGS: Clear to auscultation bilaterally. No rales or rhonchi. No wheezes.  ABDOMEN: Soft, flat, nontender, nondistended. Has good bowel sounds. No hepatosplenomegaly appreciated.  EXTREMITIES: No evidence of any cyanosis, clubbing, or peripheral edema.  +2 pedal and radial pulses bilaterally.  NEUROLOGIC: The patient is alert, awake, and oriented x3 with no focal motor or sensory deficits appreciated bilaterally.  SKIN: Moist and warm with no rashes appreciated.  Psych: Not anxious, depressed LN: No inguinal LN enlargement    Antibiotics   Anti-infectives    None      Medications   Scheduled Meds: . amitriptyline  50 mg Oral QHS  . ARIPiprazole  2 mg Oral Daily  . calcium-vitamin D  1 tablet Oral Q breakfast  . enoxaparin (LOVENOX) injection  30 mg Subcutaneous Q24H  . FLUoxetine  30 mg Oral Daily  . ketorolac  30 mg Intravenous Q6H  . latanoprost  1 drop Both Eyes QHS  . mirtazapine  7.5 mg Oral QHS  . multivitamin with minerals  1 tablet Oral Daily  . pantoprazole  40 mg Oral Daily  . potassium chloride  40 mEq Oral Once  . potassium chloride  40 mEq Oral BID  . tamsulosin  0.4 mg Oral Daily   Continuous Infusions: . 0.9 % NaCl with KCl 20 mEq / L 75 mL/hr at 12/27/16 1945   PRN Meds:.acetaminophen **OR** acetaminophen, bisacodyl,  ondansetron **OR** ondansetron (ZOFRAN) IV, oxyCODONE, senna-docusate, SUMAtriptan, traMADol   Data Review:   Micro Results Recent Results (from the past 240 hour(s))  Urine culture     Status: None   Collection Time: 12/24/16  4:15 PM  Result Value Ref Range Status   Specimen Description URINE, RANDOM  Final   Special Requests NONE  Final   Culture   Final    NO GROWTH Performed at [facility], [address] [place], [place] [address]    Report Status 12/25/2016 FINAL  Final    Radiology Reports Dg Pelvis 1-2 Views  Result Date: 12/26/2016 CLINICAL DATA:  Bilateral hip pain EXAM: PELVIS - 1-2 VIEW COMPARISON:  01/12/2016 FINDINGS: Bones are diffusely demineralized. Patient is status post sacral plasty. Age-indeterminate fractures right superior inferior pubic rami evident. Three cannulated compression screws transfix a right femoral neck fracture, similar to prior. IMPRESSION: Osteopenia with age indeterminate right superior and inferior pubic rami fractures. Electronically Signed   By: Kennith CenterEric  Mansell M.D.   On: 12/26/2016 17:17   Dg Chest Portable 1 View  Result Date: 12/24/2016 CLINICAL DATA:  76 y/o  F; agitation. EXAM: PORTABLE CHEST 1 VIEW COMPARISON:  11/09/2014 chest radiograph FINDINGS: Stable borderline cardiomegaly. Aortic atherosclerosis with calcification. Surgical clips project over gastroesophageal junction. Stable hyperinflated lungs. Stable chronic bronchitic changes in the lung bases. No new focal consolidation, effusion, or pneumothorax. Bones are unremarkable. IMPRESSION: No active disease. Electronically Signed   By: Mitzi HansenLance  Furusawa-Stratton M.D.   On: 12/24/2016 16:33     CBC  Recent Labs Lab 12/24/16 1441 12/27/16 1859 12/28/16 0703  WBC 8.2 6.6 6.3  HGB 12.1 10.8* 11.2*  HCT 36.7 33.4* 33.6*  PLT 326 259 226  MCV 85.9 86.7 87.2  MCH 28.2 28.1 29.0  MCHC 32.8 32.5 33.2  RDW 15.6* 15.2* 15.2*    Chemistries  Recent Labs Lab  12/24/16 1441 12/27/16 1537 12/27/16 1859 12/28/16 0703  NA 135 138  --  137  K 3.7 2.5*  --  3.2*  CL 106 101  --  104  CO2 22 29  --  26  GLUCOSE 94 94  --  82  BUN 16 11  --  11  CREATININE 0.69 0.54 0.60 0.45  CALCIUM 8.4* 8.0*  --  7.7*  MG  --   --  1.6* 2.2  AST 25 21  --   --   ALT 16 21  --   --   ALKPHOS 134* 113  --   --   BILITOT 0.3 0.6  --   --    ------------------------------------------------------------------------------------------------------------------ estimated creatinine clearance is 36.7 mL/min (by C-G formula based on SCr of 0.45 mg/dL). ------------------------------------------------------------------------------------------------------------------ No results for input(s): HGBA1C in the last 72 hours. ------------------------------------------------------------------------------------------------------------------ No results for input(s): CHOL, HDL, LDLCALC, TRIG, CHOLHDL, LDLDIRECT in the last 72 hours. ------------------------------------------------------------------------------------------------------------------ No results for input(s): TSH, T4TOTAL, T3FREE, THYROIDAB in the last 72 hours.  Invalid input(s): FREET3 ------------------------------------------------------------------------------------------------------------------ No results for input(s): VITAMINB12, FOLATE, FERRITIN, TIBC, IRON, RETICCTPCT in the last 72 hours.  Coagulation profile No results for input(s): INR, PROTIME in the last 168 hours.  No results for input(s): DDIMER in the last 72 hours.  Cardiac Enzymes No results for input(s): CKMB, TROPONINI, MYOGLOBIN in the last 168 hours.  Invalid input(s): CK ------------------------------------------------------------------------------------------------------------------ Invalid input(s): POCBNP    Assessment & Plan  Patient is 76 year old who was actually in the emergency room waiting placement to geriatric psychiatric  unit She was noted to have severe hypokalemia and electrolyte disturbances  1. Severe hypokalemia: continue to replace her potassium Magnesium is replaced discontinue telemetry   2. Adult failure to thrive with protein calorie malnutrition: Palliative care consult Dietary consultation for calorie count and feeding supplement  3. Severe MDD: Psychiatry consult Continue Prozac, Abilify, Remeron and amitriptyline  4. Combined systolic and diastolic heart failure: No signs of exacerbation Holding Lasix due to hypokalemia Monitor fluid status on IV fluids.   5. Urinary retention: Continue tamsulosin  6 GERD: Continue PPI  7. Hip pain obtain ct of pelvic area     Code Status Orders        Start     Ordered   12/27/16 1831  Full code  Continuous     12/27/16 1831    Code Status History    Date Active Date Inactive Code Status Order ID Comments User Context   12/27/2016  4:36 PM 12/27/2016  6:31 PM DNR [id number]  Adrian Saran, MD ED           Consults  pyschiatry  DVT Prophylaxis  Lovenox    Lab Results  Component Value Date   PLT 226 12/28/2016     Time Spent in minutes  Greater than 50% of time spent in care coordination and counseling patient regarding the condition and plan of care.   Auburn Bilberry M.D on 12/28/2016 at 2:25 PM  Between 7am to 6pm - Pager - [phone]  After 6pm go to [email or web address] - password [email or web address] [facility]  [facility]   Office  [phone]

## 2016-12-28 NOTE — Clinical Social Work Note (Addendum)
CSW received consult that patient may require SNF placement. CSW will follow pending PT recommendations. The patient will need to be cleared by psych prior to any discharge to a SNF, and her PASRR requires a manual review. According [facility] the ED CSW[facility], and the palliative care consult is pending review on Monday. Considering the failure to thrive/lack of PO foods, the patient may require hospice services.  CSW was addressed by the PT attending to this patient with the patient's spouse to ask specific questions about dc planning. The CSW advised the spouse and the physical therapist that the patient's PASRR is pending and no advances for discharge to a SNF could proceed until 1) a manual review for PASRR has occurred 2) the patient becomes mobile enough to become eligible for a transfer to geripsych. The CSW also advised the patient's spouse and the physical therapist that issues of delirium, depression, and dementia are being followed by psychiatry.   According to the ED CSW's note in the chart, this information had been given to the patient's spouse in the ED. This CSW reiterated the information and assured the patient's spouse that [facility] by T[facility].   CSW has sent full referral for SNF to prepare for possible discharge as the referral process will be more extensive due to the patient's needs.  Argentina PonderKaren Martha Daoud Lobue, MSW, Theresia MajorsLCSWA [phone]

## 2016-12-28 NOTE — Progress Notes (Signed)
Lovenox 40 mg daily changed to 30 mg daily for TBW <45 kg per protocol/decision support.

## 2016-12-28 NOTE — Evaluation (Signed)
Physical Therapy Evaluation Patient Details Name: Ashley Young MRN: [id number] DOB: 03-07-1941 Today's Date: 12/28/2016   History of Present Illness  Ashley Young  is a 76 y.o. female with a known history of severe MDD/dementia who Has been in the emergency room since May 29 for severe depression. Plan was for patient to be transferred to geriatric psychiatry facility. She has been evaluated daily by psychiatry.  Clinical Impression  Pt presents to PT with decrease cognition, fear of falling, L hip pain radiating to foot, inability to stand or walk and requires Max A for basic bed mobility and transfer to sitting EOB.  Pt's spouse at bedside and pt very agreeable until attempting standing and ambulation and then her agitation started to increase.  Suspect injury to spine/nerve as cause for radiating pain and communicated with Dr. Eliane DecreeS. Patel PT findings.  Pt will benefit from acute PT services to address objective findings.    Follow Up Recommendations SNF;Supervision/Assistance - 24 hour    Equipment Recommendations  Other (comment) (defer to post acute)    Recommendations for Other Services OT consult     Precautions / Restrictions Precautions Precautions: Fall Precaution Comments: fall Restrictions Weight Bearing Restrictions: No      Mobility  Bed Mobility Overal bed mobility: Needs Assistance Bed Mobility: Supine to Sit;Sit to Supine     Supine to sit: Mod assist Sit to supine: Max assist   General bed mobility comments: Able to assist with moving legs to side of bed, but unable to scoot hips to edge; HOB elevated to 45 degrees  Transfers Overall transfer level: Needs assistance Equipment used: Rolling walker (2 wheeled) Transfers: Sit to/from Stand Sit to Stand: Max assist         General transfer comment: Max A for lift off and unable to balance with RW, heavy posterior lean and very little weight bearing through L LE  Ambulation/Gait   Ambulation  Distance (Feet): 0 Feet         General Gait Details: unable to ambulate at this time  Stairs            Wheelchair Mobility    Modified Rankin (Stroke Patients Only)       Balance Overall balance assessment: Needs assistance;History of Falls Sitting-balance support: Bilateral upper extremity supported;Feet supported Sitting balance-Leahy Scale: Fair Sitting balance - Comments: feeling like she is sliding off bed, increased posterior lean Postural control: Posterior lean Standing balance support: Bilateral upper extremity supported;During functional activity Standing balance-Leahy Scale: Poor Standing balance comment: unable to maintain upright balance with Max A +2, heavy posterior lean with legs braced against bed; very fearful and anxious                             Pertinent Vitals/Pain Pain Assessment: 0-10 Pain Score: 6  Pain Location: top of L hip down the left leg past knee to foot Pain Descriptors / Indicators: Aching;Constant;Shooting Pain Intervention(s): Limited activity within patient's tolerance;Monitored during session;Repositioned    Home Living Family/patient expects to be discharged to:: Private residence Living Arrangements: Spouse/significant other Available Help at Discharge: Family (spouse) Type of Home: House Home Access: Ramped entrance     Home Layout: One level Home Equipment: Walker - 2 wheels Additional Comments: Pt has decominsated over the past 10 days prior to admission.    Prior Function Level of Independence: Needs assistance   Gait / Transfers Assistance Needed: bedroom>bathroom with RW with supervision  assist   ADL's / Homemaking Assistance Needed: total assist        Hand Dominance        Extremity/Trunk Assessment   Upper Extremity Assessment Upper Extremity Assessment: Overall WFL for tasks assessed;Generalized weakness    Lower Extremity Assessment Lower Extremity Assessment: Overall WFL for  tasks assessed;Generalized weakness    Cervical / Trunk Assessment Cervical / Trunk Assessment: Normal  Communication   Communication: No difficulties  Cognition Arousal/Alertness: Awake/alert Behavior During Therapy: WFL for tasks assessed/performed Overall Cognitive Status: History of cognitive impairments - at baseline                                 General Comments: disoriented to date; know president      General Comments General comments (skin integrity, edema, etc.): no soft tissue injuries noted; skin intact    Exercises General Exercises - Lower Extremity Ankle Circles/Pumps: AAROM;Strengthening;Both;10 reps Quad Sets: AROM;Strengthening;Both;10 reps Heel Slides: AAROM;Strengthening;Both;10 reps Hip ABduction/ADduction: AAROM;Strengthening;Both;10 reps Straight Leg Raises: AROM;Strengthening;Both;5 reps   Assessment/Plan    PT Assessment Patient needs continued PT services  PT Problem List Decreased strength;Decreased activity tolerance;Decreased balance;Decreased mobility;Decreased cognition;Decreased knowledge of use of DME;Decreased safety awareness;Pain       PT Treatment Interventions DME instruction;Gait training;Stair training;Functional mobility training;Therapeutic activities;Therapeutic exercise;Balance training;Patient/family education    PT Goals (Current goals can be found in the Care Plan section)  Acute Rehab PT Goals Patient Stated Goal: To walk again. PT Goal Formulation: With patient Time For Goal Achievement: 01/11/17 Potential to Achieve Goals: Fair    Frequency Min 2X/week   Barriers to discharge Decreased caregiver support Caregiver burden    Co-evaluation               AM-PAC PT "6 Clicks" Daily Activity  Outcome Measure Difficulty turning over in bed (including adjusting bedclothes, sheets and blankets)?: Total Difficulty moving from lying on back to sitting on the side of the bed? : Total Difficulty sitting  down on and standing up from a chair with arms (e.g., wheelchair, bedside commode, etc,.)?: Total Help needed moving to and from a bed to chair (including a wheelchair)?: Total Help needed walking in hospital room?: Total Help needed climbing 3-5 steps with a railing? : Total 6 Click Score: 6    End of Session Equipment Utilized During Treatment: Gait belt Activity Tolerance: Patient limited by pain;Treatment limited secondary to agitation Patient left: in bed;with call bell/phone within reach Nurse Communication: Mobility status;Patient requests pain meds PT Visit Diagnosis: Unsteadiness on feet (R26.81);Muscle weakness (generalized) (M62.81);History of falling (Z91.81);Adult, failure to thrive (R62.7)    Time: 0931-1025 PT Time Calculation (min) (ACUTE ONLY): 54 min   Charges:   PT Evaluation $PT Eval High Complexity: 1 Procedure PT Treatments $Therapeutic Exercise: 8-22 mins $Therapeutic Activity: 8-22 mins   PT G Codes:   PT G-Codes **NOT FOR INPATIENT CLASS** Functional Assessment Tool Used: AM-PAC 6 Clicks Basic Mobility Functional Limitation: Mobility: Walking and moving around Mobility: Walking and Moving Around Current Status (Z6109): 100 percent impaired, limited or restricted Mobility: Walking and Moving Around Goal Status (U0454): At least 60 percent but less than 80 percent impaired, limited or restricted    Dymir Neeson A Analiyah Lechuga, PT 12/28/2016, 11:01 AM

## 2016-12-28 NOTE — Progress Notes (Signed)
MEDICATION RELATED CONSULT NOTE  Pharmacy Consult for electrolyte monitoring and dosing Indication: hypokalemia  Assessment: Pt is [age] yoF with hx of sever MDD/dementia and has been in ER since 5/29 for severe depression. Plan was to be transferred to geriatric psych facility but pt now being admitted due to poor PO intake and severe hypokalemia. Pt has 20 mEq KCl running in 0.9% NaCl at 75 mL/hr  6/1 1537 K = 3.2; Mag = 2.2  Goal of Therapy:  Electrolytes WNL  Plan:  Will give KCl 40 mEq PO BID x 2 doses. Will recheck K and Magnesium   Allergies  Allergen Reactions  . Penicillins Swelling and Rash  . Aspirin Other (See Comments)    GI bleed Stomach ulcers, bleeding   . Ferrous Gluconate Nausea Only    Cannot take in IV form  . Iron Polysaccharide Rash  . Nsaids Rash    Severe GI upset and prior ulcer  . Other Rash    Venofen- unknown  . Sulfamethoxazole-Trimethoprim Rash  . Venofer [Iron Sucrose] Rash    Patient Measurements: Height: 5\' 3"  (160 cm) Weight: 87 lb 1.6 oz (39.5 kg) IBW/kg (Calculated) : 52.4  Vital Signs: Temp: 98.4 F (36.9 C) (06/02 0726) Temp Source: Oral (06/02 0726) BP: 104/46 (06/02 0726) Pulse Rate: 92 (06/02 0726) Intake/Output from previous day: 06/01 0701 - 06/02 0700 In: 768.8 [I.V.:768.8] Out: -  Intake/Output from this shift: No intake/output data recorded.  Labs:  Recent Labs  12/27/16 1537 12/27/16 1859 12/28/16 0703  WBC  --  6.6 6.3  HGB  --  10.8* 11.2*  HCT  --  33.4* 33.6*  PLT  --  259 226  CREATININE 0.54 0.60 0.45  MG  --  1.6* 2.2  PHOS  --   --  2.6  ALBUMIN 2.4*  --   --   PROT 5.4*  --   --   AST 21  --   --   ALT 21  --   --   ALKPHOS 113  --   --   BILITOT 0.6  --   --    Estimated Creatinine Clearance: 36.7 mL/min (by C-G formula based on SCr of 0.45 mg/dL).   Microbiology: Recent Results (from the past 720 hour(s))  Urine culture     Status: None   Collection Time: 12/24/16  4:15 PM  Result  Value Ref Range Status   Specimen Description URINE, RANDOM  Final   Special Requests NONE  Final   Culture   Final    NO GROWTH Performed at [facility], [address] [place], [place] [address]    Report Status 12/25/2016 FINAL  Final    Medical History: Past Medical History:  Diagnosis Date  . Anemia   . Arthritis   . B12 deficiency   . Blood transfusion without reported diagnosis   . CHF (congestive heart failure) (HCC)   . Depression   . Dysrhythmia    TACHYCARDIA  . Edema    MILD OF ANKLES  . Failure to thrive (0-17)   . GERD (gastroesophageal reflux disease)   . Headache    MIGRAINES  . Hypercholesterolemia   . IDA (iron deficiency anemia)   . Microcytic anemia   . Migraine   . Multiple gastric ulcers   . Renal disorder    Multiple episodes of kidney stones.     Medications:  Scheduled:  . amitriptyline  50 mg Oral QHS  . ARIPiprazole  2 mg Oral Daily  . calcium-vitamin D  1 tablet Oral Q breakfast  . enoxaparin (LOVENOX) injection  30 mg Subcutaneous Q24H  . FLUoxetine  30 mg Oral Daily  . latanoprost  1 drop Both Eyes QHS  . mirtazapine  7.5 mg Oral QHS  . multivitamin with minerals  1 tablet Oral Daily  . pantoprazole  40 mg Oral Daily  . tamsulosin  0.4 mg Oral Daily   Infusions:  . 0.9 % NaCl with KCl 20 mEq / L 75 mL/hr at 12/27/16 1945    Demetrius Charity, PharmD  12/28/2016 8:44 AM

## 2016-12-29 ENCOUNTER — Inpatient Hospital Stay: Payer: Medicare Other

## 2016-12-29 ENCOUNTER — Inpatient Hospital Stay: Payer: Medicare Other | Admitting: Anesthesiology

## 2016-12-29 ENCOUNTER — Encounter
Admission: EM | Disposition: A | Discharge status: Skilled Nursing Facility | Payer: Self-pay | Source: Home / Self Care | Attending: Internal Medicine

## 2016-12-29 ENCOUNTER — Encounter: Payer: Self-pay | Admitting: Anesthesiology

## 2016-12-29 HISTORY — PX: INTRAMEDULLARY (IM) NAIL INTERTROCHANTERIC: SHX5875

## 2016-12-29 LAB — BASIC METABOLIC PANEL
Anion gap: 1 — ABNORMAL LOW (ref 5–15)
BUN: 12 mg/dL (ref 6–20)
CALCIUM: 7.7 mg/dL — AB (ref 8.9–10.3)
CO2: 28 mmol/L (ref 22–32)
CREATININE: 0.34 mg/dL — AB (ref 0.44–1.00)
Chloride: 109 mmol/L (ref 101–111)
Glucose, Bld: 83 mg/dL (ref 65–99)
Potassium: 5.7 mmol/L — ABNORMAL HIGH (ref 3.5–5.1)
SODIUM: 138 mmol/L (ref 135–145)

## 2016-12-29 LAB — MAGNESIUM: MAGNESIUM: 2 mg/dL (ref 1.7–2.4)

## 2016-12-29 LAB — SURGICAL PCR SCREEN
MRSA, PCR: NEGATIVE
STAPHYLOCOCCUS AUREUS: NEGATIVE

## 2016-12-29 LAB — POTASSIUM: Potassium: 5 mmol/L (ref 3.5–5.1)

## 2016-12-29 SURGERY — FIXATION, FRACTURE, INTERTROCHANTERIC, WITH INTRAMEDULLARY ROD
Anesthesia: General | Laterality: Left

## 2016-12-29 MED ORDER — PHENYLEPHRINE HCL 10 MG/ML IJ SOLN
INTRAMUSCULAR | Status: DC | PRN
  Administered 2016-12-29: 100 ug via INTRAVENOUS
  Administered 2016-12-29: 200 ug via INTRAVENOUS
  Administered 2016-12-29: 100 ug via INTRAVENOUS
  Administered 2016-12-29: 200 ug via INTRAVENOUS

## 2016-12-29 MED ORDER — CLINDAMYCIN PHOSPHATE 600 MG/50ML IV SOLN
600.0000 mg | Freq: Four times a day (QID) | INTRAVENOUS | Status: DC
  Filled 2016-12-29 (×3): qty 50

## 2016-12-29 MED ORDER — SUGAMMADEX SODIUM 200 MG/2ML IV SOLN
INTRAVENOUS | Status: AC
  Filled 2016-12-29: qty 2

## 2016-12-29 MED ORDER — ONDANSETRON HCL 4 MG/2ML IJ SOLN
INTRAMUSCULAR | Status: DC | PRN
  Administered 2016-12-29: 4 mg via INTRAVENOUS

## 2016-12-29 MED ORDER — CLINDAMYCIN PHOSPHATE 600 MG/50ML IV SOLN
600.0000 mg | Freq: Once | INTRAVENOUS | Status: AC
  Administered 2016-12-29: 600 mg via INTRAVENOUS
  Filled 2016-12-29: qty 50

## 2016-12-29 MED ORDER — MORPHINE SULFATE (PF) 2 MG/ML IV SOLN: 0.5000 mg | INTRAVENOUS | Status: DC | PRN

## 2016-12-29 MED ORDER — SODIUM CHLORIDE FLUSH 0.9 % IV SOLN
INTRAVENOUS | Status: AC
  Filled 2016-12-29: qty 3

## 2016-12-29 MED ORDER — SUCCINYLCHOLINE CHLORIDE 20 MG/ML IJ SOLN
INTRAMUSCULAR | Status: DC | PRN
  Administered 2016-12-29: 60 mg via INTRAVENOUS

## 2016-12-29 MED ORDER — PROPOFOL 10 MG/ML IV BOLUS
INTRAVENOUS | Status: DC | PRN
  Administered 2016-12-29: 70 mg via INTRAVENOUS

## 2016-12-29 MED ORDER — MAGNESIUM HYDROXIDE 400 MG/5ML PO SUSP: 30.0000 mL | Freq: Every day | ORAL | Status: DC | PRN

## 2016-12-29 MED ORDER — ALUM & MAG HYDROXIDE-SIMETH 200-200-20 MG/5ML PO SUSP: 30.0000 mL | ORAL | Status: DC | PRN

## 2016-12-29 MED ORDER — LIDOCAINE HCL (PF) 2 % IJ SOLN
INTRAMUSCULAR | Status: AC
  Filled 2016-12-29: qty 2

## 2016-12-29 MED ORDER — LIDOCAINE HCL (CARDIAC) 20 MG/ML IV SOLN
INTRAVENOUS | Status: DC | PRN
  Administered 2016-12-29: 50 mg via INTRAVENOUS

## 2016-12-29 MED ORDER — NEOMYCIN-POLYMYXIN B GU 40-200000 IR SOLN
Status: AC
  Filled 2016-12-29: qty 2

## 2016-12-29 MED ORDER — FENTANYL CITRATE (PF) 100 MCG/2ML IJ SOLN
INTRAMUSCULAR | Status: AC
  Filled 2016-12-29: qty 2

## 2016-12-29 MED ORDER — FENTANYL CITRATE (PF) 100 MCG/2ML IJ SOLN: 25.0000 ug | INTRAMUSCULAR | Status: DC | PRN

## 2016-12-29 MED ORDER — PROPOFOL 10 MG/ML IV BOLUS
INTRAVENOUS | Status: AC
  Filled 2016-12-29: qty 20

## 2016-12-29 MED ORDER — SUGAMMADEX SODIUM 200 MG/2ML IV SOLN
INTRAVENOUS | Status: DC | PRN
  Administered 2016-12-29: 80 mg via INTRAVENOUS

## 2016-12-29 MED ORDER — ONDANSETRON HCL 4 MG/2ML IJ SOLN
INTRAMUSCULAR | Status: AC
  Filled 2016-12-29: qty 2

## 2016-12-29 MED ORDER — SODIUM CHLORIDE 0.9 % IV SOLN
INTRAVENOUS | Status: DC
  Administered 2016-12-29: 18:00:00 via INTRAVENOUS

## 2016-12-29 MED ORDER — NEOMYCIN-POLYMYXIN B GU 40-200000 IR SOLN
Status: DC | PRN
  Administered 2016-12-29: 2 mL

## 2016-12-29 MED ORDER — LACTATED RINGERS IV SOLN
INTRAVENOUS | Status: DC | PRN
  Administered 2016-12-29: 16:00:00 via INTRAVENOUS

## 2016-12-29 MED ORDER — DEXAMETHASONE SODIUM PHOSPHATE 10 MG/ML IJ SOLN
INTRAMUSCULAR | Status: DC | PRN
  Administered 2016-12-29: 5 mg via INTRAVENOUS

## 2016-12-29 MED ORDER — OXYCODONE HCL 5 MG PO TABS: 5.0000 mg | ORAL_TABLET | Freq: Once | ORAL | Status: DC | PRN

## 2016-12-29 MED ORDER — FENTANYL CITRATE (PF) 100 MCG/2ML IJ SOLN
INTRAMUSCULAR | Status: DC | PRN
  Administered 2016-12-29 (×2): 50 ug via INTRAVENOUS

## 2016-12-29 MED ORDER — ENOXAPARIN SODIUM 30 MG/0.3ML ~~LOC~~ SOLN
30.0000 mg | SUBCUTANEOUS | Status: DC
  Administered 2016-12-30 – 2016-12-31 (×2): 30 mg via SUBCUTANEOUS
  Filled 2016-12-29 (×2): qty 0.3

## 2016-12-29 MED ORDER — CLINDAMYCIN HCL 150 MG PO CAPS
600.0000 mg | ORAL_CAPSULE | Freq: Four times a day (QID) | ORAL | Status: AC
  Administered 2016-12-29 – 2016-12-30 (×3): 600 mg via ORAL
  Filled 2016-12-29 (×2): qty 4

## 2016-12-29 MED ORDER — PHENOL 1.4 % MT LIQD
1.0000 | OROMUCOSAL | Status: DC | PRN
  Filled 2016-12-29: qty 177

## 2016-12-29 MED ORDER — MENTHOL 3 MG MT LOZG
1.0000 | LOZENGE | OROMUCOSAL | Status: DC | PRN
  Filled 2016-12-29: qty 9

## 2016-12-29 MED ORDER — MAGNESIUM CITRATE PO SOLN
1.0000 | Freq: Once | ORAL | Status: DC | PRN
  Filled 2016-12-29: qty 296

## 2016-12-29 MED ORDER — ROCURONIUM BROMIDE 100 MG/10ML IV SOLN
INTRAVENOUS | Status: DC | PRN
  Administered 2016-12-29: 10 mg via INTRAVENOUS

## 2016-12-29 MED ORDER — KETOROLAC TROMETHAMINE 30 MG/ML IJ SOLN
INTRAMUSCULAR | Status: AC
  Filled 2016-12-29: qty 1

## 2016-12-29 MED ORDER — DOCUSATE SODIUM 100 MG PO CAPS
100.0000 mg | ORAL_CAPSULE | Freq: Two times a day (BID) | ORAL | Status: DC
  Administered 2016-12-29 – 2016-12-31 (×4): 100 mg via ORAL
  Filled 2016-12-29 (×4): qty 1

## 2016-12-29 SURGICAL SUPPLY — 30 items
BIT DRILL CANN LG 4.3MM (BIT) ×1 IMPLANT
CANISTER SUCT 1200ML W/VALVE (MISCELLANEOUS) ×2 IMPLANT
CHLORAPREP W/TINT 26ML (MISCELLANEOUS) ×2 IMPLANT
DRAPE SHEET LG 3/4 BI-LAMINATE (DRAPES) ×2 IMPLANT
DRAPE U-SHAPE 47X51 STRL (DRAPES) IMPLANT
DRILL BIT CANN LG 4.3MM (BIT) ×2
DRSG OPSITE POSTOP 3X4 (GAUZE/BANDAGES/DRESSINGS) ×6 IMPLANT
GLOVE BIOGEL PI IND STRL 9 (GLOVE) ×1 IMPLANT
GLOVE BIOGEL PI INDICATOR 9 (GLOVE) ×1
GLOVE SURG SYN 9.0  PF PI (GLOVE) ×3
GLOVE SURG SYN 9.0 PF PI (GLOVE) ×3 IMPLANT
GOWN SRG 2XL LVL 4 RGLN SLV (GOWNS) ×1 IMPLANT
GOWN STRL NON-REIN 2XL LVL4 (GOWNS) ×1
GOWN STRL REUS W/ TWL LRG LVL3 (GOWN DISPOSABLE) ×1 IMPLANT
GOWN STRL REUS W/TWL LRG LVL3 (GOWN DISPOSABLE) ×1
GUIDEPIN VERSANAIL DSP 3.2X444 (ORTHOPEDIC DISPOSABLE SUPPLIES) ×2 IMPLANT
IV NS 500ML (IV SOLUTION) ×1
IV NS 500ML BAXH (IV SOLUTION) ×1 IMPLANT
KIT RM TURNOVER STRD PROC AR (KITS) ×2 IMPLANT
MAT BLUE FLOOR 46X72 FLO (MISCELLANEOUS) ×2 IMPLANT
NAIL HIP FRACT 130D 11X180 (Screw) ×2 IMPLANT
NEEDLE FILTER BLUNT 18X 1/2SAF (NEEDLE) ×1
NEEDLE FILTER BLUNT 18X1 1/2 (NEEDLE) ×1 IMPLANT
PACK HIP COMPR (MISCELLANEOUS) ×2 IMPLANT
SCREW BONE CORTICAL 5.0X42 (Screw) ×2 IMPLANT
SCREW LAG HIP NAIL 10.5X95 (Screw) ×2 IMPLANT
STAPLER SKIN PROX 35W (STAPLE) ×2 IMPLANT
SUT VIC AB 1 CT1 36 (SUTURE) ×2 IMPLANT
SUT VIC AB 2-0 CT1 (SUTURE) ×2 IMPLANT
SYRINGE 10CC LL (SYRINGE) ×2 IMPLANT

## 2016-12-29 NOTE — Anesthesia Procedure Notes (Signed)
Procedure Name: Intubation Performed by: Mathews ArgyleLOGAN, Aniken Monestime Pre-anesthesia Checklist: Patient identified, Patient being monitored, Timeout performed, Emergency Drugs available and Suction available Patient Re-evaluated:Patient Re-evaluated prior to inductionOxygen Delivery Method: Circle system utilized Preoxygenation: Pre-oxygenation with 100% oxygen Intubation Type: IV induction and Rapid sequence Laryngoscope Size: Miller and 2 Grade View: Grade I Tube type: Oral Tube size: 7.0 mm Number of attempts: 1 Placement Confirmation: ETT inserted through vocal cords under direct vision,  positive ETCO2 and breath sounds checked- equal and bilateral Secured at: 20 cm Tube secured with: Tape Dental Injury: Teeth and Oropharynx as per pre-operative assessment

## 2016-12-29 NOTE — Progress Notes (Signed)
[employer] [facility] [place]                                                                                                                                                                                  Patient Demographics   Ashley Young, is a 76 y.o. female, DOB - [birth year], ZOX:[id number]  Admit date - 12/24/2016   Admitting Physician Adrian Saran, MD  Outpatient Primary MD for the patient is Marisue Ivan, MD   LOS - 2  Subjective: Patient noted to have nondisplaced intertrochanteric fracture of the left continues to have pain but better compared to yesterday    Review of Systems:   CONSTITUTIONAL: No documented fever. No fatigue, weakness. No weight gain, no weight loss.  EYES: No blurry or double vision.  ENT: No tinnitus. No postnasal drip. No redness of the oropharynx.  RESPIRATORY: No cough, no wheeze, no hemoptysis. No dyspnea.  CARDIOVASCULAR: No chest pain. No orthopnea. No palpitations. No syncope.  GASTROINTESTINAL: No nausea, no vomiting or diarrhea. No abdominal pain. No melena or hematochezia.  GENITOURINARY: No dysuria or hematuria.  ENDOCRINE: No polyuria or nocturia. No heat or cold intolerance.  HEMATOLOGY: No anemia. No bruising. No bleeding.  INTEGUMENTARY: No rashes. No lesions.  MUSCULOSKELETAL: No arthritis. No swelling. No gout. Left hip/pelvic region pain NEUROLOGIC: No numbness, tingling, or ataxia. No seizure-type activity.  PSYCHIATRIC: No anxiety. No insomnia. No ADD.    Vitals:   Vitals:   12/28/16 1339 12/28/16 2329 12/29/16 0643 12/29/16 0713  BP: (!) 91/49 (!) 93/50  (!) 102/43  Pulse: (!) 114 91  (!) 109  Resp: 18 18  18   Temp: 99.1 F (37.3 C) 97.9 F (36.6 C)  97.7 F (36.5 C)  TempSrc: Oral   Oral  SpO2: 97% 98%  97%  Weight:   87 lb 4.8 oz (39.6 kg)   Height:        Wt Readings from Last 3 Encounters:  12/29/16 87 lb 4.8 oz (39.6 kg)  05/17/16 96 lb 4 oz (43.7 kg)  01/12/16 102 lb (46.3  kg)     Intake/Output Summary (Last 24 hours) at 12/29/16 1257 Last data filed at 12/29/16 0851  Gross per 24 hour  Intake             1270 ml  Output              275 ml  Net              995 ml    Physical Exam:   GENERAL: Pleasant-appearing in no apparent distress.  HEAD, EYES, EARS, NOSE AND THROAT: Atraumatic, normocephalic. Extraocular muscles are intact.  Pupils equal and reactive to light. Sclerae anicteric. No conjunctival injection. No oro-pharyngeal erythema.  NECK: Supple. There is no jugular venous distention. No bruits, no lymphadenopathy, no thyromegaly.  HEART: Regular rate and rhythm,. No murmurs, no rubs, no clicks.  LUNGS: Clear to auscultation bilaterally. No rales or rhonchi. No wheezes.  ABDOMEN: Soft, flat, nontender, nondistended. Has good bowel sounds. No hepatosplenomegaly appreciated.  EXTREMITIES: No evidence of any cyanosis, clubbing, or peripheral edema.  +2 pedal and radial pulses bilaterally.  NEUROLOGIC: The patient is alert, awake, and oriented x1 with no focal motor or sensory deficits appreciated bilaterally.  SKIN: Moist and warm with no rashes appreciated.  Psych: Not anxious, depressed LN: No inguinal LN enlargement    Antibiotics   Anti-infectives    Start     Dose/Rate Route Frequency Ordered Stop   12/29/16 1600  clindamycin (CLEOCIN) IVPB 600 mg     600 mg 100 mL/hr over 30 Minutes Intravenous  Once 12/29/16 1233        Medications   Scheduled Meds: . amitriptyline  50 mg Oral QHS  . ARIPiprazole  2 mg Oral Daily  . calcium-vitamin D  1 tablet Oral Q breakfast  . FLUoxetine  30 mg Oral Daily  . latanoprost  1 drop Both Eyes QHS  . mirtazapine  7.5 mg Oral QHS  . multivitamin with minerals  1 tablet Oral Daily  . pantoprazole  40 mg Oral Daily  . tamsulosin  0.4 mg Oral Daily   Continuous Infusions: . clindamycin (CLEOCIN) IV     PRN Meds:.acetaminophen **OR** acetaminophen, bisacodyl, ondansetron **OR** ondansetron  (ZOFRAN) IV, oxyCODONE, senna-docusate, SUMAtriptan, traMADol   Data Review:   Micro Results Recent Results (from the past 240 hour(s))  Urine culture     Status: None   Collection Time: 12/24/16  4:15 PM  Result Value Ref Range Status   Specimen Description URINE, RANDOM  Final   Special Requests NONE  Final   Culture   Final    NO GROWTH Performed at [facility], [address] [place], [place] [address]    Report Status 12/25/2016 FINAL  Final    Radiology Reports Dg Pelvis 1-2 Views  Result Date: 12/26/2016 CLINICAL DATA:  Bilateral hip pain EXAM: PELVIS - 1-2 VIEW COMPARISON:  01/12/2016 FINDINGS: Bones are diffusely demineralized. Patient is status post sacral plasty. Age-indeterminate fractures right superior inferior pubic rami evident. Three cannulated compression screws transfix a right femoral neck fracture, similar to prior. IMPRESSION: Osteopenia with age indeterminate right superior and inferior pubic rami fractures. Electronically Signed   By: Kennith CenterEric  Mansell M.D.   On: 12/26/2016 17:17   Ct Pelvis Wo Contrast  Result Date: 12/28/2016 CLINICAL DATA:  Recent fall with left hip pain, initial encounter EXAM: CT PELVIS WITHOUT CONTRAST TECHNIQUE: Multidetector CT imaging of the pelvis was performed following the standard protocol without intravenous contrast. COMPARISON:  12/26/2016 FINDINGS: Urinary Tract: Tiny nonobstructing right renal stone is noted. The bladder is well distended. Bowel:  Visualized bowel appears within normal limits. Vascular/Lymphatic: Diffuse vascular calcifications are noted. No significant lymphadenopathy is noted. Reproductive:  No mass or other significant abnormality Other:  None. Musculoskeletal: Mild degenerative changes of lumbar spine are seen. Changes of prior vertebral augmentation are noted at L5. Healing pubic rami fractures are noted on the right both in the superior and inferior pubic ramus. Prior pinning of the proximal right femur is  seen. There is an incomplete fracture of the proximal left femur involving  the femoral neck at its junction with the intratrochanteric region. A small amount of joint fluid is noted. Mild subcutaneous edema from the injury is noted. No other fractures are seen. IMPRESSION: Incomplete fracture of the left femoral neck at its junction with the intratrochanteric region. This is best visualized on coronal reconstructions images of series 6. Healing right pubic rami fractures as well as prior pinning of the proximal right femur. Tiny nonobstructing right renal stone. Electronically Signed   By: Alcide Clever M.D.   On: 12/28/2016 14:53   Dg Chest Portable 1 View  Result Date: 12/24/2016 CLINICAL DATA:  76 y/o  F; agitation. EXAM: PORTABLE CHEST 1 VIEW COMPARISON:  11/09/2014 chest radiograph FINDINGS: Stable borderline cardiomegaly. Aortic atherosclerosis with calcification. Surgical clips project over gastroesophageal junction. Stable hyperinflated lungs. Stable chronic bronchitic changes in the lung bases. No new focal consolidation, effusion, or pneumothorax. Bones are unremarkable. IMPRESSION: No active disease. Electronically Signed   By: Mitzi Hansen M.D.   On: 12/24/2016 16:33     CBC  Recent Labs Lab 12/24/16 1441 12/27/16 1859 12/28/16 0703  WBC 8.2 6.6 6.3  HGB 12.1 10.8* 11.2*  HCT 36.7 33.4* 33.6*  PLT 326 259 226  MCV 85.9 86.7 87.2  MCH 28.2 28.1 29.0  MCHC 32.8 32.5 33.2  RDW 15.6* 15.2* 15.2*    Chemistries   Recent Labs Lab 12/24/16 1441 12/27/16 1537 12/27/16 1859 12/28/16 0703 12/29/16 0340  NA 135 138  --  137 138  K 3.7 2.5*  --  3.2* 5.7*  CL 106 101  --  104 109  CO2 22 29  --  26 28  GLUCOSE 94 94  --  82 83  BUN 16 11  --  11 12  CREATININE 0.69 0.54 0.60 0.45 0.34*  CALCIUM 8.4* 8.0*  --  7.7* 7.7*  MG  --   --  1.6* 2.2 2.0  AST 25 21  --   --   --   ALT 16 21  --   --   --   ALKPHOS 134* 113  --   --   --   BILITOT 0.3 0.6  --   --   --     ------------------------------------------------------------------------------------------------------------------ estimated creatinine clearance is 36.8 mL/min (A) (by C-G formula based on SCr of 0.34 mg/dL (L)). ------------------------------------------------------------------------------------------------------------------ No results for input(s): HGBA1C in the last 72 hours. ------------------------------------------------------------------------------------------------------------------ No results for input(s): CHOL, HDL, LDLCALC, TRIG, CHOLHDL, LDLDIRECT in the last 72 hours. ------------------------------------------------------------------------------------------------------------------ No results for input(s): TSH, T4TOTAL, T3FREE, THYROIDAB in the last 72 hours.  Invalid input(s): FREET3 ------------------------------------------------------------------------------------------------------------------ No results for input(s): VITAMINB12, FOLATE, FERRITIN, TIBC, IRON, RETICCTPCT in the last 72 hours.  Coagulation profile No results for input(s): INR, PROTIME in the last 168 hours.  No results for input(s): DDIMER in the last 72 hours.  Cardiac Enzymes No results for input(s): CKMB, TROPONINI, MYOGLOBIN in the last 168 hours.  Invalid input(s): CK ------------------------------------------------------------------------------------------------------------------ Invalid input(s): POCBNP    Assessment & Plan  Patient is 76 year old who was actually in the emergency room waiting placement to geriatric psychiatric unit She was noted to have severe hypokalemia and electrolyte disturbances  1. Severe hypokalemia: Patient's potassium now high will repeat BMP in the morning likely over replacement or hemolysis   2. Adult failure to thrive with protein calorie malnutrition: Palliative care consult Dietary consultation for calorie count and feeding supplement  3. Severe MDD:  Psychiatry consult Continue Prozac, Abilify, Remeron and amitriptyline  4. Combined  systolic and diastolic heart failure: No signs of exacerbation Monitor fluid status on IV fluids.   5. Urinary retention: Continue tamsulosin  6 GERD: Continue PPI  7. Hip pain CT consistent with non-displaced fracture of the hip orthopedic consult for further management based on their decision     Code Status Orders        Start     Ordered   12/27/16 1831  Full code  Continuous     12/27/16 1831    Code Status History    Date Active Date Inactive Code Status Order ID Comments User Context   12/27/2016  4:36 PM 12/27/2016  6:31 PM DNR [id number]  Adrian Saran, MD ED           Consults  pyschiatry  DVT Prophylaxis  Lovenox    Lab Results  Component Value Date   PLT 226 12/28/2016     Time Spent in minutes  Greater than 50% of time spent in care coordination and counseling patient regarding the condition and plan of care.   Auburn Bilberry M.D on 12/29/2016 at 12:57 PM  Between 7am to 6pm - Pager - [phone]  After 6pm go to [email or web address] - password [email or web address] [facility]  [facility]   Office  [phone]

## 2016-12-29 NOTE — Progress Notes (Addendum)
MEDICATION RELATED CONSULT NOTE  Pharmacy Consult for electrolyte monitoring and dosing Indication: hypokalemia  Assessment: Pt is 79 yoF with hx of sever MDD/dementia and has been in ER since 5/29 for severe depression. Plan was to be transferred to geriatric psych facility but pt now being admitted due to poor PO intake and severe hypokalemia. Pt has 20 mEq KCl running in 0.9% NaCl at 75 mL/hr  6/1 1537 K = 3.2; Mag = 2.2  Goal of Therapy:  Electrolytes WNL  Plan:  Will give KCl 40 mEq PO BID x 2 doses. Will recheck K and Magnesium   Allergies  Allergen Reactions  . Penicillins Swelling and Rash  . Aspirin Other (See Comments)    GI bleed Stomach ulcers, bleeding   . Ferrous Gluconate Nausea Only    Cannot take in IV form  . Iron Polysaccharide Rash  . Nsaids Rash    Severe GI upset and prior ulcer  . Other Rash    Venofen- unknown  . Sulfamethoxazole-Trimethoprim Rash  . Venofer [Iron Sucrose] Rash    Patient Measurements: Height: 5\' 3"  (160 cm) Weight: 87 lb 1.6 oz (39.5 kg) IBW/kg (Calculated) : 52.4  Vital Signs: Temp: 97.9 F (36.6 C) (06/02 2329) BP: 93/50 (06/02 2329) Pulse Rate: 91 (06/02 2329) Intake/Output from previous day: 06/02 0701 - 06/03 0700 In: 1390 [P.O.:360; I.V.:1030] Out: 25 [Urine:25] Intake/Output from this shift: Total I/O In: 130 [I.V.:130] Out: 25 [Urine:25]  Labs:  Recent Labs  12/27/16 1537 12/27/16 1859 12/28/16 0703 12/29/16 0340  WBC  --  6.6 6.3  --   HGB  --  10.8* 11.2*  --   HCT  --  33.4* 33.6*  --   PLT  --  259 226  --   CREATININE 0.54 0.60 0.45 0.34*  MG  --  1.6* 2.2 2.0  PHOS  --   --  2.6  --   ALBUMIN 2.4*  --   --   --   PROT 5.4*  --   --   --   AST 21  --   --   --   ALT 21  --   --   --   ALKPHOS 113  --   --   --   BILITOT 0.6  --   --   --    Estimated Creatinine Clearance: 36.7 mL/min (A) (by C-G formula based on SCr of 0.34 mg/dL (L)).   Microbiology: Recent Results (from the past 720  hour(s))  Urine culture     Status: None   Collection Time: 12/24/16  4:15 PM  Result Value Ref Range Status   Specimen Description URINE, RANDOM  Final   Special Requests NONE  Final   Culture   Final    NO GROWTH Performed at [facility], [address], [place], [place] [address]    Report Status 12/25/2016 FINAL  Final    Medical History: Past Medical History:  Diagnosis Date  . Anemia   . Arthritis   . B12 deficiency   . Blood transfusion without reported diagnosis   . CHF (congestive heart failure) (HCC)   . Depression   . Dysrhythmia    TACHYCARDIA  . Edema    MILD OF ANKLES  . Failure to thrive (0-17)   . GERD (gastroesophageal reflux disease)   . Headache    MIGRAINES  . Hypercholesterolemia   . IDA (iron deficiency anemia)   . Microcytic anemia   .  Migraine   . Multiple gastric ulcers   . Renal disorder    Multiple episodes of kidney stones.     Medications:  Scheduled:  . amitriptyline  50 mg Oral QHS  . ARIPiprazole  2 mg Oral Daily  . calcium-vitamin D  1 tablet Oral Q breakfast  . enoxaparin (LOVENOX) injection  30 mg Subcutaneous Q24H  . FLUoxetine  30 mg Oral Daily  . latanoprost  1 drop Both Eyes QHS  . mirtazapine  7.5 mg Oral QHS  . multivitamin with minerals  1 tablet Oral Daily  . pantoprazole  40 mg Oral Daily  . tamsulosin  0.4 mg Oral Daily   Infusions:  . 0.9 % NaCl with KCl 20 mEq / L Stopped (12/29/16 0607)   6/3 AM K+ 5.7. NS with KCl infusion stopped. F/u BMP ordered for tomorrow AM.  Fulton ReekMatt Danyka Merlin, PharmD, BCPS  12/29/16 6:20 AM
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
## 2016-12-29 NOTE — Anesthesia Post-op Follow-up Note (Cosign Needed)
Anesthesia QCDR form completed.        

## 2016-12-29 NOTE — Progress Notes (Signed)
[place].  Patient is leaving for OR, ABX on the chart.

## 2016-12-29 NOTE — Op Note (Signed)
12/24/2016 - 12/29/2016  5:06 PM  PATIENT:  Ashley CrankerPatricia B Young  76 y.o. female  PRE-OPERATIVE DIAGNOSIS:  left hip fracture intertrochanteric  POST-OPERATIVE DIAGNOSIS:  same intertrochanteric   PROCEDURE:  Procedure(s): INTRAMEDULLARY (IM) NAIL INTERTROCHANTRIC (Left)  SURGEON: Ashley SchullerMichael J Liyana Suniga, MD  ASSISTANTS: None  ANESTHESIA:   general  EBL:  Total I/O In: 490 [P.O.:240; I.V.:250] Out: 300 [Urine:250; Blood:50]  BLOOD ADMINISTERED:none  DRAINS: none   LOCAL MEDICATIONS USED:  NONE  SPECIMEN:  No Specimen  DISPOSITION OF SPECIMEN:  N/A  COUNTS:  YES  TOURNIQUET:  * No tourniquets in log *  IMPLANTS: Biomet affixes 11 x 180 mm 130 rod with 95 mm lag screw and 42 mm interlocking screw  DICTATION: .Dragon Dictation patient brought the operating room and after adequate general anesthesia was obtained the patient's transferred to the fracture table with the right leg in the well-leg holder. The left foot was placed in the traction boot and slightly internally rotated without traction applied. After prepping and draping the sterile fashion and having obtained preoperative AP and lateral C-arm view showing the fracture appropriate patient identification and timeout procedures were completed. A small incision was made proximally greater trochanter and a guide were inserted to the tip of the trochanter and proximal reaming carried out. The rod was then inserted to the appropriate depth and the lateral skin incision was made with a guide were inserted into a near center center position. Measurements made off of this and a 95 mm lag screw inserted to the appropriate depth. This was then tightened proximally with the set screw and then quarter turn to allow for compression. The distal interlocking screw was then placed through the dynamic hole using the Green drill sleeves. Drilling was carried out followed by placing the 42 mm screw the insertion handle was then removed. Permanent C-arm  views were obtained and AP lateral proximally and AP distally showing anatomic alignment. The wounds were irrigated and then closed with #1 Vicryl deep 2-0 Vicryl subcutaneous taste and skin staples followed by Xeroform and honeycomb dressing  PLAN OF CARE: Continue as inpatient  PATIENT DISPOSITION:  PACU - hemodynamically stable.

## 2016-12-29 NOTE — Transfer of Care (Signed)
Immediate Anesthesia Transfer of Care Note  Patient: Ashley Young  Procedure(s) Performed: Procedure(s): INTRAMEDULLARY (IM) NAIL INTERTROCHANTRIC (Left)  Patient Location: PACU  Anesthesia Type:General  Level of Consciousness: awake  Airway & Oxygen Therapy: Patient Spontanous Breathing and Patient connected to face mask oxygen  Post-op Assessment: Report given to RN and Post -op Vital signs reviewed and stable  Post vital signs: Reviewed  Last Vitals:  Vitals:   12/29/16 1518 12/29/16 1707  BP: (!) 99/46 (!) 99/52  Pulse: (!) 115 (!) 101  Resp:  16  Temp: 37 C 37.3 C    Last Pain:  Vitals:   12/29/16 1518  TempSrc: Oral  PainSc:       Patients Stated Pain Goal: 3 (12/27/16 1944)  Complications: No apparent anesthesia complications

## 2016-12-29 NOTE — Anesthesia Postprocedure Evaluation (Signed)
Anesthesia Post Note  Patient: Lamount Crankeratricia B Lorio  Procedure(s) Performed: Procedure(s) (LRB): INTRAMEDULLARY (IM) NAIL INTERTROCHANTRIC (Left)  Patient location during evaluation: PACU Anesthesia Type: General Level of consciousness: awake and alert (patient confused at baseline) Pain management: pain level controlled Vital Signs Assessment: post-procedure vital signs reviewed and stable Respiratory status: spontaneous breathing, nonlabored ventilation, respiratory function stable and patient connected to nasal cannula oxygen Cardiovascular status: blood pressure returned to baseline and stable Postop Assessment: no signs of nausea or vomiting Anesthetic complications: no     Last Vitals:  Vitals:   12/29/16 1723 12/29/16 1739  BP: (!) 88/58 (!) 98/57  Pulse: (!) 101 (!) 101  Resp: 16 16  Temp:      Last Pain:  Vitals:   12/29/16 1754  TempSrc:   PainSc: 0-No pain                 Cleda MccreedyJoseph K Nejla Reasor

## 2016-12-29 NOTE — Consult Note (Signed)
Patient with nondisplaced intertrochanteric fracture on left.  Had been walking up until a few months ago when she has been depressed and much less active. No shortening or external rotation left leg.  Palpable pulses present. Pain with logrolling left leg. Xrays negative but CT shows high intertrochanteric hip fracture. Recommend ORIF prior to displacement, will allow patient to be mobilized.

## 2016-12-29 NOTE — Progress Notes (Signed)
Initial Nutrition Assessment  DOCUMENTATION CODES:   Severe malnutrition in context of chronic illness  INTERVENTION:  1. Monitor for diet advancement, provide ONS w/ advancement 2. 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  Spoke with Ashley Young's Husband, he complains of patient refusing PO medications, food x2 weeks prior to admission. She is currently hallucinating, also found to have incomplete hip fx Weight is down 9#/9.3% over 9 months. Refuses Ensure according to Husband. Nutrition-Focused physical exam completed. Findings are severe fat depletion, severe muscle depletion, and no edema.  Labs and medications reviewed: Ca-Vit D, MVI w/ Minerals  Diet Order:  Diet NPO time specified  Skin:  Reviewed, no issues  Last BM:  12/26/2016  Height:   Ht Readings from Last 1 Encounters:  12/24/16 5\' 3"  (1.6 m)    Weight:   Wt Readings from Last 1 Encounters:  12/29/16 87 lb 4.8 oz (39.6 kg)    Ideal Body Weight:  52.27 kg  BMI:  Body mass index is 15.46 kg/m.  Estimated Nutritional Needs:   Kcal:  1180-1380 calories (30-35 cal/kg)  Protein:  47-55 grams  Fluid:  >/= 1.2L  EDUCATION NEEDS:   Education needs no appropriate at this time  Dionne AnoWilliam M. Madeleyn Schwimmer, MS, RD LDN Inpatient Clinical Dietitian Pager [phone]

## 2016-12-29 NOTE — Anesthesia Preprocedure Evaluation (Signed)
Anesthesia Evaluation  Patient identified by MRN, date of birth, ID band Patient awake and Patient confused    Reviewed: Allergy & Precautions, H&P , NPO status , Patient's Chart, lab work & pertinent test results  History of Anesthesia Complications Negative for: history of anesthetic complications  Airway Mallampati: III  TM Distance: <3 FB Neck ROM: limited    Dental  (+) Poor Dentition, Missing, Edentulous Lower, Edentulous Upper   Pulmonary neg shortness of breath, former smoker,    Pulmonary exam normal breath sounds clear to auscultation       Cardiovascular Exercise Tolerance: Poor (-) angina+CHF  (-) Past MI Normal cardiovascular exam+ dysrhythmias  Rhythm:regular Rate:Normal     Neuro/Psych  Headaches, PSYCHIATRIC DISORDERS Anxiety Depression    GI/Hepatic negative GI ROS, Neg liver ROS, PUD, GERD  Medicated and Controlled,  Endo/Other  negative endocrine ROS  Renal/GU CRFRenal disease     Musculoskeletal  (+) Arthritis ,   Abdominal   Peds  Hematology negative hematology ROS (+)   Anesthesia Other Findings Past Medical History: No date: Anemia No date: Arthritis No date: B12 deficiency No date: Blood transfusion without reported diagnosis No date: CHF (congestive heart failure) (HCC) No date: Depression No date: Dysrhythmia     Comment: TACHYCARDIA No date: Edema     Comment: MILD OF ANKLES No date: Failure to thrive (0-17) No date: GERD (gastroesophageal reflux disease) No date: Headache     Comment: MIGRAINES No date: Hypercholesterolemia No date: IDA (iron deficiency anemia) No date: Microcytic anemia No date: Migraine No date: Multiple gastric ulcers No date: Renal disorder     Comment: Multiple episodes of kidney stones.   Past Surgical History: No date: ABDOMINAL SURGERY     Comment: Reconstructed stomach and 3 different               surgeries.  No date: APPENDECTOMY No date:  BREAST SURGERY     Comment: cyst removal 06/05/2015: CATARACT EXTRACTION W/PHACO Right     Comment: Procedure: CATARACT EXTRACTION PHACO AND               INTRAOCULAR LENS PLACEMENT (IOC);  Surgeon:               Lockie Molahadwick Brasington, MD;  Location: [facility] ORS;                Service: Ophthalmology;  Laterality: Right;  US              01:06  No date: CESAREAN SECTION     Comment: x 3 No date: CHOLECYSTECTOMY No date: COLONOSCOPY No date: ESOPHAGOGASTRODUODENOSCOPY No date: FRACTURE SURGERY     Comment: HIP No date: KYPHOPLASTY  BMI    Body Mass Index:  15.46 kg/m      Reproductive/Obstetrics negative OB ROS                             Anesthesia Physical Anesthesia Plan  ASA: III  Anesthesia Plan: General ETT   Post-op Pain Management:    Induction: Intravenous  Airway Management Planned: Oral ETT  Additional Equipment:   Intra-op Plan:   Post-operative Plan: Extubation in OR  Informed Consent: I have reviewed the patients History and Physical, chart, labs and discussed the procedure including the risks, benefits and alternatives for the proposed anesthesia with the patient or authorized representative who has indicated his/her understanding and acceptance.   Dental Advisory Given  Plan Discussed with:  Anesthesiologist, CRNA and Surgeon  Anesthesia Plan Comments: (Patient is anticoagulated with Lovenox so plan for GA  Patient and family informed that patient is higher risk for complications from anesthesia during this procedure due to their medical history and age including but not limited to post operative cognitive dysfunction.  They voiced understanding.  Husband consented for risks of anesthesia including but not limited to:  - adverse reactions to medications - damage to teeth, lips or other oral mucosa - sore throat or hoarseness - Damage to heart, brain, lungs or loss of life  Husband voiced understanding.)         Anesthesia Quick Evaluation

## 2016-12-30 ENCOUNTER — Encounter: Payer: Self-pay | Admitting: Orthopedic Surgery

## 2016-12-30 DIAGNOSIS — S72002A Fracture of unspecified part of neck of left femur, initial encounter for closed fracture: Secondary | ICD-10-CM

## 2016-12-30 DIAGNOSIS — Z66 Do not resuscitate: Secondary | ICD-10-CM

## 2016-12-30 DIAGNOSIS — Z7189 Other specified counseling: Secondary | ICD-10-CM

## 2016-12-30 DIAGNOSIS — Z515 Encounter for palliative care: Secondary | ICD-10-CM

## 2016-12-30 DIAGNOSIS — S72002D Fracture of unspecified part of neck of left femur, subsequent encounter for closed fracture with routine healing: Secondary | ICD-10-CM

## 2016-12-30 LAB — CBC
HCT: 37.6 % (ref 35.0–47.0)
Hemoglobin: 12.2 g/dL (ref 12.0–16.0)
MCH: 28.1 pg (ref 26.0–34.0)
MCHC: 32.4 g/dL (ref 32.0–36.0)
MCV: 86.7 fL (ref 80.0–100.0)
Platelets: 306 K/uL (ref 150–440)
RBC: 4.34 MIL/uL (ref 3.80–5.20)
RDW: 15.1 % — ABNORMAL HIGH (ref 11.5–14.5)
WBC: 10.4 K/uL (ref 3.6–11.0)

## 2016-12-30 LAB — BASIC METABOLIC PANEL WITH GFR
Anion gap: 8 (ref 5–15)
BUN: 10 mg/dL (ref 6–20)
CO2: 29 mmol/L (ref 22–32)
Calcium: 8.4 mg/dL — ABNORMAL LOW (ref 8.9–10.3)
Chloride: 96 mmol/L — ABNORMAL LOW (ref 101–111)
Creatinine, Ser: 0.32 mg/dL — ABNORMAL LOW (ref 0.44–1.00)
GFR calc Af Amer: >60 mL/min
GFR calc non Af Amer: >60 mL/min
Glucose, Bld: 128 mg/dL — ABNORMAL HIGH (ref 65–99)
Potassium: 4.8 mmol/L (ref 3.5–5.1)
Sodium: 133 mmol/L — ABNORMAL LOW (ref 135–145)

## 2016-12-30 LAB — CALCIUM, IONIZED: Calcium, Ionized, Serum: 5.1 mg/dL (ref 4.5–5.6)

## 2016-12-30 MED ORDER — OXYCODONE HCL 5 MG PO TABS
5.0000 mg | ORAL_TABLET | ORAL | Status: DC | PRN
  Administered 2016-12-30 – 2016-12-31 (×4): 5 mg via ORAL
  Filled 2016-12-30 (×4): qty 1

## 2016-12-30 NOTE — Progress Notes (Signed)
Physical Therapy Treatment Patient Details Name: Ashley Young Neff MRN: [id number] DOB: 09-05-1940 Today's Date: 12/30/2016    History of Present Illness Pt. is a 76 y.o. female who [facility] IM nailing repair of a Left Hip Fracture. Pt. PMHx includes: Severe MDD. Pt. has been in the ER since 12/24/2016 with Svere Depression, Dementia.    PT Comments    Pt "sleepy" this afternoon, but agreeable to bed exercises. Pt participates well with supine exercises with assist as needed on the left. No increased pain throughout session. Rest breaks as needed. Heart rate noted to be 114-119 during minimal exertion with bed exercises. Continue PT to progress range, strength and endurance to improve functional mobility.    Follow Up Recommendations  SNF     Equipment Recommendations  Other (comment) (TBD at next venue of care)    Recommendations for Other Services OT consult     Precautions / Restrictions Precautions Precautions: Fall Precaution Comments: fall Restrictions Weight Bearing Restrictions: Yes LLE Weight Bearing: Weight bearing as tolerated    Mobility  Bed Mobility Overal bed mobility: Needs Assistance Bed Mobility: Supine to Sit;Sit to Supine     Supine to sit: Mod assist;HOB elevated Sit to supine: Max assist   General bed mobility comments: not tested; pt sleepy and wishes to remain in bed  Transfers Overall transfer level: Needs assistance Equipment used: Rolling walker (2 wheeled) Transfers: Sit to/from Stand Sit to Stand: Max assist         General transfer comment: Pt requires cues for hand placement and for knee flexion with feet flat on floor before attempting to stand.  Pt unable to sufficiently bend L knee due to pain.  Max assist to boost to standing at which point the pt demonstrates a flexed posture which she is unable to correct on either of her 2 sit<>stand attempts despite max verbal and tactile cues as well as demonstration.  She  demonstrates a posterior bias and demands to sit.  Ambulation/Gait             General Gait Details: unable to ambulate at this time   Stairs            Wheelchair Mobility    Modified Rankin (Stroke Patients Only)       Balance Overall balance assessment: Needs assistance;History of Falls Sitting-balance support: Bilateral upper extremity supported;Feet supported Sitting balance-Leahy Scale: Poor Sitting balance - Comments: Pt relies on at least 1UE support when sitting EOB as she demonstrates a posterior bias as she was unable to achieve L foot flat on floor due to limited L knee flexion because of pain. Postural control: Posterior lean Standing balance support: Bilateral upper extremity supported;During functional activity Standing balance-Leahy Scale: Poor Standing balance comment: Pt requires physical assist and RW to maintain upright balance. She braces the back of BLEs against side of bed due to instability.                            Cognition Arousal/Alertness: Awake/alert Behavior During Therapy: WFL for tasks assessed/performed Overall Cognitive Status: History of cognitive impairments - at baseline                                        Exercises General Exercises - Lower Extremity Ankle Circles/Pumps: AROM;Both;20 reps;Supine Quad Sets: Strengthening;Both;20 reps;Supine Gluteal Sets:  Strengthening;Both;20 reps;Supine Short Arc Quad: AROM;Both;20 reps;Supine Long Arc Quad: AROM;Left;5 reps;Seated;Limitations Long [employer] Limitations: Does not move through full range due to limited L knee flexion because of pain Heel Slides: AAROM;Left;20 reps;Supine (AROM R) Hip ABduction/ADduction: AAROM;Left;20 reps;Supine (AROM R) Straight Leg Raises: AAROM;Left;10 reps;Supine (strengthening R) Other Exercises Other Exercises: Add'r squeeze Young 20x supine    General Comments General comments (skin integrity, edema, etc.): Pt become  aggravated with this PT during sit<>stand transfers which she apologizes for at the end of the session and attributes to pain.  Pt very appreciative and pleasant toward this PT at end of session.      Pertinent Vitals/Pain Pain Assessment: 0-10 Pain Score: 6  Faces Pain Scale: Hurts worst Pain Location: L hip/thigh Pain Descriptors / Indicators: Aching Pain Intervention(s): Limited activity within patient's tolerance;Monitored during session    Home Living Family/patient expects to be discharged to:: Private residence Living Arrangements: Spouse/significant other Available Help at Discharge: Family Type of Home: House Home Access: Ramped entrance   Home Layout: One level Home Equipment: Environmental consultant - 2 wheels      Prior Function Level of Independence: Needs assistance  Gait / Transfers Assistance Needed: bedroom>bathroom with RW with supervision assist  ADL's / Homemaking Assistance Needed: total assist     PT Goals (current goals can now be found in the care plan section) Acute Rehab PT Goals Patient Stated Goal: Decreased pain PT Goal Formulation: With patient Time For Goal Achievement: 01/13/17 Potential to Achieve Goals: Fair Progress towards PT goals: Progressing toward goals    Frequency    BID      PT Plan Current plan remains appropriate    Co-evaluation              AM-PAC PT "6 Clicks" Daily Activity  Outcome Measure  Difficulty turning over in bed (including adjusting bedclothes, sheets and blankets)?: Total Difficulty moving from lying on back to sitting on the side of the bed? : Total Difficulty sitting down on and standing up from a chair with arms (e.g., wheelchair, bedside commode, etc,.)?: Total Help needed moving to and from a bed to chair (including a wheelchair)?: Total Help needed walking in hospital room?: Total Help needed climbing 3-5 steps with a railing? : Total 6 Click Score: 6    End of Session Equipment Utilized During Treatment:  Gait belt Activity Tolerance: Patient limited by pain;Treatment limited secondary to agitation Patient left: in bed;with call bell/phone within reach;with bed alarm set;with SCD's reapplied Nurse Communication: Mobility status;Patient requests pain meds;Other (comment) (pt needs to void) PT Visit Diagnosis: Unsteadiness on feet (R26.81);Muscle weakness (generalized) (M62.81);History of falling (Z91.81);Adult, failure to thrive (R62.7)     Time: 5409-8119 PT Time Calculation (min) (ACUTE ONLY): 28 min  Charges:  $Therapeutic Exercise: 23-37 mins                    G Codes:      Charlie Pitter, PTA 12/30/2016, 2:58 PM

## 2016-12-30 NOTE — Progress Notes (Addendum)
Occupational Therapy Evaluation Patient Details Name: Ashley Crankeratricia B Bursch MRN: [id number] DOB: [birth year] Today's Date: 12/30/2016    History of present illness Pt. is a 76 y.o. female who [facility] IM nailing repair of a Left Hip Fracture. Pt. PMHx includes: Severe MDD. Pt. has been in the ER since 12/24/2016 with Svere Depression, Dementia.   OT comments  Pt. is a 76 y.o. female who [facility] IM nailing repair of a Left Hip Fracture. Pt. Had an episiode of 7-8/10 chest pain upon arrival, nursing was notified, and was in to assess pt. Pt. presents with hip pain, chest pain, weakness, impaired cognition, and impaired mobility which hinder her ability to complete ADL tasks. Pt. Has been in the ER since 5/29/2017waiting placement for major Depression. Pt. husband reports he will be unable to care for her by himself at home. Pt. Could benefit from SNF upon discharge with continued skilled OT services to work on improving ADL functioning.    Follow Up Recommendations  SNF    Equipment Recommendations       Recommendations for Other Services      Precautions / Restrictions Precautions Precautions: Fall Precaution Comments: fall Restrictions Weight Bearing Restrictions: Yes LLE Weight Bearing: Weight bearing as tolerated              ADL either performed or assessed with clinical judgement   ADL Overall ADL's : Needs assistance/impaired Eating/Feeding: Minimal assistance;Set up   Grooming: Minimal assistance;Set up;Cueing for sequencing   Upper Body Bathing: Total assistance   Lower Body Bathing: Total assistance   Upper Body Dressing : Total assistance   Lower Body Dressing: Total assistance               Functional mobility during ADLs: Total assistance       Vision       Perception     Praxis      Cognition Arousal/Alertness: Awake/alert Behavior During Therapy: WFL for tasks assessed/performed Overall Cognitive Status:  History of cognitive impairments - at baseline                                             Shoulder Instructions       General Comments     Pertinent Vitals/ Pain       Pain Assessment: Faces Pain Score: 6  Faces Pain Scale: Hurts worst Pain Location: Left Hip Pain Descriptors / Indicators: Aching Pain Intervention(s): Limited activity within patient's tolerance;Monitored during session  Home Living Family/patient expects to be discharged to:: Private residence Living Arrangements: Spouse/significant other Available Help at Discharge: Family Type of Home: House Home Access: Ramped entrance     Home Layout: One level     Bathroom Shower/Tub: Chief Strategy OfficerTub/shower unit   Bathroom Toilet: Standard     Home Equipment: Environmental consultantWalker - 2 wheels          Prior Functioning/Environment Level of Independence: Needs assistance  Gait / Transfers Assistance Needed: bedroom>bathroom with RW with supervision assist  ADL's / Homemaking Assistance Needed: total assist Communication / Swallowing Assistance Needed: no difficulties     Frequency  Min 1X/week        Progress Toward Goals  OT Goals(current goals can now be found in the care plan section)     Acute Rehab OT Goals Patient Stated Goal: Decreased pain OT Goal Formulation:  With patient Potential to Achieve Goals: Good  Plan      Co-evaluation                 AM-PAC PT "6 Clicks" Daily Activity     Outcome Measure   Help from another person eating meals?: A Little Help from another person taking care of personal grooming?: A Little Help from another person toileting, which includes using toliet, bedpan, or urinal?: Total Help from another person bathing (including washing, rinsing, drying)?: Total Help from another person to put on and taking off regular upper body clothing?: Total Help from another person to put on and taking off regular lower body clothing?: Total 6 Click Score: 10    End of  Session    OT Visit Diagnosis: Muscle weakness (generalized) (M62.81)   Activity Tolerance Patient tolerated treatment well   Patient Left in bed;with call bell/phone within reach;with bed alarm set   Nurse Communication          Time: 1240-1310 OT Time Calculation (min): 30 min  Charges: OT General Charges $OT Visit: 1 Procedure OT Evaluation $OT Eval Low Complexity: 1 Procedure  Olegario Messier, MS, OTR/L  Olegario Messier, MS, OTR/L 12/30/2016, 1:46 PM

## 2016-12-30 NOTE — Clinical Social Work Note (Signed)
Clinical Social Work Assessment  Patient Details  Name: Ashley Young MRN: [id number] Date of Birth: Oct 13, 1940  Date of referral:  12/30/16               Reason for consult:  Facility Placement                Permission sought to share information with:  Chartered certified accountant granted to share information::  Yes, Verbal Permission Granted  Name::      [place]::   [place]   Relationship::     Contact Information:     Housing/Transportation Living arrangements for the past 2 months:  [place] of Information:  Patient, Spouse Patient Interpreter Needed:  None Criminal Activity/Legal Involvement Pertinent to Current Situation/Hospitalization:  No - Comment as needed Significant Relationships:  Adult Children, Spouse Lives with:  Spouse Do you feel safe going back to the place where you live?  Yes Need for family participation in patient care:  Yes (Comment)  Care giving concerns:  Patient lives in [place] with her husband Ashley Young [phone].    Social Worker assessment / plan:  Holiday representative (CSW) received SNF consult. PT is recommending SNF. CSW discussed case with Dr. Weber Cooks who stated that patient does not meet criteria for inpatient psych admission and is appropriate for rehab at a SNF. Per Dr. Weber Cooks dementia is not patient's primary diagnosis. CSW met with patient and her husband Ashley Young was at bedside. Patient was alert and oriented X4 and was very pleasant throughout assessment. Patient reported that she is "back to her old self and feels better." CSW explained SNF process and that patient's SNF offers will be limited because of her behaviors in the ED. Husband reported that patient's behaviors are much better and he wants to be an advocate for her. Husband reported that he has worked in the Librarian, academic profession for 30 years and is now retired. Husband reported that [facility],  [facility] and [facility] are their top 3 choices. FL2 complete and faxed out. PASARR is pending. Husband is familiar with the PASARR process and understands that patient will be a level 2 and an evaluator will likely come see patient in person. Husband verbalized his understanding.   CSW presented husband and patient bed offers. Patient has only 2 offers, [facility] and [facility]. Per husband he will consider [facility] if he can't get into one of his top 3 choices. [facility], [facility] and [facility] all declined patient however they are now reconsidering and reviewing referral. CSW will continue to follow and assist as needed.   Employment status:  Retired Nurse, adult PT Recommendations:  [place] / Referral to community resources:  [place]  Patient/Family's Response to care:  Patient and her husband are agreeable to [facility] in [place].   Patient/Family's Understanding of and Emotional Response to Diagnosis, Current Treatment, and Prognosis: Patient and her husband were very pleasant and thanked CSW for assistance.   Emotional Assessment Appearance:  Appears stated age Attitude/Demeanor/Rapport:    Affect (typically observed):  Accepting, Adaptable, Pleasant Orientation:  Oriented to Self, Oriented to Place, Oriented to  Time, Oriented to Situation Alcohol / Substance use:  Not Applicable Psych involvement (Current and /or in the community):  Yes (Comment) (Psych has cleared patient. )  Discharge Needs  Concerns to be addressed:  Discharge Planning Concerns Readmission within  the last 30 days:  No Current discharge risk:  Dependent with Mobility Barriers to Discharge:  Continued Medical Work up   [employer], Ashley Beets, LCSW 12/30/2016, 3:56 PM

## 2016-12-30 NOTE — Progress Notes (Signed)
MEDICATION RELATED CONSULT NOTE  Pharmacy Consult for electrolyte monitoring and dosing Indication: hypokalemia  Assessment/Plan: K = 4.8 is WNL. No supplementation needed at this time.   Continue calcium/vitamin D daily supplementation. Will recheck all lytes with AM Labs tomorrow.   Allergies  Allergen Reactions  . Penicillins Swelling and Rash  . Aspirin Other (See Comments)    GI bleed Stomach ulcers, bleeding   . Ferrous Gluconate Nausea Only    Cannot take in IV form  . Iron Polysaccharide Rash  . Nsaids Rash    Severe GI upset and prior ulcer  . Other Rash    Venofen- unknown  . Sulfamethoxazole-Trimethoprim Rash  . Venofer [Iron Sucrose] Rash    Patient Measurements: Height: 5\' 3"  (160 cm) Weight: 87 lb 4.8 oz (39.6 kg) IBW/kg (Calculated) : 52.4  Vital Signs: Temp: 97.8 F (36.6 C) (06/04 0741) Temp Source: Oral (06/04 0741) BP: 116/65 (06/04 0741) Pulse Rate: 114 (06/04 0741) Intake/Output from previous day: 06/03 0701 - 06/04 0700 In: 653.8 [P.O.:240; I.V.:413.8] Out: 300 [Urine:250; Blood:50] Intake/Output from this shift: Total I/O In: 0  Out: 125 [Urine:125]    Cindi CarbonMary M Lexander Tremblay, PharmD, BCPS  12/30/16 10:43 AM

## 2016-12-30 NOTE — Progress Notes (Addendum)
[facility] - [place] at [facility]                                                                                                                                                                                  Patient Demographics   Ashley Young, is a 76 y.o. female, DOB - Apr 18, 1941, RUE:[id number]  Admit date - 12/24/2016   Admitting Physician Adrian Saran, MD  Outpatient Primary MD for the patient is Marisue Ivan, MD   LOS - 3  Subjective: Patient currently little drowsy underwent surgery yesterday and tolerated the procedure  Review of Systems:   CONSTITUTIONAL: No documented fever. No fatigue, weakness. No weight gain, no weight loss.  EYES: No blurry or double vision.  ENT: No tinnitus. No postnasal drip. No redness of the oropharynx.  RESPIRATORY: No cough, no wheeze, no hemoptysis. No dyspnea.  CARDIOVASCULAR: No chest pain. No orthopnea. No palpitations. No syncope.  GASTROINTESTINAL: No nausea, no vomiting or diarrhea. No abdominal pain. No melena or hematochezia.  GENITOURINARY: No dysuria or hematuria.  ENDOCRINE: No polyuria or nocturia. No heat or cold intolerance.  HEMATOLOGY: No anemia. No bruising. No bleeding.  INTEGUMENTARY: No rashes. No lesions.  MUSCULOSKELETAL: No arthritis. No swelling. No gout. Left hip/pelvic region pain NEUROLOGIC: No numbness, tingling, or ataxia. No seizure-type activity.  PSYCHIATRIC: No anxiety. No insomnia. No ADD.    Vitals:   Vitals:   12/30/16 0001 12/30/16 0412 12/30/16 0741 12/30/16 1250  BP: 114/68 118/85 116/65 107/80  Pulse: 99 (!) 107 (!) 114 (!) 119  Resp:  16 14   Temp:  97.6 F (36.4 C) 97.8 F (36.6 C)   TempSrc:  Axillary Oral   SpO2: 100% 98% 94% 94%  Weight:      Height:        Wt Readings from Last 3 Encounters:  12/29/16 87 lb 4.8 oz (39.6 kg)  05/17/16 96 lb 4 oz (43.7 kg)  01/12/16 102 lb (46.3 kg)     Intake/Output Summary (Last 24 hours) at 12/30/16 1359 Last data  filed at 12/30/16 1200  Gross per 24 hour  Intake           413.75 ml  Output              175 ml  Net           238.75 ml    Physical Exam:   GENERAL: Pleasant-appearing in no apparent distress.  HEAD, EYES, EARS, NOSE AND THROAT: Atraumatic, normocephalic. Extraocular muscles are intact. Pupils equal and reactive to light. Sclerae anicteric. No conjunctival injection. No oro-pharyngeal erythema.  NECK: Supple. There is no jugular venous distention. No bruits,  no lymphadenopathy, no thyromegaly.  HEART: Regular rate and rhythm,. No murmurs, no rubs, no clicks.  LUNGS: Clear to auscultation bilaterally. No rales or rhonchi. No wheezes.  ABDOMEN: Soft, flat, nontender, nondistended. Has good bowel sounds. No hepatosplenomegaly appreciated.  EXTREMITIES: No evidence of any cyanosis, clubbing, or peripheral edema.  +2 pedal and radial pulses bilaterally.  NEUROLOGIC: The patient is alert, awake, and oriented x1 with no focal motor or sensory deficits appreciated bilaterally.  SKIN: Moist and warm with no rashes appreciated.  Psych: Not anxious, depressed LN: No inguinal LN enlargement    Antibiotics   Anti-infectives    Start     Dose/Rate Route Frequency Ordered Stop   12/29/16 2230  clindamycin (CLEOCIN) IVPB 600 mg  Status:  Discontinued     600 mg 100 mL/hr over 30 Minutes Intravenous Every 6 hours 12/29/16 1815 12/29/16 2049   12/29/16 2200  clindamycin (CLEOCIN) capsule 600 mg     600 mg Oral Every 6 hours 12/29/16 2049 12/30/16 1224   12/29/16 1600  clindamycin (CLEOCIN) IVPB 600 mg     600 mg 100 mL/hr over 30 Minutes Intravenous  Once 12/29/16 1233 12/29/16 1644      Medications   Scheduled Meds: . amitriptyline  50 mg Oral QHS  . ARIPiprazole  2 mg Oral Daily  . calcium-vitamin D  1 tablet Oral Q breakfast  . docusate sodium  100 mg Oral BID  . enoxaparin (LOVENOX) injection  30 mg Subcutaneous Q24H  . FLUoxetine  30 mg Oral Daily  . latanoprost  1 drop Both Eyes  QHS  . mirtazapine  7.5 mg Oral QHS  . multivitamin with minerals  1 tablet Oral Daily  . pantoprazole  40 mg Oral Daily  . tamsulosin  0.4 mg Oral Daily   Continuous Infusions: . sodium chloride 75 mL/hr at 12/29/16 1829   PRN Meds:.acetaminophen **OR** acetaminophen, alum & mag hydroxide-simeth, bisacodyl, magnesium citrate, magnesium hydroxide, menthol-cetylpyridinium **OR** phenol, morphine injection, ondansetron **OR** ondansetron (ZOFRAN) IV, oxyCODONE, senna-docusate, SUMAtriptan, traMADol   Data Review:   Micro Results Recent Results (from the past 240 hour(s))  Urine culture     Status: None   Collection Time: 12/24/16  4:15 PM  Result Value Ref Range Status   Specimen Description URINE, RANDOM  Final   Special Requests NONE  Final   Culture   Final    NO GROWTH Performed at [facility], [address] [place], [place] [address]    Report Status 12/25/2016 FINAL  Final  Surgical pcr screen     Status: None   Collection Time: 12/29/16 12:26 PM  Result Value Ref Range Status   MRSA, PCR NEGATIVE NEGATIVE Final   Staphylococcus aureus NEGATIVE NEGATIVE Final    Comment:        The Xpert SA Assay (FDA approved for NASAL specimens in patients over 81 years of age), is one component of a comprehensive surveillance program.  Test performance has been validated by [facility] for patients greater than or equal to 72 year old. It is not intended to diagnose infection nor to guide or monitor treatment.     Radiology Reports Dg Pelvis 1-2 Views  Result Date: 12/26/2016 CLINICAL DATA:  Bilateral hip pain EXAM: PELVIS - 1-2 VIEW COMPARISON:  01/12/2016 FINDINGS: Bones are diffusely demineralized. Patient is status post sacral plasty. Age-indeterminate fractures right superior inferior pubic rami evident. Three cannulated compression screws transfix a right femoral neck fracture, similar to prior. IMPRESSION:  Osteopenia with age indeterminate right superior and  inferior pubic rami fractures. Electronically Signed   By: Kennith CenterEric  Mansell M.D.   On: 12/26/2016 17:17   Ct Pelvis Wo Contrast  Result Date: 12/28/2016 CLINICAL DATA:  Recent fall with left hip pain, initial encounter EXAM: CT PELVIS WITHOUT CONTRAST TECHNIQUE: Multidetector CT imaging of the pelvis was performed following the standard protocol without intravenous contrast. COMPARISON:  12/26/2016 FINDINGS: Urinary Tract: Tiny nonobstructing right renal stone is noted. The bladder is well distended. Bowel:  Visualized bowel appears within normal limits. Vascular/Lymphatic: Diffuse vascular calcifications are noted. No significant lymphadenopathy is noted. Reproductive:  No mass or other significant abnormality Other:  None. Musculoskeletal: Mild degenerative changes of lumbar spine are seen. Changes of prior vertebral augmentation are noted at L5. Healing pubic rami fractures are noted on the right both in the superior and inferior pubic ramus. Prior pinning of the proximal right femur is seen. There is an incomplete fracture of the proximal left femur involving the femoral neck at its junction with the intratrochanteric region. A small amount of joint fluid is noted. Mild subcutaneous edema from the injury is noted. No other fractures are seen. IMPRESSION: Incomplete fracture of the left femoral neck at its junction with the intratrochanteric region. This is best visualized on coronal reconstructions images of series 6. Healing right pubic rami fractures as well as prior pinning of the proximal right femur. Tiny nonobstructing right renal stone. Electronically Signed   By: Alcide CleverMark  Lukens M.D.   On: 12/28/2016 14:53   Dg Chest Portable 1 View  Result Date: 12/24/2016 CLINICAL DATA:  76 y/o  F; agitation. EXAM: PORTABLE CHEST 1 VIEW COMPARISON:  11/09/2014 chest radiograph FINDINGS: Stable borderline cardiomegaly. Aortic atherosclerosis with calcification. Surgical clips project over gastroesophageal junction.  Stable hyperinflated lungs. Stable chronic bronchitic changes in the lung bases. No new focal consolidation, effusion, or pneumothorax. Bones are unremarkable. IMPRESSION: No active disease. Electronically Signed   By: Mitzi HansenLance  Furusawa-Stratton M.D.   On: 12/24/2016 16:33   Dg Hip Operative Unilat W Or W/o Pelvis Left  Result Date: 12/29/2016 CLINICAL DATA:  Left hip nailing EXAM: OPERATIVE LEFT HIP (WITH PELVIS IF PERFORMED) 3 VIEWS TECHNIQUE: Fluoroscopic spot image(s) were submitted for interpretation post-operatively. COMPARISON:  None. FINDINGS: Changes of internal fixation across the left see femoral neck fracture. Anatomic alignment. No hardware complicating feature. IMPRESSION: Internal fixation across the left femoral neck fracture. No complicating feature. Electronically Signed   By: Charlett NoseKevin  Dover M.D.   On: 12/29/2016 17:18     CBC  Recent Labs Lab 12/24/16 1441 12/27/16 1859 12/28/16 0703 12/30/16 0451  WBC 8.2 6.6 6.3 10.4  HGB 12.1 10.8* 11.2* 12.2  HCT 36.7 33.4* 33.6* 37.6  PLT 326 259 226 306  MCV 85.9 86.7 87.2 86.7  MCH 28.2 28.1 29.0 28.1  MCHC 32.8 32.5 33.2 32.4  RDW 15.6* 15.2* 15.2* 15.1*    Chemistries   Recent Labs Lab 12/24/16 1441 12/27/16 1537 12/27/16 1859 12/28/16 0703 12/29/16 0340 12/29/16 1258 12/30/16 0451  NA 135 138  --  137 138  --  133*  K 3.7 2.5*  --  3.2* 5.7* 5.0 4.8  CL 106 101  --  104 109  --  96*  CO2 22 29  --  26 28  --  29  GLUCOSE 94 94  --  82 83  --  128*  BUN 16 11  --  11 12  --  10  CREATININE 0.69 0.54  0.60 0.45 0.34*  --  0.32*  CALCIUM 8.4* 8.0*  --  7.7* 7.7*  --  8.4*  MG  --   --  1.6* 2.2 2.0  --   --   AST 25 21  --   --   --   --   --   ALT 16 21  --   --   --   --   --   ALKPHOS 134* 113  --   --   --   --   --   BILITOT 0.3 0.6  --   --   --   --   --    ------------------------------------------------------------------------------------------------------------------ estimated creatinine clearance is  36.8 mL/min (A) (by C-G formula based on SCr of 0.32 mg/dL (L)). ------------------------------------------------------------------------------------------------------------------ No results for input(s): HGBA1C in the last 72 hours. ------------------------------------------------------------------------------------------------------------------ No results for input(s): CHOL, HDL, LDLCALC, TRIG, CHOLHDL, LDLDIRECT in the last 72 hours. ------------------------------------------------------------------------------------------------------------------ No results for input(s): TSH, T4TOTAL, T3FREE, THYROIDAB in the last 72 hours.  Invalid input(s): FREET3 ------------------------------------------------------------------------------------------------------------------ No results for input(s): VITAMINB12, FOLATE, FERRITIN, TIBC, IRON, RETICCTPCT in the last 72 hours.  Coagulation profile No results for input(s): INR, PROTIME in the last 168 hours.  No results for input(s): DDIMER in the last 72 hours.  Cardiac Enzymes No results for input(s): CKMB, TROPONINI, MYOGLOBIN in the last 168 hours.  Invalid input(s): CK ------------------------------------------------------------------------------------------------------------------ Invalid input(s): POCBNP    Assessment & Plan  Patient is 76 year old who was actually in the emergency room waiting placement to geriatric psychiatric unit She was noted to have severe hypokalemia and electrolyte disturbances  1. Severe hypokalemia: Now potassium normal   2. Adult failure to thrive with protein calorie malnutrition: Palliative care consult Dietary consultation for calorie count and feeding supplement  3. Severe MDD: Psychiatry consult Continue Prozac, Abilify, Remeron and amitriptyline adjustment per psychiatry   4. Combined systolic and diastolic heart failure: No signs of exacerbation Monitor fluid status on IV fluids.   5. Urinary  retention: Continue tamsulosin  6 GERD: Continue PPI  7. Hip pain CT s/p repalcment     Code Status Orders        Start     Ordered   12/27/16 1831  Full code  Continuous     12/27/16 1831    Code Status History    Date Active Date Inactive Code Status Order ID Comments User Context   12/27/2016  4:36 PM 12/27/2016  6:31 PM DNR [id number]  Adrian Saran, MD ED           Consults  pyschiatry  DVT Prophylaxis  Lovenox    Lab Results  Component Value Date   PLT 306 12/30/2016     Time Spent in minutes  Greater than 50% of time spent in care coordination and counseling patient regarding the condition and plan of care.   Auburn Bilberry M.D on 12/30/2016 at 1:59 PM  Between 7am to 6pm - Pager - [phone]  After 6pm go to [email or web address] - password [email or web address] [facility]  [facility]   Office  [phone]

## 2016-12-30 NOTE — Progress Notes (Addendum)
This Clinical research associatewriter responded to call from OT stating that this pt having chest pain, midsternal. Pt alert and oriented, skin is warm and dry, pt is able to speak in full sentences and is not sob. Pt denied feeling dizzy. This Clinical research associatewriter checked pt's vital signs. Family member at bedside told this Clinical research associatewriter that pt c/o chest pains at home as well, and has had full cardiac workups in the past. Episode reported to pt's nurse, Rachel,Rn. Nurse Practitioner at bedside as well and questioned pt further regarding her chest pain. Plan is for pt to received med for pain control.
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
## 2016-12-30 NOTE — Clinical Social Work Placement (Signed)
   CLINICAL SOCIAL WORK PLACEMENT  NOTE  Date:  12/30/2016  Patient Details  Name: Lamount Crankeratricia B Bhagat MRN: [id number] Date of Birth: [birth year]  Clinical Social Work is seeking post-discharge placement for this patient at the Skilled  Nursing Facility level of care (*CSW will initial, date and re-position this form in  chart as items are completed):  Yes   Patient/family provided with [facility] Clinical Social Work Department's list of facilities offering this level of care within the geographic area requested by the patient (or if unable, by the patient's family).  Yes   Patient/family informed of their freedom to choose among providers that offer the needed level of care, that participate in Medicare, Medicaid or managed care program needed by the patient, have an available bed and are willing to accept the patient.  Yes   Patient/family informed of 's ownership interest in [facility] and [facility], as well as of the fact that they are under no obligation to receive care at these facilities.  PASRR submitted to EDS on 12/27/16     PASRR number received on       Existing PASRR number confirmed on       FL2 transmitted to all facilities in geographic area requested by pt/family on 12/28/16     FL2 transmitted to all facilities within larger geographic area on       Patient informed that his/her managed care company has contracts with or will negotiate with certain facilities, including the following:        Yes   Patient/family informed of bed offers received.  Patient chooses bed at       Physician recommends and patient chooses bed at      Patient to be transferred to   on  .  Patient to be transferred to facility by       Patient family notified on   of transfer.  Name of family member notified:        PHYSICIAN       Additional Comment:    _______________________________________________ Delmus Warwick, Darleen CrockerBailey M, LCSW 12/30/2016, 3:51 PM

## 2016-12-30 NOTE — Progress Notes (Signed)
SNF placement can't be pursued until patient is psychiatrically cleared. The most recent psych MD note states "Disposition: Recommend psychiatric Inpatient admission when medically cleared." Disposition is undetermined. Clinical Social Worker (CSW) will continue to follow and assist as needed.   B[employer] Hughes IncorporatedBailey Valori Hollenkamp, LCSW [phone]

## 2016-12-30 NOTE — Consult Note (Signed)
Consultation Note Date: 12/30/2016   Patient Name: Ashley Young  DOB: 1941/04/24  MRN: [id number]  Age / Sex: 76 y.o., female  PCP: Dion Body, MD Referring Physician: Dustin Flock, MD  Reason for Consultation: Establishing goals of care, hospice  HPI/Patient Profile: 76 y.o. female  with past medical history of dementia (unsure if this has been officially diagnosed), failure to thrive, depression, anemia, mild CHF, migraines, gastric ulcers, gastric stenting with h/o multiple abd surgeries, falls with pelvic fracture Dec 2017 admitted on 12/24/2016 with hypokalemia and dehydration r/t poor intake. Also found to have left hip fracture after fall 12/26/16 and surgically repaired 12/29/16.   Clinical Assessment and Goals of Care: I met today with husband, Suezanne Jacquet, privately as Pat sleeps. Suezanne Jacquet explains to me that his wife has been declining gradually and was labelled "failure to thrive" 2 years ago. However her decline has escalated to the extreme with aggression in the 2 weeks prior to admission. She has been saying "I'm old, I'm dying, just let me die." She has been mostly bed bound of late per choice and has no motivation to live. Family had a difficult time seeking help as she refused to go to the doctor or to the ED (EMS had visited once already). They were initially seeking geropsych admission but while holding in the ED she continued to physically decline and had a fall resulting in left hip fracture (surgical repair yesterday).  Suezanne Jacquet has had one visit with outpatient palliative care as well. They all understand that her condition is severe and will likely have poor outcome. Hospice has been discussed. They understand the complication of dementia and depression and her frailty. They have advance directives including DNR and Suezanne Jacquet says they would not want a feeding tube. However, they would like supportive  medical treatment to see if she may have any improvements in QOL and quantity of life but will consider hospice and comfort care if this is not successful.   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 She has many complaints, 1 being chest pain that has been worked up without any significant findings. She often becomes fixated and recently combative at home. Ben practiced as Investment banker, operational and their son is a Materials engineer. They are very educated and seem to have good reasonable expectations.   Primary Decision Maker HCPOA husband Ben    SUMMARY OF RECOMMENDATIONS   GOALS: - Transition to SNF rehab with close psychiatric follow up for medication management - DNR - No feeding tube - Family wants "to try" and see if we can optimize her medically understanding she very well may continue to decline towards EOL  Code Status/Advance Care Planning:  DNR   Symptom Management:   Anxiety/agitation/depression: Per psych recs. Could consider Zyprexa in these circumstances.   Poor appetite: Continue Remeron. Offer frequent small snacks/meals and foods she enjoys. NO FEEDING TUBE.   Pain: Left hip fracture and repair. After discussion with RN and pain not controlled OxyIR 5 mg  increased from q6h to q4h prn.   Palliative Prophylaxis:   Aspiration, Delirium Protocol, Frequent Pain Assessment, Oral Care and Turn Reposition  Additional Recommendations (Limitations, Scope, Preferences):  No Artificial Feeding  Psycho-social/Spiritual:   Desire for further Chaplaincy support:yes  Additional Recommendations: Caregiving  Support/Resources and Education on Hospice  Prognosis:   Unable to determine but with continued significantly decreased intake very poor.   Discharge Planning: [facility] for rehab with Palliative care service follow-up      Primary Diagnoses: Present on Admission: . Hypokalemia   I have reviewed the medical  record, interviewed the patient and family, and examined the patient. The following aspects are pertinent.  Past Medical History:  Diagnosis Date  . Anemia   . Arthritis   . B12 deficiency   . Blood transfusion without reported diagnosis   . CHF (congestive heart failure) ([place])   . Depression   . Dysrhythmia    TACHYCARDIA  . Edema    MILD OF ANKLES  . Failure to thrive (0-17)   . GERD (gastroesophageal reflux disease)   . Headache    MIGRAINES  . Hypercholesterolemia   . IDA (iron deficiency anemia)   . Microcytic anemia   . Migraine   . Multiple gastric ulcers   . Renal disorder    Multiple episodes of kidney stones.    Social History   Social History  . Marital status: Married    Spouse name: N/A  . Number of children: N/A  . Years of education: N/A   Social History Main Topics  . Smoking status: Former Smoker    Types: Cigarettes    Quit date: 11/29/1993  . Smokeless tobacco: Never Used  . Alcohol use 0.6 oz/week    1 Standard drinks or equivalent per week  . Drug use: No  . Sexual activity: Not Currently    Birth control/ protection: Abstinence   Other Topics Concern  . None   Social History Narrative  . None   History reviewed. No pertinent family history. Scheduled Meds: . amitriptyline  50 mg Oral QHS  . ARIPiprazole  2 mg Oral Daily  . calcium-vitamin D  1 tablet Oral Q breakfast  . docusate sodium  100 mg Oral BID  . enoxaparin (LOVENOX) injection  30 mg Subcutaneous Q24H  . FLUoxetine  30 mg Oral Daily  . latanoprost  1 drop Both Eyes QHS  . mirtazapine  7.5 mg Oral QHS  . multivitamin with minerals  1 tablet Oral Daily  . pantoprazole  40 mg Oral Daily  . tamsulosin  0.4 mg Oral Daily   Continuous Infusions: . sodium chloride 75 mL/hr at 12/29/16 1829   PRN Meds:.acetaminophen **OR** acetaminophen, alum & mag hydroxide-simeth, bisacodyl, magnesium citrate, magnesium hydroxide, menthol-cetylpyridinium **OR** phenol, morphine injection,  ondansetron **OR** ondansetron (ZOFRAN) IV, oxyCODONE, senna-docusate, SUMAtriptan, traMADol Allergies  Allergen Reactions  . Penicillins Swelling and Rash  . Aspirin Other (See Comments)    GI bleed Stomach ulcers, bleeding   . Ferrous Gluconate Nausea Only    Cannot take in IV form  . Iron Polysaccharide Rash  . Nsaids Rash    Severe GI upset and prior ulcer  . Other Rash    Venofen- unknown  . Sulfamethoxazole-Trimethoprim Rash  . Venofer [Iron Sucrose] Rash   Review of Systems  Constitutional: Positive for activity change, appetite change and unexpected weight change.  Respiratory: Negative for shortness of breath.   Neurological: Positive for weakness.    Physical Exam  Constitutional: She appears well-developed. She appears cachectic. She appears ill.  HENT:  Head: Normocephalic and atraumatic.  Cardiovascular: Regular rhythm.  Tachycardia present.   Pulmonary/Chest: Effort normal. No accessory muscle usage. No tachypnea. No respiratory distress.  Abdominal: Soft. Normal appearance.  Neurological: She is alert.  Disoriented to situation  Nursing note and vitals reviewed.   Vital Signs: BP 107/80   Pulse (!) 119   Temp 97.8 F (36.6 C) (Oral)   Resp 14   Ht '5\' 3"'$  (1.6 m)   Wt 39.6 kg (87 lb 4.8 oz)   SpO2 94%   BMI 15.46 kg/m  Pain Assessment: 0-10 POSS *See Group Information*: S-Acceptable,Sleep, easy to arouse Pain Score: 6    SpO2: SpO2: 94 % O2 Device:SpO2: 94 % O2 Flow Rate: .O2 Flow Rate (L/min): 2 L/min  IO: Intake/output summary:  Intake/Output Summary (Last 24 hours) at 12/30/16 1259 Last data filed at 12/30/16 0800  Gross per 24 hour  Intake           413.75 ml  Output              175 ml  Net           238.75 ml    LBM: Last BM Date: 12/26/16 Baseline Weight: Weight: 32.7 kg (72 lb) Most recent weight: Weight: 39.6 kg (87 lb 4.8 oz)     Palliative Assessment/Data:   Flowsheet Rows     Most Recent Value  Intake Tab  Referral  Department  Hospitalist  Unit at Time of Referral  Orthopedic Unit  Palliative Care Primary Diagnosis  Other (Comment) [FTT]  Date Notified  12/27/16  Palliative Care Type  New Palliative care  Reason for referral  Clarify Goals of Care  Date of Admission  12/24/16  # of days IP prior to Palliative referral  3  Clinical Assessment  Psychosocial & Spiritual Assessment  Palliative Care Outcomes      Time In: 1200 Time Out: 1330 Time Total: 69mn Greater than 50%  of this time was spent counseling and coordinating care related to the above assessment and plan.  Signed by: AVinie Sill NP Palliative Medicine Team Pager # [phone](M-F 8a-5p) Team Phone # [phone](Nights/Weekends)

## 2016-12-30 NOTE — Progress Notes (Signed)
   Subjective: 1 Day Post-Op Procedure(s) (LRB): INTRAMEDULLARY (IM) NAIL INTERTROCHANTRIC (Left) Patient reports pain as 8 on 0-10 scale.   Patient is well, and has had no acute complaints or problems Denies any CP, SOB, ABD pain. We will start PT today Plan is to go Skilled nursing facility after hospital stay.  Objective: Vital signs in last 24 hours: Temp:  [97.6 F (36.4 C)-99.2 F (37.3 C)] 97.8 F (36.6 C) (06/04 0741) Pulse Rate:  [96-115] 114 (06/04 0741) Resp:  [14-16] 14 (06/04 0741) BP: (88-118)/(46-85) 116/65 (06/04 0741) SpO2:  [93 %-100 %] 94 % (06/04 0741)  Intake/Output from previous day: 06/03 0701 - 06/04 0700 In: 653.8 [P.O.:240; I.V.:413.8] Out: 300 [Urine:250; Blood:50] Intake/Output this shift: No intake/output data recorded.   Recent Labs  12/27/16 1859 12/28/16 0703 12/30/16 0451  HGB 10.8* 11.2* 12.2    Recent Labs  12/28/16 0703 12/30/16 0451  WBC 6.3 10.4  RBC 3.86 4.34  HCT 33.6* 37.6  PLT 226 306    Recent Labs  12/29/16 0340 12/29/16 1258 12/30/16 0451  NA 138  --  133*  K 5.7* 5.0 4.8  CL 109  --  96*  CO2 28  --  29  BUN 12  --  10  CREATININE 0.34*  --  0.32*  GLUCOSE 83  --  128*  CALCIUM 7.7*  --  8.4*   No results for input(s): LABPT, INR in the last 72 hours.  EXAM General - Patient is Alert, Appropriate and Oriented Extremity - Neurovascular intact Sensation intact distally Intact pulses distally Dorsiflexion/Plantar flexion intact No cellulitis present Compartment soft Dressing - dressing C/D/I and scant drainage Motor Function - intact, moving foot and toes well on exam.   Past Medical History:  Diagnosis Date  . Anemia   . Arthritis   . B12 deficiency   . Blood transfusion without reported diagnosis   . CHF (congestive heart failure) ([facility])   . Depression   . Dysrhythmia    TACHYCARDIA  . Edema    MILD OF ANKLES  . Failure to thrive (0-17)   . GERD (gastroesophageal reflux disease)   .  Headache    MIGRAINES  . Hypercholesterolemia   . IDA (iron deficiency anemia)   . Microcytic anemia   . Migraine   . Multiple gastric ulcers   . Renal disorder    Multiple episodes of kidney stones.     Assessment/Plan:   1 Day Post-Op Procedure(s) (LRB): INTRAMEDULLARY (IM) NAIL INTERTROCHANTRIC (Left) Principal Problem:   Severe recurrent major depression without psychotic features ([facility]) Active Problems:   Failure to thrive (0-17)   Hypokalemia  Estimated body mass index is 15.46 kg/m as calculated from the following:   Height as of this encounter: 5\' 3"  (1.6 m).   Weight as of this encounter: 39.6 kg (87 lb 4.8 oz). Advance diet Up with therapy  Needs BM Labs stable, recheck in the am CM to assist with discharge  DVT Prophylaxis - Lovenox, Foot Pumps and TED hose Weight-Bearing as tolerated to left leg   T. Cranston Neighborhris Rita Prom, PA-C [facility] 12/30/2016, 8:06 AM

## 2016-12-30 NOTE — Progress Notes (Signed)
[facility] rounding unit visited with Pt. Pt was lying on the bed at the time of this visit. Pt stated she was doing much better and that the dc and nursed has been wonderful. [facility], but she declined prayers.     12/30/16 1400  Clinical Encounter Type  Visited With Patient  Visit Type Initial;Spiritual support  Spiritual Encounters  Spiritual Needs Prayer;Other (Comment)

## 2016-12-30 NOTE — Evaluation (Signed)
Physical Therapy Re-Evaluation Patient Details Name: Ashley Young MRN: [id number] DOB: 02/04/1941 Today's Date: 12/30/2016   History of Present Illness  Ashley Young  is a 76 y.o. female with a known history of severe MDD/dementia who Has been in the emergency room since May 29 for severe depression. Plan was for patient to be transferred to geriatric psychiatry facility. She has been evaluated daily by psychiatry.  Pt was found to have L hip fx and is now s/p L IM nail on 12/29/16.      Clinical Impression  Patient is now s/p above surgery resulting in functional limitations due to the deficits listed below (see PT Problem List).  New PT order received and Re-evaluation completed.  Ashley Young presents with limited strength and ROM L hip due to pain and surgery which leads to frustration and agitation when attempting sit<>stand.  She requires mod>max assist for bed mobility and max assist for sit<>stand transfers.  When standing she demonstrates a flexed posture with a posterior lean and is unable to correct this despite max verbal and tactile cues as well as demonstration.  Patient will benefit from skilled PT to increase their independence and safety with mobility to allow discharge to the venue listed below.      Follow Up Recommendations SNF;Supervision/Assistance - 24 hour    Equipment Recommendations  Other (comment) (TBD at next venue of care)    Recommendations for Other Services OT consult     Precautions / Restrictions Precautions Precautions: Fall Restrictions Weight Bearing Restrictions: Yes LLE Weight Bearing: Weight bearing as tolerated      Mobility  Bed Mobility Overal bed mobility: Needs Assistance Bed Mobility: Supine to Sit;Sit to Supine     Supine to sit: Mod assist;HOB elevated Sit to supine: Max assist   General bed mobility comments: Pt requires step by step cues for sequencing, assist to advance LEs to EOB and to use bed pad to scoot pt to  EOB.  Pt uses bed rail and demonstrates posterior lean when coming into sitting, requiring cues for correct posture.  Pt requires max assist for management of LEs and trunk to return to supine.  Transfers Overall transfer level: Needs assistance Equipment used: Rolling walker (2 wheeled) Transfers: Sit to/from Stand Sit to Stand: Max assist         General transfer comment: Pt requires cues for hand placement and for knee flexion with feet flat on floor before attempting to stand.  Pt unable to sufficiently bend L knee due to pain.  Max assist to boost to standing at which point the pt demonstrates a flexed posture which she is unable to correct on either of her 2 sit<>stand attempts despite max verbal and tactile cues as well as demonstration.  She demonstrates a posterior bias and demands to sit.  Ambulation/Gait             General Gait Details: unable to ambulate at this time  Stairs            Wheelchair Mobility    Modified Rankin (Stroke Patients Only)       Balance Overall balance assessment: Needs assistance;History of Falls Sitting-balance support: Bilateral upper extremity supported;Feet supported Sitting balance-Leahy Scale: Poor Sitting balance - Comments: Pt relies on at least 1UE support when sitting EOB as she demonstrates a posterior bias as she was unable to achieve L foot flat on floor due to limited L knee flexion because of pain. Postural control: Posterior lean Standing balance support:  Bilateral upper extremity supported;During functional activity Standing balance-Leahy Scale: Poor Standing balance comment: Pt requires physical assist and RW to maintain upright balance. She braces the back of BLEs against side of bed due to instability.                             Pertinent Vitals/Pain Pain Assessment: Faces Faces Pain Scale: Hurts worst Pain Location: L hip Pain Descriptors / Indicators: Aching Pain Intervention(s): Limited  activity within patient's tolerance;Monitored during session    Home Living Family/patient expects to be discharged to:: Private residence Living Arrangements: Spouse/significant other Available Help at Discharge: Family (spouse) Type of Home: House Home Access: Ramped entrance     Home Layout: One level Home Equipment: Environmental consultant - 2 wheels      Prior Function Level of Independence: Needs assistance   Gait / Transfers Assistance Needed: bedroom>bathroom with RW with supervision assist   ADL's / Homemaking Assistance Needed: total assist        Hand Dominance        Extremity/Trunk Assessment   Upper Extremity Assessment Upper Extremity Assessment: RUE deficits/detail;LUE deficits/detail RUE Deficits / Details: Strength grossly 4/5 LUE Deficits / Details: Strength grossly 3+/5, pt reports h/o LUE injury in her distant past leading to weakness    Lower Extremity Assessment Lower Extremity Assessment: LLE deficits/detail LLE Deficits / Details: Hip flexion 2+/5, knee flexion 2+/5 (limited by pain), otherwise strength grossly 3-/5 LLE: Unable to fully assess due to pain    Cervical / Trunk Assessment Cervical / Trunk Assessment: Kyphotic  Communication   Communication: No difficulties  Cognition Arousal/Alertness: Awake/alert Behavior During Therapy: WFL for tasks assessed/performed Overall Cognitive Status: History of cognitive impairments - at baseline                                        General Comments General comments (skin integrity, edema, etc.): Pt become aggravated with this PT during sit<>stand transfers which she apologizes for at the end of the session and attributes to pain.  Pt very appreciative and pleasant toward this PT at end of session.    Exercises General Exercises - Lower Extremity Ankle Circles/Pumps: Both;10 reps;AROM;Supine Quad Sets: Strengthening;Both;10 reps;Supine Gluteal Sets: Strengthening;Both;10 reps;Supine Long  Arc Quad: AROM;Left;5 reps;Seated;Limitations Long [employer] Limitations: Does not move through full range due to limited L knee flexion because of pain Straight Leg Raises: Both;AAROM;10 reps;Supine   Assessment/Plan    PT Assessment Patient needs continued PT services  PT Problem List Decreased strength;Decreased activity tolerance;Decreased balance;Decreased mobility;Decreased cognition;Decreased knowledge of use of DME;Decreased safety awareness;Pain;Decreased range of motion       PT Treatment Interventions DME instruction;Gait training;Stair training;Functional mobility training;Therapeutic activities;Therapeutic exercise;Balance training;Patient/family education;Neuromuscular re-education;Modalities;Cognitive remediation    PT Goals (Current goals can be found in the Care Plan section)  Acute Rehab PT Goals Patient Stated Goal: Decreased pain PT Goal Formulation: With patient Time For Goal Achievement: 01/13/17 Potential to Achieve Goals: Fair    Frequency BID   Barriers to discharge Decreased caregiver support Husband unable to provide the amount of physical assist pt currently requires    Co-evaluation               AM-PAC PT "6 Clicks" Daily Activity  Outcome Measure Difficulty turning over in bed (including adjusting bedclothes, sheets and blankets)?: Total Difficulty moving from lying  on back to sitting on the side of the bed? : Total Difficulty sitting down on and standing up from a chair with arms (e.g., wheelchair, bedside commode, etc,.)?: Total Help needed moving to and from a bed to chair (including a wheelchair)?: Total Help needed walking in hospital room?: Total Help needed climbing 3-5 steps with a railing? : Total 6 Click Score: 6    End of Session Equipment Utilized During Treatment: Gait belt Activity Tolerance: Patient limited by pain;Treatment limited secondary to agitation Patient left: in bed;with call bell/phone within reach;with bed alarm  set;with SCD's reapplied;with family/visitor present Nurse Communication: Mobility status;Patient requests pain meds;Other (comment) (pt needs to void) PT Visit Diagnosis: Unsteadiness on feet (R26.81);Muscle weakness (generalized) (M62.81);History of falling (Z91.81);Adult, failure to thrive (R62.7)    Time: [id number] PT Time Calculation (min) (ACUTE ONLY): 23 min   Charges:   PT Evaluation $PT Re-evaluation: 1 Procedure PT Treatments $Therapeutic Exercise: 8-22 mins   PT G Codes:        Encarnacion ChuAshley Quindarrius [place] PT, DPT 12/30/2016, 11:56 AM

## 2016-12-31 LAB — BASIC METABOLIC PANEL
ANION GAP: 6 (ref 5–15)
BUN: 13 mg/dL (ref 6–20)
CALCIUM: 8.1 mg/dL — AB (ref 8.9–10.3)
CO2: 30 mmol/L (ref 22–32)
CREATININE: 0.44 mg/dL (ref 0.44–1.00)
Chloride: 97 mmol/L — ABNORMAL LOW (ref 101–111)
GLUCOSE: 84 mg/dL (ref 65–99)
Potassium: 4 mmol/L (ref 3.5–5.1)
Sodium: 133 mmol/L — ABNORMAL LOW (ref 135–145)

## 2016-12-31 LAB — CBC
HCT: 31.5 % — ABNORMAL LOW (ref 35.0–47.0)
Hemoglobin: 10.3 g/dL — ABNORMAL LOW (ref 12.0–16.0)
MCH: 27.9 pg (ref 26.0–34.0)
MCHC: 32.7 g/dL (ref 32.0–36.0)
MCV: 85.6 fL (ref 80.0–100.0)
PLATELETS: 333 10*3/uL (ref 150–440)
RBC: 3.68 MIL/uL — ABNORMAL LOW (ref 3.80–5.20)
RDW: 14.8 % — AB (ref 11.5–14.5)
WBC: 8.9 10*3/uL (ref 3.6–11.0)

## 2016-12-31 LAB — MAGNESIUM: Magnesium: 1.8 mg/dL (ref 1.7–2.4)

## 2016-12-31 LAB — PHOSPHORUS: Phosphorus: 3.9 mg/dL (ref 2.5–4.6)

## 2016-12-31 MED ORDER — ENOXAPARIN SODIUM 40 MG/0.4ML ~~LOC~~ SOLN: 40.0000 mg | SUBCUTANEOUS | 0 refills | Status: DC

## 2016-12-31 MED ORDER — ARIPIPRAZOLE 2 MG PO TABS: 2.0000 mg | ORAL_TABLET | Freq: Every day | ORAL | 0 refills | Status: DC

## 2016-12-31 MED ORDER — FLUOXETINE HCL 10 MG PO CAPS
30.0000 mg | ORAL_CAPSULE | Freq: Every day | ORAL | 3 refills | Status: DC
Start: 2017-01-01 — End: 2018-09-02

## 2016-12-31 MED ORDER — ENSURE ENLIVE PO LIQD
237.0000 mL | Freq: Two times a day (BID) | ORAL | Status: DC
  Administered 2016-12-31: 237 mL via ORAL

## 2016-12-31 MED ORDER — ACETAMINOPHEN 325 MG PO TABS: 650.0000 mg | ORAL_TABLET | Freq: Four times a day (QID) | ORAL | Status: AC | PRN

## 2016-12-31 MED ORDER — OXYCODONE HCL 5 MG PO TABS: 5.0000 mg | ORAL_TABLET | ORAL | 0 refills | Status: DC | PRN

## 2016-12-31 MED ORDER — MIRTAZAPINE 15 MG PO TBDP: 7.5000 mg | ORAL_TABLET | Freq: Every day | ORAL | Status: DC

## 2016-12-31 MED ORDER — ARIPIPRAZOLE 2 MG PO TABS: 2.0000 mg | ORAL_TABLET | Freq: Every day | ORAL | Status: DC

## 2016-12-31 MED ORDER — BOOST / RESOURCE BREEZE PO LIQD
1.0000 | Freq: Three times a day (TID) | ORAL | Status: DC | PRN
Start: 2016-12-31 — End: 2016-12-31

## 2016-12-31 MED ORDER — DOCUSATE SODIUM 100 MG PO CAPS: 100.0000 mg | ORAL_CAPSULE | Freq: Two times a day (BID) | ORAL | 0 refills | Status: DC

## 2016-12-31 NOTE — [place] FL2 (Signed)
[place] MEDICAID FL2 LEVEL OF CARE SCREENING TOOL     IDENTIFICATION  Patient Name: Ashley Young Birthdate: 06-Mar-1941 Sex: female Admission Date (Current Location): 12/24/2016  [place] and [place] Number:  Chiropodist and Address:  [facility], [address], [place], [place] [address]      Provider Number: [id number]  Attending Physician Name and Address:  Auburn Bilberry, MD  Relative Name and Phone Number:  Cordell Coke (Spouse) [phone]    Current Level of Care: Hospital Recommended Level of Care: Skilled Nursing Facility Prior Approval Number:    Date Approved/Denied:   PASRR Number:  [id number]    Discharge Plan: SNF    Current Diagnoses: Patient Active Problem List   Diagnosis Date Noted  . DNR (do not resuscitate) 12/30/2016  . Hip fracture, left (HCC)   . Palliative care encounter   . Hypokalemia 12/27/2016  . Severe recurrent major depression without psychotic features (HCC) 12/24/2016  . Failure to thrive (0-17) 12/24/2016  . Chronic systolic heart failure (HCC) 01/12/2016  . Borderline diabetes mellitus 01/02/2016  . Pure hypercholesterolemia 01/02/2016  . Goals of care, counseling/discussion 01/02/2016  . B12 deficiency 04/26/2015  . Iron deficiency anemia due to chronic blood loss 04/26/2015  . History of anemia 03/28/2015  . 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 DDD (degenerative disc disease), lumbar 03/09/2014  . Degeneration of intervertebral disc of lumbar region 03/09/2014  . Avitaminosis D 02/09/2014  . Anxiety state 01/04/2014  . Clinical depression 01/04/2014  . Edema leg 01/04/2014  . Recurrent major depression in remission (HCC) 01/04/2014  . Abdominal pain 11/19/2013  . Acquired gastric outlet stenosis 08/14/2013  . Adverse effect of salicylate 08/08/2013  . Anxiety and depression 08/27/2013  . Esophagitis 08/01/2013  . H/O gastrointestinal hemorrhage 08/07/2013  . H/O  gastric ulcer 08/08/2013  . Disorder of nutrition 08/04/2013  . Headache, migraine 08/04/2013  . Calculus of kidney 08/15/2013  . OP (osteoporosis) 08/05/2013    Orientation RESPIRATION BLADDER Height & Weight     Self, Time, Place and Situation   Normal Incontinent Weight: 92 lb 8 oz (42 kg) Height:  5\' 3"  (160 cm)  BEHAVIORAL SYMPTOMS/MOOD NEUROLOGICAL BOWEL NUTRITION STATUS     (NONE) Continent Regular Diet   AMBULATORY STATUS COMMUNICATION OF NEEDS Skin   Extensive Assist Verbally Normal                       Personal Care Assistance Level of Assistance  Bathing, Feeding, Dressing Bathing Assistance: Limited assistance Feeding assistance: Independent Dressing Assistance: Limited assistance     Functional Limitations Info  Hearing, Speech, Sight Sight Info: Adequate Hearing Info: Adequate Speech Info: Adequate    SPECIAL CARE FACTORS FREQUENCY  PT (By licensed PT) and OT      PT Frequency: MIN 5X/WEEK               Contractures Contractures Info: Not present    Additional Factors Info  Code Status, Allergies, Psychotropic Code Status: DNR  Allergies Info: Penicillins, Aspirin, Ferrous Gluconate, Iron Polysaccharide, Nsaids, Other, Sulfamethoxazole-trimethoprim, Venofer Iron Sucrose Psychotropic Info: Prozac         Current Medications (12/31/2016):  This is the current hospital active medication list Current Facility-Administered Medications  Medication Dose Route Frequency Provider Last Rate Last Dose  . 0.9 %  sodium chloride infusion   Intravenous Continuous Kennedy Bucker, MD 75 mL/hr at 12/29/16 1829    . acetaminophen (TYLENOL) tablet 650 mg  650 mg Oral Q6H PRN Adrian SaranMody, Sital, MD       Or  . acetaminophen (TYLENOL) suppository 650 mg  650 mg Rectal Q6H PRN Mody, Sital, MD      . alum & mag hydroxide-simeth (MAALOX/MYLANTA) 200-200-20 MG/5ML suspension 30 mL  30 mL Oral Q4H PRN Kennedy BuckerMenz, Michael, MD      . amitriptyline (ELAVIL) tablet 50 mg  50 mg  Oral QHS Clapacs, Jackquline DenmarkJohn T, MD   50 mg at 12/30/16 2113  . ARIPiprazole (ABILIFY) tablet 2 mg  2 mg Oral Daily Clapacs, John T, MD   2 mg at 12/31/16 1006  . bisacodyl (DULCOLAX) EC tablet 5 mg  5 mg Oral Daily PRN Adrian SaranMody, Sital, MD      . calcium-vitamin D (OSCAL WITH D) 500-200 MG-UNIT per tablet 1 tablet  1 tablet Oral Q breakfast Adrian SaranMody, Sital, MD   1 tablet at 12/31/16 0831  . docusate sodium (COLACE) capsule 100 mg  100 mg Oral BID Kennedy BuckerMenz, Michael, MD   100 mg at 12/31/16 1006  . enoxaparin (LOVENOX) injection 30 mg  30 mg Subcutaneous Q24H Kennedy BuckerMenz, Michael, MD   30 mg at 12/31/16 0831  . feeding supplement (BOOST / RESOURCE BREEZE) liquid 1 Container  1 Container Oral TID BM PRN Ulice BoldParker, Alicia C, NP      . feeding supplement (ENSURE ENLIVE) (ENSURE ENLIVE) liquid 237 mL  237 mL Oral BID BM Ulice BoldParker, Alicia C, NP   237 mL at 12/31/16 1005  . FLUoxetine (PROZAC) capsule 30 mg  30 mg Oral Daily Clapacs, John T, MD   30 mg at 12/31/16 1005  . latanoprost (XALATAN) 0.005 % ophthalmic solution 1 drop  1 drop Both Eyes QHS Clapacs, Jackquline DenmarkJohn T, MD   1 drop at 12/29/16 2129  . magnesium citrate solution 1 Bottle  1 Bottle Oral Once PRN Kennedy BuckerMenz, Michael, MD      . magnesium hydroxide (MILK OF MAGNESIA) suspension 30 mL  30 mL Oral Daily PRN Kennedy BuckerMenz, Michael, MD      . menthol-cetylpyridinium (CEPACOL) lozenge 3 mg  1 lozenge Oral PRN Kennedy BuckerMenz, Michael, MD       Or  . phenol (CHLORASEPTIC) mouth spray 1 spray  1 spray Mouth/Throat PRN Kennedy BuckerMenz, Michael, MD      . mirtazapine (REMERON SOL-TAB) disintegrating tablet 7.5 mg  7.5 mg Oral QHS Clapacs, John T, MD   7.5 mg at 12/30/16 2113  . morphine 2 MG/ML injection 0.5 mg  0.5 mg Intravenous Q2H PRN Kennedy BuckerMenz, Michael, MD      . multivitamin with minerals tablet 1 tablet  1 tablet Oral Daily Adrian SaranMody, Sital, MD   1 tablet at 12/30/16 0801  . ondansetron (ZOFRAN) tablet 4 mg  4 mg Oral Q6H PRN Adrian SaranMody, Sital, MD       Or  . ondansetron (ZOFRAN) injection 4 mg  4 mg Intravenous Q6H PRN Mody,  Sital, MD      . oxyCODONE (Oxy IR/ROXICODONE) immediate release tablet 5 mg  5 mg Oral Q4H PRN Ulice BoldParker, Alicia C, NP   5 mg at 12/31/16 0831  . pantoprazole (PROTONIX) EC tablet 40 mg  40 mg Oral Daily Clapacs, Jackquline DenmarkJohn T, MD   40 mg at 12/31/16 1006  . senna-docusate (Senokot-S) tablet 1 tablet  1 tablet Oral QHS PRN Adrian SaranMody, Sital, MD      . SUMAtriptan (IMITREX) tablet 100 mg  100 mg Oral Q2H PRN Clapacs, John T, MD   100 mg at 12/25/16 1145  .  tamsulosin (FLOMAX) capsule 0.4 mg  0.4 mg Oral Daily Mody, Sital, MD   0.4 mg at 12/31/16 1005  . traMADol (ULTRAM) tablet 50 mg  50 mg Oral Q6H PRN Sharman Cheek, MD   50 mg at 12/30/16 2113     Discharge Medications: Please see discharge summary for a list of discharge medications.  Relevant Imaging Results:  Relevant Lab Results:   Additional Information SS # [id number]  Janissa Bertram, Darleen Crocker, LCSW

## 2016-12-31 NOTE — Progress Notes (Signed)
Occupational Therapy Treatment Patient Details Name: Ashley Young MRN: [id number] DOB: 1941-01-14 Today's Date: 12/31/2016    History of present illness Pt. is a 76 y.o. female who was admitted to [facility] for IM nailing repair of a Left Hip Fracture. Pt. PMHx includes: Severe MDD. Pt. has been in the ER since 12/24/2016 with Svere Depression, Dementia.   OT comments  Pt seen for OT treatment session this date. Pt presents with minimal pain at rest at start of session, increasing to 8/10 after bed mobility and sitting EOB for approx 15 minutes during grooming tasks. Pt able to comb hair with min assist for tangles while sitting EOB with supervision to min guard for sitting balance, as pt fatigues quickly and limited also by L hip pain. Pt able to perform supine to sit EOB with mod assist to swing BLE EOB after she was able to sequentially shift one leg at a time several inches towards the EOB, and requiring max assist for sit to supine back to bed due to fatigue/pain. Pt very sweet, eager to work with therapy this date. MD notified pt of goal to go to STR today. Continue to progress towards goals while in the hospital. Pt continues to benefit from skilled OT services to address impairments and functional deficits in functional mobility and self care in order to maximize return to PLOF and minimize future falls risk.    Follow Up Recommendations  SNF    Equipment Recommendations  Other (comment) (defer to next venue of care)    Recommendations for Other Services      Precautions / Restrictions Precautions Precautions: Fall Restrictions Weight Bearing Restrictions: Yes LLE Weight Bearing: Weight bearing as tolerated       Mobility Bed Mobility Overal bed mobility: Needs Assistance Bed Mobility: Supine to Sit;Sit to Supine     Supine to sit: Mod assist;HOB elevated Sit to supine: Max assist      Transfers Overall transfer level: Needs assistance Equipment used: Rolling walker  (2 wheeled) Transfers: Sit to/from Stand Sit to Stand: Max assist         General transfer comment: max cues for hand placement, max assist to boost to standing with flexed posture which she was unable to correct with verbal cues and tactile cues, with significant posterior lean, only able to stand <30 seconds    Balance Overall balance assessment: Needs assistance;History of Falls Sitting-balance support: Single extremity supported;Feet supported Sitting balance-Leahy Scale: Fair Sitting balance - Comments: pt required at least 1 UE support on EOB while sitting and combing her hair with slight L and posterior lean after sitting for approx 5 minutes  Postural control: Left lateral lean;Posterior lean Standing balance support: Bilateral upper extremity supported;During functional activity Standing balance-Leahy Scale: Poor Standing balance comment: Pt continues to require physical assist and RW to maintain upright balance. She braces the back of BLEs against side of bed due to instability.                           ADL either performed or assessed with clinical judgement   ADL Overall ADL's : Needs assistance/impaired     Grooming: Sitting;Brushing hair;Minimal assistance Grooming Details (indicate cue type and reason): pt tolerated sitting EOB to comb hair requiring min assist to comb through significant tangles which pt was unable to get out Upper Body Bathing: Moderate assistance;Sitting   Lower Body Bathing: Maximal assistance;Sitting/lateral leans   Upper Body Dressing : Minimal  assistance;Sitting   Lower Body Dressing: Maximal assistance;Sitting/lateral leans                 General ADL Comments: pt demonstrating some improvement with participation in functional self care tasks with improved alertness. Still very limited due to pain, poor activity tolerance, and mild confusion.     Vision Baseline Vision/History:  (had glasses case on tray table but was  not wearing them) Patient Visual Report: No change from baseline Vision Assessment?: No apparent visual deficits   Perception     Praxis      Cognition Arousal/Alertness: Awake/alert Behavior During Therapy: WFL for tasks assessed/performed Overall Cognitive Status: History of cognitive impairments - at baseline                                 General Comments: some slight difficulty with word finding        Exercises     Shoulder Instructions       General Comments      Pertinent Vitals/ Pain       Pain Assessment: 0-10 Pain Score: 8  Pain Location: L hip/thigh Pain Descriptors / Indicators: Aching Pain Intervention(s): Limited activity within patient's tolerance;Monitored during session;Repositioned  Home Living                                          Prior Functioning/Environment              Frequency  Min 1X/week        Progress Toward Goals  OT Goals(current goals can now be found in the care plan section)  Progress towards OT goals: Progressing toward goals (very slow progress but with improved alertness, motivation to participate)  Acute Rehab OT Goals Patient Stated Goal: Decreased pain OT Goal Formulation: With patient Potential to Achieve Goals: Good  Plan Discharge plan remains appropriate;Frequency remains appropriate    Co-evaluation                 AM-PAC PT "6 Clicks" Daily Activity     Outcome Measure   Help from another person eating meals?: A Little Help from another person taking care of personal grooming?: A Little Help from another person toileting, which includes using toliet, bedpan, or urinal?: A Lot Help from another person bathing (including washing, rinsing, drying)?: A Lot Help from another person to put on and taking off regular upper body clothing?: A Lot Help from another person to put on and taking off regular lower body clothing?: A Lot 6 Click Score: 14    End of  Session Equipment Utilized During Treatment: Gait belt;Rolling walker  OT Visit Diagnosis: Muscle weakness (generalized) (M62.81);Pain Pain - Right/Left: Left Pain - part of body: Hip   Activity Tolerance Patient limited by pain;Patient limited by fatigue   Patient Left in bed;with call bell/phone within reach;with bed alarm set;with family/visitor present;with nursing/sitter in room;with SCD's reapplied   Nurse Communication          Time: [id number] OT Time Calculation (min): 32 min  Charges: OT General Charges $OT Visit: 1 Procedure OT Treatments $Self Care/Home Management : 23-37 mins  Richrd PrimeJamie Stiller, MPH, MS, OTR/L ascom [phone] 12/31/16, 10:59 AM

## 2016-12-31 NOTE — Progress Notes (Signed)
Physical Therapy Treatment Patient Details Name: Ashley Young MRN: [id number] DOB: 21-Apr-1941 Today's Date: 12/31/2016    History of Present Illness Pt. is a 76 y.o. female who was admitted to [facility] for IM nailing repair of a Left Hip Fracture. Pt. PMHx includes: Severe MDD. Pt. has been in the ER since 12/24/2016 with Svere Depression, Dementia.    PT Comments    Pt agreeable to PT; reports fatigue and 6/10 pain Left lower extremity/hip with movement. Initial heart rate 117; checked throughout session, reaching 120 beats per minute during seated/transfer to chair activity. Pt participates in supine exercises with assist as needed. Improving mobility to Mod A for bed mobility and transfers/short ambulation to the chair. Pt continues to demonstrate anxious behaviors at times, but with encouragement and recognition of successes, pt decreases anxiety and able to demonstrate improved ability. Pt received up in the chair. Plan to see pt this afternoon for continued progress of strength, endurance and stand tolerance to improve functional mobility.     Follow Up Recommendations  SNF     Equipment Recommendations       Recommendations for Other Services       Precautions / Restrictions Precautions Precautions: Fall Restrictions Weight Bearing Restrictions: Yes LLE Weight Bearing: Weight bearing as tolerated    Mobility  Bed Mobility Overal bed mobility: Needs Assistance Bed Mobility: Supine to Sit     Supine to sit: Mod assist;HOB elevated Sit to supine: Max assist   General bed mobility comments: Requires increased cueing and encouragement. Assist for bridging to edge of bed, LEs and trunk, but gives good effort with cues.  Transfers Overall transfer level: Needs assistance Equipment used: Rolling walker (2 wheeled) Transfers: Sit to/from Stand Sit to Stand: Mod assist         General transfer comment: Cues for hand placement and foot placement. Performed 2x. Initial  attempt, pt becomes anxious immediately upon stand wishing to sit. Increased time/explanation and encouragement on process and value of transfer, as pt intially feels this is "stupid and too difficult of a task" Improved ability and decreased level of anxiety with second attempt  Ambulation/Gait Ambulation/Gait assistance: Mod assist Ambulation Distance (Feet): 2 Feet Assistive device: Rolling walker (2 wheeled) Gait Pattern/deviations: Step-to pattern   Gait velocity interpretation: <1.8 ft/sec, indicative of risk for recurrent falls General Gait Details: short steps with decreased clearande especially LLE. Posterior lean. Good overall  effort with encouragement and assist.   Stairs            Wheelchair Mobility    Modified Rankin (Stroke Patients Only)       Balance Overall balance assessment: Needs assistance;History of Falls Sitting-balance support: Single extremity supported;Feet supported Sitting balance-Leahy Scale: Fair Sitting balance - Comments: pt required at least 1 UE support on EOB while sitting and combing her hair with slight L and posterior lean after sitting for approx 5 minutes  Postural control: Left lateral lean;Posterior lean Standing balance support: Bilateral upper extremity supported;During functional activity Standing balance-Leahy Scale: Poor Standing balance comment: Pt continues to require physical assist and RW to maintain upright balance. She braces the back of BLEs against side of bed due to instability.                            Cognition Arousal/Alertness: Awake/alert Behavior During Therapy: WFL for tasks assessed/performed Overall Cognitive Status: History of cognitive impairments - at baseline  General Comments: Feels memory is worse post surgery      Exercises General Exercises - Lower Extremity Ankle Circles/Pumps: AROM;Both;20 reps;Supine Quad Sets: Strengthening;Both;10  reps;Supine Gluteal Sets: Strengthening;Both;10 reps;Supine Short Arc Quad: AROM;Both;10 reps;Supine Heel Slides: AAROM;Left;10 reps;Supine (AROM R) Hip ABduction/ADduction: AAROM;Left;20 reps;Supine (AROM R) Straight Leg Raises: AAROM;Left;10 reps;Supine (strengthening R)    General Comments        Pertinent Vitals/Pain Pain Assessment: 0-10 Pain Score: 6  Pain Location: L hip/thigh Pain Descriptors / Indicators: Aching Pain Intervention(s): Monitored during session;Premedicated before session;Repositioned    Home Living                      Prior Function            PT Goals (current goals can now be found in the care plan section) Acute Rehab PT Goals Patient Stated Goal: Decreased pain Progress towards PT goals: Progressing toward goals    Frequency    BID      PT Plan Current plan remains appropriate    Co-evaluation              AM-PAC PT "6 Clicks" Daily Activity  Outcome Measure  Difficulty turning over in bed (including adjusting bedclothes, sheets and blankets)?: A Lot Difficulty moving from lying on back to sitting on the side of the bed? : Total Difficulty sitting down on and standing up from a chair with arms (e.g., wheelchair, bedside commode, etc,.)?: Total Help needed moving to and from a bed to chair (including a wheelchair)?: A Lot Help needed walking in hospital room?: A Lot Help needed climbing 3-5 steps with a railing? : Total 6 Click Score: 9    End of Session Equipment Utilized During Treatment: Gait belt Activity Tolerance: Patient tolerated treatment well Patient left: in chair;with call bell/phone within reach;with chair alarm set;with SCD's reapplied   PT Visit Diagnosis: Unsteadiness on feet (R26.81);Muscle weakness (generalized) (M62.81);History of falling (Z91.81);Adult, failure to thrive (R62.7)     Time: 9604-5409 PT Time Calculation (min) (ACUTE ONLY): 32 min  Charges:  $Gait Training: 8-22  mins $Therapeutic Exercise: 8-22 mins                    G Codes:        Scot Dock, PTA 12/31/2016, 12:00 PM

## 2016-12-31 NOTE — Progress Notes (Signed)
Report Called to Ms. Barb at [facility], Patient given pain and nausea medicine, vitals taken and patient prepared for transport. EMS called to transport patient to facility.

## 2016-12-31 NOTE — Progress Notes (Signed)
[facility] reviewed most recent psych note and made a bed offer for short term rehab. Clinical Child psychotherapistocial Worker (CSW) presented bed offer to patient's husband Terrilee FilesBen Doble and he accepted it. PASARR has been received, [id number] E expires on 01/29/2017. [facility] can [facility] when medically [facility]. CSW will continue to follow and assist as needed.   Baker Hughes IncorporatedBailey Sopheap Boehle, LCSW [phone]

## 2016-12-31 NOTE — Care Management Important Message (Signed)
Important Message  Patient Details  Name: Lamount Crankeratricia B Mustin MRN: [id number] Date of Birth: [birth year]   Medicare Important Message Given:  Yes    Marily MemosLisa M Psalm Arman, RN 12/31/2016, 11:59 AM

## 2016-12-31 NOTE — Progress Notes (Signed)
Patient is medically stable for [facility]. Per Sue LushAndrea admissions coordinator at [facility] can come today to room 225. RN will call report at [phone] and arrange EMS for transport. Clinical Child psychotherapistocial Worker (CSW) sent D/C orders [facility] via [facility]. PASARR has been received. Palliative care to follow at facility. Clydie BraunKaren hospice liaison is aware of above. Patient is aware of above. Patient's husband Ashley Young is aware of above. Please reconsult if future social work needs arise. CSW signing off.   [employer]orporatedBailey Tej Murdaugh, LCSW [phone]

## 2016-12-31 NOTE — Discharge Instructions (Signed)
[facility] [place] [facility]  DIET:  Cardiac diet  DISCHARGE CONDITION:  Stable  ACTIVITY:  Activity as tolerated  OXYGEN:  Home Oxygen: No.   Oxygen Delivery: room air  DISCHARGE LOCATION:  nursing home    ADDITIONAL DISCHARGE INSTRUCTION: Follow up with [facility] in 2 weeks for staple removal Lovenox 40 mg SQ daily x 14 days TED hose x 6 weeks BLE. Remove at night.  If you experience worsening of your admission symptoms, develop shortness of breath, life threatening emergency, suicidal or homicidal thoughts you must seek medical attention immediately by calling 911 or calling your MD immediately  if symptoms less severe.  You Must read complete instructions/literature along with all the possible adverse reactions/side effects for all the Medicines you take and that have been prescribed to you. Take any new Medicines after you have completely understood and accpet all the possible adverse reactions/side effects.   Please note  You were cared for by a hospitalist during your hospital stay. If you have any questions about your discharge medications or the care you received while you were in the hospital after you are discharged, you can call the unit and asked to speak with the hospitalist on call if the hospitalist that took care of you is not available. Once you are discharged, your primary care physician will handle any further medical issues. Please note that NO REFILLS for any discharge medications will be authorized once you are discharged, as it is imperative that you return to your primary care physician (or establish a relationship with a primary care physician if you do not have one) for your aftercare needs so that they can reassess your need for medications and monitor your lab values.

## 2016-12-31 NOTE — Progress Notes (Signed)
   Subjective: 2 Days Post-Op Procedure(s) (LRB): INTRAMEDULLARY (IM) NAIL INTERTROCHANTRIC (Left) Patient reports pain as moderate.   Patient is well, and has had no acute complaints or problems  Denies any CP, SOB, ABD pain. Husband states patient is not eating or drinking much. We will continue PT today Plan is to go Skilled nursing facility after hospital stay.  Objective: Vital signs in last 24 hours: Temp:  [97.9 F (36.6 C)] 97.9 F (36.6 C) (06/04 2340) Pulse Rate:  [110-119] 110 (06/04 2340) Resp:  [16-20] 20 (06/04 2340) BP: (107-117)/(55-80) 117/60 (06/04 2340) SpO2:  [92 %-95 %] 95 % (06/04 1416) Weight:  [42 kg (92 lb 8 oz)] 42 kg (92 lb 8 oz) (06/05 0436)  Intake/Output from previous day: 06/04 0701 - 06/05 0700 In: 0  Out: 125 [Urine:125] Intake/Output this shift: No intake/output data recorded.   Recent Labs  12/30/16 0451 12/31/16 0407  HGB 12.2 10.3*    Recent Labs  12/30/16 0451 12/31/16 0407  WBC 10.4 8.9  RBC 4.34 3.68*  HCT 37.6 31.5*  PLT 306 333    Recent Labs  12/30/16 0451 12/31/16 0407  NA 133* 133*  K 4.8 4.0  CL 96* 97*  CO2 29 30  BUN 10 13  CREATININE 0.32* 0.44  GLUCOSE 128* 84  CALCIUM 8.4* 8.1*   No results for input(s): LABPT, INR in the last 72 hours.  EXAM General - Patient is Alert, Appropriate and Oriented Extremity - Neurovascular intact Sensation intact distally Intact pulses distally Dorsiflexion/Plantar flexion intact No cellulitis present Compartment soft Dressing - dressing C/D/I and scant drainage Motor Function - intact, moving foot and toes well on exam.   Past Medical History:  Diagnosis Date  . Anemia   . Arthritis   . B12 deficiency   . Blood transfusion without reported diagnosis   . CHF (congestive heart failure) (HCC)   . Depression   . Dysrhythmia    TACHYCARDIA  . Edema    MILD OF ANKLES  . Failure to thrive (0-17)   . GERD (gastroesophageal reflux disease)   . Headache    MIGRAINES  . Hypercholesterolemia   . IDA (iron deficiency anemia)   . Microcytic anemia   . Migraine   . Multiple gastric ulcers   . Renal disorder    Multiple episodes of kidney stones.     Assessment/Plan:   2 Days Post-Op Procedure(s) (LRB): INTRAMEDULLARY (IM) NAIL INTERTROCHANTRIC (Left) Principal Problem:   Severe recurrent major depression without psychotic features (HCC) Active Problems:   Failure to thrive (0-17)   Hypokalemia   Hip fracture, left (HCC)   Palliative care encounter   DNR (do not resuscitate)   Acute post op blood loss anemia   Estimated body mass index is 16.39 kg/m as calculated from the following:   Height as of this encounter: 5\' 3"  (1.6 m).   Weight as of this encounter: 42 kg (92 lb 8 oz). Advance diet Up with therapy  Needs BM Acute post op blood loss anemia - Hgb 10.3, stable CM to assist with discharge to SNF [facility] in 2 weeks for staple removal Lovenox 40 mg SQ daily x 14 days TED hose x 6 weeks BLE. Remove at night.  DVT Prophylaxis - Lovenox, Foot Pumps and TED hose Weight-Bearing as tolerated to left leg   T. Cranston Neighborhris Gaines, PA-C [facility] 12/31/2016, 8:24 AM
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
## 2016-12-31 NOTE — Progress Notes (Signed)
New referral for Palliative to follow at [facility] received from [facility]. Please note patient was followed by HOME palliative prior to admission. Patient information faxed to referral. Thank you. Dayna BarkerKaren Robertson RN, BSN, [facility] [place], hospital Liaison [phone] c

## 2016-12-31 NOTE — Clinical Social Work Placement (Signed)
   CLINICAL SOCIAL WORK PLACEMENT  NOTE  Date:  12/31/2016  Patient Details  Name: Ashley Crankeratricia B Person MRN: [id number] Date of Birth: Dec 11, 1940  Clinical Social Work is seeking post-discharge placement for this patient at the Skilled  Nursing Facility level of care (*CSW will initial, date and re-position this form in  chart as items are completed):  Yes   Patient/family provided with [facility] Clinical Social Work Department's list of facilities offering this level of care within the geographic area requested by the patient (or if unable, by the patient's family).  Yes   Patient/family informed of their freedom to choose among providers that offer the needed level of care, that participate in Medicare, Medicaid or managed care program needed by the patient, have an available bed and are willing to accept the patient.  Yes   Patient/family informed of [facility]'s ownership interest in [facility] and [facility], as well as of the fact that they are under no obligation to receive care at these facilities.  PASRR submitted to EDS on 12/27/16     PASRR number received on 12/31/16     Existing PASRR number confirmed on       FL2 transmitted to all facilities in geographic area requested by pt/family on 12/28/16     FL2 transmitted to all facilities within larger geographic area on       Patient informed that his/her managed care company has contracts with or will negotiate with certain facilities, including the following:        Yes   Patient/family informed of bed offers received.  Patient chooses bed at  [facility] )     Physician recommends and patient chooses bed at      Patient to be transferred to  [facility]([place] ) on 12/31/16.  Patient to be transferred to facility by  [facility] )     Patient family notified on 12/31/16 of transfer.  Name of family member notified:   (Patient's husband [name] is aware of D/C today. )     PHYSICIAN        Additional Comment:    _______________________________________________ Nancie Bocanegra, Darleen CrockerBailey M, LCSW 12/31/2016, 12:27 PM

## 2016-12-31 NOTE — Discharge Summary (Addendum)
[facility]  Ashley Young, 76 y.o., DOB November 13, 1940, MRN [id number]. Admission date: 12/24/2016 Discharge Date 12/31/2016 Primary MD Marisue Ivan, MD Admitting Physician Adrian Saran, MD  Admission Diagnosis  Agitation [R45.1] Dementia with behavioral disturbance, unspecified dementia type [F03.91]  Discharge Diagnosis   Principal Problem:   Severe recurrent major depression with psychotic features (HCC)   Failure to thrive (0-17)   Hypokalemia   Hip fracture, left (HCC)   Palliative care encounter   DNR (do not resuscitate)   Hyperlipidemia   Migraines  Failure to thrive  Previous history of congestive heart failure type unknown        Hospital Course Ashley Young  is a 76 y.o. female with a known history of severe MDD/dementia who Has been in the emergency room since May 29 for severe depression. Plan was for patient to be transferred to geriatric psychiatry facility. She has been evaluated daily by psychiatry.She was admitted today due to poor by mouth intake and severe hypokalemia. Patient also had a fall while in the ED. She was complaining of severe hip pain. So she underwent a CT scan of the hip which showed left hip fracture intertrochanteric. She underwent ORIF with orthopedics. In terms of her depression and psychosis she was seen by psychiatry and her medications were adjusted. Currently mental status is stable. Patient doing much better. Has been cleared by psychiatry that she does not need inpatient admission. She will need aggressive rehabilitation which is currently being arranged.   Lovenox duration 2 wks  TED hose x 6 weeks BLE. Remove at night.   Palliative care team to follow at the facility     Consults  orthopedic surgery  Significant Tests:  See full reports for all details     Dg Pelvis 1-2 Views  Result Date: 12/26/2016 CLINICAL DATA:  Bilateral hip pain EXAM: PELVIS - 1-2 VIEW COMPARISON:   01/12/2016 FINDINGS: Bones are diffusely demineralized. Patient is status post sacral plasty. Age-indeterminate fractures right superior inferior pubic rami evident. Three cannulated compression screws transfix a right femoral neck fracture, similar to prior. IMPRESSION: Osteopenia with age indeterminate right superior and inferior pubic rami fractures. Electronically Signed   By: Kennith Center M.D.   On: 12/26/2016 17:17   Ct Pelvis Wo Contrast  Result Date: 12/28/2016 CLINICAL DATA:  Recent fall with left hip pain, initial encounter EXAM: CT PELVIS WITHOUT CONTRAST TECHNIQUE: Multidetector CT imaging of the pelvis was performed following the standard protocol without intravenous contrast. COMPARISON:  12/26/2016 FINDINGS: Urinary Tract: Tiny nonobstructing right renal stone is noted. The bladder is well distended. Bowel:  Visualized bowel appears within normal limits. Vascular/Lymphatic: Diffuse vascular calcifications are noted. No significant lymphadenopathy is noted. Reproductive:  No mass or other significant abnormality Other:  None. Musculoskeletal: Mild degenerative changes of lumbar spine are seen. Changes of prior vertebral augmentation are noted at L5. Healing pubic rami fractures are noted on the right both in the superior and inferior pubic ramus. Prior pinning of the proximal right femur is seen. There is an incomplete fracture of the proximal left femur involving the femoral neck at its junction with the intratrochanteric region. A small amount of joint fluid is noted. Mild subcutaneous edema from the injury is noted. No other fractures are seen. IMPRESSION: Incomplete fracture of the left femoral neck at its junction with the intratrochanteric region. This is best visualized on coronal reconstructions images of series 6. Healing right pubic rami fractures as well as  prior pinning of the proximal right femur. Tiny nonobstructing right renal stone. Electronically Signed   By: Alcide Clever M.D.    On: 12/28/2016 14:53   Dg Chest Portable 1 View  Result Date: 12/24/2016 CLINICAL DATA:  76 y/o  F; agitation. EXAM: PORTABLE CHEST 1 VIEW COMPARISON:  11/09/2014 chest radiograph FINDINGS: Stable borderline cardiomegaly. Aortic atherosclerosis with calcification. Surgical clips project over gastroesophageal junction. Stable hyperinflated lungs. Stable chronic bronchitic changes in the lung bases. No new focal consolidation, effusion, or pneumothorax. Bones are unremarkable. IMPRESSION: No active disease. Electronically Signed   By: Mitzi Hansen M.D.   On: 12/24/2016 16:33   Dg Hip Operative Unilat W Or W/o Pelvis Left  Result Date: 12/29/2016 CLINICAL DATA:  Left hip nailing EXAM: OPERATIVE LEFT HIP (WITH PELVIS IF PERFORMED) 3 VIEWS TECHNIQUE: Fluoroscopic spot image(s) were submitted for interpretation post-operatively. COMPARISON:  None. FINDINGS: Changes of internal fixation across the left see femoral neck fracture. Anatomic alignment. No hardware complicating feature. IMPRESSION: Internal fixation across the left femoral neck fracture. No complicating feature. Electronically Signed   By: Charlett Nose M.D.   On: 12/29/2016 17:18       Today   Subjective:   [name]  Pt doing better, no complaints  Objective:   Blood pressure 117/60, pulse (!) 110, temperature 97.9 F (36.6 C), temperature source Oral, resp. rate 20, height 5\' 3"  (1.6 m), weight 92 lb 8 oz (42 kg), SpO2 95 %.  .  Intake/Output Summary (Last 24 hours) at 12/31/16 1022 Last data filed at 12/31/16 0945  Gross per 24 hour  Intake                0 ml  Output                0 ml  Net                0 ml    Exam VITAL SIGNS: Blood pressure 117/60, pulse (!) 110, temperature 97.9 F (36.6 C), temperature source Oral, resp. rate 20, height 5\' 3"  (1.6 m), weight 92 lb 8 oz (42 kg), SpO2 95 %.  GENERAL:  76 y.o.-year-old patient lying in the bed with no acute distress.  EYES: Pupils equal, round,  reactive to light and accommodation. No scleral icterus. Extraocular muscles intact.  HEENT: Head atraumatic, normocephalic. Oropharynx and nasopharynx clear.  NECK:  Supple, no jugular venous distention. No thyroid enlargement, no tenderness.  LUNGS: Normal breath sounds bilaterally, no wheezing, rales,rhonchi or crepitation. No use of accessory muscles of respiration.  CARDIOVASCULAR: S1, S2 normal. No murmurs, rubs, or gallops.  ABDOMEN: Soft, nontender, nondistended. Bowel sounds present. No organomegaly or mass.  EXTREMITIES: No pedal edema, cyanosis, or clubbing.  NEUROLOGIC: Cranial nerves II through XII are intact. Muscle strength 5/5 in all extremities. Sensation intact. Gait not checked.  PSYCHIATRIC: The patient is alert and oriented x 3.  SKIN: No obvious rash, lesion, or ulcer.   Data Review     CBC w Diff: Lab Results  Component Value Date   WBC 8.9 12/31/2016   HGB 10.3 (L) 12/31/2016   HGB 9.6 (L) 11/10/2014   HCT 31.5 (L) 12/31/2016   HCT 31.4 (L) 11/10/2014   PLT 333 12/31/2016   PLT 425 11/10/2014   LYMPHOPCT 14 05/15/2016   LYMPHOPCT 20.1 11/10/2014   MONOPCT 7 05/15/2016   MONOPCT 10.5 11/10/2014   EOSPCT 1 05/15/2016   EOSPCT 1.4 11/10/2014   BASOPCT 1 05/15/2016  BASOPCT 1.1 11/10/2014   CMP: Lab Results  Component Value Date   NA 133 (L) 12/31/2016   NA 139 11/10/2014   K 4.0 12/31/2016   K 3.6 11/10/2014   CL 97 (L) 12/31/2016   CL 107 11/10/2014   CO2 30 12/31/2016   CO2 24 11/10/2014   BUN 13 12/31/2016   BUN 7 11/10/2014   CREATININE 0.44 12/31/2016   CREATININE 0.56 11/10/2014   PROT 5.4 (L) 12/27/2016   PROT 6.9 11/09/2014   ALBUMIN 2.4 (L) 12/27/2016   ALBUMIN 3.5 11/09/2014   BILITOT 0.6 12/27/2016   BILITOT 0.3 11/09/2014   ALKPHOS 113 12/27/2016   ALKPHOS 155 (H) 11/09/2014   AST 21 12/27/2016   AST 19 11/09/2014   ALT 21 12/27/2016   ALT 15 11/09/2014  .  Micro Results Recent Results (from the past 240 hour(s))  Urine  culture     Status: None   Collection Time: 12/24/16  4:15 PM  Result Value Ref Range Status   Specimen Description URINE, RANDOM  Final   Special Requests NONE  Final   Culture   Final    NO GROWTH Performed at [facility], [address] [place], [place] [address]    Report Status 12/25/2016 FINAL  Final  Surgical pcr screen     Status: None   Collection Time: 12/29/16 12:26 PM  Result Value Ref Range Status   MRSA, PCR NEGATIVE NEGATIVE Final   Staphylococcus aureus NEGATIVE NEGATIVE Final    Comment:        The Xpert SA Assay (FDA approved for NASAL specimens in patients over 78 years of age), is one component of a comprehensive surveillance program.  Test performance has been validated by [facility] for patients greater than or equal to 78 year old. It is not intended to diagnose infection nor to guide or monitor treatment.         Code Status Orders        Start     Ordered   12/30/16 1256  Do not attempt resuscitation (DNR)  Continuous    Question Answer Comment  In the event of cardiac or respiratory ARREST Do not call a "code blue"   In the event of cardiac or respiratory ARREST Do not perform Intubation, CPR, defibrillation or ACLS   In the event of cardiac or respiratory ARREST Use medication by any route, position, wound care, and other measures to relive pain and suffering. May use oxygen, suction and manual treatment of airway obstruction as needed for comfort.      12/30/16 1255    Code Status History    Date Active Date Inactive Code Status Order ID Comments User Context   12/27/2016  6:31 PM 12/30/2016 12:55 PM Full Code [id number]  Adrian SaranMody, Sital, MD Inpatient   12/27/2016  4:36 PM 12/27/2016  6:31 PM DNR [id number]  Adrian SaranMody, Sital, MD ED           Contact information for follow-up providers    Kennedy BuckerMenz, Michael, MD Follow up in 2 week(s).   Specialty:  Orthopedic Surgery Contact information: [address] [facility] [place] [address] [phone]            Contact information for after-discharge care    Destination    [facility] .   Specialty:  Skilled Nursing Chief of Staffacility Contact information: [address] [place] [id number] [phone]  Discharge Medications   Allergies as of 12/31/2016      Reactions   Penicillins Swelling, Rash   Aspirin Other (See Comments)   GI bleed Stomach ulcers, bleeding    Ferrous Gluconate Nausea Only   Cannot take in IV form   Iron Polysaccharide Rash   Nsaids Rash   Severe GI upset and prior ulcer   Other Rash   Venofen- unknown   Sulfamethoxazole-trimethoprim Rash   Venofer [iron Sucrose] Rash      Medication List    STOP taking these medications   furosemide 20 MG tablet Commonly known as:  LASIX   metoprolol tartrate 25 MG tablet Commonly known as:  LOPRESSOR     TAKE these medications   acetaminophen 325 MG tablet Commonly known as:  TYLENOL Take 2 tablets (650 mg total) by mouth every 6 (six) hours as needed for mild pain (or Fever >/= 101).   amitriptyline 50 MG tablet Commonly known as:  ELAVIL Take 50 mg by mouth at bedtime.   ARIPiprazole 2 MG tablet Commonly known as:  ABILIFY Take 1 tablet (2 mg total) by mouth daily. Start taking on:  01/01/2017   calcium-vitamin D 500-200 MG-UNIT tablet Commonly known as:  OSCAL WITH D Take 1 tablet by mouth daily with breakfast.   docusate sodium 100 MG capsule Commonly known as:  COLACE Take 1 capsule (100 mg total) by mouth 2 (two) times daily.   enoxaparin 40 MG/0.4ML injection Commonly known as:  LOVENOX Inject 0.4 mLs (40 mg total) into the skin daily.   FLUoxetine 10 MG capsule Commonly known as:  PROZAC Take 3 capsules (30 mg total) by mouth daily. Start taking on:  01/01/2017 What changed:  how much to take   latanoprost 0.005 % ophthalmic solution Commonly known as:  XALATAN Place 1 drop into both eyes at  bedtime.   mirtazapine 15 MG disintegrating tablet Commonly known as:  REMERON SOL-TAB Take 0.5 tablets (7.5 mg total) by mouth at bedtime.   multivitamin tablet Take 1 tablet by mouth daily.   ondansetron 4 MG disintegrating tablet Commonly known as:  ZOFRAN-ODT 4 mg every 8 (eight) hours as needed.   oxyCODONE 5 MG immediate release tablet Commonly known as:  Oxy IR/ROXICODONE Take 1 tablet (5 mg total) by mouth every 4 (four) hours as needed for moderate pain or severe pain.   pantoprazole 40 MG tablet Commonly known as:  PROTONIX Take 40 mg by mouth daily.   SUMAtriptan 100 MG tablet Commonly known as:  IMITREX 100 mg every 2 (two) hours as needed.   tamsulosin 0.4 MG Caps capsule Commonly known as:  FLOMAX Take 0.4 mg by mouth daily.      LOVENOX for 2 wks   Total Time in preparing paper work, data evaluation and todays exam - 35 minutes  Auburn Bilberry M.D on 12/31/2016 at 10:22 AM  [facility]   Office  [phone]

## 2017-01-02 DIAGNOSIS — S72143A Displaced intertrochanteric fracture of unspecified femur, initial encounter for closed fracture: Secondary | ICD-10-CM | POA: Diagnosis not present

## 2017-01-02 DIAGNOSIS — K219 Gastro-esophageal reflux disease without esophagitis: Secondary | ICD-10-CM | POA: Diagnosis not present

## 2017-01-02 DIAGNOSIS — I5022 Chronic systolic (congestive) heart failure: Secondary | ICD-10-CM | POA: Diagnosis not present

## 2017-01-02 DIAGNOSIS — F323 Major depressive disorder, single episode, severe with psychotic features: Secondary | ICD-10-CM | POA: Diagnosis not present

## 2017-01-02 DIAGNOSIS — F0390 Unspecified dementia without behavioral disturbance: Secondary | ICD-10-CM | POA: Diagnosis not present

## 2017-01-02 DIAGNOSIS — E43 Unspecified severe protein-calorie malnutrition: Secondary | ICD-10-CM | POA: Diagnosis not present

## 2017-01-15 ENCOUNTER — Inpatient Hospital Stay: Payer: Medicare Other

## 2017-01-17 ENCOUNTER — Ambulatory Visit: Payer: Medicare Other

## 2017-01-17 ENCOUNTER — Ambulatory Visit: Payer: Medicare Other | Admitting: Internal Medicine

## 2017-01-20 ENCOUNTER — Ambulatory Visit: Payer: Medicare Other

## 2017-01-20 ENCOUNTER — Ambulatory Visit: Payer: Medicare Other | Admitting: Internal Medicine

## 2017-01-20 ENCOUNTER — Encounter: Payer: Self-pay | Admitting: Emergency Medicine

## 2017-01-20 ENCOUNTER — Emergency Department
Admission: EM | Admit: 2017-01-20 | Discharge: 2017-01-20 | Disposition: A | Discharge status: Home / Self Care | Payer: Medicare Other | Attending: Emergency Medicine | Admitting: Emergency Medicine

## 2017-01-20 ENCOUNTER — Emergency Department: Payer: Medicare Other

## 2017-01-20 DIAGNOSIS — S52125A Nondisplaced fracture of head of left radius, initial encounter for closed fracture: Secondary | ICD-10-CM | POA: Diagnosis not present

## 2017-01-20 DIAGNOSIS — S72142S Displaced intertrochanteric fracture of left femur, sequela: Secondary | ICD-10-CM | POA: Diagnosis not present

## 2017-01-20 DIAGNOSIS — Y92129 Unspecified place in nursing home as the place of occurrence of the external cause: Secondary | ICD-10-CM | POA: Diagnosis not present

## 2017-01-20 DIAGNOSIS — W010XXA Fall on same level from slipping, tripping and stumbling without subsequent striking against object, initial encounter: Secondary | ICD-10-CM | POA: Diagnosis not present

## 2017-01-20 DIAGNOSIS — Z87891 Personal history of nicotine dependence: Secondary | ICD-10-CM | POA: Diagnosis not present

## 2017-01-20 DIAGNOSIS — S59902A Unspecified injury of left elbow, initial encounter: Secondary | ICD-10-CM | POA: Diagnosis present

## 2017-01-20 DIAGNOSIS — Y999 Unspecified external cause status: Secondary | ICD-10-CM | POA: Insufficient documentation

## 2017-01-20 DIAGNOSIS — Y9301 Activity, walking, marching and hiking: Secondary | ICD-10-CM | POA: Insufficient documentation

## 2017-01-20 DIAGNOSIS — I5022 Chronic systolic (congestive) heart failure: Secondary | ICD-10-CM | POA: Diagnosis not present

## 2017-01-20 DIAGNOSIS — Z79899 Other long term (current) drug therapy: Secondary | ICD-10-CM | POA: Diagnosis not present

## 2017-01-20 MED ORDER — ACETAMINOPHEN 500 MG PO TABS
1000.0000 mg | ORAL_TABLET | Freq: Once | ORAL | Status: AC
  Administered 2017-01-20: 1000 mg via ORAL
  Filled 2017-01-20: qty 2

## 2017-01-20 NOTE — Discharge Instructions (Addendum)
Fortunately today her EKG and the x-ray of your hip were very normal. The x-ray of your elbow showed small bit of swelling and a possible small fracture. Please use your sling as needed for comfort and follow up with Dr. Rosita Kea within a week or so for a recheck in 2 x-rays of the left elbow. Return to the emergency department for any concerns.  It was a pleasure to take care of you today, and thank you for coming to our emergency department.  If you have any questions or concerns before leaving please ask the nurse to grab me and I'm more than happy to go through your aftercare instructions again.  If you were prescribed any opioid pain medication today such as Norco, Vicodin, Percocet, morphine, hydrocodone, or oxycodone please make sure you do not drive when you are taking this medication as it can alter your ability to drive safely.  If you have any concerns once you are home that you are not improving or are in fact getting worse before you can make it to your follow-up appointment, please do not hesitate to call 911 and come back for further evaluation.  Merrily Brittle MD  Results for orders placed or performed during the hospital encounter of 12/24/16  Urine culture  Result Value Ref Range   Specimen Description URINE, RANDOM    Special Requests NONE    Culture      NO GROWTH Performed at [facility], [address], [place], [place] [address]    Report Status 12/25/2016 FINAL   Surgical pcr screen  Result Value Ref Range   MRSA, PCR NEGATIVE NEGATIVE   Staphylococcus aureus NEGATIVE NEGATIVE  Comprehensive metabolic panel  Result Value Ref Range   Sodium 135 135 - 145 mmol/L   Potassium 3.7 3.5 - 5.1 mmol/L   Chloride 106 101 - 111 mmol/L   CO2 22 22 - 32 mmol/L   Glucose, Bld 94 65 - 99 mg/dL   BUN 16 6 - 20 mg/dL   Creatinine, Ser 2.95 0.44 - 1.00 mg/dL   Calcium 8.4 (L) 8.9 - 10.3 mg/dL   Total Protein 5.5 (L) 6.5 - 8.1 g/dL   Albumin 2.4 (L) 3.5 - 5.0 g/dL   AST 25  15 - 41 U/L   ALT 16 14 - 54 U/L   Alkaline Phosphatase 134 (H) 38 - 126 U/L   Total Bilirubin 0.3 0.3 - 1.2 mg/dL   GFR calc non Af Amer >60 >60 mL/min   GFR calc Af Amer >60 >60 mL/min   Anion gap 7 5 - 15  CBC  Result Value Ref Range   WBC 8.2 3.6 - 11.0 K/uL   RBC 4.28 3.80 - 5.20 MIL/uL   Hemoglobin 12.1 12.0 - 16.0 g/dL   HCT 62.1 30.8 - 65.7 %   MCV 85.9 80.0 - 100.0 fL   MCH 28.2 26.0 - 34.0 pg   MCHC 32.8 32.0 - 36.0 g/dL   RDW 84.6 (H) 96.2 - 95.2 %   Platelets 326 150 - 440 K/uL  Urinalysis, Complete w Microscopic  Result Value Ref Range   Color, Urine YELLOW (A) YELLOW   APPearance HAZY (A) CLEAR   Specific Gravity, Urine 1.011 1.005 - 1.030   pH 5.0 5.0 - 8.0   Glucose, UA NEGATIVE NEGATIVE mg/dL   Hgb urine dipstick NEGATIVE NEGATIVE   Bilirubin Urine NEGATIVE NEGATIVE   Ketones, ur NEGATIVE NEGATIVE mg/dL   Protein, ur NEGATIVE NEGATIVE mg/dL  Nitrite NEGATIVE NEGATIVE   Leukocytes, UA NEGATIVE NEGATIVE   RBC / HPF 0-5 0 - 5 RBC/hpf   WBC, UA 0-5 0 - 5 WBC/hpf   Bacteria, UA NONE SEEN NONE SEEN   Squamous Epithelial / LPF 0-5 (A) NONE SEEN   Mucous PRESENT    Ca Oxalate Crys, UA PRESENT   Comprehensive metabolic panel  Result Value Ref Range   Sodium 138 135 - 145 mmol/L   Potassium 2.5 (LL) 3.5 - 5.1 mmol/L   Chloride 101 101 - 111 mmol/L   CO2 29 22 - 32 mmol/L   Glucose, Bld 94 65 - 99 mg/dL   BUN 11 6 - 20 mg/dL   Creatinine, Ser 1.61 0.44 - 1.00 mg/dL   Calcium 8.0 (L) 8.9 - 10.3 mg/dL   Total Protein 5.4 (L) 6.5 - 8.1 g/dL   Albumin 2.4 (L) 3.5 - 5.0 g/dL   AST 21 15 - 41 U/L   ALT 21 14 - 54 U/L   Alkaline Phosphatase 113 38 - 126 U/L   Total Bilirubin 0.6 0.3 - 1.2 mg/dL   GFR calc non Af Amer >60 >60 mL/min   GFR calc Af Amer >60 >60 mL/min   Anion gap 8 5 - 15  Magnesium  Result Value Ref Range   Magnesium 1.6 (L) 1.7 - 2.4 mg/dL  Phosphorus  Result Value Ref Range   Phosphorus 2.6 2.5 - 4.6 mg/dL  CBC  Result Value Ref Range    WBC 6.6 3.6 - 11.0 K/uL   RBC 3.85 3.80 - 5.20 MIL/uL   Hemoglobin 10.8 (L) 12.0 - 16.0 g/dL   HCT 09.6 (L) 04.5 - 40.9 %   MCV 86.7 80.0 - 100.0 fL   MCH 28.1 26.0 - 34.0 pg   MCHC 32.5 32.0 - 36.0 g/dL   RDW 81.1 (H) 91.4 - 78.2 %   Platelets 259 150 - 440 K/uL  Creatinine, serum  Result Value Ref Range   Creatinine, Ser 0.60 0.44 - 1.00 mg/dL   GFR calc non Af Amer >60 >60 mL/min   GFR calc Af Amer >60 >60 mL/min  Basic metabolic panel  Result Value Ref Range   Sodium 137 135 - 145 mmol/L   Potassium 3.2 (L) 3.5 - 5.1 mmol/L   Chloride 104 101 - 111 mmol/L   CO2 26 22 - 32 mmol/L   Glucose, Bld 82 65 - 99 mg/dL   BUN 11 6 - 20 mg/dL   Creatinine, Ser 9.56 0.44 - 1.00 mg/dL   Calcium 7.7 (L) 8.9 - 10.3 mg/dL   GFR calc non Af Amer >60 >60 mL/min   GFR calc Af Amer >60 >60 mL/min   Anion gap 7 5 - 15  CBC  Result Value Ref Range   WBC 6.3 3.6 - 11.0 K/uL   RBC 3.86 3.80 - 5.20 MIL/uL   Hemoglobin 11.2 (L) 12.0 - 16.0 g/dL   HCT 21.3 (L) 08.6 - 57.8 %   MCV 87.2 80.0 - 100.0 fL   MCH 29.0 26.0 - 34.0 pg   MCHC 33.2 32.0 - 36.0 g/dL   RDW 46.9 (H) 62.9 - 52.8 %   Platelets 226 150 - 440 K/uL  Magnesium  Result Value Ref Range   Magnesium 2.2 1.7 - 2.4 mg/dL  Urinalysis, Complete w Microscopic  Result Value Ref Range   Color, Urine YELLOW (A) YELLOW   APPearance CLEAR (A) CLEAR   Specific Gravity, Urine 1.012 1.005 - 1.030  pH 5.0 5.0 - 8.0   Glucose, UA NEGATIVE NEGATIVE mg/dL   Hgb urine dipstick NEGATIVE NEGATIVE   Bilirubin Urine NEGATIVE NEGATIVE   Ketones, ur NEGATIVE NEGATIVE mg/dL   Protein, ur NEGATIVE NEGATIVE mg/dL   Nitrite NEGATIVE NEGATIVE   Leukocytes, UA NEGATIVE NEGATIVE   RBC / HPF 0-5 0 - 5 RBC/hpf   WBC, UA 0-5 0 - 5 WBC/hpf   Bacteria, UA NONE SEEN NONE SEEN   Squamous Epithelial / LPF 0-5 (A) NONE SEEN   Mucous PRESENT    Hyaline Casts, UA PRESENT   Basic metabolic panel  Result Value Ref Range   Sodium 138 135 - 145 mmol/L    Potassium 5.7 (H) 3.5 - 5.1 mmol/L   Chloride 109 101 - 111 mmol/L   CO2 28 22 - 32 mmol/L   Glucose, Bld 83 65 - 99 mg/dL   BUN 12 6 - 20 mg/dL   Creatinine, Ser 1.610.34 (L) 0.44 - 1.00 mg/dL   Calcium 7.7 (L) 8.9 - 10.3 mg/dL   GFR calc non Af Amer >60 >60 mL/min   GFR calc Af Amer >60 >60 mL/min   Anion gap 1 (L) 5 - 15  Magnesium  Result Value Ref Range   Magnesium 2.0 1.7 - 2.4 mg/dL  Calcium, ionized  Result Value Ref Range   Calcium, Ionized, Serum 5.1 4.5 - 5.6 mg/dL  Potassium  Result Value Ref Range   Potassium 5.0 3.5 - 5.1 mmol/L  Basic metabolic panel  Result Value Ref Range   Sodium 133 (L) 135 - 145 mmol/L   Potassium 4.8 3.5 - 5.1 mmol/L   Chloride 96 (L) 101 - 111 mmol/L   CO2 29 22 - 32 mmol/L   Glucose, Bld 128 (H) 65 - 99 mg/dL   BUN 10 6 - 20 mg/dL   Creatinine, Ser 0.960.32 (L) 0.44 - 1.00 mg/dL   Calcium 8.4 (L) 8.9 - 10.3 mg/dL   GFR calc non Af Amer >60 >60 mL/min   GFR calc Af Amer >60 >60 mL/min   Anion gap 8 5 - 15  CBC  Result Value Ref Range   WBC 10.4 3.6 - 11.0 K/uL   RBC 4.34 3.80 - 5.20 MIL/uL   Hemoglobin 12.2 12.0 - 16.0 g/dL   HCT 04.537.6 40.935.0 - 81.147.0 %   MCV 86.7 80.0 - 100.0 fL   MCH 28.1 26.0 - 34.0 pg   MCHC 32.4 32.0 - 36.0 g/dL   RDW 91.415.1 (H) 78.211.5 - 95.614.5 %   Platelets 306 150 - 440 K/uL  CBC  Result Value Ref Range   WBC 8.9 3.6 - 11.0 K/uL   RBC 3.68 (L) 3.80 - 5.20 MIL/uL   Hemoglobin 10.3 (L) 12.0 - 16.0 g/dL   HCT 21.331.5 (L) 08.635.0 - 57.847.0 %   MCV 85.6 80.0 - 100.0 fL   MCH 27.9 26.0 - 34.0 pg   MCHC 32.7 32.0 - 36.0 g/dL   RDW 46.914.8 (H) 62.911.5 - 52.814.5 %   Platelets 333 150 - 440 K/uL  Basic metabolic panel  Result Value Ref Range   Sodium 133 (L) 135 - 145 mmol/L   Potassium 4.0 3.5 - 5.1 mmol/L   Chloride 97 (L) 101 - 111 mmol/L   CO2 30 22 - 32 mmol/L   Glucose, Bld 84 65 - 99 mg/dL   BUN 13 6 - 20 mg/dL   Creatinine, Ser 4.130.44 0.44 - 1.00 mg/dL   Calcium 8.1 (L) 8.9 -  10.3 mg/dL   GFR calc non Af Amer >60 >60 mL/min   GFR  calc Af Amer >60 >60 mL/min   Anion gap 6 5 - 15  Magnesium  Result Value Ref Range   Magnesium 1.8 1.7 - 2.4 mg/dL  Phosphorus  Result Value Ref Range   Phosphorus 3.9 2.5 - 4.6 mg/dL   Dg Pelvis 1-2 Views  Result Date: 12/26/2016 CLINICAL DATA:  Bilateral hip pain EXAM: PELVIS - 1-2 VIEW COMPARISON:  01/12/2016 FINDINGS: Bones are diffusely demineralized. Patient is status post sacral plasty. Age-indeterminate fractures right superior inferior pubic rami evident. Three cannulated compression screws transfix a right femoral neck fracture, similar to prior. IMPRESSION: Osteopenia with age indeterminate right superior and inferior pubic rami fractures. Electronically Signed   By: Kennith Center M.D.   On: 12/26/2016 17:17   Dg Elbow Complete Left  Result Date: 01/20/2017 CLINICAL DATA:  Fall. EXAM: LEFT ELBOW - COMPLETE 3+ VIEW COMPARISON:  06/18/2014 . FINDINGS: Left elbow joint effusion appears to be present. Subtle lucency noted in the radial head. This could be a vascular channel. Very subtle radial head fracture cannot be completely excluded. Diffuse osteopenia. Mild degenerative change. IMPRESSION: 1. Left elbow joint effusion appears to be present. 2. Subtle lucency noted in the radial head. This could represent a vascular channel. A subtle nondisplaced fracture cannot be completely excluded. Electronically Signed   By: Maisie Fus  Register   On: 01/20/2017 07:35   Ct Pelvis Wo Contrast  Result Date: 12/28/2016 CLINICAL DATA:  Recent fall with left hip pain, initial encounter EXAM: CT PELVIS WITHOUT CONTRAST TECHNIQUE: Multidetector CT imaging of the pelvis was performed following the standard protocol without intravenous contrast. COMPARISON:  12/26/2016 FINDINGS: Urinary Tract: Tiny nonobstructing right renal stone is noted. The bladder is well distended. Bowel:  Visualized bowel appears within normal limits. Vascular/Lymphatic: Diffuse vascular calcifications are noted. No significant  lymphadenopathy is noted. Reproductive:  No mass or other significant abnormality Other:  None. Musculoskeletal: Mild degenerative changes of lumbar spine are seen. Changes of prior vertebral augmentation are noted at L5. Healing pubic rami fractures are noted on the right both in the superior and inferior pubic ramus. Prior pinning of the proximal right femur is seen. There is an incomplete fracture of the proximal left femur involving the femoral neck at its junction with the intratrochanteric region. A small amount of joint fluid is noted. Mild subcutaneous edema from the injury is noted. No other fractures are seen. IMPRESSION: Incomplete fracture of the left femoral neck at its junction with the intratrochanteric region. This is best visualized on coronal reconstructions images of series 6. Healing right pubic rami fractures as well as prior pinning of the proximal right femur. Tiny nonobstructing right renal stone. Electronically Signed   By: Alcide Clever M.D.   On: 12/28/2016 14:53   Dg Chest Portable 1 View  Result Date: 12/24/2016 CLINICAL DATA:  76 y/o  F; agitation. EXAM: PORTABLE CHEST 1 VIEW COMPARISON:  11/09/2014 chest radiograph FINDINGS: Stable borderline cardiomegaly. Aortic atherosclerosis with calcification. Surgical clips project over gastroesophageal junction. Stable hyperinflated lungs. Stable chronic bronchitic changes in the lung bases. No new focal consolidation, effusion, or pneumothorax. Bones are unremarkable. IMPRESSION: No active disease. Electronically Signed   By: Mitzi Hansen M.D.   On: 12/24/2016 16:33   Dg Hip Operative Unilat W Or W/o Pelvis Left  Result Date: 12/29/2016 CLINICAL DATA:  Left hip nailing EXAM: OPERATIVE LEFT HIP (WITH PELVIS IF PERFORMED) 3 VIEWS TECHNIQUE: Fluoroscopic  spot image(s) were submitted for interpretation post-operatively. COMPARISON:  None. FINDINGS: Changes of internal fixation across the left see femoral neck fracture. Anatomic  alignment. No hardware complicating feature. IMPRESSION: Internal fixation across the left femoral neck fracture. No complicating feature. Electronically Signed   By: Charlett Nose M.D.   On: 12/29/2016 17:18   Dg Hip Unilat With Pelvis 2-3 Views Left  Result Date: 01/20/2017 CLINICAL DATA:  Recent fall with left hip pain, initial encounter EXAM: DG HIP (WITH OR WITHOUT PELVIS) 3V LEFT COMPARISON:  12/28/2016 FINDINGS: Postsurgical changes are noted in the proximal femurs bilaterally. Prior vertebral augmentation at L5 as noted. Bilateral soft tissue calcifications are noted over the iliac wings. No acute fracture or dislocation is noted. No acute soft tissue abnormality is seen. IMPRESSION: Postsurgical changes without acute abnormality. Electronically Signed   By: Alcide Clever M.D.   On: 01/20/2017 07:36

## 2017-01-20 NOTE — ED Notes (Signed)
Pt given [place] sandwich tray and a coke.

## 2017-01-20 NOTE — ED Triage Notes (Signed)
Patient presents to Emergency Department via [facility] with complaints of unwitnessed fall.  Pt from [facility] and is there recovering from a previous fall that happened about 3 weeks ago.    PT c/o pain 7/10 at left elbow/forearm, pt o& a x4.  No obvious deformity.

## 2017-01-20 NOTE — ED Notes (Signed)
Pt assisted to use bathroom with no difficulties.

## 2017-01-20 NOTE — ED Provider Notes (Signed)
[facility] Emergency Department Provider Note  ____________________________________________   First MD Initiated Contact with Patient 01/20/17 [phone]     (approximate)  I have reviewed the triage vital signs and the nursing notes.   HISTORY  Chief Complaint Fall   HPI Ashley Young is a 76 y.o. female who comes to the emergency department via EMS after mechanical fall at her nursing home this morning. She is currently at the home recovering from a left sided open reduction internal fixation of intertrochanteric hip fracture. This morning she got up to walk to the bathroom when she reports a mechanical trip and fall landing onto her left elbow and onto her left hip. She was able to get up thereafter and ambulate. Her pain is primarily in her left elbow. It is mild to moderate aching worse when ranging her elbow improved when not ranging her elbow. She did not hit her head. She denies antecedent chest pain or palpitations. She denies shortness of breath abdominal pain nausea or vomiting.   Past Medical History:  Diagnosis Date  . Anemia   . Arthritis   . B12 deficiency   . Blood transfusion without reported diagnosis   . CHF (congestive heart failure) (HCC)   . Depression   . Dysrhythmia    TACHYCARDIA  . Edema    MILD OF ANKLES  . Failure to thrive (0-17)   . GERD (gastroesophageal reflux disease)   . Headache    MIGRAINES  . Hypercholesterolemia   . IDA (iron deficiency anemia)   . Microcytic anemia   . Migraine   . Multiple gastric ulcers   . Renal disorder    Multiple episodes of kidney stones.     Patient Active Problem List   Diagnosis Date Noted  . DNR (do not resuscitate) 12/30/2016  . Hip fracture, left (HCC)   . Palliative care encounter   . Hypokalemia 12/27/2016  . Severe recurrent major depression without psychotic features (HCC) 12/24/2016  . Failure to thrive (0-17) 12/24/2016  . Chronic systolic heart failure (HCC)  95/28/4132  . Borderline diabetes mellitus 01/02/2016  . Pure hypercholesterolemia 01/02/2016  . Goals of care, counseling/discussion 01/02/2016  . B12 deficiency 04/26/2015  . Iron deficiency anemia due to chronic blood loss 04/26/2015  . History of anemia 03/28/2015  . 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 DDD (degenerative disc disease), lumbar 03/09/2014  . Degeneration of intervertebral disc of lumbar region 03/09/2014  . Avitaminosis D 02/09/2014  . Anxiety state 01/04/2014  . Clinical depression 01/04/2014  . Edema leg 01/04/2014  . Recurrent major depression in remission (HCC) 01/04/2014  . Abdominal pain 11/19/2013  . Acquired gastric outlet stenosis 08/25/2013  . Adverse effect of salicylate 08/26/2013  . Anxiety and depression 08/26/2013  . Esophagitis 08/14/2013  . H/O gastrointestinal hemorrhage 08/06/2013  . H/O gastric ulcer 07/29/2013  . Disorder of nutrition 08/03/2013  . Headache, migraine 07/31/2013  . Calculus of kidney 08/14/2013  . OP (osteoporosis) 08/18/2013    Past Surgical History:  Procedure Laterality Date  . ABDOMINAL SURGERY     Reconstructed stomach and 3 different surgeries.   . APPENDECTOMY    . BREAST SURGERY     cyst removal  . CATARACT EXTRACTION W/PHACO Right 06/05/2015   Procedure: CATARACT EXTRACTION PHACO AND INTRAOCULAR LENS PLACEMENT (IOC);  Surgeon: Lockie Mola, MD;  Location: [facility] ORS;  Service: Ophthalmology;  Laterality: Right;  [place] 01:06   . CESAREAN SECTION     x 3  .  CHOLECYSTECTOMY    . COLONOSCOPY    . ESOPHAGOGASTRODUODENOSCOPY    . FRACTURE SURGERY     HIP  . INTRAMEDULLARY (IM) NAIL INTERTROCHANTERIC Left 12/29/2016   Procedure: INTRAMEDULLARY (IM) NAIL INTERTROCHANTRIC;  Surgeon: Kennedy Bucker, MD;  Location: [facility] ORS;  Service: Orthopedics;  Laterality: Left;  . KYPHOPLASTY      Prior to Admission medications   Medication Sig Start Date End Date Taking? Authorizing Provider  acetaminophen (TYLENOL)  325 MG tablet Take 2 tablets (650 mg total) by mouth every 6 (six) hours as needed for mild pain (or Fever >/= 101). 12/31/16   Auburn Bilberry, MD  amitriptyline (ELAVIL) 50 MG tablet Take 50 mg by mouth at bedtime.  11/07/14   [provider]  ARIPiprazole (ABILIFY) 2 MG tablet Take 1 tablet (2 mg total) by mouth daily. 01/01/17   Auburn Bilberry, MD  calcium-vitamin D (OSCAL WITH D) 500-200 MG-UNIT tablet Take 1 tablet by mouth daily with breakfast.    [provider]  docusate sodium (COLACE) 100 MG capsule Take 1 capsule (100 mg total) by mouth 2 (two) times daily. 12/31/16   Auburn Bilberry, MD  enoxaparin (LOVENOX) 40 MG/0.4ML injection Inject 0.4 mLs (40 mg total) into the skin daily. 12/31/16 01/14/17  Evon Slack, PA-C  FLUoxetine (PROZAC) 10 MG capsule Take 3 capsules (30 mg total) by mouth daily. 01/01/17   Auburn Bilberry, MD  latanoprost (XALATAN) 0.005 % ophthalmic solution Place 1 drop into both eyes at bedtime.  11/18/14   [provider]  mirtazapine (REMERON SOL-TAB) 15 MG disintegrating tablet Take 0.5 tablets (7.5 mg total) by mouth at bedtime. 12/31/16   Auburn Bilberry, MD  Multiple Vitamin (MULTIVITAMIN) tablet Take 1 tablet by mouth daily.    [provider]  ondansetron (ZOFRAN-ODT) 4 MG disintegrating tablet 4 mg every 8 (eight) hours as needed.  10/06/14   [provider]  oxyCODONE (OXY IR/ROXICODONE) 5 MG immediate release tablet Take 1 tablet (5 mg total) by mouth every 4 (four) hours as needed for moderate pain or severe pain. 12/31/16   Auburn Bilberry, MD  pantoprazole (PROTONIX) 40 MG tablet Take 40 mg by mouth daily.  09/08/14   [provider]  SUMAtriptan (IMITREX) 100 MG tablet 100 mg every 2 (two) hours as needed.  12/30/14   [provider]  tamsulosin (FLOMAX) 0.4 MG CAPS capsule Take 0.4 mg by mouth daily.    [provider]    Allergies Penicillins; Aspirin; Ferrous gluconate; Iron polysaccharide;  Nsaids; Other; Sulfamethoxazole-trimethoprim; and Venofer [iron sucrose]  History reviewed. No pertinent family history.  Social History Social History  Substance Use Topics  . Smoking status: Former Smoker    Types: Cigarettes    Quit date: 11/29/1993  . Smokeless tobacco: Never Used  . Alcohol use 0.6 oz/week    1 Standard drinks or equivalent per week    Review of Systems Constitutional: No fever/chills Eyes: No visual changes. ENT: No sore throat. Cardiovascular: Denies chest pain. Respiratory: Denies shortness of breath. Gastrointestinal: No abdominal pain.  No nausea, no vomiting.  No diarrhea.  No constipation. Genitourinary: Negative for dysuria. Musculoskeletal: Negative for back pain. Skin: Negative for rash. Neurological: Negative for headaches, focal weakness or numbness.   ____________________________________________   PHYSICAL EXAM:  VITAL SIGNS: ED Triage Vitals  Enc Vitals Group     BP 01/20/17 0617 122/65     Pulse Rate 01/20/17 0617 95     Resp 01/20/17 0617 18  Temp 01/20/17 0617 97.6 F (36.4 C)     Temp Source 01/20/17 0617 Oral     SpO2 01/20/17 0617 100 %     Weight 01/20/17 0619 84 lb 12.8 oz (38.5 kg)     Height 01/20/17 0619 5\' 2"  (1.575 m)     Head Circumference --      Peak Flow --      Pain Score 01/20/17 0616 7     Pain Loc --      Pain Edu? --      Excl. in GC? --     Constitutional: Alert and oriented 4 joking laughing very well-appearing cooperative speaks in full clear sentences Eyes: PERRL EOMI. Head: Atraumatic. Nose: No congestion/rhinnorhea. Mouth/Throat: No trismus Neck: No stridor.   Cardiovascular: Normal rate, regular rhythm. Grossly normal heart sounds.  Good peripheral circulation. Respiratory: Normal respiratory effort.  No retractions. Lungs CTAB and moving good air Gastrointestinal: Soft nontender Musculoskeletal: No bony tenderness over left elbow. Able to fully extend her arm and only supinate and pronate  without difficulty. Skin closed compartments are soft. Range of motion left hip with no bony tenderness Neurologic:  Normal speech and language. No gross focal neurologic deficits are appreciated. Skin:  Skin is warm, dry and intact. No rash noted. Psychiatric: Mood and affect are normal. Speech and behavior are normal.    ____________________________________________   DIFFERENTIAL includes but not limited to  Elbow dislocation, radial fracture, ulnar fracture, hip fracture, mechanical fall, syncope ____________________________________________   LABS (all labs ordered are listed, but only abnormal results are displayed)  Labs Reviewed - No data to display   __________________________________________  EKG  ED ECG REPORT I, Merrily BrittleNeil Darrie Macmillan, the attending physician, personally viewed and interpreted this ECG.  Date: 01/20/2017 Rate: 92 Rhythm: normal sinus rhythm QRS Axis: normal Intervals: normal ST/T Wave abnormalities: normal Narrative Interpretation: unremarkable  ____________________________________________  RADIOLOGY  X-ray of the hip with normal postsurgical changes and no acute disease x-ray of the left elbow with small effusion and possible nondisplaced radial head fracture ____________________________________________   PROCEDURES  Procedure(s) performed: no  Procedures  Critical Care performed: no  Observation: no ____________________________________________   INITIAL IMPRESSION / ASSESSMENT AND PLAN / ED COURSE  Pertinent labs & imaging results that were available during my care of the patient were reviewed by me and considered in my medical decision making (see chart for details).  The patient arrives very well-appearing after clear mechanical fall. She was able to ambulate after the event. She does have some discomfort in her left elbow and hip, however without particular bony tenderness and full range of motion. X-rays are pending.      Fortunately the patient's x-ray of her hip is negative for acute pathology. X-ray of her elbow suggests an effusion as well as a possible nondisplaced radial head fracture. Clinically she is able to fully extend her arm and supinate her elbow although she does have minimal discomfort.  I have placed her in a sling and will refer her back to her orthopedic surgeon Dr. Rosita KeaMenz in one week for a recheck.  She is discharged home in improved condition. ____________________________________________   FINAL CLINICAL IMPRESSION(S) / ED DIAGNOSES  Final diagnoses:  Closed nondisplaced fracture of head of left radius, initial encounter      NEW MEDICATIONS STARTED DURING THIS VISIT:  New Prescriptions   No medications on file     Note:  This document was prepared using Dragon voice recognition software and may include  unintentional dictation errors.     Merrily Brittle, MD 01/20/17 [phone]

## 2017-01-22 ENCOUNTER — Encounter: Payer: Self-pay | Admitting: Internal Medicine

## 2017-01-22 DIAGNOSIS — F39 Unspecified mood [affective] disorder: Secondary | ICD-10-CM | POA: Insufficient documentation

## 2017-01-23 ENCOUNTER — Telehealth: Payer: Self-pay

## 2017-01-23 NOTE — Telephone Encounter (Signed)
Med rec done

## 2017-01-28 ENCOUNTER — Ambulatory Visit: Payer: Medicare Other | Admitting: Internal Medicine

## 2017-04-21 ENCOUNTER — Telehealth: Payer: Self-pay | Admitting: Internal Medicine

## 2017-04-23 ENCOUNTER — Inpatient Hospital Stay (HOSPITAL_BASED_OUTPATIENT_CLINIC_OR_DEPARTMENT_OTHER): Payer: Medicare Other | Admitting: Internal Medicine

## 2017-04-23 ENCOUNTER — Inpatient Hospital Stay: Payer: Medicare Other

## 2017-04-23 ENCOUNTER — Ambulatory Visit: Payer: Medicare Other

## 2017-04-23 ENCOUNTER — Ambulatory Visit
Admission: RE | Admit: 2017-04-23 | Discharge: 2017-04-23 | Disposition: A | Discharge status: Home / Self Care | Payer: Medicare Other | Source: Ambulatory Visit | Attending: Internal Medicine | Admitting: Internal Medicine

## 2017-04-23 ENCOUNTER — Inpatient Hospital Stay: Payer: Medicare Other | Attending: Internal Medicine

## 2017-04-23 VITALS — BP 106/70 | HR 85 | Temp 97.6°F | Resp 20 | Ht 62.0 in | Wt 90.0 lb

## 2017-04-23 VITALS — BP 113/72 | HR 84

## 2017-04-23 DIAGNOSIS — Z8781 Personal history of (healed) traumatic fracture: Secondary | ICD-10-CM | POA: Diagnosis not present

## 2017-04-23 DIAGNOSIS — D500 Iron deficiency anemia secondary to blood loss (chronic): Secondary | ICD-10-CM

## 2017-04-23 DIAGNOSIS — E7800 Pure hypercholesterolemia, unspecified: Secondary | ICD-10-CM | POA: Diagnosis not present

## 2017-04-23 DIAGNOSIS — R0602 Shortness of breath: Secondary | ICD-10-CM

## 2017-04-23 DIAGNOSIS — I5022 Chronic systolic (congestive) heart failure: Secondary | ICD-10-CM

## 2017-04-23 DIAGNOSIS — R000 Tachycardia, unspecified: Secondary | ICD-10-CM | POA: Diagnosis not present

## 2017-04-23 DIAGNOSIS — Z79899 Other long term (current) drug therapy: Secondary | ICD-10-CM | POA: Diagnosis not present

## 2017-04-23 DIAGNOSIS — Z87891 Personal history of nicotine dependence: Secondary | ICD-10-CM | POA: Insufficient documentation

## 2017-04-23 DIAGNOSIS — E538 Deficiency of other specified B group vitamins: Secondary | ICD-10-CM | POA: Diagnosis not present

## 2017-04-23 DIAGNOSIS — K219 Gastro-esophageal reflux disease without esophagitis: Secondary | ICD-10-CM

## 2017-04-23 DIAGNOSIS — F329 Major depressive disorder, single episode, unspecified: Secondary | ICD-10-CM | POA: Insufficient documentation

## 2017-04-23 DIAGNOSIS — R600 Localized edema: Secondary | ICD-10-CM

## 2017-04-23 DIAGNOSIS — Z803 Family history of malignant neoplasm of breast: Secondary | ICD-10-CM | POA: Insufficient documentation

## 2017-04-23 DIAGNOSIS — R635 Abnormal weight gain: Secondary | ICD-10-CM

## 2017-04-23 DIAGNOSIS — M7989 Other specified soft tissue disorders: Secondary | ICD-10-CM | POA: Insufficient documentation

## 2017-04-23 DIAGNOSIS — D508 Other iron deficiency anemias: Secondary | ICD-10-CM

## 2017-04-23 DIAGNOSIS — R627 Adult failure to thrive: Secondary | ICD-10-CM

## 2017-04-23 DIAGNOSIS — Z8711 Personal history of peptic ulcer disease: Secondary | ICD-10-CM

## 2017-04-23 DIAGNOSIS — Z87442 Personal history of urinary calculi: Secondary | ICD-10-CM

## 2017-04-23 DIAGNOSIS — M818 Other osteoporosis without current pathological fracture: Secondary | ICD-10-CM | POA: Insufficient documentation

## 2017-04-23 DIAGNOSIS — Z801 Family history of malignant neoplasm of trachea, bronchus and lung: Secondary | ICD-10-CM

## 2017-04-23 DIAGNOSIS — D509 Iron deficiency anemia, unspecified: Secondary | ICD-10-CM

## 2017-04-23 DIAGNOSIS — Z9181 History of falling: Secondary | ICD-10-CM

## 2017-04-23 DIAGNOSIS — R5383 Other fatigue: Secondary | ICD-10-CM | POA: Diagnosis not present

## 2017-04-23 LAB — CBC WITH DIFFERENTIAL/PLATELET
BASOS ABS: 0.1 10*3/uL (ref 0–0.1)
BASOS PCT: 1 %
EOS ABS: 0.1 10*3/uL (ref 0–0.7)
EOS PCT: 1 %
HCT: 36.8 % (ref 35.0–47.0)
HEMOGLOBIN: 12 g/dL (ref 12.0–16.0)
Lymphocytes Relative: 15 %
Lymphs Abs: 1.3 10*3/uL (ref 1.0–3.6)
MCH: 24.5 pg — AB (ref 26.0–34.0)
MCHC: 32.7 g/dL (ref 32.0–36.0)
MCV: 74.9 fL — ABNORMAL LOW (ref 80.0–100.0)
Monocytes Absolute: 0.6 10*3/uL (ref 0.2–0.9)
Monocytes Relative: 7 %
NEUTROS PCT: 76 %
Neutro Abs: 6.9 10*3/uL — ABNORMAL HIGH (ref 1.4–6.5)
Platelets: 421 10*3/uL (ref 150–440)
RBC: 4.91 MIL/uL (ref 3.80–5.20)
RDW: 16.5 % — ABNORMAL HIGH (ref 11.5–14.5)
WBC: 9.1 10*3/uL (ref 3.6–11.0)

## 2017-04-23 LAB — FERRITIN: FERRITIN: 13 ng/mL (ref 11–307)

## 2017-04-23 LAB — IRON AND TIBC
IRON: 21 ug/dL — AB (ref 28–170)
SATURATION RATIOS: 5 % — AB (ref 10.4–31.8)
TIBC: 468 ug/dL — ABNORMAL HIGH (ref 250–450)
UIBC: 447 ug/dL

## 2017-04-23 MED ORDER — SODIUM CHLORIDE 0.9 % IV SOLN
510.0000 mg | Freq: Once | INTRAVENOUS | Status: AC
  Administered 2017-04-23: 510 mg via INTRAVENOUS
  Filled 2017-04-23: qty 17

## 2017-04-23 MED ORDER — DIPHENHYDRAMINE HCL 50 MG/ML IJ SOLN
25.0000 mg | Freq: Once | INTRAMUSCULAR | Status: AC
  Administered 2017-04-23: 25 mg via INTRAVENOUS
  Filled 2017-04-23: qty 1

## 2017-04-23 MED ORDER — CYANOCOBALAMIN 1000 MCG/ML IJ SOLN
1000.0000 ug | Freq: Once | INTRAMUSCULAR | Status: AC
  Administered 2017-04-23: 1000 ug via INTRAMUSCULAR
  Filled 2017-04-23: qty 1

## 2017-04-23 NOTE — Assessment & Plan Note (Addendum)
#   Anemia- multifactorial/iron deficiency-B12 deficiency secondary to previous GI surgeries. Today hemoglobin is 12.9 [previously 9-10]. Proceed with IV Venofer today.  #  B12 deficiency- continue B12 shots every 2 months; Today.   # Bil LE swelling- question DVTs versus others. Recommend stat lower extremity venous Dopplers.  #  Recheck CBC/ ferritin every 4 months/ possible IV iron; 2 months B12.   Addendum; bilateral lower extremity venous Dopplers negative for DVT.

## 2017-04-23 NOTE — Progress Notes (Signed)
[facility] OFFICE PROGRESS NOTE  Patient Care Team: Marisue Ivan, MD as PCP - General (Family Medicine)   SUMMARY OF ONCOLOGIC HISTORY:  # ANEMIA- Multifactorial- Iron def/ B12 def [previous stomach surgeries/PUD]- IV iron last October 2016/  # B12  IM injections q 2 MONTHS [[facility]]  INTERVAL HISTORY:  A pleasant 76 year old female patient with above history of anemia likely secondary to malabsorption/question chronic GI blood loss- currently on IV iron/B12 shot is here for follow-up.    Unfortunately patient was recently emergency room; had a fall; had a broken hip. She ended up hospitalized; needing surgery.  Patient is currently in rehabilitation improving overall. However complains of fatigue. Also shortness of breath with exertion.  Patient notes to have bilateral lower extremity swelling; also weight gain.  Energy levels are good. Denies any blood in stool occult stools. She has poor tolerance of by mouth iron.   REVIEW OF SYSTEMS:  A complete 10 point review of system is done which is negative except mentioned above/history of present illness.   PAST MEDICAL HISTORY :  Past Medical History:  Diagnosis Date  . Arthritis   . B12 deficiency   . CHF (congestive heart failure) (HCC)   . Chronic systolic heart failure (HCC)   . Depression   . Dysrhythmia    TACHYCARDIA  . Failure to thrive (0-17)   . GERD (gastroesophageal reflux disease)   . Glaucoma   . Headache    MIGRAINES  . History of kidney stones   . Hypercholesterolemia   . IDA (iron deficiency anemia)   . Migraine   . Mood disorder (HCC)   . Multiple gastric ulcers   . Osteoporosis   . Renal disorder    Multiple episodes of kidney stones.     PAST SURGICAL HISTORY :   Past Surgical History:  Procedure Laterality Date  . ABDOMINAL SURGERY     Reconstructed stomach and 3 different surgeries.   . APPENDECTOMY    . BREAST SURGERY     cyst removal  . CATARACT EXTRACTION W/PHACO Right  06/05/2015   Procedure: CATARACT EXTRACTION PHACO AND INTRAOCULAR LENS PLACEMENT (IOC);  Surgeon: Lockie Mola, MD;  Location: [facility] ORS;  Service: Ophthalmology;  Laterality: Right;  [place] 01:06   . CESAREAN SECTION     x 3  . CHOLECYSTECTOMY    . COLONOSCOPY    . ESOPHAGOGASTRODUODENOSCOPY    . INTRAMEDULLARY (IM) NAIL INTERTROCHANTERIC Left 12/29/2016   Procedure: INTRAMEDULLARY (IM) NAIL INTERTROCHANTRIC;  Surgeon: Kennedy Bucker, MD;  Location: [facility] ORS;  Service: Orthopedics;  Laterality: Left;  . KYPHOPLASTY    . KYPHOPLASTY    . LITHOTRIPSY     and stone extraction    FAMILY HISTORY :   Family History  Problem Relation Age of Onset  . Breast cancer Mother   . Lung cancer Father     SOCIAL HISTORY:   Social History  Substance Use Topics  . Smoking status: Former Smoker    Types: Cigarettes    Quit date: 11/29/1993  . Smokeless tobacco: Never Used  . Alcohol use No    ALLERGIES:  is allergic to penicillins; aspirin; ferrous gluconate; iron polysaccharide; nsaids; other; sulfamethoxazole-trimethoprim; tolmetin; and venofer [iron sucrose].  MEDICATIONS:  Current Outpatient Prescriptions  Medication Sig Dispense Refill  . acetaminophen (TYLENOL) 325 MG tablet Take 2 tablets (650 mg total) by mouth every 6 (six) hours as needed for mild pain (or Fever >/= 101).    . ARIPiprazole (ABILIFY) 2  MG tablet Take 1 tablet (2 mg total) by mouth daily. 30 tablet 0  . calcium-vitamin D (OSCAL WITH D) 500-200 MG-UNIT tablet Take 1 tablet by mouth daily with breakfast.    . FLUoxetine (PROZAC) 10 MG capsule Take 3 capsules (30 mg total) by mouth daily.  3  . furosemide (LASIX) 20 MG tablet Take 2 tablets by mouth daily.    Marland Kitchen latanoprost (XALATAN) 0.005 % ophthalmic solution Place 1 drop into both eyes at bedtime.     . mirtazapine (REMERON SOL-TAB) 15 MG disintegrating tablet Take 0.5 tablets (7.5 mg total) by mouth at bedtime.    . Multiple Vitamin (MULTIVITAMIN) tablet Take 1 tablet  by mouth daily.    . ondansetron (ZOFRAN-ODT) 4 MG disintegrating tablet 4 mg every 8 (eight) hours as needed.     Ethelda Chick (OYSTER CALCIUM) 500 MG TABS tablet Take 500 mg of elemental calcium by mouth daily.    . pantoprazole (PROTONIX) 40 MG tablet Take 40 mg by mouth daily.     . polyethylene glycol (MIRALAX / GLYCOLAX) packet Take 17 g by mouth daily.    . potassium chloride (K-DUR,KLOR-CON) 10 MEQ tablet Take 1 tablet by mouth daily.    . SUMAtriptan (IMITREX) 100 MG tablet 100 mg every 2 (two) hours as needed.     . trolamine salicylate (ASPERCREME) 10 % cream Apply 1 application topically as needed for muscle pain.     No current facility-administered medications for this visit.     PHYSICAL EXAMINATION:  BP 106/70 (BP Location: Left Arm, Patient Position: Sitting)   Pulse 85   Temp 97.6 F (36.4 C) (Tympanic)   Resp 20   Ht  (1.575 m)   Wt 90 lb (40.8 kg)   BMI 16.46 kg/m   Filed Weights   04/23/17 1407  Weight: 90 lb (40.8 kg)    GENERAL: Thin built moderately nourished female patient Alert, no distress and comfortable. Accompanied by her husband. EYES: no pallor or icterus OROPHARYNX: no thrush or ulceration; good dentition  NECK: supple, no masses felt LYMPH:  no palpable lymphadenopathy in the cervical, axillary or inguinal regions LUNGS: clear to auscultation and  No wheeze or crackles HEART/CVS: regular rate & rhythm and no murmurs; bilateral 2+ lower extremity edema ABDOMEN:abdomen soft, non-tender and normal bowel sounds Musculoskeletal:no cyanosis of digits and no clubbing  PSYCH: alert & oriented x 3 with fluent speech NEURO: no focal motor/sensory deficits SKIN:  no rashes or significant lesions  LABORATORY DATA:  I have reviewed the data as listed    Component Value Date/Time   NA 133 (L) 12/31/2016 0407   NA 139 11/10/2014 0503   K 4.0 12/31/2016 0407   K 3.6 11/10/2014 0503   CL 97 (L) 12/31/2016 0407   CL 107 11/10/2014 0503   CO2  30 12/31/2016 0407   CO2 24 11/10/2014 0503   GLUCOSE 84 12/31/2016 0407   GLUCOSE 84 11/10/2014 0503   BUN 13 12/31/2016 0407   BUN 7 11/10/2014 0503   CREATININE 0.44 12/31/2016 0407   CREATININE 0.56 11/10/2014 0503   CALCIUM 8.1 (L) 12/31/2016 0407   CALCIUM 7.8 (L) 11/10/2014 0503   PROT 5.4 (L) 12/27/2016 1537   PROT 6.9 11/09/2014 1254   ALBUMIN 2.4 (L) 12/27/2016 1537   ALBUMIN 3.5 11/09/2014 1254   AST 21 12/27/2016 1537   AST 19 11/09/2014 1254   ALT 21 12/27/2016 1537   ALT 15 11/09/2014 1254   ALKPHOS  113 12/27/2016 1537   ALKPHOS 155 (H) 11/09/2014 1254   BILITOT 0.6 12/27/2016 1537   BILITOT 0.3 11/09/2014 1254   GFRNONAA >60 12/31/2016 0407   GFRNONAA >60 11/10/2014 0503   GFRAA >60 12/31/2016 0407   GFRAA >60 11/10/2014 0503    No results found for: SPEP, UPEP  Lab Results  Component Value Date   WBC 9.1 04/23/2017   NEUTROABS 6.9 (H) 04/23/2017   HGB 12.0 04/23/2017   HCT 36.8 04/23/2017   MCV 74.9 (L) 04/23/2017   PLT 421 04/23/2017      Chemistry      Component Value Date/Time   NA 133 (L) 12/31/2016 0407   NA 139 11/10/2014 0503   K 4.0 12/31/2016 0407   K 3.6 11/10/2014 0503   CL 97 (L) 12/31/2016 0407   CL 107 11/10/2014 0503   CO2 30 12/31/2016 0407   CO2 24 11/10/2014 0503   BUN 13 12/31/2016 0407   BUN 7 11/10/2014 0503   CREATININE 0.44 12/31/2016 0407   CREATININE 0.56 11/10/2014 0503      Component Value Date/Time   CALCIUM 8.1 (L) 12/31/2016 0407   CALCIUM 7.8 (L) 11/10/2014 0503   ALKPHOS 113 12/27/2016 1537   ALKPHOS 155 (H) 11/09/2014 1254   AST 21 12/27/2016 1537   AST 19 11/09/2014 1254   ALT 21 12/27/2016 1537   ALT 15 11/09/2014 1254   BILITOT 0.6 12/27/2016 1537   BILITOT 0.3 11/09/2014 1254       RADIOGRAPHIC STUDIES: I have personally reviewed the radiological images as listed and agreed with the findings in the report. US Venous Img Lower Bilateral  Result Date: 04/23/2017 CLINICAL DATA:  Bilateral  lower extremity edema. EXAM: BILATERAL LOWER EXTREMITY VENOUS DOPPLER ULTRASOUND TECHNIQUE: Gray-scale sonography with graded compression, as well as color Doppler and duplex ultrasound were performed to evaluate the lower extremity deep venous systems from the level of the common femoral vein and including the common femoral, femoral, profunda femoral, popliteal and calf veins including the posterior tibial, peroneal and gastrocnemius veins when visible. The superficial great saphenous vein was also interrogated. Spectral Doppler was utilized to evaluate flow at rest and with distal augmentation maneuvers in the common femoral, femoral and popliteal veins. COMPARISON:  None. FINDINGS: RIGHT LOWER EXTREMITY Common Femoral Vein: No evidence of thrombus. Normal compressibility, respiratory phasicity and response to augmentation. Saphenofemoral Junction: No evidence of thrombus. Normal compressibility and flow on color Doppler imaging. Profunda Femoral Vein: No evidence of thrombus. Normal compressibility and flow on color Doppler imaging. Femoral Vein: No evidence of thrombus. Normal compressibility, respiratory phasicity and response to augmentation. Popliteal Vein: No evidence of thrombus. Normal compressibility, respiratory phasicity and response to augmentation. Calf Veins: No evidence of thrombus. Normal compressibility and flow on color Doppler imaging. Superficial Great Saphenous Vein: No evidence of thrombus. Normal compressibility and flow on color Doppler imaging. Venous Reflux:  None. Other Findings:  None. LEFT LOWER EXTREMITY Common Femoral Vein: No evidence of thrombus. Normal compressibility, respiratory phasicity and response to augmentation. Saphenofemoral Junction: No evidence of thrombus. Normal compressibility and flow on color Doppler imaging. Profunda Femoral Vein: No evidence of thrombus. Normal compressibility and flow on color Doppler imaging. Femoral Vein: No evidence of thrombus. Normal  compressibility, respiratory phasicity and response to augmentation. Popliteal Vein: No evidence of thrombus. Normal compressibility, respiratory phasicity and response to augmentation. Calf Veins: No evidence of thrombus. Normal compressibility and flow on color Doppler imaging. Superficial Great Saphenous Vein: No evidence of  thrombus. Normal compressibility and flow on color Doppler imaging. Venous Reflux:  None. Other Findings:  None. IMPRESSION: No evidence of DVT within either lower extremity. Electronically Signed   By: Lupita Raider, M.D.   On: 04/23/2017 16:44     ASSESSMENT & PLAN:  Iron deficiency anemia due to chronic blood loss # Anemia- multifactorial/iron deficiency-B12 deficiency secondary to previous GI surgeries. Today hemoglobin is 12.9 [previously 9-10]. Proceed with IV Venofer today.  #  B12 deficiency- continue B12 shots every 2 months; Today.   # Bil LE swelling- question DVTs versus others. Recommend stat lower extremity venous Dopplers.  #  Recheck CBC/ ferritin every 4 months/ possible IV iron; 2 months B12.   Addendum; bilateral lower extremity venous Dopplers negative for DVT.      Earna Coder, MD 04/23/2017 7:20 PM

## 2017-04-23 NOTE — Progress Notes (Signed)
Patient here for follow-up for h/o IDA 

## 2017-04-24 ENCOUNTER — Inpatient Hospital Stay: Payer: Medicare Other

## 2017-04-25 NOTE — Telephone Encounter (Signed)
x

## 2017-05-09 ENCOUNTER — Other Ambulatory Visit: Payer: Self-pay | Admitting: Cardiology

## 2017-05-09 DIAGNOSIS — I272 Pulmonary hypertension, unspecified: Secondary | ICD-10-CM

## 2017-05-12 DIAGNOSIS — I272 Pulmonary hypertension, unspecified: Secondary | ICD-10-CM | POA: Insufficient documentation

## 2017-05-16 ENCOUNTER — Ambulatory Visit
Admission: RE | Admit: 2017-05-16 | Discharge: 2017-05-16 | Disposition: A | Discharge status: Home / Self Care | Payer: Medicare Other | Source: Ambulatory Visit | Attending: Cardiology | Admitting: Cardiology

## 2017-05-16 DIAGNOSIS — I272 Pulmonary hypertension, unspecified: Secondary | ICD-10-CM | POA: Insufficient documentation

## 2017-05-16 DIAGNOSIS — J9811 Atelectasis: Secondary | ICD-10-CM | POA: Insufficient documentation

## 2017-05-16 DIAGNOSIS — I700 Atherosclerosis of aorta: Secondary | ICD-10-CM | POA: Diagnosis not present

## 2017-05-16 DIAGNOSIS — I2510 Atherosclerotic heart disease of native coronary artery without angina pectoris: Secondary | ICD-10-CM | POA: Diagnosis not present

## 2017-05-16 LAB — POCT I-STAT CREATININE: CREATININE: 0.7 mg/dL (ref 0.44–1.00)

## 2017-05-16 MED ORDER — IOPAMIDOL (ISOVUE-370) INJECTION 76%
75.0000 mL | Freq: Once | INTRAVENOUS | Status: AC | PRN
  Administered 2017-05-16: 75 mL via INTRAVENOUS

## 2017-06-10 ENCOUNTER — Ambulatory Visit: Payer: Medicare Other | Admitting: Cardiovascular Disease

## 2017-06-25 ENCOUNTER — Inpatient Hospital Stay: Payer: Medicare Other | Attending: Internal Medicine

## 2017-06-25 DIAGNOSIS — E538 Deficiency of other specified B group vitamins: Secondary | ICD-10-CM | POA: Insufficient documentation

## 2017-06-25 DIAGNOSIS — D509 Iron deficiency anemia, unspecified: Secondary | ICD-10-CM

## 2017-06-25 DIAGNOSIS — Z79899 Other long term (current) drug therapy: Secondary | ICD-10-CM | POA: Insufficient documentation

## 2017-06-25 MED ORDER — CYANOCOBALAMIN 1000 MCG/ML IJ SOLN
1000.0000 ug | Freq: Once | INTRAMUSCULAR | Status: AC
  Administered 2017-06-25: 1000 ug via INTRAMUSCULAR

## 2017-09-03 ENCOUNTER — Inpatient Hospital Stay: Payer: Medicare Other

## 2017-09-03 ENCOUNTER — Inpatient Hospital Stay (HOSPITAL_BASED_OUTPATIENT_CLINIC_OR_DEPARTMENT_OTHER): Payer: Medicare Other | Admitting: Internal Medicine

## 2017-09-03 ENCOUNTER — Inpatient Hospital Stay: Payer: Medicare Other | Attending: Internal Medicine

## 2017-09-03 ENCOUNTER — Other Ambulatory Visit: Payer: Self-pay | Admitting: *Deleted

## 2017-09-03 ENCOUNTER — Encounter: Payer: Self-pay | Admitting: Internal Medicine

## 2017-09-03 VITALS — BP 112/73 | HR 73 | Temp 98.0°F | Resp 17

## 2017-09-03 VITALS — BP 109/70 | HR 99 | Temp 97.1°F | Resp 16 | Wt 110.0 lb

## 2017-09-03 DIAGNOSIS — E538 Deficiency of other specified B group vitamins: Secondary | ICD-10-CM

## 2017-09-03 DIAGNOSIS — Z803 Family history of malignant neoplasm of breast: Secondary | ICD-10-CM | POA: Insufficient documentation

## 2017-09-03 DIAGNOSIS — Z79899 Other long term (current) drug therapy: Secondary | ICD-10-CM

## 2017-09-03 DIAGNOSIS — D500 Iron deficiency anemia secondary to blood loss (chronic): Secondary | ICD-10-CM | POA: Insufficient documentation

## 2017-09-03 DIAGNOSIS — Z87891 Personal history of nicotine dependence: Secondary | ICD-10-CM | POA: Diagnosis not present

## 2017-09-03 DIAGNOSIS — Z801 Family history of malignant neoplasm of trachea, bronchus and lung: Secondary | ICD-10-CM

## 2017-09-03 DIAGNOSIS — D509 Iron deficiency anemia, unspecified: Secondary | ICD-10-CM

## 2017-09-03 LAB — IRON AND TIBC
Iron: 35 ug/dL (ref 28–170)
Saturation Ratios: 9 % — ABNORMAL LOW (ref 10.4–31.8)
TIBC: 396 ug/dL (ref 250–450)
UIBC: 361 ug/dL

## 2017-09-03 LAB — BASIC METABOLIC PANEL
ANION GAP: 11 (ref 5–15)
BUN: 19 mg/dL (ref 6–20)
CO2: 25 mmol/L (ref 22–32)
Calcium: 9 mg/dL (ref 8.9–10.3)
Chloride: 104 mmol/L (ref 101–111)
Creatinine, Ser: 0.73 mg/dL (ref 0.44–1.00)
Glucose, Bld: 130 mg/dL — ABNORMAL HIGH (ref 65–99)
POTASSIUM: 4.2 mmol/L (ref 3.5–5.1)
SODIUM: 140 mmol/L (ref 135–145)

## 2017-09-03 LAB — CBC WITH DIFFERENTIAL/PLATELET
BASOS ABS: 0.1 10*3/uL (ref 0–0.1)
Basophils Relative: 1 %
EOS PCT: 1 %
Eosinophils Absolute: 0.1 10*3/uL (ref 0–0.7)
HCT: 41.2 % (ref 35.0–47.0)
HEMOGLOBIN: 13.3 g/dL (ref 12.0–16.0)
LYMPHS PCT: 15 %
Lymphs Abs: 1.2 10*3/uL (ref 1.0–3.6)
MCH: 28.5 pg (ref 26.0–34.0)
MCHC: 32.4 g/dL (ref 32.0–36.0)
MCV: 88.1 fL (ref 80.0–100.0)
Monocytes Absolute: 0.5 10*3/uL (ref 0.2–0.9)
Monocytes Relative: 6 %
Neutro Abs: 5.8 10*3/uL (ref 1.4–6.5)
Neutrophils Relative %: 77 %
PLATELETS: 259 10*3/uL (ref 150–440)
RBC: 4.68 MIL/uL (ref 3.80–5.20)
RDW: 14.6 % — ABNORMAL HIGH (ref 11.5–14.5)
WBC: 7.7 10*3/uL (ref 3.6–11.0)

## 2017-09-03 LAB — FERRITIN: FERRITIN: 42 ng/mL (ref 11–307)

## 2017-09-03 MED ORDER — SODIUM CHLORIDE 0.9 % IV SOLN
Freq: Once | INTRAVENOUS | Status: AC
  Administered 2017-09-03: 15:00:00 via INTRAVENOUS
  Filled 2017-09-03: qty 1000

## 2017-09-03 MED ORDER — CYANOCOBALAMIN 1000 MCG/ML IJ SOLN
1000.0000 ug | Freq: Once | INTRAMUSCULAR | Status: AC
  Administered 2017-09-03: 1000 ug via INTRAMUSCULAR
  Filled 2017-09-03: qty 1

## 2017-09-03 MED ORDER — SODIUM CHLORIDE 0.9 % IV SOLN
510.0000 mg | Freq: Once | INTRAVENOUS | Status: AC
  Administered 2017-09-03: 510 mg via INTRAVENOUS
  Filled 2017-09-03: qty 17

## 2017-09-03 MED ORDER — DIPHENHYDRAMINE HCL 50 MG/ML IJ SOLN
25.0000 mg | Freq: Once | INTRAMUSCULAR | Status: AC
  Administered 2017-09-03: 25 mg via INTRAVENOUS
  Filled 2017-09-03: qty 1

## 2017-09-03 NOTE — Progress Notes (Signed)
[facility] OFFICE PROGRESS NOTE  Patient Care Team: Marisue IvanLinthavong, Kanhka, MD as PCP - General (Family Medicine)   SUMMARY OF ONCOLOGIC HISTORY:  # ANEMIA- Multifactorial- Iron def/ B12 def [previous stomach surgeries/PUD]- IV iron last October 2016/  # B12  IM injections q 2 MONTHS [[facility]]  INTERVAL HISTORY:  A pleasant 77 year old female patient with above history of anemia likely secondary to malabsorption/question chronic GI blood loss- currently on IV iron/B12 shot is here for follow-up.    Patient is currently in rehab status post hip fracture many months ago..  Complains of mild fatigue.   Denies any blood in stool occult stools. She has poor tolerance of by mouth iron.   REVIEW OF SYSTEMS:  A complete 10 point review of system is done which is negative except mentioned above/history of present illness.   PAST MEDICAL HISTORY :  Past Medical History:  Diagnosis Date  . Arthritis   . B12 deficiency   . CHF (congestive heart failure) (HCC)   . Chronic systolic heart failure (HCC)   . Depression   . Dysrhythmia    TACHYCARDIA  . Failure to thrive (0-17)   . GERD (gastroesophageal reflux disease)   . Glaucoma   . Headache    MIGRAINES  . History of kidney stones   . Hypercholesterolemia   . IDA (iron deficiency anemia)   . Migraine   . Mood disorder (HCC)   . Multiple gastric ulcers   . Osteoporosis   . Renal disorder    Multiple episodes of kidney stones.     PAST SURGICAL HISTORY :   Past Surgical History:  Procedure Laterality Date  . ABDOMINAL SURGERY     Reconstructed stomach and 3 different surgeries.   . APPENDECTOMY    . BREAST SURGERY     cyst removal  . CATARACT EXTRACTION W/PHACO Right 06/05/2015   Procedure: CATARACT EXTRACTION PHACO AND INTRAOCULAR LENS PLACEMENT (IOC);  Surgeon: Lockie Molahadwick Brasington, MD;  Location: [facility] ORS;  Service: Ophthalmology;  Laterality: Right;  [place] 01:06   . CESAREAN SECTION     x 3  . CHOLECYSTECTOMY    .  COLONOSCOPY    . ESOPHAGOGASTRODUODENOSCOPY    . INTRAMEDULLARY (IM) NAIL INTERTROCHANTERIC Left 12/29/2016   Procedure: INTRAMEDULLARY (IM) NAIL INTERTROCHANTRIC;  Surgeon: Kennedy BuckerMenz, Michael, MD;  Location: [facility] ORS;  Service: Orthopedics;  Laterality: Left;  . KYPHOPLASTY    . KYPHOPLASTY    . LITHOTRIPSY     and stone extraction    FAMILY HISTORY :   Family History  Problem Relation Age of Onset  . Breast cancer Mother   . Lung cancer Father     SOCIAL HISTORY:   Social History   Tobacco Use  . Smoking status: Former Smoker    Types: Cigarettes    Last attempt to quit: 11/29/1993    Years since quitting: 23.7  . Smokeless tobacco: Never Used  Substance Use Topics  . Alcohol use: No    Alcohol/week: 0.6 oz    Types: 1 Standard drinks or equivalent per week  . Drug use: No    ALLERGIES:  is allergic to penicillins; aspirin; ferrous gluconate; iron polysaccharide; nsaids; other; sulfamethoxazole-trimethoprim; tolmetin; and venofer [iron sucrose].  MEDICATIONS:  Current Outpatient Medications  Medication Sig Dispense Refill  . acetaminophen (TYLENOL) 325 MG tablet Take 2 tablets (650 mg total) by mouth every 6 (six) hours as needed for mild pain (or Fever >/= 101).    . ARIPiprazole (ABILIFY) 2  MG tablet Take 1 tablet (2 mg total) by mouth daily. 30 tablet 0  . calcium-vitamin D (OSCAL WITH D) 500-200 MG-UNIT tablet Take 1 tablet by mouth daily with breakfast.    . FLUoxetine (PROZAC) 10 MG capsule Take 3 capsules (30 mg total) by mouth daily.  3  . furosemide (LASIX) 20 MG tablet Take 2 tablets by mouth daily.    . Multiple Vitamin (MULTIVITAMIN) tablet Take 1 tablet by mouth daily.    Ethelda Chick (OYSTER CALCIUM) 500 MG TABS tablet Take 500 mg of elemental calcium by mouth daily.    . pantoprazole (PROTONIX) 40 MG tablet Take 40 mg by mouth daily.     . polyethylene glycol (MIRALAX / GLYCOLAX) packet Take 17 g by mouth daily.    . potassium chloride (K-DUR,KLOR-CON) 10 MEQ  tablet Take 1 tablet by mouth daily.    . SUMAtriptan (IMITREX) 100 MG tablet 100 mg every 2 (two) hours as needed.     . traZODone (DESYREL) 50 MG tablet Take 50 mg by mouth at bedtime.    . trolamine salicylate (ASPERCREME) 10 % cream Apply 1 application topically as needed for muscle pain.    Marland Kitchen latanoprost (XALATAN) 0.005 % ophthalmic solution Place 1 drop into both eyes at bedtime.     . mirtazapine (REMERON SOL-TAB) 15 MG disintegrating tablet Take 0.5 tablets (7.5 mg total) by mouth at bedtime. (Patient not taking: Reported on 09/03/2017)    . ondansetron (ZOFRAN-ODT) 4 MG disintegrating tablet 4 mg every 8 (eight) hours as needed.      No current facility-administered medications for this visit.     PHYSICAL EXAMINATION:  BP 109/70 (BP Location: Left Arm, Patient Position: Sitting)   Pulse 99   Temp (!) 97.1 F (36.2 C) (Tympanic)   Resp 16   Wt 110 lb (49.9 kg)   BMI 20.12 kg/m   Filed Weights   09/03/17 1416  Weight: 110 lb (49.9 kg)    GENERAL: Thin built moderately nourished female patient Alert, no distress and comfortable. Accompanied by her husband. EYES: no pallor or icterus OROPHARYNX: no thrush or ulceration; good dentition  NECK: supple, no masses felt LYMPH:  no palpable lymphadenopathy in the cervical, axillary or inguinal regions LUNGS: clear to auscultation and  No wheeze or crackles HEART/CVS: regular rate & rhythm and no murmurs; no bilateral lower extremity edema ABDOMEN:abdomen soft, non-tender and normal bowel sounds Musculoskeletal:no cyanosis of digits and no clubbing  PSYCH: alert & oriented x 3 with fluent speech NEURO: no focal motor/sensory deficits SKIN:  no rashes or significant lesions  LABORATORY DATA:  I have reviewed the data as listed    Component Value Date/Time   NA 140 09/03/2017 1348   NA 139 11/10/2014 0503   K 4.2 09/03/2017 1348   K 3.6 11/10/2014 0503   CL 104 09/03/2017 1348   CL 107 11/10/2014 0503   CO2 25 09/03/2017  1348   CO2 24 11/10/2014 0503   GLUCOSE 130 (H) 09/03/2017 1348   GLUCOSE 84 11/10/2014 0503   BUN 19 09/03/2017 1348   BUN 7 11/10/2014 0503   CREATININE 0.73 09/03/2017 1348   CREATININE 0.56 11/10/2014 0503   CALCIUM 9.0 09/03/2017 1348   CALCIUM 7.8 (L) 11/10/2014 0503   PROT 5.4 (L) 12/27/2016 1537   PROT 6.9 11/09/2014 1254   ALBUMIN 2.4 (L) 12/27/2016 1537   ALBUMIN 3.5 11/09/2014 1254   AST 21 12/27/2016 1537   AST 19 11/09/2014 1254  ALT 21 12/27/2016 1537   ALT 15 11/09/2014 1254   ALKPHOS 113 12/27/2016 1537   ALKPHOS 155 (H) 11/09/2014 1254   BILITOT 0.6 12/27/2016 1537   BILITOT 0.3 11/09/2014 1254   GFRNONAA >60 09/03/2017 1348   GFRNONAA >60 11/10/2014 0503   GFRAA >60 09/03/2017 1348   GFRAA >60 11/10/2014 0503    No results found for: SPEP, UPEP  Lab Results  Component Value Date   WBC 7.7 09/03/2017   NEUTROABS 5.8 09/03/2017   HGB 13.3 09/03/2017   HCT 41.2 09/03/2017   MCV 88.1 09/03/2017   PLT 259 09/03/2017      Chemistry      Component Value Date/Time   NA 140 09/03/2017 1348   NA 139 11/10/2014 0503   K 4.2 09/03/2017 1348   K 3.6 11/10/2014 0503   CL 104 09/03/2017 1348   CL 107 11/10/2014 0503   CO2 25 09/03/2017 1348   CO2 24 11/10/2014 0503   BUN 19 09/03/2017 1348   BUN 7 11/10/2014 0503   CREATININE 0.73 09/03/2017 1348   CREATININE 0.56 11/10/2014 0503      Component Value Date/Time   CALCIUM 9.0 09/03/2017 1348   CALCIUM 7.8 (L) 11/10/2014 0503   ALKPHOS 113 12/27/2016 1537   ALKPHOS 155 (H) 11/09/2014 1254   AST 21 12/27/2016 1537   AST 19 11/09/2014 1254   ALT 21 12/27/2016 1537   ALT 15 11/09/2014 1254   BILITOT 0.6 12/27/2016 1537   BILITOT 0.3 11/09/2014 1254       RADIOGRAPHIC STUDIES: I have personally reviewed the radiological images as listed and agreed with the findings in the report. No results found.   ASSESSMENT & PLAN:  Iron deficiency anemia due to chronic blood loss # Anemia-  multifactorial/iron deficiency-B12 deficiency secondary to previous GI surgeries. Today hemoglobin is 13.3 [previously 9-10]. Proceed with IV iron today.  #  B12 deficiency- continue B12 shots every 3 months; Today.   # follow up in 3 months/ b12; follow up in 6 months/b12/cbc-/iron studies ferritin/MD.       Earna Coder, MD 09/04/2017 8:32 AM

## 2017-09-03 NOTE — Assessment & Plan Note (Addendum)
#   Anemia- multifactorial/iron deficiency-B12 deficiency secondary to previous GI surgeries. Today hemoglobin is 13.3 [previously 9-10]. Proceed with IV iron today.  #  B12 deficiency- continue B12 shots every 3 months; Today.   # follow up in 3 months/ b12; follow up in 6 months/b12/cbc-/iron studies ferritin/MD.

## 2017-09-03 NOTE — Progress Notes (Signed)
1605: pt declined to stay full 30 minute observation. Pt and VS stable at discharge.

## 2017-09-19 ENCOUNTER — Emergency Department: Payer: Medicare Other

## 2017-09-19 ENCOUNTER — Other Ambulatory Visit: Payer: Self-pay

## 2017-09-19 ENCOUNTER — Emergency Department
Admission: EM | Admit: 2017-09-19 | Discharge: 2017-09-19 | Disposition: A | Discharge status: Skilled Nursing Facility | Payer: Medicare Other | Attending: Emergency Medicine | Admitting: Emergency Medicine

## 2017-09-19 ENCOUNTER — Encounter: Payer: Self-pay | Admitting: Emergency Medicine

## 2017-09-19 DIAGNOSIS — Y999 Unspecified external cause status: Secondary | ICD-10-CM | POA: Diagnosis not present

## 2017-09-19 DIAGNOSIS — Z87891 Personal history of nicotine dependence: Secondary | ICD-10-CM | POA: Insufficient documentation

## 2017-09-19 DIAGNOSIS — W06XXXA Fall from bed, initial encounter: Secondary | ICD-10-CM | POA: Diagnosis not present

## 2017-09-19 DIAGNOSIS — Y9389 Activity, other specified: Secondary | ICD-10-CM | POA: Diagnosis not present

## 2017-09-19 DIAGNOSIS — Y92122 Bedroom in nursing home as the place of occurrence of the external cause: Secondary | ICD-10-CM | POA: Diagnosis not present

## 2017-09-19 DIAGNOSIS — M549 Dorsalgia, unspecified: Secondary | ICD-10-CM | POA: Insufficient documentation

## 2017-09-19 DIAGNOSIS — E860 Dehydration: Secondary | ICD-10-CM | POA: Diagnosis not present

## 2017-09-19 DIAGNOSIS — S0990XA Unspecified injury of head, initial encounter: Secondary | ICD-10-CM | POA: Diagnosis present

## 2017-09-19 DIAGNOSIS — I5022 Chronic systolic (congestive) heart failure: Secondary | ICD-10-CM | POA: Diagnosis not present

## 2017-09-19 DIAGNOSIS — W19XXXA Unspecified fall, initial encounter: Secondary | ICD-10-CM

## 2017-09-19 LAB — URINALYSIS, COMPLETE (UACMP) WITH MICROSCOPIC
BILIRUBIN URINE: NEGATIVE
Bacteria, UA: NONE SEEN
GLUCOSE, UA: NEGATIVE mg/dL
KETONES UR: 20 mg/dL — AB
Leukocytes, UA: NEGATIVE
Nitrite: NEGATIVE
PROTEIN: 100 mg/dL — AB
SQUAMOUS EPITHELIAL / LPF: NONE SEEN
Specific Gravity, Urine: 1.02 (ref 1.005–1.030)
pH: 5 (ref 5.0–8.0)

## 2017-09-19 LAB — CBC
HCT: 42.4 % (ref 35.0–47.0)
HEMOGLOBIN: 13.6 g/dL (ref 12.0–16.0)
MCH: 28.7 pg (ref 26.0–34.0)
MCHC: 32.2 g/dL (ref 32.0–36.0)
MCV: 89.1 fL (ref 80.0–100.0)
Platelets: 168 10*3/uL (ref 150–440)
RBC: 4.76 MIL/uL (ref 3.80–5.20)
RDW: 16 % — ABNORMAL HIGH (ref 11.5–14.5)
WBC: 11.1 10*3/uL — ABNORMAL HIGH (ref 3.6–11.0)

## 2017-09-19 LAB — BASIC METABOLIC PANEL
ANION GAP: 12 (ref 5–15)
BUN: 21 mg/dL — ABNORMAL HIGH (ref 6–20)
CHLORIDE: 98 mmol/L — AB (ref 101–111)
CO2: 25 mmol/L (ref 22–32)
Calcium: 8.9 mg/dL (ref 8.9–10.3)
Creatinine, Ser: 0.73 mg/dL (ref 0.44–1.00)
GFR calc non Af Amer: >60 mL/min (ref >60)
Glucose, Bld: 117 mg/dL — ABNORMAL HIGH (ref 65–99)
POTASSIUM: 3.4 mmol/L — AB (ref 3.5–5.1)
Sodium: 135 mmol/L (ref 135–145)

## 2017-09-19 MED ORDER — POTASSIUM CHLORIDE 20 MEQ/15ML (10%) PO SOLN
20.0000 meq | Freq: Once | ORAL | Status: AC
  Administered 2017-09-19: 20 meq via ORAL
  Filled 2017-09-19: qty 15

## 2017-09-19 MED ORDER — SODIUM CHLORIDE 0.9 % IV BOLUS (SEPSIS)
500.0000 mL | Freq: Once | INTRAVENOUS | Status: AC
  Administered 2017-09-19: 500 mL via INTRAVENOUS

## 2017-09-19 NOTE — Discharge Instructions (Signed)

## 2017-09-19 NOTE — ED Provider Notes (Signed)
[facility] Emergency Department Provider Note   ____________________________________________   First MD Initiated Contact with Patient 09/19/17 1036     (approximate)  I have reviewed the triage vital signs and the nursing notes.   HISTORY  Chief Complaint Fall and Dehydration  EM caveat: Very poor historian, unreliable  HPI Ashley Young is a 77 y.o. female history of previous fall, congestive heart failure, failure to thrive, anemia  Patient herself very poor historian.  Unable to provide history  Spoke with the patient's husband, he reports that the patient is reside at assisted living for about the last 6 months/memory care.  She had a relatively rapid decline in her health after a fall and orthopedic injuries that she suffered about a year ago.  He does report that she is generally feeling very weak and frail at baseline.  He spent the evening with her last night, this morning she had fallen out of the bed, and was found next to the bed.  She was in bed when he had left her, and believe she may have fallen in the last short timeframe.  No known injury.   Husband reports hard to put a finger on any one particular illness, but he does report that the last few days to week he is noticed a somewhat seemingly worse decline in her with increased weakness and and fatigue in general.   Past Medical History:  Diagnosis Date  . Arthritis   . B12 deficiency   . CHF (congestive heart failure) (HCC)   . Chronic systolic heart failure (HCC)   . Depression   . Dysrhythmia    TACHYCARDIA  . Failure to thrive (0-17)   . GERD (gastroesophageal reflux disease)   . Glaucoma   . Headache    MIGRAINES  . History of kidney stones   . Hypercholesterolemia   . IDA (iron deficiency anemia)   . Migraine   . Mood disorder (HCC)   . Multiple gastric ulcers   . Osteoporosis   . Renal disorder    Multiple episodes of kidney stones.     Patient Active  Problem List   Diagnosis Date Noted  . Mood disorder (HCC)   . DNR (do not resuscitate) 12/30/2016  . Hip fracture, left (HCC)   . Palliative care encounter   . Hypokalemia 12/27/2016  . Severe recurrent major depression without psychotic features (HCC) 12/24/2016  . Failure to thrive (0-17) 12/24/2016  . Chronic systolic heart failure (HCC) 01/12/2016  . Borderline diabetes mellitus 01/02/2016  . Pure hypercholesterolemia 01/02/2016  . Goals of care, counseling/discussion 01/02/2016  . B12 deficiency 04/26/2015  . Iron deficiency anemia due to chronic blood loss 04/26/2015  . History of anemia 03/28/2015  . 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 DDD (degenerative disc disease), lumbar 03/09/2014  . Degeneration of intervertebral disc of lumbar region 03/09/2014  . Avitaminosis D 02/09/2014  . Anxiety state 01/04/2014  . Clinical depression 01/04/2014  . Bilateral lower extremity edema 01/04/2014  . Recurrent major depression in remission (HCC) 01/04/2014  . Abdominal pain 11/19/2013  . Acquired gastric outlet stenosis September 08, 2013  . Adverse effect of salicylate Sep 08, 2013  . Anxiety and depression 2013-09-08  . Esophagitis 09/08/2013  . H/O gastrointestinal hemorrhage 09/08/2013  . H/O gastric ulcer 09/08/13  . Disorder of nutrition 09-08-2013  . Headache, migraine September 08, 2013  . Calculus of kidney 09-08-2013  . OP (osteoporosis) 08-Sep-2013    Past Surgical History:  Procedure Laterality Date  . ABDOMINAL  SURGERY     Reconstructed stomach and 3 different surgeries.   . APPENDECTOMY    . BREAST SURGERY     cyst removal  . CATARACT EXTRACTION W/PHACO Right 06/05/2015   Procedure: CATARACT EXTRACTION PHACO AND INTRAOCULAR LENS PLACEMENT (IOC);  Surgeon: Lockie Mola, MD;  Location: [facility] ORS;  Service: Ophthalmology;  Laterality: Right;  [place] 01:06   . CESAREAN SECTION     x 3  . CHOLECYSTECTOMY    . COLONOSCOPY    . ESOPHAGOGASTRODUODENOSCOPY    .  INTRAMEDULLARY (IM) NAIL INTERTROCHANTERIC Left 12/29/2016   Procedure: INTRAMEDULLARY (IM) NAIL INTERTROCHANTRIC;  Surgeon: Kennedy Bucker, MD;  Location: [facility] ORS;  Service: Orthopedics;  Laterality: Left;  . KYPHOPLASTY    . KYPHOPLASTY    . LITHOTRIPSY     and stone extraction    Prior to Admission medications   Medication Sig Start Date End Date Taking? Authorizing Provider  acetaminophen (TYLENOL) 325 MG tablet Take 2 tablets (650 mg total) by mouth every 6 (six) hours as needed for mild pain (or Fever >/= 101). 12/31/16   Auburn Bilberry, MD  ARIPiprazole (ABILIFY) 2 MG tablet Take 1 tablet (2 mg total) by mouth daily. 01/01/17   Auburn Bilberry, MD  calcium-vitamin D (OSCAL WITH D) 500-200 MG-UNIT tablet Take 1 tablet by mouth daily with breakfast.    [provider]  FLUoxetine (PROZAC) 10 MG capsule Take 3 capsules (30 mg total) by mouth daily. 01/01/17   Auburn Bilberry, MD  furosemide (LASIX) 20 MG tablet Take 2 tablets by mouth daily. 03/02/17   [provider]  latanoprost (XALATAN) 0.005 % ophthalmic solution Place 1 drop into both eyes at bedtime.  11/18/14   [provider]  mirtazapine (REMERON SOL-TAB) 15 MG disintegrating tablet Take 0.5 tablets (7.5 mg total) by mouth at bedtime. Patient not taking: Reported on 09/03/2017 12/31/16   Auburn Bilberry, MD  Multiple Vitamin (MULTIVITAMIN) tablet Take 1 tablet by mouth daily.    [provider]  ondansetron (ZOFRAN-ODT) 4 MG disintegrating tablet 4 mg every 8 (eight) hours as needed.  10/06/14   [provider]  Ethelda Chick (OYSTER CALCIUM) 500 MG TABS tablet Take 500 mg of elemental calcium by mouth daily.    [provider]  pantoprazole (PROTONIX) 40 MG tablet Take 40 mg by mouth daily.  09/08/14   [provider]  polyethylene glycol (MIRALAX / GLYCOLAX) packet Take 17 g by mouth daily.    [provider]  potassium chloride (K-DUR,KLOR-CON) 10 MEQ tablet Take 1 tablet  by mouth daily. 04/14/17   [provider]  SUMAtriptan (IMITREX) 100 MG tablet 100 mg every 2 (two) hours as needed.  12/30/14   [provider]  traZODone (DESYREL) 50 MG tablet Take 50 mg by mouth at bedtime.    [provider]  trolamine salicylate (ASPERCREME) 10 % cream Apply 1 application topically as needed for muscle pain.    [provider]    Allergies Penicillins; Aspirin; Ferrous gluconate; Iron polysaccharide; Nsaids; Other; Sulfamethoxazole-trimethoprim; Tolmetin; and Venofer [iron sucrose]  Family History  Problem Relation Age of Onset  . Breast cancer Mother   . Lung cancer Father     Social History Social History   Tobacco Use  . Smoking status: Former Smoker    Types: Cigarettes    Last attempt to quit: 11/29/1993    Years since quitting: 23.8  . Smokeless tobacco: Never Used  Substance Use Topics  . Alcohol use: No  Alcohol/week: 0.6 oz    Types: 1 Standard drinks or equivalent per week  . Drug use: No    Review of Systems  EM caveat  ____________________________________________   PHYSICAL EXAM:  VITAL SIGNS: ED Triage Vitals  Enc Vitals Group     BP 09/19/17 1022 (!) 111/57     Pulse Rate 09/19/17 1022 93     Resp 09/19/17 1022 18     Temp 09/19/17 1022 97.9 F (36.6 C)     Temp Source 09/19/17 1022 Oral     SpO2 09/19/17 1022 93 %     Weight 09/19/17 1026 110 lb (49.9 kg)     Height 09/19/17 1026 5\' 2"  (1.575 m)     Head Circumference --      Peak Flow --      Pain Score 09/19/17 1025 5     Pain Loc --      Pain Edu? --      Excl. in GC? --     Constitutional: Alert and oriented to self, but not to place date or reason for evaluation. Well appearing and in no acute distress.  Very pleasant with no evidence of psychomotor agitation denoted. Eyes: Conjunctivae are normal. Head: Atraumatic. Nose: No congestion/rhinnorhea. Mouth/Throat: Mucous membranes are modestly dry. Neck: No stridor.  Examined  back with nurses, there is no bruising or injury to noted.  No focal tenderness along the cervical thoracic or lumbar spine. Cardiovascular: Normal rate, regular rhythm. Grossly normal heart sounds.  Good peripheral circulation. Respiratory: Normal respiratory effort.  No retractions. Lungs CTAB. Gastrointestinal: Soft and nontender. No distention. Musculoskeletal:   RIGHT Right upper extremity demonstrates normal strength, good use of all muscles. No edema bruising or contusions of the right shoulder/upper arm, right elbow, right forearm / hand. Full range of motion of the right right upper extremity without pain. No evidence of trauma. Strong radial pulse. Intact median/ulnar/radial neuro-muscular exam.  LEFT Left upper extremity demonstrates normal strength, good use of all muscles. No edema bruising or contusions of the left shoulder/upper arm, left elbow, left forearm / hand. Full range of motion of the left  upper extremity without pain. No evidence of trauma. Strong radial pulse. Intact median/ulnar/radial neuro-muscular exam.  Lower Extremities  No edema. Normal DP/PT pulses bilateral with good cap refill.  Normal neuro-motor function lower extremities bilateral.  RIGHT Right lower extremity demonstrates mildly reduced 4 out of 5 strength, good use of all muscles. No edema bruising or contusions of the right hip, right knee, right ankle. Full range of motion of the right lower extremity without pain. No pain on axial loading. No evidence of trauma except for a very small abrasion noted over the right first toe without bleeding.  LEFT Left lower extremity demonstrates mildly reduced 4 out of 5  strength, good use of all muscles. No edema bruising or contusions of the hip,  knee, ankle. Full range of motion of the left lower extremity without pain. No pain on axial loading. No evidence of trauma.   Neurologic:  Normal speech and language. No gross focal neurologic deficits are  appreciated.  Skin:  Skin is warm, dry and intact. No rash noted. Psychiatric: Mood and affect are normal. Speech and behavior are normal.  ____________________________________________   LABS (all labs ordered are listed, but only abnormal results are displayed)  Labs Reviewed  BASIC METABOLIC PANEL - Abnormal; Notable for the following components:      Result Value   Potassium 3.4 (*)  Chloride 98 (*)    Glucose, Bld 117 (*)    BUN 21 (*)    All other components within normal limits  CBC - Abnormal; Notable for the following components:   WBC 11.1 (*)    RDW 16.0 (*)    All other components within normal limits  URINALYSIS, COMPLETE (UACMP) WITH MICROSCOPIC - Abnormal; Notable for the following components:   Color, Urine AMBER (*)    APPearance HAZY (*)    Hgb urine dipstick SMALL (*)    Ketones, ur 20 (*)    Protein, ur 100 (*)    All other components within normal limits  CBG MONITORING, ED   ____________________________________________  EKG  Reviewed enterotomy at 2025 Heart rate 95 QRS 159 QTC 490 Normal sinus rhythm, occasional PAC, some baseline artifact, no evidence of ischemia is noted ____________________________________________  RADIOLOGY    Chest x-ray reviewed, no acute findings except for a slight increase in the right hemidiaphragm which is nonspecific.  CT of the head reviewed, no acute findings. ____________________________________________   PROCEDURES  Procedure(s) performed: None  Procedures  Critical Care performed: No  ____________________________________________   INITIAL IMPRESSION / ASSESSMENT AND PLAN / ED COURSE  Pertinent labs & imaging results that were available during my care of the patient were reviewed by me and considered in my medical decision making (see chart for details).  Patient returns for evaluation after a fall, evidently rolled out of bed or fell out of bed.  No obvious injury to noted, but given the  unwitnessed nature and the patient's history of cognitive problem and poor memory, will obtain a CT of the head to exclude intracranial hemorrhage, also basic labs and urinalysis.  Overall the patient does appear nontoxic.  No acute cardiac or pulmonary symptoms stable hemodynamics  Clinical Course as of Sep 19 1340  Fri Sep 19, 2017  1248 Patient upright, conversant and fully alert no distress.  Husband is at the bedside.  Patient does appear well at present, with no complaint.  Discussed with the patient's husband and she resides in assisted living, they do have some support from [facility] staff but we will make sure that she is able to safely ambulate with a walker prior to plan for discharge.  Her workup is negative for acute cause, but I suspect likely was somewhat dehydrated.  She appears improved, resting comfortably.  Husband at bedside is agreeable with plan  [MQ]    Clinical Course User Index [MQ] Sharyn CreamerQuale, Mark, MD  ----------------------------------------- 1:42 PM on 09/19/2017 -----------------------------------------  Elnita Maxwellheryl, case manager, able to reach patient's living facility, they will be able to arrange for additional assistive devices for her and ongoing assistance.  Seems reasonable, discussed with the husband, husband in agreement with taking her back comfortable with plan to return to living facility.  Will follow closely with primary care doctor.  Return precautions and treatment recommendations and follow-up discussed with the patient who is agreeable with the plan.    ____________________________________________   FINAL CLINICAL IMPRESSION(S) / ED DIAGNOSES  Final diagnoses:  Fall, initial encounter  Mild dehydration      NEW MEDICATIONS STARTED DURING THIS VISIT:  New Prescriptions   No medications on file     Note:  This document was prepared using Dragon voice recognition software and may include unintentional dictation errors.     Sharyn CreamerQuale, Mark,  MD 09/19/17 1343

## 2017-09-19 NOTE — Care Management Note (Signed)
Case Management Note  Patient Details  Name: Ashley Young MRN: [id number] Date of Birth: 03/09/1941  Subjective/Objective:    Approached by MD to assist with a bed rail type device for the patient at the facility. I called and spoke to a tech who knows the patient and was willing to have the co-ordinator start the process of getting a "Halo" for the patient. They will call the PCP directly and get it ordered ASAP. I then spoke to the husband at bedside who found out the process as described and is happy to have this done. No other needs at this time. MD and RN for the patient made aware.              Action/Plan:   Expected Discharge Date:                  Expected Discharge Plan:     In-House Referral:     Discharge planning Services     Post Acute Care Choice:    Choice offered to:     DME Arranged:    DME Agency:     HH Arranged:    HH Agency:     Status of Service:     If discussed at [employer] Length of Stay Meetings, dates discussed:    Additional Comments:  Ashley BueCheryl Dorwin Fitzhenry, RN 09/19/2017, 1:43 PM

## 2017-09-19 NOTE — ED Notes (Signed)
Walked pt with walker to the bathroom, slow steady gait. Dr. Fanny BienQuale aware

## 2017-09-19 NOTE — ED Triage Notes (Signed)
Pt to ED via EMS from [facility]. Pts husband spent the evening with her, went home to get a shower came back to the facility and found her on the floor between the wall and bed. She is complaining of back pain. Pt had a very dry mouth reports that she hasnt been eating or drinking much lately.

## 2017-09-19 NOTE — ED Notes (Signed)
Attempted to call report to [facility]; phone kept ringing. Unsuccessful to give report. Discharge instructions and patient information included in packet and sent with EMS.

## 2017-10-20 ENCOUNTER — Emergency Department
Admission: EM | Admit: 2017-10-20 | Discharge: 2017-10-20 | Disposition: A | Discharge status: Home / Self Care | Payer: Medicare Other | Attending: Emergency Medicine | Admitting: Emergency Medicine

## 2017-10-20 ENCOUNTER — Emergency Department: Payer: Medicare Other

## 2017-10-20 ENCOUNTER — Encounter: Payer: Self-pay | Admitting: Emergency Medicine

## 2017-10-20 DIAGNOSIS — Z87891 Personal history of nicotine dependence: Secondary | ICD-10-CM | POA: Diagnosis not present

## 2017-10-20 DIAGNOSIS — I5022 Chronic systolic (congestive) heart failure: Secondary | ICD-10-CM | POA: Diagnosis not present

## 2017-10-20 DIAGNOSIS — F0390 Unspecified dementia without behavioral disturbance: Secondary | ICD-10-CM | POA: Insufficient documentation

## 2017-10-20 DIAGNOSIS — R000 Tachycardia, unspecified: Secondary | ICD-10-CM | POA: Diagnosis present

## 2017-10-20 DIAGNOSIS — Z79899 Other long term (current) drug therapy: Secondary | ICD-10-CM | POA: Diagnosis not present

## 2017-10-20 HISTORY — DX: Unspecified dementia, unspecified severity, without behavioral disturbance, psychotic disturbance, mood disturbance, and anxiety: F03.90

## 2017-10-20 LAB — URINALYSIS, ROUTINE W REFLEX MICROSCOPIC
Bilirubin Urine: NEGATIVE
GLUCOSE, UA: NEGATIVE mg/dL
Hgb urine dipstick: NEGATIVE
Ketones, ur: NEGATIVE mg/dL
Nitrite: NEGATIVE
PROTEIN: NEGATIVE mg/dL
Specific Gravity, Urine: 1.024 (ref 1.005–1.030)
pH: 6 (ref 5.0–8.0)

## 2017-10-20 LAB — COMPREHENSIVE METABOLIC PANEL
ALK PHOS: 153 U/L — AB (ref 38–126)
ALT: 34 U/L (ref 14–54)
AST: 39 U/L (ref 15–41)
Albumin: 3.3 g/dL — ABNORMAL LOW (ref 3.5–5.0)
Anion gap: 10 (ref 5–15)
BUN: 23 mg/dL — AB (ref 6–20)
CALCIUM: 8.4 mg/dL — AB (ref 8.9–10.3)
CO2: 24 mmol/L (ref 22–32)
Chloride: 104 mmol/L (ref 101–111)
Creatinine, Ser: 0.73 mg/dL (ref 0.44–1.00)
Glucose, Bld: 86 mg/dL (ref 65–99)
Potassium: 4.6 mmol/L (ref 3.5–5.1)
Sodium: 138 mmol/L (ref 135–145)
Total Bilirubin: 0.3 mg/dL (ref 0.3–1.2)
Total Protein: 6.8 g/dL (ref 6.5–8.1)

## 2017-10-20 LAB — CBC
HCT: 39.8 % (ref 35.0–47.0)
Hemoglobin: 12.9 g/dL (ref 12.0–16.0)
MCH: 29.8 pg (ref 26.0–34.0)
MCHC: 32.5 g/dL (ref 32.0–36.0)
MCV: 91.6 fL (ref 80.0–100.0)
Platelets: 304 10*3/uL (ref 150–440)
RBC: 4.35 MIL/uL (ref 3.80–5.20)
RDW: 17.8 % — AB (ref 11.5–14.5)
WBC: 10.2 10*3/uL (ref 3.6–11.0)

## 2017-10-20 LAB — TROPONIN I: Troponin I: <0.03 ng/mL (ref <0.03)

## 2017-10-20 LAB — MAGNESIUM: Magnesium: 1.8 mg/dL (ref 1.7–2.4)

## 2017-10-20 MED ORDER — SODIUM CHLORIDE 0.9 % IV BOLUS
500.0000 mL | INTRAVENOUS | Status: AC
  Administered 2017-10-20: 500 mL via INTRAVENOUS

## 2017-10-20 MED ORDER — FOSFOMYCIN TROMETHAMINE 3 G PO PACK
3.0000 g | PACK | Freq: Once | ORAL | Status: AC
  Administered 2017-10-20: 3 g via ORAL
  Filled 2017-10-20: qty 3

## 2017-10-20 NOTE — Discharge Instructions (Signed)
As we discussed, your workup today was reassuring.

## 2017-10-20 NOTE — ED Provider Notes (Signed)
[facility] Emergency Department Provider Note  ____________________________________________   First MD Initiated Contact with Patient 10/20/17 1551     (approximate)  I have reviewed the triage vital signs and the nursing notes.   HISTORY  Chief Complaint Tachycardia  Level 5 caveat:  history/ROS may be limited by a history of chronic dementia  HPI Ashley Young is a 77 y.o. female with medical history as listed below who but who is still quite functional and generally healthy for her age.  She presents by EMS from [facility] For evaluation of "fast heart rate".  She does not remember why they checked her heart rate but she said that she has had no symptoms.  She specifically denies recent fever/chills, chest pain, shortness of breath, cough, nausea, vomiting, abdominal pain.  Her husband arrived during the time I was interviewing her and he was able to provide a little bit more history.  He states that they simply told them at [facility] that her heart rate was fast and that she needed to go to the emergency department but he confirmed that she has not been having any new symptoms or complaints recently.  She was in the emergency department about a month ago after a fall but otherwise has been in her usual state of health.  It is not able to determine whether the onset of her tachycardia was acute or chronic given that the patient and her husband were unaware of it until they were told that her heart rate was fast.  She also cannot describe the severity as she is asymptomatic.  Nothing in particular seems to make it a better or worse but her heart rate is right around 100-105 in the emergency department.  Past Medical History:  Diagnosis Date  . Arthritis   . B12 deficiency   . CHF (congestive heart failure) (HCC)   . Chronic systolic heart failure (HCC)   . Dementia   . Depression   . Dysrhythmia    TACHYCARDIA  . Failure to thrive (0-17)   . GERD  (gastroesophageal reflux disease)   . Glaucoma   . Headache    MIGRAINES  . History of kidney stones   . Hypercholesterolemia   . IDA (iron deficiency anemia)   . Migraine   . Mood disorder (HCC)   . Multiple gastric ulcers   . Osteoporosis   . Renal disorder    Multiple episodes of kidney stones.     Patient Active Problem List   Diagnosis Date Noted  . Mood disorder (HCC)   . DNR (do not resuscitate) 12/30/2016  . Hip fracture, left (HCC)   . Palliative care encounter   . Hypokalemia 12/27/2016  . Severe recurrent major depression without psychotic features (HCC) 12/24/2016  . Failure to thrive (0-17) 12/24/2016  . Chronic systolic heart failure (HCC) 01/12/2016  . Borderline diabetes mellitus 01/02/2016  . Pure hypercholesterolemia 01/02/2016  . Goals of care, counseling/discussion 01/02/2016  . B12 deficiency 04/26/2015  . Iron deficiency anemia due to chronic blood loss 04/26/2015  . History of anemia 03/28/2015  . 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 DDD (degenerative disc disease), lumbar 03/09/2014  . Degeneration of intervertebral disc of lumbar region 03/09/2014  . Avitaminosis D 02/09/2014  . Anxiety state 01/04/2014  . Clinical depression 01/04/2014  . Bilateral lower extremity edema 01/04/2014  . Recurrent major depression in remission (HCC) 01/04/2014  . Abdominal pain 11/19/2013  . Acquired gastric outlet stenosis 24-Aug-2013  . Adverse effect  of salicylate 08/23/2013  . Anxiety and depression 08/19/2013  . Esophagitis 08/01/2013  . H/O gastrointestinal hemorrhage 08/22/2013  . H/O gastric ulcer 08/26/2013  . Disorder of nutrition 08/03/2013  . Headache, migraine 08/19/2013  . Calculus of kidney 08/27/2013  . OP (osteoporosis) 08/16/2013    Past Surgical History:  Procedure Laterality Date  . ABDOMINAL SURGERY     Reconstructed stomach and 3 different surgeries.   . APPENDECTOMY    . BREAST SURGERY     cyst removal  . CATARACT EXTRACTION  W/PHACO Right 06/05/2015   Procedure: CATARACT EXTRACTION PHACO AND INTRAOCULAR LENS PLACEMENT (IOC);  Surgeon: Lockie Molahadwick Brasington, MD;  Location: [facility] ORS;  Service: Ophthalmology;  Laterality: Right;  US 01:06   . CESAREAN SECTION     x 3  . CHOLECYSTECTOMY    . COLONOSCOPY    . ESOPHAGOGASTRODUODENOSCOPY    . INTRAMEDULLARY (IM) NAIL INTERTROCHANTERIC Left 12/29/2016   Procedure: INTRAMEDULLARY (IM) NAIL INTERTROCHANTRIC;  Surgeon: Kennedy BuckerMenz, Michael, MD;  Location: [facility] ORS;  Service: Orthopedics;  Laterality: Left;  . KYPHOPLASTY    . KYPHOPLASTY    . LITHOTRIPSY     and stone extraction    Prior to Admission medications   Medication Sig Start Date End Date Taking? Authorizing Provider  acetaminophen (TYLENOL) 325 MG tablet Take 2 tablets (650 mg total) by mouth every 6 (six) hours as needed for mild pain (or Fever >/= 101). 12/31/16  Yes Auburn BilberryPatel, Shreyang, MD  ARIPiprazole (ABILIFY) 10 MG tablet Take 10 mg by mouth daily.   Yes [provider]  calcium-vitamin D (OSCAL WITH D) 500-200 MG-UNIT tablet Take 1 tablet by mouth daily with breakfast.   Yes [provider]  FLUoxetine (PROZAC) 10 MG capsule Take 3 capsules (30 mg total) by mouth daily. 01/01/17  Yes Auburn BilberryPatel, Shreyang, MD  furosemide (LASIX) 20 MG tablet Take 2 tablets by mouth daily. 03/02/17  Yes [provider]  gabapentin (NEURONTIN) 100 MG capsule Take 100 mg by mouth at bedtime.   Yes [provider]  latanoprost (XALATAN) 0.005 % ophthalmic solution Place 1 drop into both eyes at bedtime.  11/18/14  Yes [provider]  loperamide (IMODIUM A-D) 2 MG tablet Take 2 mg by mouth 3 (three) times daily as needed for diarrhea or loose stools.   Yes [provider]  mirtazapine (REMERON SOL-TAB) 15 MG disintegrating tablet Take 0.5 tablets (7.5 mg total) by mouth at bedtime. 12/31/16  Yes Auburn BilberryPatel, Shreyang, MD  Multiple Vitamin (MULTIVITAMIN) tablet Take 1 tablet by mouth daily.   Yes [provider]  ondansetron (ZOFRAN-ODT) 4 MG disintegrating tablet 4 mg every 8 (eight) hours as needed.  10/06/14  Yes [provider]  Ethelda Chickyster Shell (OYSTER CALCIUM) 500 MG TABS tablet Take 500 mg of elemental calcium by mouth daily.   Yes [provider]  pantoprazole (PROTONIX) 40 MG tablet Take 40 mg by mouth daily.  09/08/14  Yes [provider]  polyethylene glycol (MIRALAX / GLYCOLAX) packet Take 17 g by mouth daily as needed.    Yes [provider]  potassium chloride (K-DUR,KLOR-CON) 10 MEQ tablet Take 1 tablet by mouth daily. 04/14/17  Yes [provider]  SUMAtriptan (IMITREX) 100 MG tablet 100 mg every 2 (two) hours as needed.  12/30/14  Yes [provider]  traZODone (DESYREL) 50 MG tablet Take 25 mg by mouth at bedtime.    Yes [provider]  ARIPiprazole (ABILIFY) 2 MG tablet Take 1 tablet (2  mg total) by mouth daily. Patient not taking: Reported on 10/20/2017 01/01/17   Auburn Bilberry, MD    Allergies Penicillins; Aspirin; Ferrous gluconate; Iron polysaccharide; Nsaids; Other; Sulfamethoxazole-trimethoprim; Tolmetin; and Venofer [iron sucrose]  Family History  Problem Relation Age of Onset  . Breast cancer Mother   . Lung cancer Father     Social History Social History   Tobacco Use  . Smoking status: Former Smoker    Types: Cigarettes    Last attempt to quit: 11/29/1993    Years since quitting: 23.9  . Smokeless tobacco: Never Used  Substance Use Topics  . Alcohol use: No    Alcohol/week: 0.6 oz    Types: 1 Standard drinks or equivalent per week  . Drug use: No    Review of Systems Constitutional: No fever/chills Eyes: No visual changes. ENT: No sore throat. Cardiovascular: Denies chest pain.  Reportedly having tachycardia greater than 120 at her living facility Respiratory: Denies shortness of breath. Gastrointestinal: No abdominal pain.  No nausea, no vomiting.  No diarrhea.  No  constipation. Genitourinary: Negative for dysuria. Musculoskeletal: Negative for neck pain.  Negative for back pain. Integumentary: Negative for rash. Neurological: Negative for headaches, focal weakness or numbness.   ____________________________________________   PHYSICAL EXAM:  VITAL SIGNS: ED Triage Vitals  Enc Vitals Group     BP 10/20/17 1545 113/60     Pulse Rate 10/20/17 1545 (!) 105     Resp 10/20/17 1545 20     Temp 10/20/17 1545 98 F (36.7 C)     Temp Source 10/20/17 1545 Oral     SpO2 10/20/17 1545 93 %     Weight 10/20/17 1544 49.9 kg (110 lb)     Height 10/20/17 1544 1.6 m (5\' 3" )     Head Circumference --      Peak Flow --      Pain Score 10/20/17 1544 0     Pain Loc --      Pain Edu? --      Excl. in GC? --     Constitutional: Alert and oriented. Well appearing and in no acute distress. Eyes: Conjunctivae are normal.  Head: Atraumatic. Nose: No congestion/rhinnorhea. Mouth/Throat: Mucous membranes are moist. Neck: No stridor.  No meningeal signs.   Cardiovascular: Borderline tachycardia with a rate of right around 105, regular rhythm. Good peripheral circulation. Grossly normal heart sounds. Respiratory: Normal respiratory effort.  No retractions. Lungs CTAB. Gastrointestinal: Soft and nontender. No distention.  Musculoskeletal: No lower extremity tenderness nor edema. No gross deformities of extremities. Neurologic:  Normal speech and language. No gross focal neurologic deficits are appreciated.  Skin:  Skin is warm, dry and intact. No rash noted.   ____________________________________________   LABS (all labs ordered are listed, but only abnormal results are displayed)  Labs Reviewed  CBC - Abnormal; Notable for the following components:      Result Value   RDW 17.8 (*)    All other components within normal limits  COMPREHENSIVE METABOLIC PANEL - Abnormal; Notable for the following components:   BUN 23 (*)    Calcium 8.4 (*)    Albumin 3.3  (*)    Alkaline Phosphatase 153 (*)    All other components within normal limits  URINALYSIS, ROUTINE W REFLEX MICROSCOPIC - Abnormal; Notable for the following components:   Color, Urine YELLOW (*)    APPearance CLEAR (*)    Leukocytes, UA MODERATE (*)    Bacteria, UA RARE (*)  Squamous Epithelial / LPF 0-5 (*)    All other components within normal limits  URINE CULTURE  TROPONIN I  MAGNESIUM   ____________________________________________  EKG  ED ECG REPORT I, Loleta Rose, the attending physician, personally viewed and interpreted this ECG.  Date: 10/20/2017 EKG Time: 15:46 Rate: 103 Rhythm: sinus tachycardia QRS Axis: normal Intervals: normal ST/T Wave abnormalities: Non-specific ST segment / T-wave changes, but no evidence of acute ischemia. Narrative Interpretation: no evidence of acute ischemia   ____________________________________________  RADIOLOGY   ED MD interpretation:  No acute infiltrates  Official radiology report(s): Dg Chest Portable 1 View  Result Date: 10/20/2017 CLINICAL DATA:  Tachycardia and borderline hypoxemia EXAM: PORTABLE CHEST 1 VIEW COMPARISON:  09/19/2017 FINDINGS: Cardiac silhouette is normal in size. No mediastinal or hilar masses. No evidence of adenopathy. Mild increased lung markings in the bases. Mild scarring noted at the lung apices. Lungs are otherwise clear. No pleural effusion or pneumothorax. Skeletal structures are demineralized but grossly intact. IMPRESSION: No active disease. Electronically Signed   By: Amie Portland M.D.   On: 10/20/2017 16:44    ____________________________________________   PROCEDURES  Critical Care performed: No   Procedure(s) performed:   Procedures   ____________________________________________   INITIAL IMPRESSION / ASSESSMENT AND PLAN / ED COURSE  As part of my medical decision making, I reviewed the following data within the electronic MEDICAL RECORD NUMBEROld chart reviewed, prior ED  notes reviewed, labs reviewed, radiographs reviewed, EKG reviewed, discussed plan of care with family.  Differential diagnosis includes, but is not limited to, infection including but not limited to urinary tract infection or pneumonia or viral illness, paroxysmal SVT, A. fib with RVR, other nonspecific cardiac arrhythmia, volume depletion, electrolyte abnormality, renal dysfunction, etc.  The patient is very well-appearing and in no acute distress at this time.  Her heart rate reportedly was greater than 120 at the facility but it is now down to around 105 and she has been asymptomatic throughout.  I discussed the plan of care with the patient and her husband and they agreed with my plan to evaluate her broadly with labs, chest x-ray, and urinalysis, and if no acute or emergent condition is found, she should be appropriate for discharge back to her facility.  I am addressing her tachycardia with a small fluid bolus as volume depletion is at this point what I believe to be the most probable cause of her mild tachycardia.  Clinical Course as of Oct 21 1930  Mon Oct 20, 2017  1659 The patient's chest x-ray is reassuring with no sign of acute infiltrate.  Lab work is still mostly pending although her CBC is reassuring with no anemia and no leukocytosis.   [CF]  1927 No acute abnormalities on metabolic panel.  Her BUN is very slightly elevated at 23 but this is nonspecific and not indicative of severe dehydration.  I also did address it with a 500 mL normal saline bolus.  She is getting fosfomycin and then will be discharged.  Her lab work is otherwise normal with a negative troponin and a normal CBC.  I discussed all the results with the patient and her husband and they understand and agree with the plan for discharge.  Her husband also states that her heart rate always runs "a little bit high".   [CF]    Clinical Course User Index [CF] Loleta Rose, MD     ____________________________________________  FINAL CLINICAL IMPRESSION(S) / ED DIAGNOSES  Final diagnoses:  Tachycardia  MEDICATIONS GIVEN DURING THIS VISIT:  Medications  sodium chloride 0.9 % bolus 500 mL (500 mLs Intravenous Given 10/20/17 1607)  fosfomycin (MONUROL) packet 3 g (3 g Oral Given 10/20/17 1855)     ED Discharge Orders    None       Note:  This document was prepared using Dragon voice recognition software and may include unintentional dictation errors.    Loleta Rose, MD 10/20/17 [phone]

## 2017-10-20 NOTE — ED Triage Notes (Signed)
Pt arrived via ems from [facility]. Facility called ems due to "fast heart rate," reporting a rate of 120+. Pt denies any pain at this time. Upon triage patient's heart rate 104.

## 2017-10-22 LAB — URINE CULTURE: Special Requests: NORMAL

## 2017-12-03 ENCOUNTER — Inpatient Hospital Stay: Payer: Medicare Other | Attending: Internal Medicine

## 2017-12-03 DIAGNOSIS — E538 Deficiency of other specified B group vitamins: Secondary | ICD-10-CM | POA: Diagnosis present

## 2017-12-03 DIAGNOSIS — D509 Iron deficiency anemia, unspecified: Secondary | ICD-10-CM

## 2017-12-03 MED ORDER — CYANOCOBALAMIN 1000 MCG/ML IJ SOLN
1000.0000 ug | Freq: Once | INTRAMUSCULAR | Status: AC
  Administered 2017-12-03: 1000 ug via INTRAMUSCULAR

## 2018-03-04 ENCOUNTER — Inpatient Hospital Stay: Payer: Medicare Other | Attending: Internal Medicine

## 2018-03-04 ENCOUNTER — Inpatient Hospital Stay: Payer: Medicare Other

## 2018-03-04 ENCOUNTER — Inpatient Hospital Stay (HOSPITAL_BASED_OUTPATIENT_CLINIC_OR_DEPARTMENT_OTHER): Payer: Medicare Other | Admitting: Internal Medicine

## 2018-03-04 ENCOUNTER — Other Ambulatory Visit: Payer: Self-pay

## 2018-03-04 DIAGNOSIS — D509 Iron deficiency anemia, unspecified: Secondary | ICD-10-CM

## 2018-03-04 DIAGNOSIS — E539 Vitamin B deficiency, unspecified: Secondary | ICD-10-CM | POA: Diagnosis not present

## 2018-03-04 DIAGNOSIS — Z87891 Personal history of nicotine dependence: Secondary | ICD-10-CM

## 2018-03-04 DIAGNOSIS — E538 Deficiency of other specified B group vitamins: Secondary | ICD-10-CM | POA: Diagnosis present

## 2018-03-04 DIAGNOSIS — Z79899 Other long term (current) drug therapy: Secondary | ICD-10-CM | POA: Diagnosis not present

## 2018-03-04 DIAGNOSIS — D500 Iron deficiency anemia secondary to blood loss (chronic): Secondary | ICD-10-CM | POA: Diagnosis present

## 2018-03-04 LAB — COMPREHENSIVE METABOLIC PANEL
ALK PHOS: 160 U/L — AB (ref 38–126)
ALT: 38 U/L (ref 0–44)
AST: 41 U/L (ref 15–41)
Albumin: 3.8 g/dL (ref 3.5–5.0)
Anion gap: 11 (ref 5–15)
BUN: 21 mg/dL (ref 8–23)
CALCIUM: 9.2 mg/dL (ref 8.9–10.3)
CO2: 24 mmol/L (ref 22–32)
CREATININE: 0.75 mg/dL (ref 0.44–1.00)
Chloride: 101 mmol/L (ref 98–111)
GFR calc non Af Amer: >60 mL/min (ref >60)
Glucose, Bld: 112 mg/dL — ABNORMAL HIGH (ref 70–99)
Potassium: 4.3 mmol/L (ref 3.5–5.1)
SODIUM: 136 mmol/L (ref 135–145)
Total Bilirubin: 0.3 mg/dL (ref 0.3–1.2)
Total Protein: 7.4 g/dL (ref 6.5–8.1)

## 2018-03-04 LAB — CBC WITH DIFFERENTIAL/PLATELET
BASOS PCT: 1 %
Basophils Absolute: 0.1 10*3/uL (ref 0–0.1)
EOS ABS: 0.3 10*3/uL (ref 0–0.7)
Eosinophils Relative: 3 %
HEMATOCRIT: 41.7 % (ref 35.0–47.0)
HEMOGLOBIN: 13.7 g/dL (ref 12.0–16.0)
LYMPHS ABS: 1.7 10*3/uL (ref 1.0–3.6)
Lymphocytes Relative: 16 %
MCH: 29.5 pg (ref 26.0–34.0)
MCHC: 32.8 g/dL (ref 32.0–36.0)
MCV: 89.9 fL (ref 80.0–100.0)
MONO ABS: 0.8 10*3/uL (ref 0.2–0.9)
Monocytes Relative: 7 %
NEUTROS PCT: 73 %
Neutro Abs: 7.9 10*3/uL — ABNORMAL HIGH (ref 1.4–6.5)
Platelets: 315 10*3/uL (ref 150–440)
RBC: 4.64 MIL/uL (ref 3.80–5.20)
RDW: 13.6 % (ref 11.5–14.5)
WBC: 10.8 10*3/uL (ref 3.6–11.0)

## 2018-03-04 LAB — IRON AND TIBC
Iron: 49 ug/dL (ref 28–170)
Saturation Ratios: 13 % (ref 10.4–31.8)
TIBC: 372 ug/dL (ref 250–450)
UIBC: 323 ug/dL

## 2018-03-04 LAB — FERRITIN: FERRITIN: 96 ng/mL (ref 11–307)

## 2018-03-04 MED ORDER — CYANOCOBALAMIN 1000 MCG/ML IJ SOLN
1000.0000 ug | Freq: Once | INTRAMUSCULAR | Status: AC
  Administered 2018-03-04: 1000 ug via INTRAMUSCULAR
  Filled 2018-03-04: qty 1

## 2018-03-04 NOTE — Progress Notes (Signed)
[facility] OFFICE PROGRESS NOTE  Patient Care Team: Marisue IvanLinthavong, Kanhka, MD as PCP - General (Family Medicine)   SUMMARY OF ONCOLOGIC HISTORY:  # ANEMIA- Multifactorial- Iron def/ B12 def [previous stomach surgeries/PUD]- IV iron last October 2016/  # B12  IM injections q 2 MONTHS [[facility]]  INTERVAL HISTORY:  A pleasant 77 year old female patient with above history of anemia likely secondary to malabsorption/question chronic GI blood loss- currently on IV iron/B12 shot is here for follow-up.    Denies any fatigue.  Denies any nausea vomiting abdominal pain blood in stools or black or stools.  REVIEW OF SYSTEMS:  A complete 10 point review of system is done which is negative except mentioned above/history of present illness.   PAST MEDICAL HISTORY :  Past Medical History:  Diagnosis Date  . Arthritis   . B12 deficiency   . CHF (congestive heart failure) (HCC)   . Chronic systolic heart failure (HCC)   . Dementia   . Depression   . Dysrhythmia    TACHYCARDIA  . Failure to thrive (0-17)   . GERD (gastroesophageal reflux disease)   . Glaucoma   . Headache    MIGRAINES  . History of kidney stones   . Hypercholesterolemia   . IDA (iron deficiency anemia)   . Migraine   . Mood disorder (HCC)   . Multiple gastric ulcers   . Osteoporosis   . Renal disorder    Multiple episodes of kidney stones.     PAST SURGICAL HISTORY :   Past Surgical History:  Procedure Laterality Date  . ABDOMINAL SURGERY     Reconstructed stomach and 3 different surgeries.   . APPENDECTOMY    . BREAST SURGERY     cyst removal  . CATARACT EXTRACTION W/PHACO Right 06/05/2015   Procedure: CATARACT EXTRACTION PHACO AND INTRAOCULAR LENS PLACEMENT (IOC);  Surgeon: Lockie Molahadwick Brasington, MD;  Location: [facility] ORS;  Service: Ophthalmology;  Laterality: Right;  US 01:06   . CESAREAN SECTION     x 3  . CHOLECYSTECTOMY    . COLONOSCOPY    . ESOPHAGOGASTRODUODENOSCOPY    . INTRAMEDULLARY (IM)  NAIL INTERTROCHANTERIC Left 12/29/2016   Procedure: INTRAMEDULLARY (IM) NAIL INTERTROCHANTRIC;  Surgeon: Kennedy BuckerMenz, Michael, MD;  Location: [facility] ORS;  Service: Orthopedics;  Laterality: Left;  . KYPHOPLASTY    . KYPHOPLASTY    . LITHOTRIPSY     and stone extraction    FAMILY HISTORY :   Family History  Problem Relation Age of Onset  . Breast cancer Mother   . Lung cancer Father     SOCIAL HISTORY:   Social History   Tobacco Use  . Smoking status: Former Smoker    Types: Cigarettes    Last attempt to quit: 11/29/1993    Years since quitting: 24.2  . Smokeless tobacco: Never Used  Substance Use Topics  . Alcohol use: No    Alcohol/week: 1.0 standard drinks    Types: 1 Standard drinks or equivalent per week  . Drug use: No    ALLERGIES:  is allergic to penicillins; aspirin; ferrous gluconate; iron polysaccharide; nsaids; other; sulfamethoxazole-trimethoprim; tolmetin; and venofer [iron sucrose].  MEDICATIONS:  Current Outpatient Medications  Medication Sig Dispense Refill  . acetaminophen (TYLENOL) 325 MG tablet Take 2 tablets (650 mg total) by mouth every 6 (six) hours as needed for mild pain (or Fever >/= 101).    . ARIPiprazole (ABILIFY) 10 MG tablet Take 10 mg by mouth daily.    Marland Kitchen. FLUoxetine (  PROZAC) 10 MG capsule Take 3 capsules (30 mg total) by mouth daily.  3  . furosemide (LASIX) 20 MG tablet Take 2 tablets by mouth daily.    Marland Kitchen gabapentin (NEURONTIN) 100 MG capsule Take 100 mg by mouth at bedtime.    Marland Kitchen latanoprost (XALATAN) 0.005 % ophthalmic solution Place 1 drop into both eyes at bedtime.     . Multiple Vitamin (MULTIVITAMIN) tablet Take 1 tablet by mouth daily.    Ethelda Chick (OYSTER CALCIUM) 500 MG TABS tablet Take 500 mg of elemental calcium by mouth daily.    . pantoprazole (PROTONIX) 40 MG tablet Take 40 mg by mouth daily.     . potassium chloride (K-DUR,KLOR-CON) 10 MEQ tablet Take 1 tablet by mouth daily.    . SUMAtriptan (IMITREX) 100 MG tablet 100 mg every 2  (two) hours as needed.     . calcium-vitamin D (OSCAL WITH D) 500-200 MG-UNIT tablet Take 1 tablet by mouth daily with breakfast.    . loperamide (IMODIUM A-D) 2 MG tablet Take 2 mg by mouth 3 (three) times daily as needed for diarrhea or loose stools.     No current facility-administered medications for this visit.     PHYSICAL EXAMINATION:  BP 111/73 (BP Location: Right Arm, Patient Position: Sitting)   Pulse 99   Temp 97.6 F (36.4 C) (Tympanic)   Resp 18   Ht 5\' 3"  (1.6 m)   Wt 117 lb (53.1 kg)   BMI 20.73 kg/m   Filed Weights   03/04/18 1327  Weight: 117 lb (53.1 kg)    GENERAL: Thin built moderately nourished female patient Alert, no distress and comfortable. Accompanied by her husband. EYES: no pallor or icterus OROPHARYNX: no thrush or ulceration; good dentition  NECK: supple, no masses felt LYMPH:  no palpable lymphadenopathy in the cervical, axillary or inguinal regions LUNGS: clear to auscultation and  No wheeze or crackles HEART/CVS: regular rate & rhythm and no murmurs; no bilateral lower extremity edema ABDOMEN:abdomen soft, non-tender and normal bowel sounds Musculoskeletal:no cyanosis of digits and no clubbing  PSYCH: alert & oriented x 3 with fluent speech NEURO: no focal motor/sensory deficits SKIN:  no rashes or significant lesions  LABORATORY DATA:  I have reviewed the data as listed    Component Value Date/Time   NA 136 03/04/2018 1302   NA 139 11/10/2014 0503   K 4.3 03/04/2018 1302   K 3.6 11/10/2014 0503   CL 101 03/04/2018 1302   CL 107 11/10/2014 0503   CO2 24 03/04/2018 1302   CO2 24 11/10/2014 0503   GLUCOSE 112 (H) 03/04/2018 1302   GLUCOSE 84 11/10/2014 0503   BUN 21 03/04/2018 1302   BUN 7 11/10/2014 0503   CREATININE 0.75 03/04/2018 1302   CREATININE 0.56 11/10/2014 0503   CALCIUM 9.2 03/04/2018 1302   CALCIUM 7.8 (L) 11/10/2014 0503   PROT 7.4 03/04/2018 1302   PROT 6.9 11/09/2014 1254   ALBUMIN 3.8 03/04/2018 1302   ALBUMIN  3.5 11/09/2014 1254   AST 41 03/04/2018 1302   AST 19 11/09/2014 1254   ALT 38 03/04/2018 1302   ALT 15 11/09/2014 1254   ALKPHOS 160 (H) 03/04/2018 1302   ALKPHOS 155 (H) 11/09/2014 1254   BILITOT 0.3 03/04/2018 1302   BILITOT 0.3 11/09/2014 1254   GFRNONAA >60 03/04/2018 1302   GFRNONAA >60 11/10/2014 0503   GFRAA >60 03/04/2018 1302   GFRAA >60 11/10/2014 0503    No results found for:  SPEP, UPEP  Lab Results  Component Value Date   WBC 10.8 03/04/2018   NEUTROABS 7.9 (H) 03/04/2018   HGB 13.7 03/04/2018   HCT 41.7 03/04/2018   MCV 89.9 03/04/2018   PLT 315 03/04/2018      Chemistry      Component Value Date/Time   NA 136 03/04/2018 1302   NA 139 11/10/2014 0503   K 4.3 03/04/2018 1302   K 3.6 11/10/2014 0503   CL 101 03/04/2018 1302   CL 107 11/10/2014 0503   CO2 24 03/04/2018 1302   CO2 24 11/10/2014 0503   BUN 21 03/04/2018 1302   BUN 7 11/10/2014 0503   CREATININE 0.75 03/04/2018 1302   CREATININE 0.56 11/10/2014 0503      Component Value Date/Time   CALCIUM 9.2 03/04/2018 1302   CALCIUM 7.8 (L) 11/10/2014 0503   ALKPHOS 160 (H) 03/04/2018 1302   ALKPHOS 155 (H) 11/09/2014 1254   AST 41 03/04/2018 1302   AST 19 11/09/2014 1254   ALT 38 03/04/2018 1302   ALT 15 11/09/2014 1254   BILITOT 0.3 03/04/2018 1302   BILITOT 0.3 11/09/2014 1254       RADIOGRAPHIC STUDIES: I have personally reviewed the radiological images as listed and agreed with the findings in the report. No results found.   ASSESSMENT & PLAN:  Iron deficiency anemia due to chronic blood loss # Anemia- multifactorial/iron deficiency-B12 deficiency secondary to previous GI surgeries.  Improved.  Today hemoglobin is 13.3 [previously 9-10].  Hold IV iron today.  #  B12 deficiency- continue B12 shots every 3 months; Today.  Stable.  #  follow up in 6 months/b12/cbc-/iron studies ferritin/MD.       Earna Coder, MD 03/08/2018 8:34 PM

## 2018-03-04 NOTE — Assessment & Plan Note (Addendum)
#   Anemia- multifactorial/iron deficiency-B12 deficiency secondary to previous GI surgeries.  Improved.  Today hemoglobin is 13.3 [previously 9-10].  Hold IV iron today.  #  B12 deficiency- continue B12 shots every 3 months; Today.  Stable.  #  follow up in 6 months/b12/cbc-/iron studies ferritin/MD.

## 2018-04-09 ENCOUNTER — Encounter: Payer: Self-pay | Admitting: *Deleted

## 2018-04-09 ENCOUNTER — Other Ambulatory Visit: Payer: Self-pay

## 2018-04-09 NOTE — Discharge Instructions (Signed)

## 2018-04-15 ENCOUNTER — Ambulatory Visit: Payer: Medicare Other | Admitting: Anesthesiology

## 2018-04-15 ENCOUNTER — Ambulatory Visit
Admission: RE | Admit: 2018-04-15 | Discharge: 2018-04-15 | Disposition: A | Discharge status: Home / Self Care | Payer: Medicare Other | Source: Ambulatory Visit | Attending: Ophthalmology | Admitting: Ophthalmology

## 2018-04-15 ENCOUNTER — Encounter
Admission: RE | Disposition: A | Discharge status: Home / Self Care | Payer: Self-pay | Source: Ambulatory Visit | Attending: Ophthalmology

## 2018-04-15 DIAGNOSIS — H2512 Age-related nuclear cataract, left eye: Secondary | ICD-10-CM | POA: Diagnosis present

## 2018-04-15 DIAGNOSIS — K219 Gastro-esophageal reflux disease without esophagitis: Secondary | ICD-10-CM | POA: Diagnosis not present

## 2018-04-15 DIAGNOSIS — Z79899 Other long term (current) drug therapy: Secondary | ICD-10-CM | POA: Insufficient documentation

## 2018-04-15 DIAGNOSIS — D649 Anemia, unspecified: Secondary | ICD-10-CM | POA: Diagnosis not present

## 2018-04-15 DIAGNOSIS — F329 Major depressive disorder, single episode, unspecified: Secondary | ICD-10-CM | POA: Diagnosis not present

## 2018-04-15 DIAGNOSIS — Z87891 Personal history of nicotine dependence: Secondary | ICD-10-CM | POA: Diagnosis not present

## 2018-04-15 DIAGNOSIS — I509 Heart failure, unspecified: Secondary | ICD-10-CM | POA: Insufficient documentation

## 2018-04-15 HISTORY — PX: CATARACT EXTRACTION W/PHACO: SHX586

## 2018-04-15 HISTORY — DX: Presence of dental prosthetic device (complete) (partial): Z97.2

## 2018-04-15 SURGERY — PHACOEMULSIFICATION, CATARACT, WITH IOL INSERTION
Anesthesia: Monitor Anesthesia Care | Laterality: Left

## 2018-04-15 MED ORDER — LIDOCAINE HCL (PF) 2 % IJ SOLN
INTRAOCULAR | Status: DC | PRN
  Administered 2018-04-15: 1 mL via INTRAMUSCULAR

## 2018-04-15 MED ORDER — ONDANSETRON HCL 4 MG/2ML IJ SOLN: 4.0000 mg | Freq: Once | INTRAMUSCULAR | Status: DC | PRN

## 2018-04-15 MED ORDER — CEFUROXIME OPHTHALMIC INJECTION 1 MG/0.1 ML
INJECTION | OPHTHALMIC | Status: DC | PRN
  Administered 2018-04-15: 0.1 mL via INTRACAMERAL

## 2018-04-15 MED ORDER — MIDAZOLAM HCL 2 MG/2ML IJ SOLN
INTRAMUSCULAR | Status: DC | PRN
  Administered 2018-04-15: 1 mg via INTRAVENOUS

## 2018-04-15 MED ORDER — NA HYALUR & NA CHOND-NA HYALUR 0.4-0.35 ML IO KIT
PACK | INTRAOCULAR | Status: DC | PRN
  Administered 2018-04-15: 1 mL via INTRAOCULAR

## 2018-04-15 MED ORDER — ARMC OPHTHALMIC DILATING DROPS
1.0000 "application " | OPHTHALMIC | Status: DC | PRN
  Administered 2018-04-15 (×3): 1 via OPHTHALMIC

## 2018-04-15 MED ORDER — EPINEPHRINE PF 1 MG/ML IJ SOLN
INTRAOCULAR | Status: DC | PRN
  Administered 2018-04-15: 104 mL via OPHTHALMIC

## 2018-04-15 MED ORDER — BRIMONIDINE TARTRATE-TIMOLOL 0.2-0.5 % OP SOLN
OPHTHALMIC | Status: DC | PRN
  Administered 2018-04-15: 1 [drp] via OPHTHALMIC

## 2018-04-15 MED ORDER — FENTANYL CITRATE (PF) 100 MCG/2ML IJ SOLN
INTRAMUSCULAR | Status: DC | PRN
  Administered 2018-04-15 (×2): 25 ug via INTRAVENOUS

## 2018-04-15 MED ORDER — LACTATED RINGERS IV SOLN: 10.0000 mL/h | INTRAVENOUS | Status: DC

## 2018-04-15 MED ORDER — MOXIFLOXACIN HCL 0.5 % OP SOLN
1.0000 [drp] | OPHTHALMIC | Status: DC | PRN
  Administered 2018-04-15 (×3): 1 [drp] via OPHTHALMIC

## 2018-04-15 SURGICAL SUPPLY — 27 items

## 2018-04-15 NOTE — Transfer of Care (Signed)
Immediate Anesthesia Transfer of Care Note  Patient: Ashley Young  Procedure(s) Performed: CATARACT EXTRACTION PHACO AND INTRAOCULAR LENS PLACEMENT (IOC) LEFT (Left )  Patient Location: PACU  Anesthesia Type: MAC  Level of Consciousness: awake, alert  and patient cooperative  Airway and Oxygen Therapy: Patient Spontanous Breathing and Patient connected to supplemental oxygen  Post-op Assessment: Post-op Vital signs reviewed, Patient's Cardiovascular Status Stable, Respiratory Function Stable, Patent Airway and No signs of Nausea or vomiting  Post-op Vital Signs: Reviewed and stable  Complications: No apparent anesthesia complications

## 2018-04-15 NOTE — Anesthesia Procedure Notes (Signed)
Procedure Name: MAC Performed by: Tatyana Biber, CRNA Pre-anesthesia Checklist: Patient identified, Emergency Drugs available, Suction available, Timeout performed and Patient being monitored Patient Re-evaluated:Patient Re-evaluated prior to induction Oxygen Delivery Method: Nasal cannula Placement Confirmation: positive ETCO2       

## 2018-04-15 NOTE — Anesthesia Postprocedure Evaluation (Signed)
Anesthesia Post Note  Patient: Ashley Young  Procedure(s) Performed: CATARACT EXTRACTION PHACO AND INTRAOCULAR LENS PLACEMENT (IOC) LEFT (Left )  Patient location during evaluation: PACU Anesthesia Type: MAC Level of consciousness: awake and alert and oriented Pain management: satisfactory to patient Vital Signs Assessment: post-procedure vital signs reviewed and stable Respiratory status: spontaneous breathing, nonlabored ventilation and respiratory function stable Cardiovascular status: blood pressure returned to baseline and stable Postop Assessment: Adequate PO intake and No signs of nausea or vomiting Anesthetic complications: no    Raliegh Ip

## 2018-04-15 NOTE — Anesthesia Preprocedure Evaluation (Signed)
Anesthesia Evaluation  Patient identified by MRN, date of birth, ID band Patient awake    Reviewed: Allergy & Precautions, H&P , NPO status , Patient's Chart, lab work & pertinent test results  Airway Mallampati: II  TM Distance: >3 FB Neck ROM: full    Dental  (+) Upper Dentures, Lower Dentures   Pulmonary former smoker,    Pulmonary exam normal breath sounds clear to auscultation       Cardiovascular +CHF  Normal cardiovascular exam Rhythm:regular Rate:Normal     Neuro/Psych PSYCHIATRIC DISORDERS    GI/Hepatic GERD  ,  Endo/Other    Renal/GU      Musculoskeletal   Abdominal   Peds  Hematology   Anesthesia Other Findings   Reproductive/Obstetrics                             Anesthesia Physical Anesthesia Plan  ASA: III  Anesthesia Plan: MAC   Post-op Pain Management:    Induction:   PONV Risk Score and Plan: 2 and Midazolam and Treatment may vary due to age or medical condition  Airway Management Planned:   Additional Equipment:   Intra-op Plan:   Post-operative Plan:   Informed Consent: I have reviewed the patients History and Physical, chart, labs and discussed the procedure including the risks, benefits and alternatives for the proposed anesthesia with the patient or authorized representative who has indicated his/her understanding and acceptance.     Plan Discussed with: CRNA  Anesthesia Plan Comments:         Anesthesia Quick Evaluation

## 2018-04-15 NOTE — H&P (Signed)
The History and Physical notes are on paper, have been signed, and are to be scanned. The patient remains stable and unchanged from the H&P.   Previous H&P reviewed, patient examined, and there are no changes.  Ashley Young 04/15/2018 1:29 PM

## 2018-04-15 NOTE — Op Note (Signed)
OPERATIVE NOTE  Ashley Crankeratricia B Mcgaha [id number] 04/15/2018   PREOPERATIVE DIAGNOSIS:  Nuclear sclerotic cataract left eye. H25.12   POSTOPERATIVE DIAGNOSIS:    Nuclear sclerotic cataract left eye.     PROCEDURE:  Phacoemusification with posterior chamber intraocular lens placement of the left eye   LENS:   Implant Name Type Inv. Item Serial No. Manufacturer Lot No. LRB No. Used  LENS IOL DIOP 20.5 - [id number] Intraocular Lens LENS IOL DIOP 20.5 [id number]  Left 1        ULTRASOUND TIME: 12  % of 2 minutes 4 seconds, CDE 15.4  SURGEON:  Deirdre Evenerhadwick R. Xavier Fournier, MD   ANESTHESIA:  Topical with tetracaine drops and 2% Xylocaine jelly, augmented with 1% preservative-free intracameral lidocaine.    COMPLICATIONS:  None.   DESCRIPTION OF PROCEDURE:  The patient was identified in the holding room and transported to the operating room and placed in the supine position under the operating microscope.  The left eye was identified as the operative eye and it was prepped and draped in the usual sterile ophthalmic fashion.   A 1 millimeter clear-corneal paracentesis was made at the 1:30 position.  0.5 ml of preservative-free 1% lidocaine was injected into the anterior chamber.  The anterior chamber was filled with Viscoat viscoelastic.  A 2.4 millimeter keratome was used to make a near-clear corneal incision at the 10:30 position.  .  A curvilinear capsulorrhexis was made with a cystotome and capsulorrhexis forceps.  Balanced salt solution was used to hydrodissect and hydrodelineate the nucleus.   Phacoemulsification was then used in stop and chop fashion to remove the lens nucleus and epinucleus.  The remaining cortex was then removed using the irrigation and aspiration handpiece. Provisc was then placed into the capsular bag to distend it for lens placement.  A lens was then injected into the capsular bag.  The remaining viscoelastic was aspirated.   Wounds were hydrated with balanced salt  solution.  The anterior chamber was inflated to a physiologic pressure with balanced salt solution.  No wound leaks were noted. Cefuroxime 0.1 ml of a 10mg /ml solution was injected into the anterior chamber for a dose of 1 mg of intracameral antibiotic at the completion of the case.   Timolol and Brimonidine drops were applied to the eye.  The patient was taken to the recovery room in stable condition without complications of anesthesia or surgery.  [name] 04/15/2018, 2:54 PM

## 2018-04-16 ENCOUNTER — Encounter: Payer: Self-pay | Admitting: Ophthalmology

## 2018-06-03 ENCOUNTER — Inpatient Hospital Stay: Payer: Medicare Other | Attending: Internal Medicine

## 2018-06-03 DIAGNOSIS — E538 Deficiency of other specified B group vitamins: Secondary | ICD-10-CM | POA: Insufficient documentation

## 2018-06-03 DIAGNOSIS — D509 Iron deficiency anemia, unspecified: Secondary | ICD-10-CM

## 2018-06-03 MED ORDER — CYANOCOBALAMIN 1000 MCG/ML IJ SOLN
1000.0000 ug | Freq: Once | INTRAMUSCULAR | Status: AC
  Administered 2018-06-03: 1000 ug via INTRAMUSCULAR

## 2018-06-12 IMAGING — CT CT PELVIS W/O CM
2 of 3 series · 15 of 46 positions shown, 17 images · non-contrast
Comparison: 12/26/2016

CLINICAL DATA: Recent fall with left hip pain, initial encounter

EXAM:
CT PELVIS WITHOUT CONTRAST
TECHNIQUE: Multidetector CT imaging of the pelvis was performed following the
standard protocol without intravenous contrast.

[Series 3: axial st · axial · 0.60mm/px · z∈[-726,-538]mm · 12 of 108 slices shown, 14 images]
[im 7/108  soft-tissue]
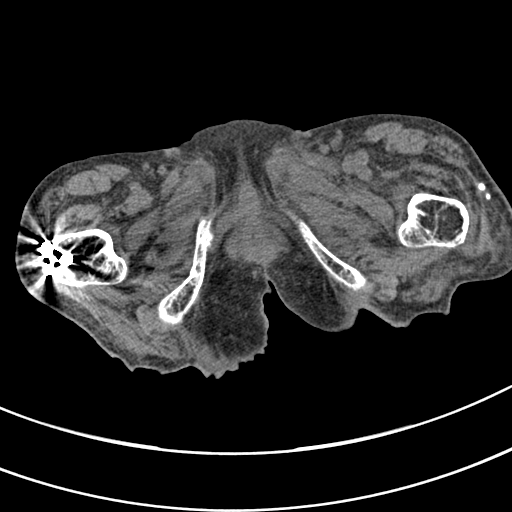
[im 7/108  bone]
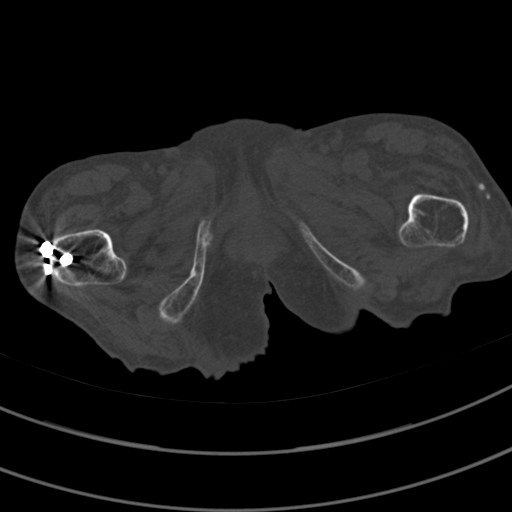
[im 14/108  soft-tissue]
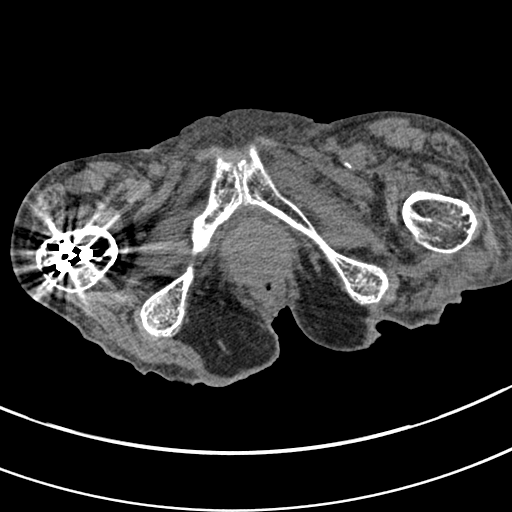
[im 25/108  soft-tissue]
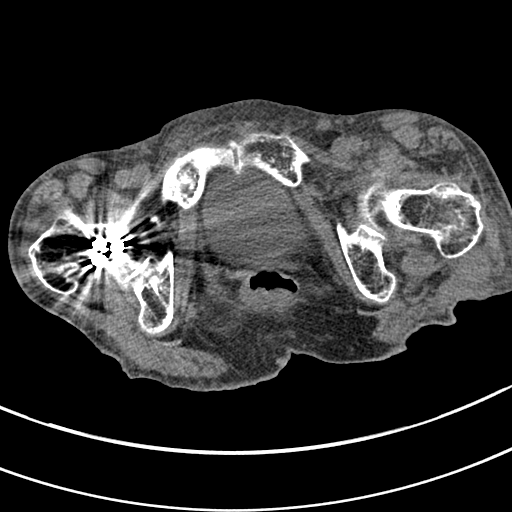
[im 32/108  soft-tissue]
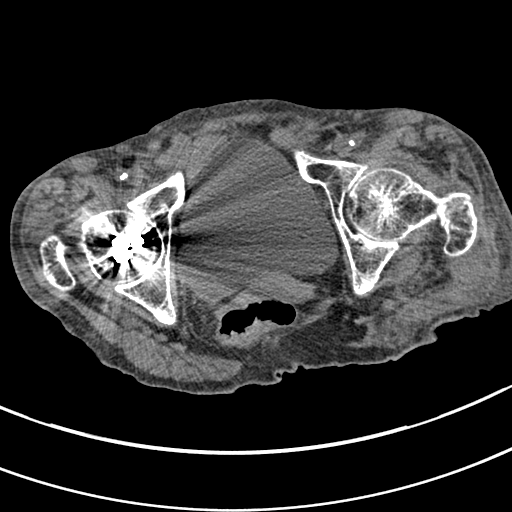
[im 42/108  soft-tissue]
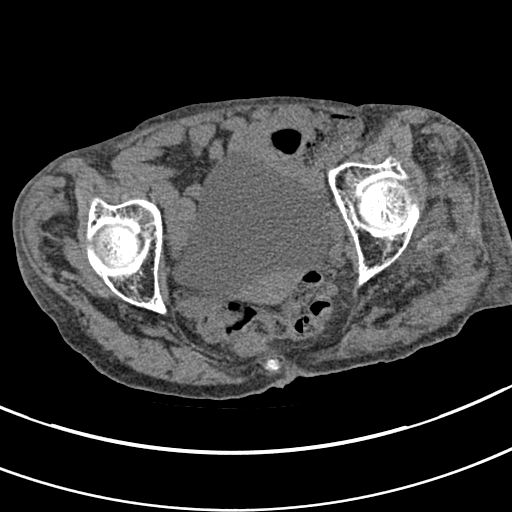
[im 49/108  soft-tissue]
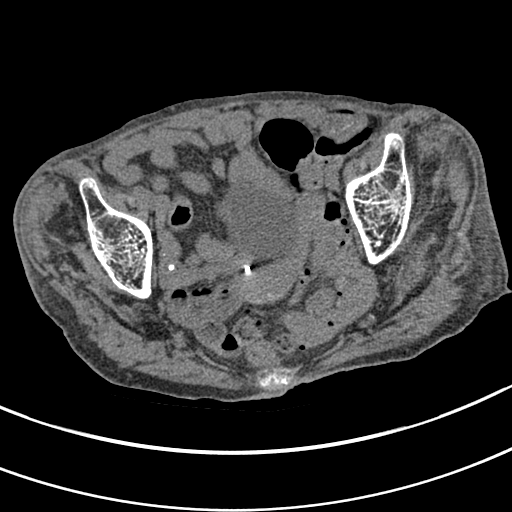
[im 59/108  soft-tissue]
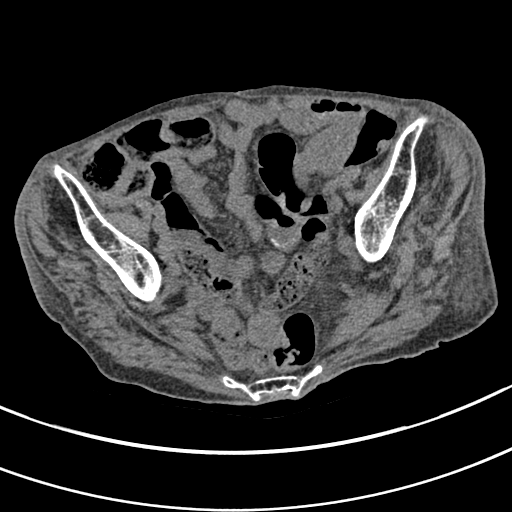
[im 66/108  soft-tissue]
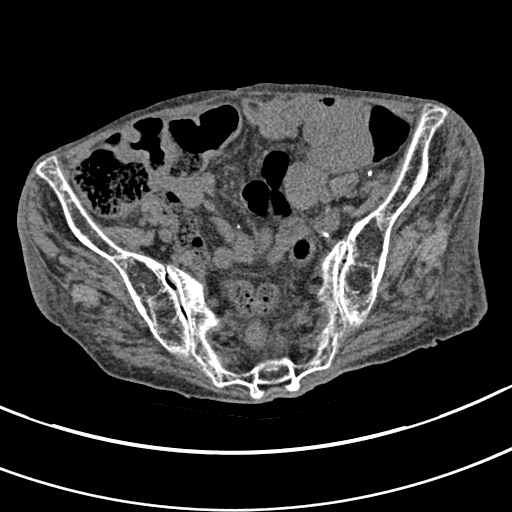
[im 76/108  soft-tissue]
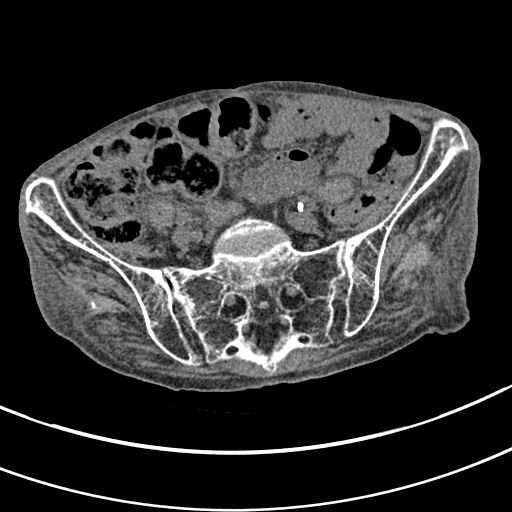
[im 76/108  bone]
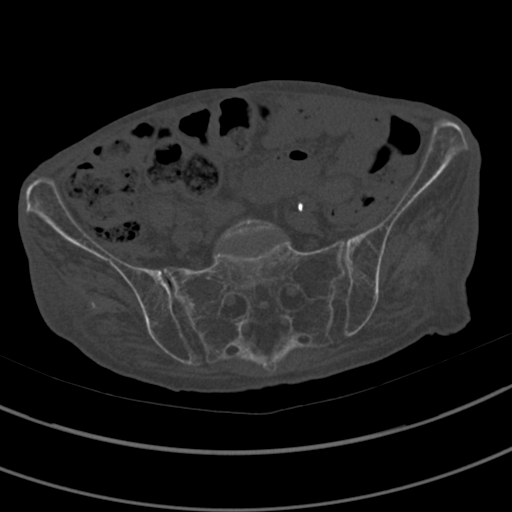
[im 83/108  soft-tissue]
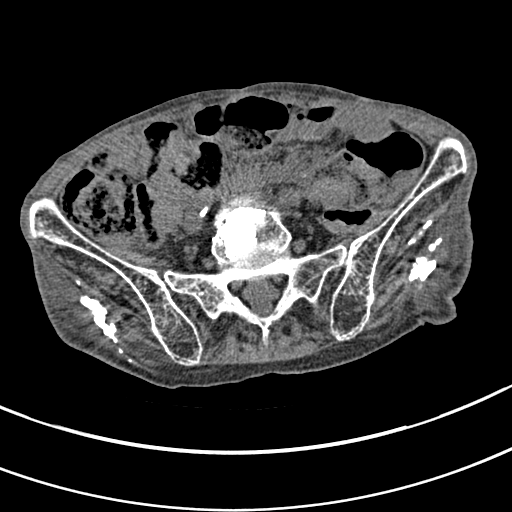
[im 94/108  soft-tissue]
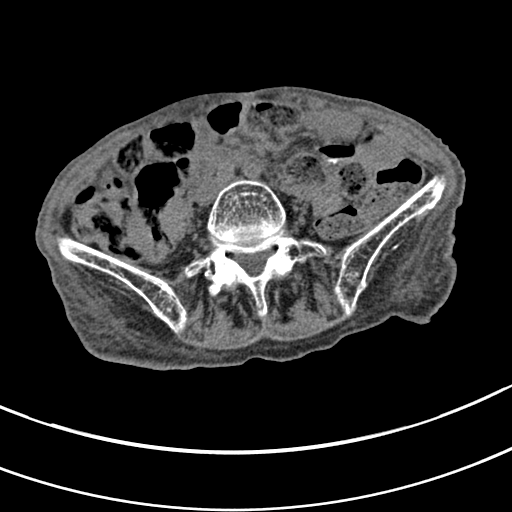
[im 101/108  soft-tissue]
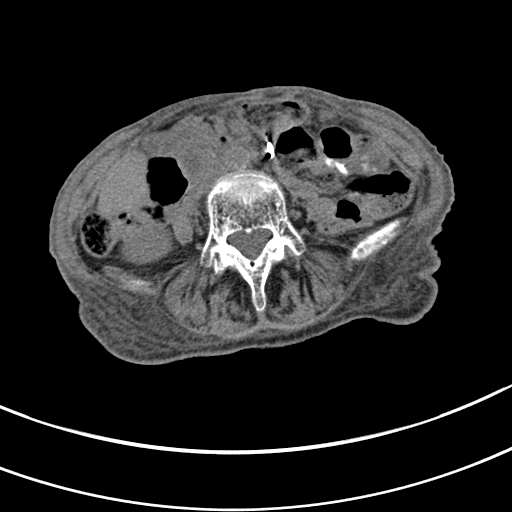

[Series 8: coronal st · coronal · 0.42mm/px · 3 of 98 slices shown]
[im 33/98  soft-tissue]
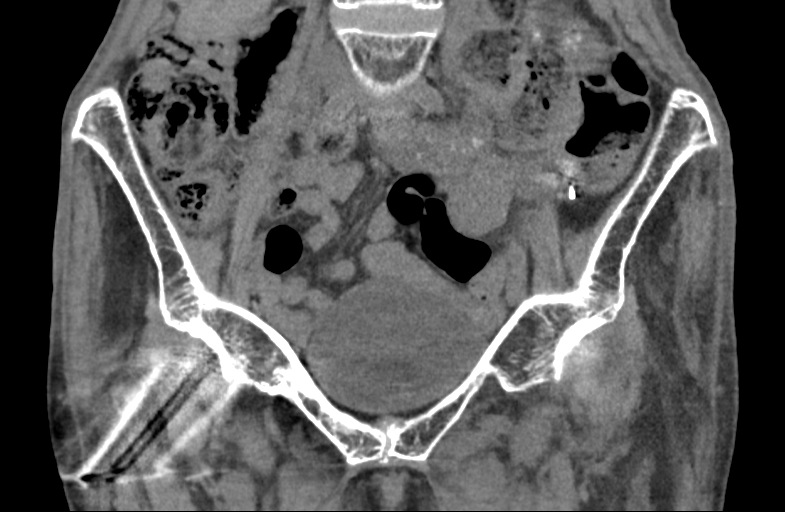
[im 44/98  soft-tissue]
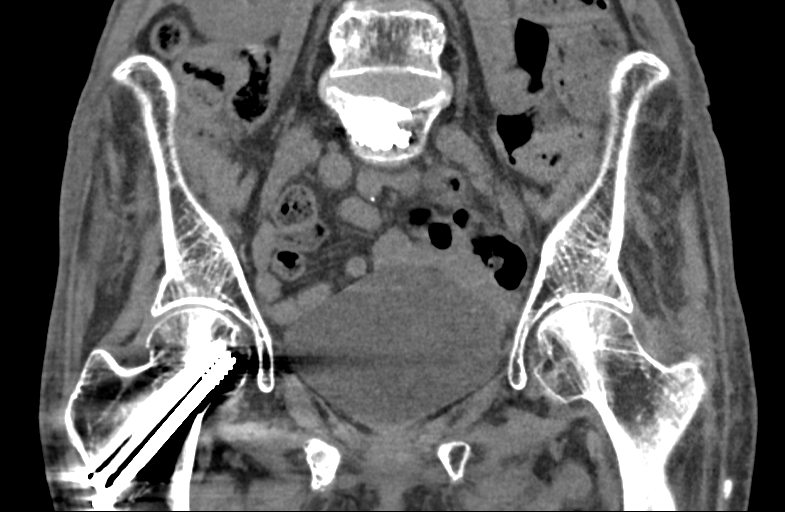
[im 54/98  soft-tissue]
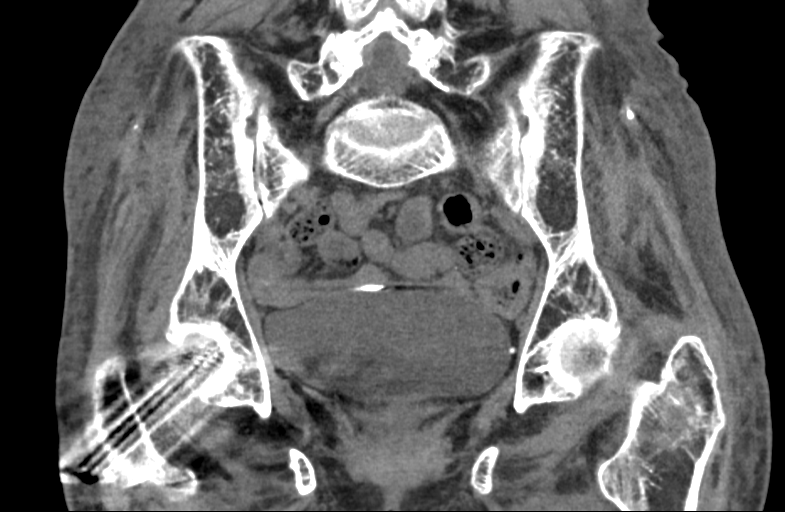

[15 of 46 positions shown; findings below may reference images not displayed]

FINDINGS: Urinary Tract: Tiny nonobstructing right renal stone is noted. The
bladder is well distended.

Bowel:  Visualized bowel appears within normal limits.

Vascular/Lymphatic: Diffuse vascular calcifications are noted. No
significant lymphadenopathy is noted.

Reproductive:  No mass or other significant abnormality

Other:  None.

Musculoskeletal: Mild degenerative changes of lumbar spine are seen.
Changes of prior vertebral augmentation are noted at L5. Healing
pubic rami fractures are noted on the right both in the superior and
inferior pubic ramus. Prior pinning of the proximal right femur is
seen. There is an incomplete fracture of the proximal left femur
involving the femoral neck at its junction with the
intratrochanteric region. A small amount of joint fluid is noted.
Mild subcutaneous edema from the injury is noted. No other fractures
are seen.
IMPRESSION: Incomplete fracture of the left femoral neck at its junction with
the intratrochanteric region. This is best visualized on coronal
reconstructions images of series 6.

Healing right pubic rami fractures as well as prior pinning of the
proximal right femur.

Tiny nonobstructing right renal stone.

## 2018-08-27 ENCOUNTER — Other Ambulatory Visit: Payer: Self-pay

## 2018-08-27 DIAGNOSIS — D500 Iron deficiency anemia secondary to blood loss (chronic): Secondary | ICD-10-CM

## 2018-09-02 ENCOUNTER — Encounter: Payer: Self-pay | Admitting: Internal Medicine

## 2018-09-02 ENCOUNTER — Other Ambulatory Visit: Payer: Self-pay

## 2018-09-02 ENCOUNTER — Inpatient Hospital Stay: Payer: Medicare Other

## 2018-09-02 ENCOUNTER — Inpatient Hospital Stay: Payer: Medicare Other | Attending: Internal Medicine

## 2018-09-02 ENCOUNTER — Inpatient Hospital Stay (HOSPITAL_BASED_OUTPATIENT_CLINIC_OR_DEPARTMENT_OTHER): Payer: Medicare Other | Admitting: Internal Medicine

## 2018-09-02 VITALS — BP 125/75 | HR 111 | Temp 97.2°F | Resp 18 | Ht 62.0 in | Wt 118.0 lb

## 2018-09-02 DIAGNOSIS — R000 Tachycardia, unspecified: Secondary | ICD-10-CM

## 2018-09-02 DIAGNOSIS — E538 Deficiency of other specified B group vitamins: Secondary | ICD-10-CM | POA: Insufficient documentation

## 2018-09-02 DIAGNOSIS — D500 Iron deficiency anemia secondary to blood loss (chronic): Secondary | ICD-10-CM

## 2018-09-02 DIAGNOSIS — Z87891 Personal history of nicotine dependence: Secondary | ICD-10-CM | POA: Insufficient documentation

## 2018-09-02 DIAGNOSIS — D509 Iron deficiency anemia, unspecified: Secondary | ICD-10-CM

## 2018-09-02 DIAGNOSIS — Z79899 Other long term (current) drug therapy: Secondary | ICD-10-CM | POA: Insufficient documentation

## 2018-09-02 LAB — COMPREHENSIVE METABOLIC PANEL
ALT: 18 U/L (ref 0–44)
ANION GAP: 8 (ref 5–15)
AST: 26 U/L (ref 15–41)
Albumin: 4.2 g/dL (ref 3.5–5.0)
Alkaline Phosphatase: 143 U/L — ABNORMAL HIGH (ref 38–126)
BILIRUBIN TOTAL: 0.3 mg/dL (ref 0.3–1.2)
BUN: 17 mg/dL (ref 8–23)
CHLORIDE: 105 mmol/L (ref 98–111)
CO2: 24 mmol/L (ref 22–32)
Calcium: 9.3 mg/dL (ref 8.9–10.3)
Creatinine, Ser: 0.88 mg/dL (ref 0.44–1.00)
GFR calc Af Amer: >60 mL/min (ref >60)
Glucose, Bld: 132 mg/dL — ABNORMAL HIGH (ref 70–99)
POTASSIUM: 3.2 mmol/L — AB (ref 3.5–5.1)
Sodium: 137 mmol/L (ref 135–145)
TOTAL PROTEIN: 7.6 g/dL (ref 6.5–8.1)

## 2018-09-02 LAB — FERRITIN: FERRITIN: 43 ng/mL (ref 11–307)

## 2018-09-02 LAB — VITAMIN B12: VITAMIN B 12: 329 pg/mL (ref 180–914)

## 2018-09-02 LAB — CBC WITH DIFFERENTIAL/PLATELET
ABS IMMATURE GRANULOCYTES: 0.02 10*3/uL (ref 0.00–0.07)
BASOS ABS: 0.1 10*3/uL (ref 0.0–0.1)
Basophils Relative: 1 %
Eosinophils Absolute: 0.2 10*3/uL (ref 0.0–0.5)
Eosinophils Relative: 2 %
HCT: 44.2 % (ref 36.0–46.0)
HEMOGLOBIN: 13.9 g/dL (ref 12.0–15.0)
IMMATURE GRANULOCYTES: 0 %
LYMPHS PCT: 16 %
Lymphs Abs: 1.7 10*3/uL (ref 0.7–4.0)
MCH: 29.1 pg (ref 26.0–34.0)
MCHC: 31.4 g/dL (ref 30.0–36.0)
MCV: 92.7 fL (ref 80.0–100.0)
Monocytes Absolute: 0.6 10*3/uL (ref 0.1–1.0)
Monocytes Relative: 6 %
NEUTROS ABS: 7.7 10*3/uL (ref 1.7–7.7)
NEUTROS PCT: 75 %
NRBC: 0 % (ref 0.0–0.2)
PLATELETS: 312 10*3/uL (ref 150–400)
RBC: 4.77 MIL/uL (ref 3.87–5.11)
RDW: 13.4 % (ref 11.5–15.5)
WBC: 10.2 10*3/uL (ref 4.0–10.5)

## 2018-09-02 LAB — IRON AND TIBC
Iron: 31 ug/dL (ref 28–170)
SATURATION RATIOS: 7 % — AB (ref 10.4–31.8)
TIBC: 419 ug/dL (ref 250–450)
UIBC: 388 ug/dL

## 2018-09-02 MED ORDER — CYANOCOBALAMIN 1000 MCG/ML IJ SOLN
1000.0000 ug | Freq: Once | INTRAMUSCULAR | Status: AC
  Administered 2018-09-02: 1000 ug via INTRAMUSCULAR
  Filled 2018-09-02: qty 1

## 2018-09-02 NOTE — Progress Notes (Signed)
[facility] OFFICE PROGRESS NOTE  Patient Care Team: Angela Coxasanayaka, Gayani Y, MD as PCP - General (Internal Medicine)   SUMMARY OF ONCOLOGIC HISTORY:  # ANEMIA- Multifactorial- Iron def/ B12 def [previous stomach surgeries/PUD]- IV iron last October 2016/  # B12  IM injections q 3 MONTHS [[facility]]  #Irregular heart rhythm-sinus tachycardia with PVCs/congestive heart failure [Dr. Fath]; nursing home  INTERVAL HISTORY:  A pleasant 78 year old female patient with above history of anemia likely secondary to malabsorption/question chronic GI blood loss- currently on IV iron/B12 shot is here for follow-up.    Patient denies any palpitations or syncopal episodes.  Patient is currently in a nursing home.   Denies any fatigue.  Denies any nausea vomiting abdominal pain blood in stools or black or stools.  Review of Systems  Constitutional: Negative for chills, diaphoresis, fever, malaise/fatigue and weight loss.  HENT: Negative for nosebleeds and sore throat.   Eyes: Negative for double vision.  Respiratory: Negative for cough, hemoptysis, sputum production, shortness of breath and wheezing.   Cardiovascular: Negative for chest pain, palpitations, orthopnea and leg swelling.  Gastrointestinal: Negative for abdominal pain, blood in stool, constipation, diarrhea, heartburn, melena, nausea and vomiting.  Genitourinary: Negative for dysuria, frequency and urgency.  Musculoskeletal: Positive for back pain and joint pain.  Skin: Negative.  Negative for itching and rash.  Neurological: Negative for dizziness, tingling, focal weakness, weakness and headaches.  Endo/Heme/Allergies: Does not bruise/bleed easily.  Psychiatric/Behavioral: Negative for depression. The patient is not nervous/anxious and does not have insomnia.      PAST MEDICAL HISTORY :  Past Medical History:  Diagnosis Date  . Arthritis   . B12 deficiency   . CHF (congestive heart failure) (HCC)   . Chronic systolic  heart failure (HCC)   . Dementia (HCC)   . Depression   . Dysrhythmia    TACHYCARDIA  . Failure to thrive (0-17)   . GERD (gastroesophageal reflux disease)   . Glaucoma   . Headache    MIGRAINES (now approx 1x/month)  . History of kidney stones   . Hypercholesterolemia   . IDA (iron deficiency anemia)   . Migraine   . Mood disorder (HCC)   . Multiple gastric ulcers   . Osteoporosis   . Renal disorder    Multiple episodes of kidney stones.   . Wears dentures    full upper and lower    PAST SURGICAL HISTORY :   Past Surgical History:  Procedure Laterality Date  . ABDOMINAL SURGERY     Reconstructed stomach and 3 different surgeries.   . APPENDECTOMY    . BREAST SURGERY     cyst removal  . CATARACT EXTRACTION W/PHACO Right 06/05/2015   Procedure: CATARACT EXTRACTION PHACO AND INTRAOCULAR LENS PLACEMENT (IOC);  Surgeon: Lockie Molahadwick Brasington, MD;  Location: [facility] OR[facility];  Service: Ophthalmology;  Laterality: Right;  US 01:06   . CATARACT EXTRACTION W/PHACO Left 04/15/2018   Procedure: CATARACT EXTRACTION PHACO AND INTRAOCULAR LENS PLACEMENT (IOC) LEFT;  Surgeon: Lockie MolaBrasington, Chadwick, MD;  [facility];  Service: Ophthalmology;  Laterality: Left;  . CESAREAN SECTION     x 3  . CHOLECYSTECTOMY    . COLONOSCOPY    . ESOPHAGOGASTRODUODENOSCOPY    . INTRAMEDULLARY (IM) NAIL INTERTROCHANTERIC Left 12/29/2016   Procedure: INTRAMEDULLARY (IM) NAIL INTERTROCHANTRIC;  Surgeon: Kennedy BuckerMenz, Michael, MD;  Location: [facility];  Service: Orthopedics;  Laterality: Left;  . KYPHOPLASTY    . KYPHOPLASTY    . LITHOTRIPSY  and stone extraction    FAMILY HISTORY :   Family History  Problem Relation Age of Onset  . Breast cancer Mother   . Lung cancer Father     SOCIAL HISTORY:   Social History   Tobacco Use  . Smoking status: Former Smoker    Types: Cigarettes    Last attempt to quit: 11/29/1993    Years since quitting: 24.7  . Smokeless tobacco: Never Used  Substance Use  Topics  . Alcohol use: No    Alcohol/week: 1.0 standard drinks    Types: 1 Standard drinks or equivalent per week  . Drug use: No    ALLERGIES:  is allergic to penicillins; aspirin; ferrous gluconate; iron polysaccharide; nsaids; other; sulfamethoxazole-trimethoprim; tolmetin; and venofer [iron sucrose].  MEDICATIONS:  Current Outpatient Medications  Medication Sig Dispense Refill  . acetaminophen (TYLENOL) 325 MG tablet Take 2 tablets (650 mg total) by mouth every 6 (six) hours as needed for mild pain (or Fever >/= 101).    . ARIPiprazole (ABILIFY) 10 MG tablet Take 10 mg by mouth daily.    . calcium-vitamin D (OSCAL WITH D) 500-200 MG-UNIT tablet Take 1 tablet by mouth daily with breakfast.    . DULoxetine (CYMBALTA) 20 MG capsule Take 20 mg by mouth daily.    . furosemide (LASIX) 20 MG tablet Take 2 tablets by mouth daily.    Marland Kitchen latanoprost (XALATAN) 0.005 % ophthalmic solution Place 1 drop into both eyes at bedtime.     . mirtazapine (REMERON) 7.5 MG tablet Take 7.5 mg by mouth at bedtime.    . Multiple Vitamin (MULTIVITAMIN) tablet Take 1 tablet by mouth daily.    Marland Kitchen nystatin cream (MYCOSTATIN) Apply 1 application topically 2 (two) times daily as needed for dry skin.    . pantoprazole (PROTONIX) 40 MG tablet Take 40 mg by mouth daily.     . potassium chloride (K-DUR,KLOR-CON) 10 MEQ tablet Take 1 tablet by mouth daily.    . SUMAtriptan (IMITREX) 100 MG tablet 100 mg every 2 (two) hours as needed.     . gabapentin (NEURONTIN) 100 MG capsule Take 100 mg by mouth at bedtime.    Marland Kitchen loperamide (IMODIUM A-D) 2 MG tablet Take 2 mg by mouth 3 (three) times daily as needed for diarrhea or loose stools.    . ondansetron (ZOFRAN) 4 MG tablet Take 4 mg by mouth every 8 (eight) hours as needed for nausea or vomiting.    Ethelda Chick (OYSTER CALCIUM) 500 MG TABS tablet Take 500 mg of elemental calcium by mouth daily.    . polyethylene glycol (MIRALAX / GLYCOLAX) packet Take 17 g by mouth daily as  needed.     No current facility-administered medications for this visit.     PHYSICAL EXAMINATION:  BP 125/75 (BP Location: Left Arm, Patient Position: Sitting)   Pulse (!) 111   Temp (!) 97.2 F (36.2 C) (Tympanic)   Resp 18   Ht 5\' 2"  (1.575 m)   Wt 118 lb (53.5 kg)   BMI 21.58 kg/m   Filed Weights   09/02/18 1355  Weight: 118 lb (53.5 kg)    Physical Exam  Constitutional: She is oriented to person, place, and time.  Elderly frail-appearing female patient.  She is in a wheelchair.  Accompanied by husband.  HENT:  Head: Normocephalic and atraumatic.  Mouth/Throat: Oropharynx is clear and moist. No oropharyngeal exudate.  Eyes: Pupils are equal, round, and reactive to light.  Neck: Normal range of  motion. Neck supple.  Cardiovascular:  Tachycardic irregular rhythm  Pulmonary/Chest: No respiratory distress. She has no wheezes.  Abdominal: Soft. Bowel sounds are normal. She exhibits no distension and no mass. There is no abdominal tenderness. There is no rebound and no guarding.  Musculoskeletal: Normal range of motion.        General: No tenderness or edema.  Neurological: She is alert and oriented to person, place, and time.  Skin: Skin is warm.  Psychiatric: Affect normal.    LABORATORY DATA:  I have reviewed the data as listed    Component Value Date/Time   NA 137 09/02/2018 1331   NA 139 11/10/2014 0503   K 3.2 (L) 09/02/2018 1331   K 3.6 11/10/2014 0503   CL 105 09/02/2018 1331   CL 107 11/10/2014 0503   CO2 24 09/02/2018 1331   CO2 24 11/10/2014 0503   GLUCOSE 132 (H) 09/02/2018 1331   GLUCOSE 84 11/10/2014 0503   BUN 17 09/02/2018 1331   BUN 7 11/10/2014 0503   CREATININE 0.88 09/02/2018 1331   CREATININE 0.56 11/10/2014 0503   CALCIUM 9.3 09/02/2018 1331   CALCIUM 7.8 (L) 11/10/2014 0503   PROT 7.6 09/02/2018 1331   PROT 6.9 11/09/2014 1254   ALBUMIN 4.2 09/02/2018 1331   ALBUMIN 3.5 11/09/2014 1254   AST 26 09/02/2018 1331   AST 19 11/09/2014  1254   ALT 18 09/02/2018 1331   ALT 15 11/09/2014 1254   ALKPHOS 143 (H) 09/02/2018 1331   ALKPHOS 155 (H) 11/09/2014 1254   BILITOT 0.3 09/02/2018 1331   BILITOT 0.3 11/09/2014 1254   GFRNONAA >60 09/02/2018 1331   GFRNONAA >60 11/10/2014 0503   GFRAA >60 09/02/2018 1331   GFRAA >60 11/10/2014 0503    No results found for: SPEP, UPEP  Lab Results  Component Value Date   WBC 10.2 09/02/2018   NEUTROABS 7.7 09/02/2018   HGB 13.9 09/02/2018   HCT 44.2 09/02/2018   MCV 92.7 09/02/2018   PLT 312 09/02/2018      Chemistry      Component Value Date/Time   NA 137 09/02/2018 1331   NA 139 11/10/2014 0503   K 3.2 (L) 09/02/2018 1331   K 3.6 11/10/2014 0503   CL 105 09/02/2018 1331   CL 107 11/10/2014 0503   CO2 24 09/02/2018 1331   CO2 24 11/10/2014 0503   BUN 17 09/02/2018 1331   BUN 7 11/10/2014 0503   CREATININE 0.88 09/02/2018 1331   CREATININE 0.56 11/10/2014 0503      Component Value Date/Time   CALCIUM 9.3 09/02/2018 1331   CALCIUM 7.8 (L) 11/10/2014 0503   ALKPHOS 143 (H) 09/02/2018 1331   ALKPHOS 155 (H) 11/09/2014 1254   AST 26 09/02/2018 1331   AST 19 11/09/2014 1254   ALT 18 09/02/2018 1331   ALT 15 11/09/2014 1254   BILITOT 0.3 09/02/2018 1331   BILITOT 0.3 11/09/2014 1254       RADIOGRAPHIC STUDIES: I have personally reviewed the radiological images as listed and agreed with the findings in the report. No results found.   ASSESSMENT & PLAN:  Iron deficiency anemia due to chronic blood loss # Anemia- multifactorial/iron deficiency-B12 deficiency secondary to previous GI surgeries.  Improved.  Today hemoglobin is 13.3 [previously 9-10].  Hold IV iron today.  #  B12 deficiency- continue B12 shots every 3 months; Today.  Stable.  # Irregular heart rate/ tachycardic- prior; but no dx of a.fib. [prior Dr.fath/ Hx of  CHF]- EKG-reviewed and interpreted independently-sinus rhythm with multiple PVCs.  No A. Fib.  Recommend continued follow-up with  cardiology.  # DISPOSITION: # EKG today.  # B12 injection today; NO IV iron infusion today # b12 injection in 3 months # follow up in 6 months-MD; cbc/bmp- B12 injection/ Possible ferrahem-Dr.B       Earna CoderGovinda R Shiann Kam, MD 09/02/2018 7:32 PM

## 2018-09-02 NOTE — Assessment & Plan Note (Addendum)
#   Anemia- multifactorial/iron deficiency-B12 deficiency secondary to previous GI surgeries.  Improved.  Today hemoglobin is 13.3 [previously 9-10].  Hold IV iron today.  #  B12 deficiency- continue B12 shots every 3 months; Today.  Stable.  # Irregular heart rate/ tachycardic- prior; but no dx of a.fib. [prior Dr.fath/ Hx of CHF]- EKG-reviewed and interpreted independently-sinus rhythm with multiple PVCs.  No A. Fib.  Recommend continued follow-up with cardiology.  # DISPOSITION: # EKG today.  # B12 injection today; NO IV iron infusion today # b12 injection in 3 months # follow up in 6 months-MD; cbc/bmp- B12 injection/ Possible ferrahem-Dr.B

## 2018-12-02 ENCOUNTER — Inpatient Hospital Stay: Payer: Medicare Other

## 2019-01-07 ENCOUNTER — Encounter: Payer: Self-pay | Admitting: Internal Medicine

## 2019-03-02 ENCOUNTER — Inpatient Hospital Stay: Payer: Medicare Other

## 2019-03-02 ENCOUNTER — Inpatient Hospital Stay: Payer: Medicare Other | Admitting: Internal Medicine

## 2019-09-08 ENCOUNTER — Encounter: Payer: Self-pay | Admitting: Emergency Medicine

## 2019-09-08 ENCOUNTER — Emergency Department: Payer: Medicare PPO

## 2019-09-08 ENCOUNTER — Emergency Department
Admission: EM | Admit: 2019-09-08 | Discharge: 2019-09-08 | Disposition: A | Discharge status: Home / Self Care | Payer: Medicare PPO | Attending: Emergency Medicine | Admitting: Emergency Medicine

## 2019-09-08 ENCOUNTER — Other Ambulatory Visit: Payer: Self-pay

## 2019-09-08 DIAGNOSIS — Z79899 Other long term (current) drug therapy: Secondary | ICD-10-CM | POA: Diagnosis not present

## 2019-09-08 DIAGNOSIS — S40212A Abrasion of left shoulder, initial encounter: Secondary | ICD-10-CM | POA: Insufficient documentation

## 2019-09-08 DIAGNOSIS — Y92000 Kitchen of unspecified non-institutional (private) residence as  the place of occurrence of the external cause: Secondary | ICD-10-CM | POA: Diagnosis not present

## 2019-09-08 DIAGNOSIS — S79911A Unspecified injury of right hip, initial encounter: Secondary | ICD-10-CM | POA: Insufficient documentation

## 2019-09-08 DIAGNOSIS — S4992XA Unspecified injury of left shoulder and upper arm, initial encounter: Secondary | ICD-10-CM | POA: Diagnosis present

## 2019-09-08 DIAGNOSIS — F0390 Unspecified dementia without behavioral disturbance: Secondary | ICD-10-CM | POA: Diagnosis not present

## 2019-09-08 DIAGNOSIS — T148XXA Other injury of unspecified body region, initial encounter: Secondary | ICD-10-CM

## 2019-09-08 DIAGNOSIS — Y92009 Unspecified place in unspecified non-institutional (private) residence as the place of occurrence of the external cause: Secondary | ICD-10-CM

## 2019-09-08 DIAGNOSIS — W1839XA Other fall on same level, initial encounter: Secondary | ICD-10-CM | POA: Insufficient documentation

## 2019-09-08 DIAGNOSIS — Z23 Encounter for immunization: Secondary | ICD-10-CM | POA: Insufficient documentation

## 2019-09-08 DIAGNOSIS — I5022 Chronic systolic (congestive) heart failure: Secondary | ICD-10-CM | POA: Diagnosis not present

## 2019-09-08 DIAGNOSIS — M25551 Pain in right hip: Secondary | ICD-10-CM | POA: Insufficient documentation

## 2019-09-08 DIAGNOSIS — Y999 Unspecified external cause status: Secondary | ICD-10-CM | POA: Diagnosis not present

## 2019-09-08 DIAGNOSIS — Y9389 Activity, other specified: Secondary | ICD-10-CM | POA: Diagnosis not present

## 2019-09-08 DIAGNOSIS — W19XXXA Unspecified fall, initial encounter: Secondary | ICD-10-CM

## 2019-09-08 LAB — BASIC METABOLIC PANEL
Anion gap: 9 (ref 5–15)
BUN: 20 mg/dL (ref 8–23)
CO2: 29 mmol/L (ref 22–32)
Calcium: 9.3 mg/dL (ref 8.9–10.3)
Chloride: 102 mmol/L (ref 98–111)
Creatinine, Ser: 0.69 mg/dL (ref 0.44–1.00)
GFR calc Af Amer: >60 mL/min (ref >60)
GFR calc non Af Amer: >60 mL/min (ref >60)
Glucose, Bld: 108 mg/dL — ABNORMAL HIGH (ref 70–99)
Potassium: 3.7 mmol/L (ref 3.5–5.1)
Sodium: 140 mmol/L (ref 135–145)

## 2019-09-08 LAB — CBC WITH DIFFERENTIAL/PLATELET
Abs Immature Granulocytes: 0.03 10*3/uL (ref 0.00–0.07)
Basophils Absolute: 0.1 10*3/uL (ref 0.0–0.1)
Basophils Relative: 1 %
Eosinophils Absolute: 0.2 10*3/uL (ref 0.0–0.5)
Eosinophils Relative: 2 %
HCT: 36.8 % (ref 36.0–46.0)
Hemoglobin: 10.8 g/dL — ABNORMAL LOW (ref 12.0–15.0)
Immature Granulocytes: 0 %
Lymphocytes Relative: 18 %
Lymphs Abs: 1.7 10*3/uL (ref 0.7–4.0)
MCH: 23.6 pg — ABNORMAL LOW (ref 26.0–34.0)
MCHC: 29.3 g/dL — ABNORMAL LOW (ref 30.0–36.0)
MCV: 80.3 fL (ref 80.0–100.0)
Monocytes Absolute: 0.6 10*3/uL (ref 0.1–1.0)
Monocytes Relative: 6 %
Neutro Abs: 6.7 10*3/uL (ref 1.7–7.7)
Neutrophils Relative %: 73 %
Platelets: 408 10*3/uL — ABNORMAL HIGH (ref 150–400)
RBC: 4.58 MIL/uL (ref 3.87–5.11)
RDW: 16.6 % — ABNORMAL HIGH (ref 11.5–15.5)
WBC: 9.2 10*3/uL (ref 4.0–10.5)
nRBC: 0 % (ref 0.0–0.2)

## 2019-09-08 LAB — URINALYSIS, COMPLETE (UACMP) WITH MICROSCOPIC
Bacteria, UA: NONE SEEN
Bilirubin Urine: NEGATIVE
Glucose, UA: NEGATIVE mg/dL
Hgb urine dipstick: NEGATIVE
Ketones, ur: NEGATIVE mg/dL
Leukocytes,Ua: NEGATIVE
Nitrite: NEGATIVE
Protein, ur: NEGATIVE mg/dL
Specific Gravity, Urine: 1.025 (ref 1.005–1.030)
Squamous Epithelial / HPF: NONE SEEN (ref 0–5)
pH: 5 (ref 5.0–8.0)

## 2019-09-08 LAB — CK: Total CK: 255 U/L — ABNORMAL HIGH (ref 38–234)

## 2019-09-08 MED ORDER — TETANUS-DIPHTH-ACELL PERTUSSIS 5-2.5-18.5 LF-MCG/0.5 IM SUSP
0.5000 mL | Freq: Once | INTRAMUSCULAR | Status: AC
  Administered 2019-09-08: 10:00:00 0.5 mL via INTRAMUSCULAR
  Filled 2019-09-08: qty 0.5

## 2019-09-08 NOTE — Discharge Instructions (Addendum)
Your x-rays and CT scans today were all okay.  Your lab tests were also unremarkable.  Please follow-up with your doctor for continued monitoring of your symptoms.

## 2019-09-08 NOTE — ED Notes (Signed)
This RN spoke with husband regarding d/c, no further questions or concerns at this time.  Pt unable to sign for d/c

## 2019-09-08 NOTE — ED Provider Notes (Signed)
[facility] Emergency Department Provider Note  ____________________________________________  Time seen: Approximately 10:29 AM  I have reviewed the triage vital signs and the nursing notes.   HISTORY  Chief Complaint Fall  Level 5 Caveat: Portions of the History and Physical including HPI and review of systems are unable to be completely obtained due to patient being a poor historian due to dementia   HPI Ashley Young is a 79 y.o. female with a history of CHF, dementia, GERD who was found on the floor in the kitchen this morning by her husband.  Unknown time of fall.  Patient states that she was in the kitchen and just lost her balance.  Complains of pain in the right hip and the left shoulder.  Think she might of hit her head, denies blood thinners.  Pain is constant, worse with movement.  Nonradiating.  Mild to moderate intensity.   EMS note patient was ambulatory on scene.   Past Medical History:  Diagnosis Date  . Arthritis   . B12 deficiency   . CHF (congestive heart failure) (HCC)   . Chronic systolic heart failure (HCC)   . Dementia (HCC)   . Depression   . Dysrhythmia    TACHYCARDIA  . Failure to thrive (0-17)   . GERD (gastroesophageal reflux disease)   . Glaucoma   . Headache    MIGRAINES (now approx 1x/month)  . History of kidney stones   . Hypercholesterolemia   . IDA (iron deficiency anemia)   . Migraine   . Mood disorder (HCC)   . Multiple gastric ulcers   . Osteoporosis   . Renal disorder    Multiple episodes of kidney stones.   . Wears dentures    full upper and lower     Patient Active Problem List   Diagnosis Date Noted  . Mood disorder (HCC)   . DNR (do not resuscitate) 12/30/2016  . Hip fracture, left (HCC)   . Palliative care encounter   . Hypokalemia 12/27/2016  . Severe recurrent major depression without psychotic features (HCC) 12/24/2016  . Failure to thrive (0-17) 12/24/2016  . Chronic systolic heart  failure (HCC) 65/68/1275  . Borderline diabetes mellitus 01/02/2016  . Pure hypercholesterolemia 01/02/2016  . Goals of care, counseling/discussion 01/02/2016  . B12 deficiency 04/26/2015  . Iron deficiency anemia due to chronic blood loss 04/26/2015  . History of anemia 03/28/2015  . 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 DDD (degenerative disc disease), lumbar 03/09/2014  . Degeneration of intervertebral disc of lumbar region 03/09/2014  . Avitaminosis D 02/09/2014  . Anxiety state 01/04/2014  . Clinical depression 01/04/2014  . Bilateral lower extremity edema 01/04/2014  . Recurrent major depression in remission (HCC) 01/04/2014  . Abdominal pain 11/19/2013  . Acquired gastric outlet stenosis 08/06/2013  . Adverse effect of salicylate 08/25/2013  . Anxiety and depression 08/12/2013  . Esophagitis 08/25/2013  . H/O gastrointestinal hemorrhage 08/04/2013  . H/O gastric ulcer 08/01/2013  . Disorder of nutrition 08/07/2013  . Headache, migraine 08/12/2013  . Calculus of kidney 08/28/2013  . OP (osteoporosis) 08/14/2013     Past Surgical History:  Procedure Laterality Date  . ABDOMINAL SURGERY     Reconstructed stomach and 3 different surgeries.   . APPENDECTOMY    . BREAST SURGERY     cyst removal  . CATARACT EXTRACTION W/PHACO Right 06/05/2015   Procedure: CATARACT EXTRACTION PHACO AND INTRAOCULAR LENS PLACEMENT (IOC);  Surgeon: Lockie Mola, MD;  Location: [facility] ORS;  Service:  Ophthalmology;  Laterality: Right;  [place] 01:06   . CATARACT EXTRACTION W/PHACO Left 04/15/2018   Procedure: CATARACT EXTRACTION PHACO AND INTRAOCULAR LENS PLACEMENT (IOC) LEFT;  Surgeon: Lockie Mola, MD;  Location: [facility];  Service: Ophthalmology;  Laterality: Left;  . CESAREAN SECTION     x 3  . CHOLECYSTECTOMY    . COLONOSCOPY    . ESOPHAGOGASTRODUODENOSCOPY    . INTRAMEDULLARY (IM) NAIL INTERTROCHANTERIC Left 12/29/2016   Procedure: INTRAMEDULLARY (IM) NAIL  INTERTROCHANTRIC;  Surgeon: Kennedy Bucker, MD;  Location: [facility] OR[facility];  Service: Orthopedics;  Laterality: Left;  . KYPHOPLASTY    . KYPHOPLASTY    . LITHOTRIPSY     and stone extraction     Prior to Admission medications   Medication Sig Start Date End Date Taking? Authorizing Provider  acetaminophen (TYLENOL) 325 MG tablet Take 2 tablets (650 mg total) by mouth every 6 (six) hours as needed for mild pain (or Fever >/= 101). 12/31/16  Yes Auburn Bilberry, MD  ARIPiprazole (ABILIFY) 10 MG tablet Take 10 mg by mouth daily.   Yes [provider]  atorvastatin (LIPITOR) 20 MG tablet Take 20 mg by mouth at bedtime. 08/27/19  Yes [provider]  calcium-vitamin D (OSCAL WITH D) 500-200 MG-UNIT tablet Take 1 tablet by mouth daily with breakfast.   Yes [provider]  DULoxetine (CYMBALTA) 30 MG capsule Take 30 mg by mouth daily.    Yes [provider]  furosemide (LASIX) 40 MG tablet Take 40 mg by mouth every morning. 08/27/19  Yes [provider]  gabapentin (NEURONTIN) 100 MG capsule Take 100 mg by mouth at bedtime.   Yes [provider]  latanoprost (XALATAN) 0.005 % ophthalmic solution Place 1 drop into both eyes at bedtime.  11/18/14  Yes [provider]  Multiple Vitamin (MULTIVITAMIN) tablet Take 1 tablet by mouth daily.   Yes [provider]  MYRBETRIQ 25 MG TB24 tablet Take 25 mg by mouth daily. 08/27/19  Yes [provider]  nystatin cream (MYCOSTATIN) Apply 1 application topically 2 (two) times daily as needed for dry skin.   Yes [provider]  pantoprazole (PROTONIX) 40 MG tablet Take 40 mg by mouth daily.  09/08/14  Yes [provider]  potassium chloride (K-DUR,KLOR-CON) 10 MEQ tablet Take 1 tablet by mouth daily. 04/14/17  Yes [provider]  rOPINIRole (REQUIP) 0.5 MG tablet Take 0.5 mg by mouth at bedtime. 08/20/19  Yes [provider]  traZODone (DESYREL) 50 MG tablet Take  25 mg by mouth at bedtime. 08/27/19  Yes [provider]  vitamin B-12 (CYANOCOBALAMIN) 500 MCG tablet Take 500 mcg by mouth daily.   Yes [provider]  loperamide (IMODIUM A-D) 2 MG tablet Take 2 mg by mouth 3 (three) times daily as needed for diarrhea or loose stools.    [provider]  ondansetron (ZOFRAN) 4 MG tablet Take 4 mg by mouth every 8 (eight) hours as needed for nausea or vomiting.    [provider]  Ethelda Chick (OYSTER CALCIUM) 500 MG TABS tablet Take 500 mg of elemental calcium by mouth daily.    [provider]  polyethylene glycol (MIRALAX / GLYCOLAX) packet Take 17 g by mouth daily as needed.    [provider]  SUMAtriptan (IMITREX) 100 MG tablet 100 mg every 2 (two) hours as needed.  12/30/14   [provider]  traMADol (ULTRAM) 50 MG tablet Take 50 mg by mouth at bedtime as  needed. 08/16/19   [provider]     Allergies Penicillins, Aspirin, Ferrous gluconate, Iron polysaccharide, Nsaids, Other, Sulfamethoxazole-trimethoprim, Tolmetin, and Venofer [iron sucrose]   Family History  Problem Relation Age of Onset  . Breast cancer Mother   . Lung cancer Father     Social History Social History   Tobacco Use  . Smoking status: Former Smoker    Types: Cigarettes    Quit date: 11/29/1993    Years since quitting: 25.7  . Smokeless tobacco: Never Used  Substance Use Topics  . Alcohol use: No    Alcohol/week: 1.0 standard drinks    Types: 1 Standard drinks or equivalent per week  . Drug use: No    Review of Systems  Constitutional:   No fever or chills.  ENT:   No sore throat. No rhinorrhea. Cardiovascular:   No chest pain or syncope. Respiratory:   No dyspnea or cough. Gastrointestinal:   Negative for abdominal pain, vomiting and diarrhea.  Musculoskeletal: Right hip pain, left shoulder pain as above. All other systems reviewed and are negative except as documented above in ROS and  HPI.  ____________________________________________   PHYSICAL EXAM:  VITAL SIGNS: ED Triage Vitals  Enc Vitals Group     BP 09/08/19 0804 (!) 118/94     Pulse Rate 09/08/19 0805 86     Resp 09/08/19 0809 (!) 8     Temp 09/08/19 0819 97.9 F (36.6 C)     Temp Source 09/08/19 0819 Oral     SpO2 09/08/19 0805 97 %     Weight 09/08/19 0821 127 lb 6.8 oz (57.8 kg)     Height 09/08/19 0821 5\' 2"  (1.575 m)     Head Circumference --      Peak Flow --      Pain Score 09/08/19 0820 0     Pain Loc --      Pain Edu? --      Excl. in GC? --     Vital signs reviewed, nursing assessments reviewed.   Constitutional:   Alert and oriented. Non-toxic appearance. Eyes:   Conjunctivae are normal. EOMI. PERRL. ENT      Head:   Normocephalic and atraumatic.      Nose:   Wearing a mask.      Mouth/Throat:   Wearing a mask.      Neck:   No meningismus. Full ROM.  No midline spinal tenderness Hematological/Lymphatic/Immunilogical:   No cervical lymphadenopathy. Cardiovascular:   RRR. Symmetric bilateral radial and DP pulses.  No murmurs. Cap refill less than 2 seconds. Respiratory:   Normal respiratory effort without tachypnea/retractions. Breath sounds are clear and equal bilaterally. No wheezes/rales/rhonchi. Gastrointestinal:   Soft and nontender. Non distended. There is no CVA tenderness.  No rebound, rigidity, or guarding.  Musculoskeletal: Tenderness about the left shoulder without focal bony tenderness.  There is right hip tenderness as well but intact range of motion.  No crepitus.  No lacerations. Neurologic:   Normal speech and language.  Motor grossly intact. No acute focal neurologic deficits are appreciated.  Skin:    Skin is warm, dry and intact. No rash noted.  No petechiae, purpura, or bullae.  ____________________________________________    LABS (pertinent positives/negatives) (all labs ordered are listed, but only abnormal results are displayed) Labs Reviewed  BASIC  METABOLIC PANEL - Abnormal; Notable for the following components:      Result Value   Glucose, Bld 108 (*)    All other components  within normal limits  CBC WITH DIFFERENTIAL/PLATELET - Abnormal; Notable for the following components:   Hemoglobin 10.8 (*)    MCH 23.6 (*)    MCHC 29.3 (*)    RDW 16.6 (*)    Platelets 408 (*)    All other components within normal limits  URINALYSIS, COMPLETE (UACMP) WITH MICROSCOPIC - Abnormal; Notable for the following components:   Color, Urine YELLOW (*)    APPearance CLEAR (*)    All other components within normal limits  CK - Abnormal; Notable for the following components:   Total CK 255 (*)    All other components within normal limits  URINE CULTURE   ____________________________________________   EKG  Interpreted by me Sinus rhythm rate of 81, normal axis and intervals.  Normal QRS ST segments and T waves.  1 PVC on the strip.  ____________________________________________    RADIOLOGY  DG Chest 2 View  Result Date: 09/08/2019 CLINICAL DATA:  Left chest wall pain after fall. EXAM: CHEST - 2 VIEW COMPARISON:  October 20, 2017. FINDINGS: The heart size and mediastinal contours are within normal limits. Both lungs are clear. No pneumothorax or pleural effusion is noted. The visualized skeletal structures are unremarkable. IMPRESSION: No active cardiopulmonary disease. Electronically Signed   By: Lupita Raider M.D.   On: 09/08/2019 09:32   CT HEAD WO CONTRAST  Result Date: 09/08/2019 CLINICAL DATA:  History of neck trauma, presents from home status post fall. EXAM: CT HEAD WITHOUT CONTRAST CT CERVICAL SPINE WITHOUT CONTRAST TECHNIQUE: Multidetector CT imaging of the head and cervical spine was performed following the standard protocol without intravenous contrast. Multiplanar CT image reconstructions of the cervical spine were also generated. COMPARISON:  09/19/2017 FINDINGS: CT HEAD FINDINGS Brain: No evidence of acute infarction, hemorrhage,  hydrocephalus, extra-axial collection or mass lesion/mass effect. Signs of atrophy and microvascular ischemic change as before. Vascular: No hyperdense vessel or unexpected calcification. Skull: Normal. Negative for fracture or focal lesion. Sinuses/Orbits: Visualized paranasal sinuses and orbits are unremarkable. Other: None CT CERVICAL SPINE FINDINGS Alignment: Mild anterolisthesis of C3 on C4 approximately 2-3 mm unchanged since 2015. Skull base and vertebrae: No acute fracture. No primary bone lesion or focal pathologic process. Soft tissues and spinal canal: No prevertebral fluid or swelling. No visible canal hematoma. Disc levels: Multilevel degenerative change greatest at C4-5 and C5-6 as well C6-7 with uncovertebral degenerative changes and facet arthropathy disc space narrowing at these levels and degenerative changes worse than in 2015. Atlantoaxial degenerative changes also noted unchanged from previous exam. Upper chest: Biapical pleural and parenchymal scarring. Other: None IMPRESSION: 1. No evidence for acute traumatic intracranial injury. 2. No evidence for acute fracture or malalignment of the cervical spine. 3. Worsening degenerative change compared to prior imaging in the cervical spine. Electronically Signed   By: Donzetta Kohut M.D.   On: 09/08/2019 09:27   CT CERVICAL SPINE WO CONTRAST  Result Date: 09/08/2019 CLINICAL DATA:  History of neck trauma, presents from home status post fall. EXAM: CT HEAD WITHOUT CONTRAST CT CERVICAL SPINE WITHOUT CONTRAST TECHNIQUE: Multidetector CT imaging of the head and cervical spine was performed following the standard protocol without intravenous contrast. Multiplanar CT image reconstructions of the cervical spine were also generated. COMPARISON:  09/19/2017 FINDINGS: CT HEAD FINDINGS Brain: No evidence of acute infarction, hemorrhage, hydrocephalus, extra-axial collection or mass lesion/mass effect. Signs of atrophy and microvascular ischemic change as  before. Vascular: No hyperdense vessel or unexpected calcification. Skull: Normal. Negative for fracture or focal  lesion. Sinuses/Orbits: Visualized paranasal sinuses and orbits are unremarkable. Other: None CT CERVICAL SPINE FINDINGS Alignment: Mild anterolisthesis of C3 on C4 approximately 2-3 mm unchanged since 2015. Skull base and vertebrae: No acute fracture. No primary bone lesion or focal pathologic process. Soft tissues and spinal canal: No prevertebral fluid or swelling. No visible canal hematoma. Disc levels: Multilevel degenerative change greatest at C4-5 and C5-6 as well C6-7 with uncovertebral degenerative changes and facet arthropathy disc space narrowing at these levels and degenerative changes worse than in 2015. Atlantoaxial degenerative changes also noted unchanged from previous exam. Upper chest: Biapical pleural and parenchymal scarring. Other: None IMPRESSION: 1. No evidence for acute traumatic intracranial injury. 2. No evidence for acute fracture or malalignment of the cervical spine. 3. Worsening degenerative change compared to prior imaging in the cervical spine. Electronically Signed   By: Donzetta KohutGeoffrey  Wile M.D.   On: 09/08/2019 09:27   DG Hip Unilat W or Wo Pelvis 2-3 Views Right  Result Date: 09/08/2019 CLINICAL DATA:  Right hip pain after fall. EXAM: DG HIP (WITH OR WITHOUT PELVIS) 2-3V RIGHT COMPARISON:  2014 assessment FINDINGS: Signs of sacroplasty/vertebroplasty. Osteopenia. Post right femoral neck fracture ORIF with 3 lack type screws. Signs of left femoral nailing. No signs of fracture about the right proximal femur. Hardware appears engaged and intact. Irregularity again noted along the superior and inferior pubic ramus on the right appears little changed compared to exam of 2018 appears to reflect prior injury. IMPRESSION: 1. No signs of acute fracture or dislocation. 2. Osteopenia. 3. Post ORIF right femoral neck fracture. Hardware appears engaged and intact. Electronically  Signed   By: Donzetta KohutGeoffrey  Wile M.D.   On: 09/08/2019 09:37    ____________________________________________   PROCEDURES Procedures  ____________________________________________  DIFFERENTIAL DIAGNOSIS   Intracranial hemorrhage, C-spine fracture, UTI, dehydration, electrolyte abnormality, hip fracture, shoulder fracture, rib injury, pneumothorax.  CLINICAL IMPRESSION / ASSESSMENT AND PLAN / ED COURSE  Medications ordered in the ED: Medications  Tdap (BOOSTRIX) injection 0.5 mL (0.5 mLs Intramuscular Given 09/08/19 1025)    Pertinent labs & imaging results that were available during my care of the patient were reviewed by me and considered in my medical decision making (see chart for details).  Lamount Crankeratricia B Bergland was evaluated in Emergency Department on 09/08/2019 for the symptoms described in the history of present illness. She was evaluated in the context of the global COVID-19 pandemic, which necessitated consideration that the patient might be at risk for infection with the SARS-CoV-2 virus that causes COVID-19. Institutional protocols and algorithms that pertain to the evaluation of patients at risk for COVID-19 are in a state of rapid change based on information released by regulatory bodies including the CDC and federal and state organizations. These policies and algorithms were followed during the patient's care in the ED.   Patient presents after a fall at home.  She is nontoxic, not in distress, no obvious serious injury on exam.  Will obtain CT head and neck, x-ray of the chest left shoulder and right hip, check labs.   ----------------------------------------- 11:41 AM on 09/08/2019 -----------------------------------------  Work-up all unremarkable.  No evidence of fracture or intracranial hemorrhage C-spine injury or abnormal labs.     ____________________________________________   FINAL CLINICAL IMPRESSION(S) / ED DIAGNOSES    Final diagnoses:  Abrasion  Fall in  home, initial encounter  Dementia   ED Discharge Orders    None      Portions of this note were generated with dragon dictation  software. Dictation errors may occur despite best attempts at proofreading.   Carrie Mew, MD 09/08/19 1141

## 2019-09-08 NOTE — ED Triage Notes (Signed)
Pt ems from home s/p fall. Per husband, he found wife on floor in kitchen. Time of fall unknown. Pt c/o left shoulder and right hip pain with movement. Left posterior shoulder with abrasion. Pt poor historian.

## 2019-09-09 LAB — URINE CULTURE: Culture: NO GROWTH

## 2019-10-27 DIAGNOSIS — R252 Cramp and spasm: Secondary | ICD-10-CM | POA: Insufficient documentation

## 2019-10-27 DIAGNOSIS — M79604 Pain in right leg: Secondary | ICD-10-CM | POA: Insufficient documentation

## 2019-10-27 DIAGNOSIS — G2581 Restless legs syndrome: Secondary | ICD-10-CM | POA: Insufficient documentation

## 2019-10-27 DIAGNOSIS — G8929 Other chronic pain: Secondary | ICD-10-CM | POA: Insufficient documentation

## 2019-12-21 DIAGNOSIS — R413 Other amnesia: Secondary | ICD-10-CM | POA: Insufficient documentation

## 2019-12-21 DIAGNOSIS — R5383 Other fatigue: Secondary | ICD-10-CM | POA: Insufficient documentation

## 2020-01-24 ENCOUNTER — Other Ambulatory Visit: Payer: Self-pay | Admitting: Licensed Clinical Social Worker

## 2020-01-24 DIAGNOSIS — D509 Iron deficiency anemia, unspecified: Secondary | ICD-10-CM

## 2020-02-04 ENCOUNTER — Other Ambulatory Visit: Payer: Self-pay

## 2020-02-04 ENCOUNTER — Inpatient Hospital Stay: Payer: Medicare PPO

## 2020-02-04 ENCOUNTER — Encounter: Payer: Self-pay | Admitting: Internal Medicine

## 2020-02-04 ENCOUNTER — Inpatient Hospital Stay: Payer: Medicare PPO | Attending: Internal Medicine

## 2020-02-04 ENCOUNTER — Inpatient Hospital Stay (HOSPITAL_BASED_OUTPATIENT_CLINIC_OR_DEPARTMENT_OTHER): Payer: Medicare PPO | Admitting: Internal Medicine

## 2020-02-04 VITALS — BP 106/61 | HR 76 | Temp 96.3°F

## 2020-02-04 DIAGNOSIS — Z803 Family history of malignant neoplasm of breast: Secondary | ICD-10-CM | POA: Diagnosis not present

## 2020-02-04 DIAGNOSIS — R5383 Other fatigue: Secondary | ICD-10-CM | POA: Diagnosis not present

## 2020-02-04 DIAGNOSIS — Z881 Allergy status to other antibiotic agents status: Secondary | ICD-10-CM | POA: Insufficient documentation

## 2020-02-04 DIAGNOSIS — M1990 Unspecified osteoarthritis, unspecified site: Secondary | ICD-10-CM | POA: Diagnosis not present

## 2020-02-04 DIAGNOSIS — Z880 Allergy status to penicillin: Secondary | ICD-10-CM | POA: Insufficient documentation

## 2020-02-04 DIAGNOSIS — M549 Dorsalgia, unspecified: Secondary | ICD-10-CM | POA: Diagnosis not present

## 2020-02-04 DIAGNOSIS — D509 Iron deficiency anemia, unspecified: Secondary | ICD-10-CM

## 2020-02-04 DIAGNOSIS — Z801 Family history of malignant neoplasm of trachea, bronchus and lung: Secondary | ICD-10-CM | POA: Diagnosis not present

## 2020-02-04 DIAGNOSIS — Z79899 Other long term (current) drug therapy: Secondary | ICD-10-CM | POA: Diagnosis not present

## 2020-02-04 DIAGNOSIS — K922 Gastrointestinal hemorrhage, unspecified: Secondary | ICD-10-CM | POA: Insufficient documentation

## 2020-02-04 DIAGNOSIS — K909 Intestinal malabsorption, unspecified: Secondary | ICD-10-CM | POA: Diagnosis not present

## 2020-02-04 DIAGNOSIS — E538 Deficiency of other specified B group vitamins: Secondary | ICD-10-CM | POA: Diagnosis not present

## 2020-02-04 DIAGNOSIS — Z87442 Personal history of urinary calculi: Secondary | ICD-10-CM | POA: Diagnosis not present

## 2020-02-04 DIAGNOSIS — Z886 Allergy status to analgesic agent status: Secondary | ICD-10-CM | POA: Insufficient documentation

## 2020-02-04 DIAGNOSIS — Z87891 Personal history of nicotine dependence: Secondary | ICD-10-CM | POA: Diagnosis not present

## 2020-02-04 DIAGNOSIS — M2550 Pain in unspecified joint: Secondary | ICD-10-CM | POA: Diagnosis not present

## 2020-02-04 DIAGNOSIS — D500 Iron deficiency anemia secondary to blood loss (chronic): Secondary | ICD-10-CM | POA: Insufficient documentation

## 2020-02-04 LAB — COMPREHENSIVE METABOLIC PANEL
ALT: 27 U/L (ref 0–44)
AST: 27 U/L (ref 15–41)
Albumin: 3.6 g/dL (ref 3.5–5.0)
Alkaline Phosphatase: 123 U/L (ref 38–126)
Anion gap: 11 (ref 5–15)
BUN: 17 mg/dL (ref 8–23)
CO2: 29 mmol/L (ref 22–32)
Calcium: 8.9 mg/dL (ref 8.9–10.3)
Chloride: 99 mmol/L (ref 98–111)
Creatinine, Ser: 0.82 mg/dL (ref 0.44–1.00)
GFR calc Af Amer: >60 mL/min (ref >60)
GFR calc non Af Amer: >60 mL/min (ref >60)
Glucose, Bld: 120 mg/dL — ABNORMAL HIGH (ref 70–99)
Potassium: 4 mmol/L (ref 3.5–5.1)
Sodium: 139 mmol/L (ref 135–145)
Total Bilirubin: 0.3 mg/dL (ref 0.3–1.2)
Total Protein: 7.1 g/dL (ref 6.5–8.1)

## 2020-02-04 LAB — CBC WITH DIFFERENTIAL/PLATELET
Abs Immature Granulocytes: 0.01 10*3/uL (ref 0.00–0.07)
Basophils Absolute: 0.1 10*3/uL (ref 0.0–0.1)
Basophils Relative: 1 %
Eosinophils Absolute: 0.2 10*3/uL (ref 0.0–0.5)
Eosinophils Relative: 2 %
HCT: 30.3 % — ABNORMAL LOW (ref 36.0–46.0)
Hemoglobin: 9.2 g/dL — ABNORMAL LOW (ref 12.0–15.0)
Immature Granulocytes: 0 %
Lymphocytes Relative: 18 %
Lymphs Abs: 1.4 10*3/uL (ref 0.7–4.0)
MCH: 22.5 pg — ABNORMAL LOW (ref 26.0–34.0)
MCHC: 30.4 g/dL (ref 30.0–36.0)
MCV: 74.1 fL — ABNORMAL LOW (ref 80.0–100.0)
Monocytes Absolute: 0.6 10*3/uL (ref 0.1–1.0)
Monocytes Relative: 8 %
Neutro Abs: 5.6 10*3/uL (ref 1.7–7.7)
Neutrophils Relative %: 71 %
Platelets: 350 10*3/uL (ref 150–400)
RBC: 4.09 MIL/uL (ref 3.87–5.11)
RDW: 16.2 % — ABNORMAL HIGH (ref 11.5–15.5)
WBC: 7.8 10*3/uL (ref 4.0–10.5)
nRBC: 0 % (ref 0.0–0.2)

## 2020-02-04 LAB — IRON AND TIBC
Iron: 16 ug/dL — ABNORMAL LOW (ref 28–170)
Saturation Ratios: 3 % — ABNORMAL LOW (ref 10.4–31.8)
TIBC: 469 ug/dL — ABNORMAL HIGH (ref 250–450)
UIBC: 453 ug/dL

## 2020-02-04 LAB — FERRITIN: Ferritin: 6 ng/mL — ABNORMAL LOW (ref 11–307)

## 2020-02-04 MED ORDER — DIPHENHYDRAMINE HCL 50 MG/ML IJ SOLN
25.0000 mg | Freq: Once | INTRAMUSCULAR | Status: AC
  Administered 2020-02-04: 25 mg via INTRAVENOUS
  Filled 2020-02-04: qty 1

## 2020-02-04 MED ORDER — SODIUM CHLORIDE 0.9 % IV SOLN
Freq: Once | INTRAVENOUS | Status: AC
  Filled 2020-02-04: qty 250

## 2020-02-04 MED ORDER — SODIUM CHLORIDE 0.9 % IV SOLN
510.0000 mg | Freq: Once | INTRAVENOUS | Status: AC
  Administered 2020-02-04: 510 mg via INTRAVENOUS
  Filled 2020-02-04: qty 510

## 2020-02-04 NOTE — Progress Notes (Signed)
Per Dr. Donneta Romberg pt to receive Benadryl and Feraheme only today.   1545: Pt tolerated infusion well. No s/s of distress or reaction noted. Pt and VS stable at discharge.

## 2020-02-04 NOTE — Assessment & Plan Note (Addendum)
#   Anemia- multifactorial/iron deficiency-B12 deficiency secondary to previous GI surgeries. Hb 9.5; Proceed with IV ferrahem infusion today.   #  B12 deficiency- continue B12 shots every 3 months; Stable; plan next week.  # DISPOSITION: # Ferrahem infusion today # 1 week- B12 injectionFerrahem infusion # in 2 weeks- Ferrahem # follow up in 3 months-MD; cbc/bmp- B12 injection/ Possible ferrahem-Dr.B

## 2020-02-04 NOTE — Progress Notes (Signed)
[facility] OFFICE PROGRESS NOTE  Patient Care Team: Angela Coxasanayaka, Gayani Y, MD as PCP - General (Internal Medicine)   SUMMARY OF ONCOLOGIC HISTORY:  # ANEMIA- Multifactorial- Iron def/ B12 def [previous stomach surgeries/PUD]- IV iron last October 2016/  # B12  IM injections q 3 MONTHS [[facility]]  #Irregular heart rhythm-sinus tachycardia with PVCs/congestive heart failure [Dr. Fath]; nursing home  INTERVAL HISTORY:  A pleasant 79 year old female patient with above history of anemia likely secondary to malabsorption/question chronic GI blood loss- currently on IV iron/B12 shot is here for follow-up.    Patient has not followed up with us as she has been followed by PCP.  More recently noted to have worsening fatigue.  Noted to have worsening anemia hemoglobin around 9.  No blood in stools no blood per stools.  No nausea no vomiting.  Review of Systems  Constitutional: Positive for malaise/fatigue. Negative for chills, diaphoresis and fever.  HENT: Negative for nosebleeds and sore throat.   Eyes: Negative for double vision.  Respiratory: Negative for cough, hemoptysis, sputum production, shortness of breath and wheezing.   Cardiovascular: Negative for chest pain, palpitations, orthopnea and leg swelling.  Gastrointestinal: Negative for abdominal pain, blood in stool, constipation, diarrhea, heartburn, melena, nausea and vomiting.  Genitourinary: Negative for dysuria, frequency and urgency.  Musculoskeletal: Positive for back pain and joint pain.  Skin: Negative.  Negative for itching and rash.  Neurological: Negative for dizziness, tingling, focal weakness, weakness and headaches.  Endo/Heme/Allergies: Does not bruise/bleed easily.  Psychiatric/Behavioral: Negative for depression. The patient is not nervous/anxious and does not have insomnia.      PAST MEDICAL HISTORY :  Past Medical History:  Diagnosis Date  . Arthritis   . B12 deficiency   . CHF (congestive  heart failure) (HCC)   . Chronic systolic heart failure (HCC)   . Dementia (HCC)   . Depression   . Dysrhythmia    TACHYCARDIA  . Failure to thrive (0-17)   . GERD (gastroesophageal reflux disease)   . Glaucoma   . Headache    MIGRAINES (now approx 1x/month)  . History of kidney stones   . Hypercholesterolemia   . IDA (iron deficiency anemia)   . Migraine   . Mood disorder (HCC)   . Multiple gastric ulcers   . Osteoporosis   . Renal disorder    Multiple episodes of kidney stones.   . Wears dentures    full upper and lower    PAST SURGICAL HISTORY :   Past Surgical History:  Procedure Laterality Date  . ABDOMINAL SURGERY     Reconstructed stomach and 3 different surgeries.   . APPENDECTOMY    . BREAST SURGERY     cyst removal  . CATARACT EXTRACTION W/PHACO Right 06/05/2015   Procedure: CATARACT EXTRACTION PHACO AND INTRAOCULAR LENS PLACEMENT (IOC);  Surgeon: Lockie Molahadwick Brasington, MD;  Location: [facility] ORS;  Service: Ophthalmology;  Laterality: Right;  US 01:06   . CATARACT EXTRACTION W/PHACO Left 04/15/2018   Procedure: CATARACT EXTRACTION PHACO AND INTRAOCULAR LENS PLACEMENT (IOC) LEFT;  Surgeon: Lockie MolaBrasington, Chadwick, MD;  [facility];  Service: Ophthalmology;  Laterality: Left;  . CESAREAN SECTION     x 3  . CHOLECYSTECTOMY    . COLONOSCOPY    . ESOPHAGOGASTRODUODENOSCOPY    . INTRAMEDULLARY (IM) NAIL INTERTROCHANTERIC Left 12/29/2016   Procedure: INTRAMEDULLARY (IM) NAIL INTERTROCHANTRIC;  Surgeon: Kennedy BuckerMenz, Michael, MD;  Location: [facility] OR[facility];  Service: Orthopedics;  Laterality: Left;  . KYPHOPLASTY    .  KYPHOPLASTY    . LITHOTRIPSY     and stone extraction    FAMILY HISTORY :   Family History  Problem Relation Age of Onset  . Breast cancer Mother   . Lung cancer Father     SOCIAL HISTORY:   Social History   Tobacco Use  . Smoking status: Former Smoker    Types: Cigarettes    Quit date: 11/29/1993    Years since quitting: 26.2  . Smokeless  tobacco: Never Used  Vaping Use  . Vaping Use: Never used  Substance Use Topics  . Alcohol use: No    Alcohol/week: 1.0 standard drink    Types: 1 Standard drinks or equivalent per week  . Drug use: No    ALLERGIES:  is allergic to penicillins, aspirin, ferrous gluconate, iron polysaccharide, nsaids, other, sulfamethoxazole-trimethoprim, tolmetin, and venofer [iron sucrose].  MEDICATIONS:  Current Outpatient Medications  Medication Sig Dispense Refill  . acetaminophen (TYLENOL) 325 MG tablet Take 2 tablets (650 mg total) by mouth every 6 (six) hours as needed for mild pain (or Fever >/= 101).    . ARIPiprazole (ABILIFY) 10 MG tablet Take 10 mg by mouth daily.    Marland Kitchen atorvastatin (LIPITOR) 20 MG tablet Take 20 mg by mouth at bedtime.    . calcium-vitamin D (OSCAL WITH D) 500-200 MG-UNIT tablet Take 1 tablet by mouth daily with breakfast.    . DULoxetine (CYMBALTA) 30 MG capsule Take 30 mg by mouth daily.     . furosemide (LASIX) 40 MG tablet Take 40 mg by mouth every morning.    . gabapentin (NEURONTIN) 300 MG capsule Take 300 mg by mouth in the morning, at noon, and at bedtime.    Marland Kitchen latanoprost (XALATAN) 0.005 % ophthalmic solution Place 1 drop into both eyes at bedtime.     Marland Kitchen loperamide (IMODIUM A-D) 2 MG tablet Take 2 mg by mouth 3 (three) times daily as needed for diarrhea or loose stools.    . Multiple Vitamin (MULTIVITAMIN) tablet Take 1 tablet by mouth daily.    Marland Kitchen MYRBETRIQ 25 MG TB24 tablet Take 25 mg by mouth daily.    Ethelda Chick (OYSTER CALCIUM) 500 MG TABS tablet Take 500 mg of elemental calcium by mouth daily.    . pantoprazole (PROTONIX) 40 MG tablet Take 40 mg by mouth daily.     . polyethylene glycol (MIRALAX / GLYCOLAX) packet Take 17 g by mouth daily as needed.    . potassium chloride (K-DUR,KLOR-CON) 10 MEQ tablet Take 1 tablet by mouth daily.    Marland Kitchen rOPINIRole (REQUIP) 0.5 MG tablet Take 0.5 mg by mouth at bedtime.    . traMADol (ULTRAM) 50 MG tablet Take 50 mg by  mouth at bedtime as needed.    . traZODone (DESYREL) 50 MG tablet Take 25 mg by mouth at bedtime.    . vitamin B-12 (CYANOCOBALAMIN) 500 MCG tablet Take 500 mcg by mouth daily.    Marland Kitchen gabapentin (NEURONTIN) 100 MG capsule Take 100 mg by mouth at bedtime.    Marland Kitchen nystatin cream (MYCOSTATIN) Apply 1 application topically 2 (two) times daily as needed for dry skin.    Marland Kitchen ondansetron (ZOFRAN) 4 MG tablet Take 4 mg by mouth every 8 (eight) hours as needed for nausea or vomiting. (Patient not taking: Reported on 02/04/2020)    . SUMAtriptan (IMITREX) 100 MG tablet 100 mg every 2 (two) hours as needed.  (Patient not taking: Reported on 02/04/2020)     No current  facility-administered medications for this visit.    PHYSICAL EXAMINATION:  BP 105/69 (BP Location: Left Arm, Patient Position: Sitting, Cuff Size: Normal)   Pulse 89   Temp (!) 97.2 F (36.2 C) (Tympanic)   Wt 115 lb (52.2 kg)   BMI 21.03 kg/m   Filed Weights   02/04/20 1307  Weight: 115 lb (52.2 kg)    Physical Exam Constitutional:      Comments: Elderly frail-appearing female patient.  She is in a wheelchair.  Accompanied by husband.  HENT:     Head: Normocephalic and atraumatic.     Mouth/Throat:     Pharynx: No oropharyngeal exudate.  Eyes:     Pupils: Pupils are equal, round, and reactive to light.  Cardiovascular:     Rate and Rhythm: Normal rate and regular rhythm.  Pulmonary:     Effort: No respiratory distress.     Breath sounds: No wheezing.  Abdominal:     General: Bowel sounds are normal. There is no distension.     Palpations: Abdomen is soft. There is no mass.     Tenderness: There is no abdominal tenderness. There is no guarding or rebound.  Musculoskeletal:        General: No tenderness. Normal range of motion.     Cervical back: Normal range of motion and neck supple.  Skin:    General: Skin is warm.  Neurological:     Mental Status: She is alert and oriented to person, place, and time.  Psychiatric:         Mood and Affect: Affect normal.     LABORATORY DATA:  I have reviewed the data as listed    Component Value Date/Time   NA 139 02/04/2020 1250   NA 139 11/10/2014 0503   K 4.0 02/04/2020 1250   K 3.6 11/10/2014 0503   CL 99 02/04/2020 1250   CL 107 11/10/2014 0503   CO2 29 02/04/2020 1250   CO2 24 11/10/2014 0503   GLUCOSE 120 (H) 02/04/2020 1250   GLUCOSE 84 11/10/2014 0503   BUN 17 02/04/2020 1250   BUN 7 11/10/2014 0503   CREATININE 0.82 02/04/2020 1250   CREATININE 0.56 11/10/2014 0503   CALCIUM 8.9 02/04/2020 1250   CALCIUM 7.8 (L) 11/10/2014 0503   PROT 7.1 02/04/2020 1250   PROT 6.9 11/09/2014 1254   ALBUMIN 3.6 02/04/2020 1250   ALBUMIN 3.5 11/09/2014 1254   AST 27 02/04/2020 1250   AST 19 11/09/2014 1254   ALT 27 02/04/2020 1250   ALT 15 11/09/2014 1254   ALKPHOS 123 02/04/2020 1250   ALKPHOS 155 (H) 11/09/2014 1254   BILITOT 0.3 02/04/2020 1250   BILITOT 0.3 11/09/2014 1254   GFRNONAA >60 02/04/2020 1250   GFRNONAA >60 11/10/2014 0503   GFRAA >60 02/04/2020 1250   GFRAA >60 11/10/2014 0503    No results found for: SPEP, UPEP  Lab Results  Component Value Date   WBC 7.8 02/04/2020   NEUTROABS 5.6 02/04/2020   HGB 9.2 (L) 02/04/2020   HCT 30.3 (L) 02/04/2020   MCV 74.1 (L) 02/04/2020   PLT 350 02/04/2020      Chemistry      Component Value Date/Time   NA 139 02/04/2020 1250   NA 139 11/10/2014 0503   K 4.0 02/04/2020 1250   K 3.6 11/10/2014 0503   CL 99 02/04/2020 1250   CL 107 11/10/2014 0503   CO2 29 02/04/2020 1250   CO2 24 11/10/2014 0503  BUN 17 02/04/2020 1250   BUN 7 11/10/2014 0503   CREATININE 0.82 02/04/2020 1250   CREATININE 0.56 11/10/2014 0503      Component Value Date/Time   CALCIUM 8.9 02/04/2020 1250   CALCIUM 7.8 (L) 11/10/2014 0503   ALKPHOS 123 02/04/2020 1250   ALKPHOS 155 (H) 11/09/2014 1254   AST 27 02/04/2020 1250   AST 19 11/09/2014 1254   ALT 27 02/04/2020 1250   ALT 15 11/09/2014 1254   BILITOT 0.3  02/04/2020 1250   BILITOT 0.3 11/09/2014 1254       RADIOGRAPHIC STUDIES: I have personally reviewed the radiological images as listed and agreed with the findings in the report. No results found.   ASSESSMENT & PLAN:  Iron deficiency anemia due to chronic blood loss # Anemia- multifactorial/iron deficiency-B12 deficiency secondary to previous GI surgeries. Hb 9.5; Proceed with IV ferrahem infusion today.   #  B12 deficiency- continue B12 shots every 3 months; Stable; plan next week.  # DISPOSITION: # Ferrahem infusion today # 1 week- B12 injectionFerrahem infusion # in 2 weeks- Ferrahem # follow up in 3 months-MD; cbc/bmp- B12 injection/ Possible ferrahem-Dr.B       Earna Coder, MD 02/04/2020 5:00 PM

## 2020-02-10 ENCOUNTER — Other Ambulatory Visit: Payer: Self-pay

## 2020-02-10 ENCOUNTER — Other Ambulatory Visit: Payer: Self-pay | Admitting: *Deleted

## 2020-02-10 ENCOUNTER — Inpatient Hospital Stay: Payer: Medicare PPO

## 2020-02-10 ENCOUNTER — Other Ambulatory Visit: Payer: Self-pay | Admitting: Internal Medicine

## 2020-02-10 VITALS — BP 109/55 | HR 84 | Temp 98.2°F | Resp 16

## 2020-02-10 DIAGNOSIS — D500 Iron deficiency anemia secondary to blood loss (chronic): Secondary | ICD-10-CM | POA: Diagnosis not present

## 2020-02-10 DIAGNOSIS — D509 Iron deficiency anemia, unspecified: Secondary | ICD-10-CM

## 2020-02-10 DIAGNOSIS — E538 Deficiency of other specified B group vitamins: Secondary | ICD-10-CM

## 2020-02-10 DIAGNOSIS — D508 Other iron deficiency anemias: Secondary | ICD-10-CM

## 2020-02-10 LAB — CBC
HCT: 34.2 % — ABNORMAL LOW (ref 36.0–46.0)
Hemoglobin: 10.2 g/dL — ABNORMAL LOW (ref 12.0–15.0)
MCH: 22.8 pg — ABNORMAL LOW (ref 26.0–34.0)
MCHC: 29.8 g/dL — ABNORMAL LOW (ref 30.0–36.0)
MCV: 76.5 fL — ABNORMAL LOW (ref 80.0–100.0)
Platelets: 416 10*3/uL — ABNORMAL HIGH (ref 150–400)
RBC: 4.47 MIL/uL (ref 3.87–5.11)
RDW: 18.9 % — ABNORMAL HIGH (ref 11.5–15.5)
WBC: 11.2 10*3/uL — ABNORMAL HIGH (ref 4.0–10.5)
nRBC: 0 % (ref 0.0–0.2)

## 2020-02-10 MED ORDER — DIPHENHYDRAMINE HCL 50 MG/ML IJ SOLN
25.0000 mg | Freq: Once | INTRAMUSCULAR | Status: AC
  Administered 2020-02-10: 25 mg via INTRAVENOUS
  Filled 2020-02-10: qty 1

## 2020-02-10 MED ORDER — SODIUM CHLORIDE 0.9 % IV SOLN
Freq: Once | INTRAVENOUS | Status: AC
  Filled 2020-02-10: qty 250

## 2020-02-10 MED ORDER — CYANOCOBALAMIN 1000 MCG/ML IJ SOLN
1000.0000 ug | Freq: Once | INTRAMUSCULAR | Status: AC
  Administered 2020-02-10: 1000 ug via INTRAMUSCULAR
  Filled 2020-02-10: qty 1

## 2020-02-10 MED ORDER — SODIUM CHLORIDE 0.9 % IV SOLN
510.0000 mg | Freq: Once | INTRAVENOUS | Status: AC
  Administered 2020-02-10: 510 mg via INTRAVENOUS
  Filled 2020-02-10: qty 510

## 2020-02-18 ENCOUNTER — Inpatient Hospital Stay: Payer: Medicare PPO

## 2020-02-18 ENCOUNTER — Other Ambulatory Visit: Payer: Self-pay

## 2020-02-18 VITALS — BP 105/56 | HR 72 | Temp 96.9°F | Resp 16

## 2020-02-18 DIAGNOSIS — E538 Deficiency of other specified B group vitamins: Secondary | ICD-10-CM

## 2020-02-18 DIAGNOSIS — D508 Other iron deficiency anemias: Secondary | ICD-10-CM

## 2020-02-18 DIAGNOSIS — D500 Iron deficiency anemia secondary to blood loss (chronic): Secondary | ICD-10-CM | POA: Diagnosis not present

## 2020-02-18 MED ORDER — SODIUM CHLORIDE 0.9 % IV SOLN
Freq: Once | INTRAVENOUS | Status: AC
  Filled 2020-02-18: qty 250

## 2020-02-18 MED ORDER — SODIUM CHLORIDE 0.9 % IV SOLN
510.0000 mg | Freq: Once | INTRAVENOUS | Status: AC
  Administered 2020-02-18: 510 mg via INTRAVENOUS
  Filled 2020-02-18: qty 510

## 2020-02-18 MED ORDER — DIPHENHYDRAMINE HCL 50 MG/ML IJ SOLN
25.0000 mg | Freq: Once | INTRAMUSCULAR | Status: AC
  Administered 2020-02-18: 25 mg via INTRAVENOUS
  Filled 2020-02-18: qty 1

## 2020-05-02 DIAGNOSIS — G479 Sleep disorder, unspecified: Secondary | ICD-10-CM | POA: Insufficient documentation

## 2020-05-02 DIAGNOSIS — R410 Disorientation, unspecified: Secondary | ICD-10-CM | POA: Insufficient documentation

## 2020-05-05 ENCOUNTER — Other Ambulatory Visit: Payer: Self-pay

## 2020-05-05 ENCOUNTER — Inpatient Hospital Stay (HOSPITAL_BASED_OUTPATIENT_CLINIC_OR_DEPARTMENT_OTHER): Payer: Medicare PPO | Admitting: Internal Medicine

## 2020-05-05 ENCOUNTER — Inpatient Hospital Stay: Payer: Medicare PPO | Attending: Internal Medicine

## 2020-05-05 ENCOUNTER — Inpatient Hospital Stay: Payer: Medicare PPO

## 2020-05-05 ENCOUNTER — Encounter: Payer: Self-pay | Admitting: Internal Medicine

## 2020-05-05 DIAGNOSIS — R000 Tachycardia, unspecified: Secondary | ICD-10-CM | POA: Diagnosis not present

## 2020-05-05 DIAGNOSIS — R5383 Other fatigue: Secondary | ICD-10-CM | POA: Diagnosis not present

## 2020-05-05 DIAGNOSIS — Z87891 Personal history of nicotine dependence: Secondary | ICD-10-CM | POA: Insufficient documentation

## 2020-05-05 DIAGNOSIS — Z881 Allergy status to other antibiotic agents status: Secondary | ICD-10-CM | POA: Insufficient documentation

## 2020-05-05 DIAGNOSIS — G629 Polyneuropathy, unspecified: Secondary | ICD-10-CM | POA: Insufficient documentation

## 2020-05-05 DIAGNOSIS — M2550 Pain in unspecified joint: Secondary | ICD-10-CM | POA: Insufficient documentation

## 2020-05-05 DIAGNOSIS — Z886 Allergy status to analgesic agent status: Secondary | ICD-10-CM | POA: Insufficient documentation

## 2020-05-05 DIAGNOSIS — G2581 Restless legs syndrome: Secondary | ICD-10-CM | POA: Diagnosis not present

## 2020-05-05 DIAGNOSIS — M549 Dorsalgia, unspecified: Secondary | ICD-10-CM | POA: Diagnosis not present

## 2020-05-05 DIAGNOSIS — D500 Iron deficiency anemia secondary to blood loss (chronic): Secondary | ICD-10-CM

## 2020-05-05 DIAGNOSIS — Z803 Family history of malignant neoplasm of breast: Secondary | ICD-10-CM | POA: Diagnosis not present

## 2020-05-05 DIAGNOSIS — Z801 Family history of malignant neoplasm of trachea, bronchus and lung: Secondary | ICD-10-CM | POA: Insufficient documentation

## 2020-05-05 DIAGNOSIS — Z880 Allergy status to penicillin: Secondary | ICD-10-CM | POA: Diagnosis not present

## 2020-05-05 DIAGNOSIS — F0390 Unspecified dementia without behavioral disturbance: Secondary | ICD-10-CM | POA: Diagnosis not present

## 2020-05-05 DIAGNOSIS — Z9049 Acquired absence of other specified parts of digestive tract: Secondary | ICD-10-CM | POA: Diagnosis not present

## 2020-05-05 DIAGNOSIS — I5022 Chronic systolic (congestive) heart failure: Secondary | ICD-10-CM | POA: Insufficient documentation

## 2020-05-05 DIAGNOSIS — Z79899 Other long term (current) drug therapy: Secondary | ICD-10-CM | POA: Diagnosis not present

## 2020-05-05 DIAGNOSIS — M1990 Unspecified osteoarthritis, unspecified site: Secondary | ICD-10-CM | POA: Insufficient documentation

## 2020-05-05 DIAGNOSIS — E538 Deficiency of other specified B group vitamins: Secondary | ICD-10-CM

## 2020-05-05 DIAGNOSIS — D508 Other iron deficiency anemias: Secondary | ICD-10-CM

## 2020-05-05 DIAGNOSIS — Z87442 Personal history of urinary calculi: Secondary | ICD-10-CM | POA: Insufficient documentation

## 2020-05-05 DIAGNOSIS — K922 Gastrointestinal hemorrhage, unspecified: Secondary | ICD-10-CM | POA: Insufficient documentation

## 2020-05-05 LAB — CBC WITH DIFFERENTIAL/PLATELET
Abs Immature Granulocytes: 0.04 10*3/uL (ref 0.00–0.07)
Basophils Absolute: 0.1 10*3/uL (ref 0.0–0.1)
Basophils Relative: 1 %
Eosinophils Absolute: 0.2 10*3/uL (ref 0.0–0.5)
Eosinophils Relative: 2 %
HCT: 43.5 % (ref 36.0–46.0)
Hemoglobin: 14.3 g/dL (ref 12.0–15.0)
Immature Granulocytes: 0 %
Lymphocytes Relative: 20 %
Lymphs Abs: 2.1 10*3/uL (ref 0.7–4.0)
MCH: 30.1 pg (ref 26.0–34.0)
MCHC: 32.9 g/dL (ref 30.0–36.0)
MCV: 91.6 fL (ref 80.0–100.0)
Monocytes Absolute: 0.7 10*3/uL (ref 0.1–1.0)
Monocytes Relative: 7 %
Neutro Abs: 7.2 10*3/uL (ref 1.7–7.7)
Neutrophils Relative %: 70 %
Platelets: 237 10*3/uL (ref 150–400)
RBC: 4.75 MIL/uL (ref 3.87–5.11)
RDW: 20 % — ABNORMAL HIGH (ref 11.5–15.5)
WBC: 10.3 10*3/uL (ref 4.0–10.5)
nRBC: 0 % (ref 0.0–0.2)

## 2020-05-05 MED ORDER — CYANOCOBALAMIN 1000 MCG/ML IJ SOLN
1000.0000 ug | Freq: Once | INTRAMUSCULAR | Status: AC
  Administered 2020-05-05: 1000 ug via INTRAMUSCULAR
  Filled 2020-05-05: qty 1

## 2020-05-05 NOTE — Progress Notes (Signed)
[facility] OFFICE PROGRESS NOTE  Patient Care Team: Angela Cox, MD as PCP - General (Internal Medicine) Earna Coder, MD as Consulting Physician (Hematology and Oncology)   SUMMARY OF ONCOLOGIC HISTORY:  # ANEMIA- Multifactorial- Iron def/ B12 def [previous stomach surgeries/PUD]- IV i Feraheme./  # B12  IM injections q 3 MONTHS [[facility]]  #Irregular heart rhythm-sinus tachycardia with PVCs/congestive heart failure [Dr. Fath]; nursing home  INTERVAL HISTORY:  A pleasant 79 year old female patient with above history of anemia likely secondary to malabsorption/question chronic GI blood loss- currently on IV iron/B12 shot is here for follow-up.    Patient has been recently evaluated by neurology for restless leg/neuropathy.  She is also being worked up for dementia.  No blood in stools no black or stools.  Energy levels overall improved.  Review of Systems  Constitutional: Positive for malaise/fatigue. Negative for chills, diaphoresis and fever.  HENT: Negative for nosebleeds and sore throat.   Eyes: Negative for double vision.  Respiratory: Negative for cough, hemoptysis, sputum production, shortness of breath and wheezing.   Cardiovascular: Negative for chest pain, palpitations, orthopnea and leg swelling.  Gastrointestinal: Negative for abdominal pain, blood in stool, constipation, diarrhea, heartburn, melena, nausea and vomiting.  Genitourinary: Negative for dysuria, frequency and urgency.  Musculoskeletal: Positive for back pain and joint pain.  Skin: Negative.  Negative for itching and rash.  Neurological: Negative for dizziness, tingling, focal weakness, weakness and headaches.  Endo/Heme/Allergies: Does not bruise/bleed easily.  Psychiatric/Behavioral: Negative for depression. The patient is not nervous/anxious and does not have insomnia.      PAST MEDICAL HISTORY :  Past Medical History:  Diagnosis Date  . Arthritis   . B12 deficiency    . CHF (congestive heart failure) (HCC)   . Chronic systolic heart failure (HCC)   . Dementia (HCC)   . Depression   . Dysrhythmia    TACHYCARDIA  . Failure to thrive (0-17)   . GERD (gastroesophageal reflux disease)   . Glaucoma   . Headache    MIGRAINES (now approx 1x/month)  . History of kidney stones   . Hypercholesterolemia   . IDA (iron deficiency anemia)   . Migraine   . Mood disorder (HCC)   . Multiple gastric ulcers   . Osteoporosis   . Renal disorder    Multiple episodes of kidney stones.   . Wears dentures    full upper and lower    PAST SURGICAL HISTORY :   Past Surgical History:  Procedure Laterality Date  . ABDOMINAL SURGERY     Reconstructed stomach and 3 different surgeries.   . APPENDECTOMY    . BREAST SURGERY     cyst removal  . CATARACT EXTRACTION W/PHACO Right 06/05/2015   Procedure: CATARACT EXTRACTION PHACO AND INTRAOCULAR LENS PLACEMENT (IOC);  Surgeon: Lockie Mola, MD;  Location: [facility] OR[facility];  Service: Ophthalmology;  Laterality: Right;  [place] 01:06   . CATARACT EXTRACTION W/PHACO Left 04/15/2018   Procedure: CATARACT EXTRACTION PHACO AND INTRAOCULAR LENS PLACEMENT (IOC) LEFT;  Surgeon: Lockie Mola, MD;  Location: [facility];  Service: Ophthalmology;  Laterality: Left;  . CESAREAN SECTION     x 3  . CHOLECYSTECTOMY    . COLONOSCOPY    . ESOPHAGOGASTRODUODENOSCOPY    . INTRAMEDULLARY (IM) NAIL INTERTROCHANTERIC Left 12/29/2016   Procedure: INTRAMEDULLARY (IM) NAIL INTERTROCHANTRIC;  Surgeon: Kennedy Bucker, MD;  Location: [facility] OR[facility];  Service: Orthopedics;  Laterality: Left;  . KYPHOPLASTY    . KYPHOPLASTY    .  LITHOTRIPSY     and stone extraction    FAMILY HISTORY :   Family History  Problem Relation Age of Onset  . Breast cancer Mother   . Lung cancer Father     SOCIAL HISTORY:   Social History   Tobacco Use  . Smoking status: Former Smoker    Types: Cigarettes    Quit date: 11/29/1993    Years since quitting: 26.4   . Smokeless tobacco: Never Used  Vaping Use  . Vaping Use: Never used  Substance Use Topics  . Alcohol use: No    Alcohol/week: 1.0 standard drink    Types: 1 Standard drinks or equivalent per week  . Drug use: No    ALLERGIES:  is allergic to penicillins, aspirin, ferrous gluconate, iron polysaccharide, nsaids, other, sulfamethoxazole-trimethoprim, tolmetin, and venofer [iron sucrose].  MEDICATIONS:  Current Outpatient Medications  Medication Sig Dispense Refill  . acetaminophen (TYLENOL) 325 MG tablet Take 2 tablets (650 mg total) by mouth every 6 (six) hours as needed for mild pain (or Fever >/= 101).    . ARIPiprazole (ABILIFY) 5 MG tablet Take 5 mg by mouth daily.     Marland Kitchen atorvastatin (LIPITOR) 20 MG tablet Take 20 mg by mouth at bedtime.    . calcium-vitamin D (OSCAL WITH D) 500-200 MG-UNIT tablet Take 1 tablet by mouth daily with breakfast.    . DULoxetine (CYMBALTA) 30 MG capsule Take 30 mg by mouth daily.     . furosemide (LASIX) 40 MG tablet Take 40 mg by mouth every morning.    . gabapentin (NEURONTIN) 300 MG capsule Take 300 mg by mouth in the morning, at noon, and at bedtime.    . Multiple Vitamin (MULTIVITAMIN) tablet Take 1 tablet by mouth daily.    Marland Kitchen MYRBETRIQ 25 MG TB24 tablet Take 25 mg by mouth daily.    Marland Kitchen nystatin cream (MYCOSTATIN) Apply 1 application topically 2 (two) times daily as needed for dry skin.    Ethelda Chick (OYSTER CALCIUM) 500 MG TABS tablet Take 500 mg of elemental calcium by mouth daily.    . pantoprazole (PROTONIX) 40 MG tablet Take 40 mg by mouth daily.     . polyethylene glycol (MIRALAX / GLYCOLAX) packet Take 17 g by mouth daily as needed.    . potassium chloride (K-DUR,KLOR-CON) 10 MEQ tablet Take 1 tablet by mouth daily.    . SUMAtriptan (IMITREX) 100 MG tablet 100 mg every 2 (two) hours as needed.     . traMADol (ULTRAM) 50 MG tablet Take 50 mg by mouth at bedtime as needed.    . traZODone (DESYREL) 50 MG tablet Take 25 mg by mouth at  bedtime.    . vitamin B-12 (CYANOCOBALAMIN) 500 MCG tablet Take 500 mcg by mouth daily.    Marland Kitchen latanoprost (XALATAN) 0.005 % ophthalmic solution Place 1 drop into both eyes at bedtime.  (Patient not taking: Reported on 05/05/2020)    . loperamide (IMODIUM A-D) 2 MG tablet Take 2 mg by mouth 3 (three) times daily as needed for diarrhea or loose stools. (Patient not taking: Reported on 05/05/2020)    . ondansetron (ZOFRAN) 4 MG tablet Take 4 mg by mouth every 8 (eight) hours as needed for nausea or vomiting. (Patient not taking: Reported on 02/04/2020)     No current facility-administered medications for this visit.    PHYSICAL EXAMINATION:  BP (!) 104/59   Pulse 81   Temp 98.2 F (36.8 C) (Tympanic)   Resp 20  Ht 4\' 11"  (1.499 m)   Wt 112 lb 9.6 oz (51.1 kg)   BMI 22.74 kg/m   Filed Weights   05/05/20 1328  Weight: 112 lb 9.6 oz (51.1 kg)    Physical Exam Constitutional:      Comments: Elderly frail-appearing female patient.  She is in a wheelchair.  Accompanied by husband.  HENT:     Head: Normocephalic and atraumatic.     Mouth/Throat:     Pharynx: No oropharyngeal exudate.  Eyes:     Pupils: Pupils are equal, round, and reactive to light.  Cardiovascular:     Rate and Rhythm: Normal rate and regular rhythm.  Pulmonary:     Effort: No respiratory distress.     Breath sounds: No wheezing.  Abdominal:     General: Bowel sounds are normal. There is no distension.     Palpations: Abdomen is soft. There is no mass.     Tenderness: There is no abdominal tenderness. There is no guarding or rebound.  Musculoskeletal:        General: No tenderness. Normal range of motion.     Cervical back: Normal range of motion and neck supple.  Skin:    General: Skin is warm.  Neurological:     Mental Status: She is alert and oriented to person, place, and time.  Psychiatric:        Mood and Affect: Affect normal.     LABORATORY DATA:  I have reviewed the data as listed    Component  Value Date/Time   NA 139 02/04/2020 1250   NA 139 11/10/2014 0503   K 4.0 02/04/2020 1250   K 3.6 11/10/2014 0503   CL 99 02/04/2020 1250   CL 107 11/10/2014 0503   CO2 29 02/04/2020 1250   CO2 24 11/10/2014 0503   GLUCOSE 120 (H) 02/04/2020 1250   GLUCOSE 84 11/10/2014 0503   BUN 17 02/04/2020 1250   BUN 7 11/10/2014 0503   CREATININE 0.82 02/04/2020 1250   CREATININE 0.56 11/10/2014 0503   CALCIUM 8.9 02/04/2020 1250   CALCIUM 7.8 (L) 11/10/2014 0503   PROT 7.1 02/04/2020 1250   PROT 6.9 11/09/2014 1254   ALBUMIN 3.6 02/04/2020 1250   ALBUMIN 3.5 11/09/2014 1254   AST 27 02/04/2020 1250   AST 19 11/09/2014 1254   ALT 27 02/04/2020 1250   ALT 15 11/09/2014 1254   ALKPHOS 123 02/04/2020 1250   ALKPHOS 155 (H) 11/09/2014 1254   BILITOT 0.3 02/04/2020 1250   BILITOT 0.3 11/09/2014 1254   GFRNONAA >60 02/04/2020 1250   GFRNONAA >60 11/10/2014 0503   GFRAA >60 02/04/2020 1250   GFRAA >60 11/10/2014 0503    No results found for: SPEP, UPEP  Lab Results  Component Value Date   WBC 10.3 05/05/2020   NEUTROABS 7.2 05/05/2020   HGB 14.3 05/05/2020   HCT 43.5 05/05/2020   MCV 91.6 05/05/2020   PLT 237 05/05/2020      Chemistry      Component Value Date/Time   NA 139 02/04/2020 1250   NA 139 11/10/2014 0503   K 4.0 02/04/2020 1250   K 3.6 11/10/2014 0503   CL 99 02/04/2020 1250   CL 107 11/10/2014 0503   CO2 29 02/04/2020 1250   CO2 24 11/10/2014 0503   BUN 17 02/04/2020 1250   BUN 7 11/10/2014 0503   CREATININE 0.82 02/04/2020 1250   CREATININE 0.56 11/10/2014 0503      Component Value Date/Time  CALCIUM 8.9 02/04/2020 1250   CALCIUM 7.8 (L) 11/10/2014 0503   ALKPHOS 123 02/04/2020 1250   ALKPHOS 155 (H) 11/09/2014 1254   AST 27 02/04/2020 1250   AST 19 11/09/2014 1254   ALT 27 02/04/2020 1250   ALT 15 11/09/2014 1254   BILITOT 0.3 02/04/2020 1250   BILITOT 0.3 11/09/2014 1254       RADIOGRAPHIC STUDIES: I have personally reviewed the radiological  images as listed and agreed with the findings in the report. No results found.   ASSESSMENT & PLAN:  Iron deficiency anemia due to chronic blood loss # Anemia- multifactorial/iron deficiency-B12 deficiency secondary to previous GI surgeries.s/p IV ferrahem-x2 [july2021] Hb-14 today; HOLD IV ferrahem infusion today.   #  B12 deficiency- continue B12 shots every 3-4 months; STABLE; proceed with B12 injection today.  Continue p.o. B12.  # DISPOSITION: # NO Ferrahem infusion today;B 12 injection today.  # follow up in 4 months-MD; cbc/bmp/iron studies; ferritin- B12 injection/ Possible ferrahem-Dr.B       Earna CoderGovinda R Naftali Carchi, MD 05/05/2020 1:57 PM

## 2020-05-05 NOTE — Assessment & Plan Note (Addendum)
#   Anemia- multifactorial/iron deficiency-B12 deficiency secondary to previous GI surgeries.s/p IV ferrahem-x2 [july2021] Hb-14 today; HOLD IV ferrahem infusion today.   #  B12 deficiency- continue B12 shots every 3-4 months; STABLE; proceed with B12 injection today.  Continue p.o. B12.  # DISPOSITION: # NO Ferrahem infusion today;B 12 injection today.  # follow up in 4 months-MD; cbc/bmp/iron studies; ferritin- B12 injection/ Possible ferrahem-Dr.B

## 2020-09-07 ENCOUNTER — Other Ambulatory Visit: Payer: Self-pay | Admitting: *Deleted

## 2020-09-07 DIAGNOSIS — D508 Other iron deficiency anemias: Secondary | ICD-10-CM

## 2020-09-07 DIAGNOSIS — E538 Deficiency of other specified B group vitamins: Secondary | ICD-10-CM

## 2020-09-08 ENCOUNTER — Encounter: Payer: Self-pay | Admitting: Internal Medicine

## 2020-09-08 ENCOUNTER — Inpatient Hospital Stay: Payer: Medicare PPO | Attending: Internal Medicine

## 2020-09-08 ENCOUNTER — Inpatient Hospital Stay (HOSPITAL_BASED_OUTPATIENT_CLINIC_OR_DEPARTMENT_OTHER): Payer: Medicare PPO | Admitting: Internal Medicine

## 2020-09-08 ENCOUNTER — Inpatient Hospital Stay: Payer: Medicare PPO

## 2020-09-08 DIAGNOSIS — E538 Deficiency of other specified B group vitamins: Secondary | ICD-10-CM | POA: Diagnosis not present

## 2020-09-08 DIAGNOSIS — Z87891 Personal history of nicotine dependence: Secondary | ICD-10-CM | POA: Insufficient documentation

## 2020-09-08 DIAGNOSIS — Z886 Allergy status to analgesic agent status: Secondary | ICD-10-CM | POA: Insufficient documentation

## 2020-09-08 DIAGNOSIS — Z880 Allergy status to penicillin: Secondary | ICD-10-CM | POA: Insufficient documentation

## 2020-09-08 DIAGNOSIS — M2550 Pain in unspecified joint: Secondary | ICD-10-CM | POA: Insufficient documentation

## 2020-09-08 DIAGNOSIS — K909 Intestinal malabsorption, unspecified: Secondary | ICD-10-CM | POA: Diagnosis not present

## 2020-09-08 DIAGNOSIS — D508 Other iron deficiency anemias: Secondary | ICD-10-CM

## 2020-09-08 DIAGNOSIS — R000 Tachycardia, unspecified: Secondary | ICD-10-CM | POA: Insufficient documentation

## 2020-09-08 DIAGNOSIS — Z803 Family history of malignant neoplasm of breast: Secondary | ICD-10-CM | POA: Diagnosis not present

## 2020-09-08 DIAGNOSIS — Z881 Allergy status to other antibiotic agents status: Secondary | ICD-10-CM | POA: Diagnosis not present

## 2020-09-08 DIAGNOSIS — Z87442 Personal history of urinary calculi: Secondary | ICD-10-CM | POA: Diagnosis not present

## 2020-09-08 DIAGNOSIS — D500 Iron deficiency anemia secondary to blood loss (chronic): Secondary | ICD-10-CM

## 2020-09-08 DIAGNOSIS — F0390 Unspecified dementia without behavioral disturbance: Secondary | ICD-10-CM | POA: Diagnosis not present

## 2020-09-08 DIAGNOSIS — Z9049 Acquired absence of other specified parts of digestive tract: Secondary | ICD-10-CM | POA: Insufficient documentation

## 2020-09-08 DIAGNOSIS — M1990 Unspecified osteoarthritis, unspecified site: Secondary | ICD-10-CM | POA: Insufficient documentation

## 2020-09-08 DIAGNOSIS — Z801 Family history of malignant neoplasm of trachea, bronchus and lung: Secondary | ICD-10-CM | POA: Insufficient documentation

## 2020-09-08 DIAGNOSIS — Z79899 Other long term (current) drug therapy: Secondary | ICD-10-CM | POA: Diagnosis not present

## 2020-09-08 DIAGNOSIS — M549 Dorsalgia, unspecified: Secondary | ICD-10-CM | POA: Diagnosis not present

## 2020-09-08 DIAGNOSIS — I509 Heart failure, unspecified: Secondary | ICD-10-CM | POA: Diagnosis not present

## 2020-09-08 LAB — IRON AND TIBC
Iron: 52 ug/dL (ref 28–170)
Saturation Ratios: 15 % (ref 10.4–31.8)
TIBC: 343 ug/dL (ref 250–450)
UIBC: 291 ug/dL

## 2020-09-08 LAB — CBC WITH DIFFERENTIAL/PLATELET
Abs Immature Granulocytes: 0.03 10*3/uL (ref 0.00–0.07)
Basophils Absolute: 0.1 10*3/uL (ref 0.0–0.1)
Basophils Relative: 1 %
Eosinophils Absolute: 0.2 10*3/uL (ref 0.0–0.5)
Eosinophils Relative: 2 %
HCT: 43.8 % (ref 36.0–46.0)
Hemoglobin: 14.3 g/dL (ref 12.0–15.0)
Immature Granulocytes: 0 %
Lymphocytes Relative: 14 %
Lymphs Abs: 1.8 10*3/uL (ref 0.7–4.0)
MCH: 31.6 pg (ref 26.0–34.0)
MCHC: 32.6 g/dL (ref 30.0–36.0)
MCV: 96.7 fL (ref 80.0–100.0)
Monocytes Absolute: 0.7 10*3/uL (ref 0.1–1.0)
Monocytes Relative: 6 %
Neutro Abs: 9.6 10*3/uL — ABNORMAL HIGH (ref 1.7–7.7)
Neutrophils Relative %: 77 %
Platelets: 232 10*3/uL (ref 150–400)
RBC: 4.53 MIL/uL (ref 3.87–5.11)
RDW: 12.8 % (ref 11.5–15.5)
WBC: 12.3 10*3/uL — ABNORMAL HIGH (ref 4.0–10.5)
nRBC: 0 % (ref 0.0–0.2)

## 2020-09-08 LAB — BASIC METABOLIC PANEL
Anion gap: 11 (ref 5–15)
BUN: 19 mg/dL (ref 8–23)
CO2: 28 mmol/L (ref 22–32)
Calcium: 9.4 mg/dL (ref 8.9–10.3)
Chloride: 100 mmol/L (ref 98–111)
Creatinine, Ser: 0.68 mg/dL (ref 0.44–1.00)
GFR, Estimated: >60 mL/min (ref >60)
Glucose, Bld: 110 mg/dL — ABNORMAL HIGH (ref 70–99)
Potassium: 4 mmol/L (ref 3.5–5.1)
Sodium: 139 mmol/L (ref 135–145)

## 2020-09-08 LAB — FERRITIN: Ferritin: 114 ng/mL (ref 11–307)

## 2020-09-08 MED ORDER — CYANOCOBALAMIN 1000 MCG/ML IJ SOLN
1000.0000 ug | Freq: Once | INTRAMUSCULAR | Status: AC
  Administered 2020-09-08: 1000 ug via INTRAMUSCULAR
  Filled 2020-09-08: qty 1

## 2020-09-08 NOTE — Assessment & Plan Note (Signed)
#   Anemia- multifactorial/iron deficiency-B12 deficiency secondary to previous GI surgeries.s/p IV ferrahem-x2 [july2021] Hb-14 today;STABLE HOLD IV ferrahem infusion today.   #  B12 deficiency- continue B12 shots every 3-4 months;STABLE; ; proceed with B12 injection today.  Continue p.o. B12.  # DISPOSITION: # NO Ferrahem infusion today;B 12 injection today.  # follow up in 4 months-MD; cbc/bmp/iron studies; ferritin- B12 injection/ Possible ferrahem-Dr.B

## 2020-09-08 NOTE — Progress Notes (Signed)
[facility] OFFICE PROGRESS NOTE  Patient Care Team: Angela Cox, MD as PCP - General (Internal Medicine) Ashley Coder, MD as Consulting Physician (Hematology and Oncology)   SUMMARY OF ONCOLOGIC HISTORY:  # ANEMIA- Multifactorial- Iron def/ B12 def [previous stomach surgeries/PUD]- IV i Feraheme./  # B12  IM injections q 3 MONTHS [[facility]]  #Irregular heart rhythm-sinus tachycardia with PVCs/congestive heart failure [Dr. Fath]; nursing home  INTERVAL HISTORY:  [name] with above history of anemia likely secondary to malabsorption/question chronic GI blood loss- currently on IV iron/B12 shot is here for follow-up.    Patient has not had any hospitalizations.  Patient denies any new shortness of breath or cough.  Denies any blood in stools or black or stools.  Energy levels are adequate.  Review of Systems  Constitutional: Positive for malaise/fatigue. Negative for chills, diaphoresis and fever.  HENT: Negative for nosebleeds and sore throat.   Eyes: Negative for double vision.  Respiratory: Negative for cough, hemoptysis, sputum production, shortness of breath and wheezing.   Cardiovascular: Negative for chest pain, palpitations, orthopnea and leg swelling.  Gastrointestinal: Negative for abdominal pain, blood in stool, constipation, diarrhea, heartburn, melena, nausea and vomiting.  Genitourinary: Negative for dysuria, frequency and urgency.  Musculoskeletal: Positive for back pain and joint pain.  Skin: Negative.  Negative for itching and rash.  Neurological: Negative for dizziness, tingling, focal weakness, weakness and headaches.  Endo/Heme/Allergies: Does not bruise/bleed easily.  Psychiatric/Behavioral: Negative for depression. The patient is not nervous/anxious and does not have insomnia.      PAST MEDICAL HISTORY :  Past Medical History:  Diagnosis Date  . Arthritis   . B12 deficiency   . CHF (congestive  heart failure) (HCC)   . Chronic systolic heart failure (HCC)   . Dementia (HCC)   . Depression   . Dysrhythmia    TACHYCARDIA  . Failure to thrive (0-17)   . GERD (gastroesophageal reflux disease)   . Glaucoma   . Headache    MIGRAINES (now approx 1x/month)  . History of kidney stones   . Hypercholesterolemia   . IDA (iron deficiency anemia)   . Migraine   . Mood disorder (HCC)   . Multiple gastric ulcers   . Osteoporosis   . Renal disorder    Multiple episodes of kidney stones.   . Wears dentures    full upper and lower    PAST SURGICAL HISTORY :   Past Surgical History:  Procedure Laterality Date  . ABDOMINAL SURGERY     Reconstructed stomach and 3 different surgeries.   . APPENDECTOMY    . BREAST SURGERY     cyst removal  . CATARACT EXTRACTION W/PHACO Right 06/05/2015   Procedure: CATARACT EXTRACTION PHACO AND INTRAOCULAR LENS PLACEMENT (IOC);  Surgeon: Lockie Mola, MD;  Location: [facility];  Service: Ophthalmology;  Laterality: Right;  [place] 01:06   . CATARACT EXTRACTION W/PHACO Left 04/15/2018   Procedure: CATARACT EXTRACTION PHACO AND INTRAOCULAR LENS PLACEMENT (IOC) LEFT;  Surgeon: Lockie Mola, MD;  Location: [facility];  Service: Ophthalmology;  Laterality: Left;  . CESAREAN SECTION     x 3  . CHOLECYSTECTOMY    . COLONOSCOPY    . ESOPHAGOGASTRODUODENOSCOPY    . INTRAMEDULLARY (IM) NAIL INTERTROCHANTERIC Left 12/29/2016   Procedure: INTRAMEDULLARY (IM) NAIL INTERTROCHANTRIC;  Surgeon: Kennedy Bucker, MD;  Location: [facility];  Service: Orthopedics;  Laterality: Left;  . KYPHOPLASTY    . KYPHOPLASTY    .  LITHOTRIPSY     and stone extraction    FAMILY HISTORY :   Family History  Problem Relation Age of Onset  . Breast cancer Mother   . Lung cancer Father     SOCIAL HISTORY:   Social History   Tobacco Use  . Smoking status: Former Smoker    Types: Cigarettes    Quit date: 11/29/1993    Years since quitting: 26.7  . Smokeless  tobacco: Never Used  Vaping Use  . Vaping Use: Never used  Substance Use Topics  . Alcohol use: No    Alcohol/week: 1.0 standard drink    Types: 1 Standard drinks or equivalent per week  . Drug use: No    ALLERGIES:  is allergic to penicillins, aspirin, ferrous gluconate, iron polysaccharide, nsaids, other, sulfamethoxazole-trimethoprim, tolmetin, and venofer [iron sucrose].  MEDICATIONS:  Current Outpatient Medications  Medication Sig Dispense Refill  . acetaminophen (TYLENOL) 325 MG tablet Take 2 tablets (650 mg total) by mouth every 6 (six) hours as needed for mild pain (or Fever >/= 101).    . ARIPiprazole (ABILIFY) 5 MG tablet Take 5 mg by mouth daily.     Marland Kitchen atorvastatin (LIPITOR) 20 MG tablet Take 20 mg by mouth at bedtime.    . calcium-vitamin D (OSCAL WITH D) 500-200 MG-UNIT tablet Take 1 tablet by mouth daily with breakfast.    . DULoxetine (CYMBALTA) 30 MG capsule Take 30 mg by mouth daily.     . furosemide (LASIX) 40 MG tablet Take 40 mg by mouth every morning.    . gabapentin (NEURONTIN) 300 MG capsule Take 300 mg by mouth in the morning, at noon, and at bedtime.    . Multiple Vitamin (MULTIVITAMIN) tablet Take 1 tablet by mouth daily.    Marland Kitchen MYRBETRIQ 25 MG TB24 tablet Take 25 mg by mouth daily.    . ondansetron (ZOFRAN) 4 MG tablet Take 4 mg by mouth every 8 (eight) hours as needed for nausea or vomiting.    Ethelda Chick (OYSTER CALCIUM) 500 MG TABS tablet Take 500 mg of elemental calcium by mouth daily.    . pantoprazole (PROTONIX) 40 MG tablet Take 40 mg by mouth daily.     . polyethylene glycol (MIRALAX / GLYCOLAX) packet Take 17 g by mouth daily as needed.    . potassium chloride (K-DUR,KLOR-CON) 10 MEQ tablet Take 1 tablet by mouth daily.    . SUMAtriptan (IMITREX) 100 MG tablet 100 mg every 2 (two) hours as needed.     . traMADol (ULTRAM) 50 MG tablet Take 50 mg by mouth at bedtime as needed.    . traZODone (DESYREL) 50 MG tablet Take 25 mg by mouth at bedtime.     . vitamin B-12 (CYANOCOBALAMIN) 500 MCG tablet Take 500 mcg by mouth daily.    Marland Kitchen latanoprost (XALATAN) 0.005 % ophthalmic solution Place 1 drop into both eyes at bedtime.  (Patient not taking: No sig reported)    . loperamide (IMODIUM A-D) 2 MG tablet Take 2 mg by mouth 3 (three) times daily as needed for diarrhea or loose stools. (Patient not taking: No sig reported)     No current facility-administered medications for this visit.   Facility-Administered Medications Ordered in Other Visits  Medication Dose Route Frequency Provider Last Rate Last Admin  . cyanocobalamin ((VITAMIN B-12)) injection 1,000 mcg  1,000 mcg Intramuscular Once Louretta Shorten R, MD        PHYSICAL EXAMINATION:  BP 107/71 (BP Location: Left Arm,  Patient Position: Sitting, Cuff Size: Normal)   Pulse 85   Temp 97.7 F (36.5 C) (Tympanic)   Resp 14   Ht 4\' 11"  (1.499 m)   Wt 119 lb (54 kg)   SpO2 92%   BMI 24.04 kg/m   Filed Weights   09/08/20 1302  Weight: 119 lb (54 kg)    Physical Exam Constitutional:      Comments: Elderly frail-appearing female patient.  She is in a wheelchair.  Accompanied by husband.  HENT:     Head: Normocephalic and atraumatic.     Mouth/Throat:     Pharynx: No oropharyngeal exudate.  Eyes:     Pupils: Pupils are equal, round, and reactive to light.  Cardiovascular:     Rate and Rhythm: Normal rate and regular rhythm.  Pulmonary:     Effort: No respiratory distress.     Breath sounds: No wheezing.  Abdominal:     General: Bowel sounds are normal. There is no distension.     Palpations: Abdomen is soft. There is no mass.     Tenderness: There is no abdominal tenderness. There is no guarding or rebound.  Musculoskeletal:        General: No tenderness. Normal range of motion.     Cervical back: Normal range of motion and neck supple.  Skin:    General: Skin is warm.  Neurological:     Mental Status: She is alert and oriented to person, place, and time.   Psychiatric:        Mood and Affect: Affect normal.     LABORATORY DATA:  I have reviewed the data as listed    Component Value Date/Time   NA 139 09/08/2020 1238   NA 139 11/10/2014 0503   K 4.0 09/08/2020 1238   K 3.6 11/10/2014 0503   CL 100 09/08/2020 1238   CL 107 11/10/2014 0503   CO2 28 09/08/2020 1238   CO2 24 11/10/2014 0503   GLUCOSE 110 (H) 09/08/2020 1238   GLUCOSE 84 11/10/2014 0503   BUN 19 09/08/2020 1238   BUN 7 11/10/2014 0503   CREATININE 0.68 09/08/2020 1238   CREATININE 0.56 11/10/2014 0503   CALCIUM 9.4 09/08/2020 1238   CALCIUM 7.8 (L) 11/10/2014 0503   PROT 7.1 02/04/2020 1250   PROT 6.9 11/09/2014 1254   ALBUMIN 3.6 02/04/2020 1250   ALBUMIN 3.5 11/09/2014 1254   AST 27 02/04/2020 1250   AST 19 11/09/2014 1254   ALT 27 02/04/2020 1250   ALT 15 11/09/2014 1254   ALKPHOS 123 02/04/2020 1250   ALKPHOS 155 (H) 11/09/2014 1254   BILITOT 0.3 02/04/2020 1250   BILITOT 0.3 11/09/2014 1254   GFRNONAA >60 09/08/2020 1238   GFRNONAA >60 11/10/2014 0503   GFRAA >60 02/04/2020 1250   GFRAA >60 11/10/2014 0503    No results found for: SPEP, UPEP  Lab Results  Component Value Date   WBC 12.3 (H) 09/08/2020   NEUTROABS 9.6 (H) 09/08/2020   HGB 14.3 09/08/2020   HCT 43.8 09/08/2020   MCV 96.7 09/08/2020   PLT 232 09/08/2020      Chemistry      Component Value Date/Time   NA 139 09/08/2020 1238   NA 139 11/10/2014 0503   K 4.0 09/08/2020 1238   K 3.6 11/10/2014 0503   CL 100 09/08/2020 1238   CL 107 11/10/2014 0503   CO2 28 09/08/2020 1238   CO2 24 11/10/2014 0503   BUN 19 09/08/2020 1238  BUN 7 11/10/2014 0503   CREATININE 0.68 09/08/2020 1238   CREATININE 0.56 11/10/2014 0503      Component Value Date/Time   CALCIUM 9.4 09/08/2020 1238   CALCIUM 7.8 (L) 11/10/2014 0503   ALKPHOS 123 02/04/2020 1250   ALKPHOS 155 (H) 11/09/2014 1254   AST 27 02/04/2020 1250   AST 19 11/09/2014 1254   ALT 27 02/04/2020 1250   ALT 15 11/09/2014  1254   BILITOT 0.3 02/04/2020 1250   BILITOT 0.3 11/09/2014 1254       RADIOGRAPHIC STUDIES: I have personally reviewed the radiological images as listed and agreed with the findings in the report. No results found.   ASSESSMENT & PLAN:  Iron deficiency anemia due to chronic blood loss # Anemia- multifactorial/iron deficiency-B12 deficiency secondary to previous GI surgeries.s/p IV ferrahem-x2 [[id number]] Hb-14 today;STABLE HOLD IV ferrahem infusion today.   #  B12 deficiency- continue B12 shots every 3-4 months;STABLE; ; proceed with B12 injection today.  Continue p.o. B12.  # DISPOSITION: # NO Ferrahem infusion today;B 12 injection today.  # follow up in 4 months-MD; cbc/bmp/iron studies; ferritin- B12 injection/ Possible ferrahem-Dr.B       Ashley Coder, MD 09/08/2020 1:49 PM

## 2020-11-23 ENCOUNTER — Ambulatory Visit
Admission: RE | Admit: 2020-11-23 | Discharge: 2020-11-23 | Disposition: A | Discharge status: Home / Self Care | Payer: Medicare PPO | Attending: Pain Medicine | Admitting: Pain Medicine

## 2020-11-23 ENCOUNTER — Ambulatory Visit
Admission: RE | Admit: 2020-11-23 | Discharge: 2020-11-23 | Disposition: A | Discharge status: Home / Self Care | Payer: Medicare PPO | Source: Ambulatory Visit | Attending: Pain Medicine | Admitting: Pain Medicine

## 2020-11-23 ENCOUNTER — Other Ambulatory Visit
Admission: RE | Admit: 2020-11-23 | Discharge: 2020-11-23 | Disposition: A | Discharge status: Home / Self Care | Payer: Medicare PPO | Source: Home / Self Care | Attending: Pain Medicine | Admitting: Pain Medicine

## 2020-11-23 ENCOUNTER — Encounter: Payer: Self-pay | Admitting: Pain Medicine

## 2020-11-23 ENCOUNTER — Ambulatory Visit (HOSPITAL_BASED_OUTPATIENT_CLINIC_OR_DEPARTMENT_OTHER): Payer: Medicare PPO | Admitting: Pain Medicine

## 2020-11-23 ENCOUNTER — Other Ambulatory Visit: Payer: Self-pay

## 2020-11-23 VITALS — BP 111/59 | HR 66 | Temp 97.2°F | Resp 18 | Ht 62.0 in | Wt 118.0 lb

## 2020-11-23 DIAGNOSIS — I5022 Chronic systolic (congestive) heart failure: Secondary | ICD-10-CM | POA: Insufficient documentation

## 2020-11-23 DIAGNOSIS — M5441 Lumbago with sciatica, right side: Secondary | ICD-10-CM | POA: Insufficient documentation

## 2020-11-23 DIAGNOSIS — F0394 Unspecified dementia, unspecified severity, with anxiety: Secondary | ICD-10-CM | POA: Insufficient documentation

## 2020-11-23 DIAGNOSIS — Z789 Other specified health status: Secondary | ICD-10-CM | POA: Insufficient documentation

## 2020-11-23 DIAGNOSIS — M79605 Pain in left leg: Secondary | ICD-10-CM

## 2020-11-23 DIAGNOSIS — M20092 Other deformity of left finger(s): Secondary | ICD-10-CM | POA: Insufficient documentation

## 2020-11-23 DIAGNOSIS — M79642 Pain in left hand: Secondary | ICD-10-CM | POA: Insufficient documentation

## 2020-11-23 DIAGNOSIS — Z79899 Other long term (current) drug therapy: Secondary | ICD-10-CM | POA: Insufficient documentation

## 2020-11-23 DIAGNOSIS — M899 Disorder of bone, unspecified: Secondary | ICD-10-CM | POA: Insufficient documentation

## 2020-11-23 DIAGNOSIS — G8929 Other chronic pain: Secondary | ICD-10-CM | POA: Insufficient documentation

## 2020-11-23 DIAGNOSIS — D72829 Elevated white blood cell count, unspecified: Secondary | ICD-10-CM | POA: Insufficient documentation

## 2020-11-23 DIAGNOSIS — M20091 Other deformity of right finger(s): Secondary | ICD-10-CM

## 2020-11-23 DIAGNOSIS — F0280 Dementia in other diseases classified elsewhere without behavioral disturbance: Secondary | ICD-10-CM | POA: Insufficient documentation

## 2020-11-23 DIAGNOSIS — D7589 Other specified diseases of blood and blood-forming organs: Secondary | ICD-10-CM | POA: Insufficient documentation

## 2020-11-23 DIAGNOSIS — M25551 Pain in right hip: Secondary | ICD-10-CM | POA: Insufficient documentation

## 2020-11-23 DIAGNOSIS — G9009 Other idiopathic peripheral autonomic neuropathy: Secondary | ICD-10-CM | POA: Insufficient documentation

## 2020-11-23 DIAGNOSIS — D75839 Thrombocytosis, unspecified: Secondary | ICD-10-CM | POA: Insufficient documentation

## 2020-11-23 DIAGNOSIS — M5442 Lumbago with sciatica, left side: Secondary | ICD-10-CM | POA: Insufficient documentation

## 2020-11-23 DIAGNOSIS — F329 Major depressive disorder, single episode, unspecified: Secondary | ICD-10-CM | POA: Insufficient documentation

## 2020-11-23 DIAGNOSIS — M79641 Pain in right hand: Secondary | ICD-10-CM | POA: Insufficient documentation

## 2020-11-23 DIAGNOSIS — M79604 Pain in right leg: Secondary | ICD-10-CM | POA: Insufficient documentation

## 2020-11-23 DIAGNOSIS — F0390 Unspecified dementia without behavioral disturbance: Secondary | ICD-10-CM | POA: Insufficient documentation

## 2020-11-23 DIAGNOSIS — G43909 Migraine, unspecified, not intractable, without status migrainosus: Secondary | ICD-10-CM | POA: Insufficient documentation

## 2020-11-23 DIAGNOSIS — N3941 Urge incontinence: Secondary | ICD-10-CM | POA: Insufficient documentation

## 2020-11-23 DIAGNOSIS — K219 Gastro-esophageal reflux disease without esophagitis: Secondary | ICD-10-CM | POA: Insufficient documentation

## 2020-11-23 DIAGNOSIS — F0150 Vascular dementia without behavioral disturbance: Secondary | ICD-10-CM | POA: Insufficient documentation

## 2020-11-23 DIAGNOSIS — M25552 Pain in left hip: Secondary | ICD-10-CM

## 2020-11-23 DIAGNOSIS — R7302 Impaired glucose tolerance (oral): Secondary | ICD-10-CM | POA: Insufficient documentation

## 2020-11-23 DIAGNOSIS — G894 Chronic pain syndrome: Secondary | ICD-10-CM | POA: Diagnosis not present

## 2020-11-23 DIAGNOSIS — M1990 Unspecified osteoarthritis, unspecified site: Secondary | ICD-10-CM | POA: Insufficient documentation

## 2020-11-23 DIAGNOSIS — R32 Unspecified urinary incontinence: Secondary | ICD-10-CM | POA: Insufficient documentation

## 2020-11-23 DIAGNOSIS — F0391 Unspecified dementia with behavioral disturbance: Secondary | ICD-10-CM | POA: Insufficient documentation

## 2020-11-23 DIAGNOSIS — E785 Hyperlipidemia, unspecified: Secondary | ICD-10-CM | POA: Insufficient documentation

## 2020-11-23 DIAGNOSIS — I509 Heart failure, unspecified: Secondary | ICD-10-CM | POA: Insufficient documentation

## 2020-11-23 DIAGNOSIS — Z9849 Cataract extraction status, unspecified eye: Secondary | ICD-10-CM | POA: Insufficient documentation

## 2020-11-23 DIAGNOSIS — D509 Iron deficiency anemia, unspecified: Secondary | ICD-10-CM | POA: Insufficient documentation

## 2020-11-23 DIAGNOSIS — G4700 Insomnia, unspecified: Secondary | ICD-10-CM | POA: Insufficient documentation

## 2020-11-23 DIAGNOSIS — J309 Allergic rhinitis, unspecified: Secondary | ICD-10-CM | POA: Insufficient documentation

## 2020-11-23 DIAGNOSIS — R269 Unspecified abnormalities of gait and mobility: Secondary | ICD-10-CM | POA: Insufficient documentation

## 2020-11-23 DIAGNOSIS — R748 Abnormal levels of other serum enzymes: Secondary | ICD-10-CM | POA: Insufficient documentation

## 2020-11-23 DIAGNOSIS — F330 Major depressive disorder, recurrent, mild: Secondary | ICD-10-CM | POA: Insufficient documentation

## 2020-11-23 DIAGNOSIS — D72828 Other elevated white blood cell count: Secondary | ICD-10-CM | POA: Insufficient documentation

## 2020-11-23 LAB — COMPREHENSIVE METABOLIC PANEL
ALT: 28 U/L (ref 0–44)
AST: 24 U/L (ref 15–41)
Albumin: 3.6 g/dL (ref 3.5–5.0)
Alkaline Phosphatase: 101 U/L (ref 38–126)
Anion gap: 8 (ref 5–15)
BUN: 19 mg/dL (ref 8–23)
CO2: 27 mmol/L (ref 22–32)
Calcium: 9.3 mg/dL (ref 8.9–10.3)
Chloride: 104 mmol/L (ref 98–111)
Creatinine, Ser: 0.54 mg/dL (ref 0.44–1.00)
GFR, Estimated: >60 mL/min (ref >60)
Glucose, Bld: 95 mg/dL (ref 70–99)
Potassium: 4.5 mmol/L (ref 3.5–5.1)
Sodium: 139 mmol/L (ref 135–145)
Total Bilirubin: 0.5 mg/dL (ref 0.3–1.2)
Total Protein: 6.6 g/dL (ref 6.5–8.1)

## 2020-11-23 LAB — SEDIMENTATION RATE: Sed Rate: 9 mm/hr (ref 0–30)

## 2020-11-23 LAB — VITAMIN B12: Vitamin B-12: 4081 pg/mL — ABNORMAL HIGH (ref 180–914)

## 2020-11-23 LAB — VITAMIN D 25 HYDROXY (VIT D DEFICIENCY, FRACTURES): Vit D, 25-Hydroxy: 36.38 ng/mL (ref 30–100)

## 2020-11-23 LAB — MAGNESIUM: Magnesium: 2.2 mg/dL (ref 1.7–2.4)

## 2020-11-23 LAB — C-REACTIVE PROTEIN: CRP: 0.6 mg/dL (ref <1.0)

## 2020-11-23 NOTE — Progress Notes (Signed)
Patient: Ashley Young  Service Category: E/M  Provider: Gaspar Cola, MD  DOB: 02-22-1941  DOS: 11/23/2020  Referring Provider: Erven Colla  MRN: [id number]  Setting: Ambulatory outpatient  PCP: Merlene Laughter, MD  Type: New Patient  Specialty: Interventional Pain Management    Location: Office  Delivery: Face-to-face     Primary Reason(s) for Visit: Encounter for initial evaluation of one or more chronic problems (new to examiner) potentially causing chronic pain, and posing a threat to normal musculoskeletal function. (Level of risk: High) CC: Back Pain (low), Hip Pain (bilateral), Foot Pain (bilateral), and Leg Pain (bilateral)  HPI  Ashley Young is a 80 y.o. year old, female patient, who comes for the first time to our practice referred by Wonda Cerise, PA-C for our initial evaluation of her chronic pain. She has Abdominal pain; Acquired gastric outlet stenosis; Adverse effect of salicylate; Anxiety state; DDD (degenerative disc disease), lumbar; Anxiety and depression; Clinical depression; Esophagitis; H/O gastrointestinal hemorrhage; H/O gastric ulcer; Bilateral lower extremity edema; Disorder of nutrition; Headache, migraine; Calculus of kidney; Osteoporosis, post-menopausal; OP (osteoporosis); Avitaminosis D; B12 deficiency; Iron deficiency anemia due to chronic blood loss; Borderline diabetes mellitus; Chronic systolic heart failure ([place]); Degeneration of intervertebral disc of lumbar region; History of anemia; Pure hypercholesterolemia; Recurrent major depression in remission ([place]); Goals of care, counseling/discussion; Severe recurrent major depression without psychotic features ([place]); Failure to thrive (0-17); Hypokalemia; Hip fracture, left ([place]); Palliative care encounter; DNR (do not resuscitate); Mood disorder ([place]); Major depressive disorder, single episode, unspecified; Abnormal gait; Abnormal levels of other serum enzymes; Allergic rhinitis; Closed fracture  of ischium ([place]); Alzheimer's disease ([place]); Confusion; Dementia ([place]); Congestive heart failure ([place]); Difficulty sleeping; History of cataract extraction; Hyperlipidemia; Hypocalcemia; Idiopathic peripheral autonomic neuropathy; Impaired glucose tolerance; Insomnia; Leg cramping; Chronic lower extremity pain (1ry area of Pain) (Bilateral); Other elevated white blood cell count; Leukocytosis; Macrocytosis; Malnourished ([place]); Memory loss or impairment; Mild recurrent major depression ([place]); Osteoarthritis; Other fatigue; Pulmonary hypertension, mild ([place]); Restless legs syndrome; Thrombocytosis; Urge incontinence of urine; Urinary incontinence; Vaccine counseling; 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[place]); 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 [facility]); Vitamin D deficiency; Chronic systolic CHF (congestive heart failure) ([place]); Gastroesophageal reflux disease; Encounter for general adult medical examination without abnormal findings; Migraine; History of microcytic hypochromic anemia; Iron deficiency anemia; Chronic pain syndrome; Pharmacologic therapy; Disorder of skeletal system; Problems influencing health status; Chronic hip pain (Bilateral); Chronic hand pain (Bilateral); Ulnar deviation of fingers of hands (Bilateral); and 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 Today she comes in for evaluation of her Back Pain (low), Hip Pain (bilateral), Foot Pain (bilateral), and Leg Pain (bilateral)  Pain Assessment: Location: Lower Back Radiating: legs, hips, feet Onset: More than a month ago Duration: Chronic pain Quality: Aching,Sharp,Throbbing,Shooting,Constant Severity: 8 /10 (subjective, self-reported pain score)  Effect on ADL:   Timing: Constant Modifying factors: cannot tell BP: (!) 111/59  HR: 66  Onset and Duration: Date of onset: 2020 Cause of pain: neuropathy Severity: Getting worse Timing: Afternoon and Evening Aggravating Factors: Prolonged sitting Alleviating Factors:  Medications Associated Problems: Depression, Fatigue, Inability to control bladder (urine), Numbness, Personality changes, Sadness, Sweating, Weakness, Pain that wakes patient up and Pain that does not allow patient to sleep Quality of Pain: Aching, Agonizing, Deep, Disabling, Distressing, Exhausting, Horrible, Itching, Pulsating, Sharp, Shooting, Throbbing, Tiring and Uncomfortable Previous Examinations or Tests: EMG/PNCV and Neurological evaluation Previous Treatments: Narcotic medications  The patient comes into the clinic today accompanied by  her husband and caregiver, who is up to speed with her situation and needs.  Today's information was provided by both of them.  According to the patient the primary area of pain is that of the lower extremities (Bilateral) with the right being equal to the left.  In the case of both lower extremities the pain appears to start in the buttocks area and travel through the posterior lateral aspect of the leg over the hips and to a certain extent over the lateral aspect of the thighs going all the way down into her feet.  When the pain gets into her feet it seems to be over the top of the foot and the bottom of the foot (L5 and S1 dermatomes respectively) on the right foot and over the entire foot on the left side.  The patient's secondary area pain is that of the lower back (Bilateral).  This pain is mainly in the very lower portion of the back, over the buttocks area.  The patient's third area pain is that of her hands (Bilateral).  She seems to have ulnar deviation of all of her fingers in both of her hands with decreased range of motion on her right hand where she is unable to completely open the hand.  A lot of this seems to be arthritic in nature.  The patient's husband indicated that she is having some difficulty sleeping at night and this is associated with a combination of the pain and poor sleep hygiene.  Today I took the time to go over what proper sleep  hygiene would be and what they are supposed to do.  Physical exam: The patient comes in in a wheelchair, but she was able to stand up and move onto the bed.  She has a shuffling gait and decreased range of motion of the lumbar spine as well as having a positive Patrick maneuver for bilateral hip joint arthralgia with significant bilateral decreased range of motion of the hips.  The pain and the decrease of motion seem to be worse on the left side.  She also presents with decreased range of motion and ulnar deviation of all of her fingers, especially on the right side.  Today I took the time to provide the patient with information regarding my pain practice. The patient was informed that my practice is divided into two sections: an interventional pain management section, as well as a completely separate and distinct medication management section. I explained that I have procedure days for my interventional therapies, and evaluation days for follow-ups and medication management. Because of the amount of documentation required during both, they are kept separated. This means that there is the possibility that she may be scheduled for a procedure on one day, and medication management the next. I have also informed her that because of staffing and facility limitations, I no longer take patients for medication management only. To illustrate the reasons for this, I gave the patient the example of surgeons, and how inappropriate it would be to refer a patient to his/her care, just to write for the post-surgical antibiotics on a surgery done by a different surgeon.   Because interventional pain management is my board-certified specialty, the patient was informed that joining my practice means that they are open to any and all interventional therapies. I made it clear that this does not mean that they will be forced to have any procedures done. What this means is that I believe interventional therapies to be essential  part of the diagnosis and proper management of chronic pain conditions. Therefore, patients not interested in these interventional alternatives will be better served under the care of a different practitioner.  The patient was also made aware of my Comprehensive Pain Management Safety Guidelines where by joining my practice, they limit all of their nerve blocks and joint injections to those done by our practice, for as long as we are retained to manage their care.   Historic Controlled Substance Pharmacotherapy Review  PMP and historical list of controlled substances: Tramadol 50 mg tablet, 1 tab p.o. twice daily Current opioid analgesics:  Tramadol 50 mg tablet, 1 tab p.o. twice daily (100 mg/day of tramadol) MME/day: 10 mg/day  Historical Monitoring: The patient  reports no history of drug use. List of all UDS Test(s): No results found for: MDMA, COCAINSCRNUR, [place], [place], CANNABQUANT, THCU, [place] List of other Serum/Urine Drug Screening Test(s):  No results found for: AMPHSCRSER, BARBSCRSER, BENZOSCRSER, COCAINSCRSER, COCAINSCRNUR, PCPSCRSER, PCPQUANT, THCSCRSER, THCU, CANNABQUANT, OPIATESCRSER, OXYSCRSER, PROPOXSCRSER, ETH Historical Background Evaluation: [place] PMP: PDMP reviewed during this encounter. Online review of the past 55-month period conducted.             PMP NARX Score Report:  Narcotic: 290 Sedative: 120 Stimulant: 000 [place] Department of public safety, offender search: Editor, commissioning Information) Non-contributory Risk Assessment Profile: Aberrant behavior: None observed or detected today Risk factors for fatal opioid overdose: None identified today PMP NARX Overdose Risk Score: 090 Fatal overdose hazard ratio (HR): Calculation deferred Non-fatal overdose hazard ratio (HR): Calculation deferred Risk of opioid abuse or dependence: 0.7-3.0% with doses ? 36 MME/day and 6.1-26% with doses ? 120 MME/day. Substance use disorder (SUD) risk level: See below Personal History of  Substance Abuse (SUD-Substance use disorder):  Alcohol:    Illegal Drugs:    Rx Drugs:    ORT Risk Level calculation:    ORT Scoring interpretation table:  Score <3 = Low Risk for SUD  Score between 4-7 = Moderate Risk for SUD  Score >8 = High Risk for Opioid Abuse   PHQ-2 Depression Scale:  Total score: 0  PHQ-2 Scoring interpretation table: (Score and probability of major depressive disorder)  Score 0 = No depression  Score 1 = 15.4% Probability  Score 2 = 21.1% Probability  Score 3 = 38.4% Probability  Score 4 = 45.5% Probability  Score 5 = 56.4% Probability  Score 6 = 78.6% Probability   PHQ-9 Depression Scale:  Total score: 0  PHQ-9 Scoring interpretation table:  Score 0-4 = No depression  Score 5-9 = Mild depression  Score 10-14 = Moderate depression  Score 15-19 = Moderately severe depression  Score 20-27 = Severe depression (2.4 times higher risk of SUD and 2.89 times higher risk of overuse)   Pharmacologic Plan: As per protocol, I have not taken over any controlled substance management, pending the results of ordered tests and/or consults.            Initial impression: Pending review of available data and ordered tests.  Meds   Current Outpatient Medications:  .  acetaminophen (TYLENOL) 325 MG tablet, Take 2 tablets (650 mg total) by mouth every 6 (six) hours as needed for mild pain (or Fever >/= 101)., Disp: , Rfl:  .  ARIPiprazole (ABILIFY) 5 MG tablet, Take 5 mg by mouth 2 (two) times daily., Disp: , Rfl:  .  atorvastatin (LIPITOR) 20 MG tablet, Take 20 mg by mouth at bedtime., Disp: , Rfl:  .  calcium-vitamin D (  OSCAL WITH D) 500-200 MG-UNIT tablet, Take 1 tablet by mouth daily with breakfast., Disp: , Rfl:  .  DULoxetine (CYMBALTA) 30 MG capsule, Take 30 mg by mouth daily. , Disp: , Rfl:  .  furosemide (LASIX) 40 MG tablet, Take 40 mg by mouth every morning., Disp: , Rfl:  .  gabapentin (NEURONTIN) 300 MG capsule, Take 300 mg by mouth in the morning, at noon,  and at bedtime., Disp: , Rfl:  .  Multiple Vitamin (MULTIVITAMIN) tablet, Take 1 tablet by mouth daily., Disp: , Rfl:  .  ondansetron (ZOFRAN) 4 MG tablet, Take 4 mg by mouth every 8 (eight) hours as needed for nausea or vomiting., Disp: , Rfl:  .  Oyster Shell (OYSTER CALCIUM) 500 MG TABS tablet, Take 500 mg of elemental calcium by mouth daily., Disp: , Rfl:  .  pantoprazole (PROTONIX) 40 MG tablet, Take 40 mg by mouth daily. , Disp: , Rfl:  .  polyethylene glycol (MIRALAX / GLYCOLAX) packet, Take 17 g by mouth daily as needed., Disp: , Rfl:  .  potassium chloride (K-DUR,KLOR-CON) 10 MEQ tablet, Take 1 tablet by mouth daily., Disp: , Rfl:  .  promethazine (PHENERGAN) 25 MG tablet, Take 25 mg by mouth every 6 (six) hours as needed for nausea or vomiting., Disp: , Rfl:  .  SUMAtriptan (IMITREX) 100 MG tablet, 100 mg every 2 (two) hours as needed. , Disp: , Rfl:  .  traMADol (ULTRAM) 50 MG tablet, Take 50 mg by mouth at bedtime as needed., Disp: , Rfl:  .  traZODone (DESYREL) 50 MG tablet, Take 25 mg by mouth at bedtime., Disp: , Rfl:  .  vitamin B-12 (CYANOCOBALAMIN) 500 MCG tablet, Take 500 mcg by mouth daily., Disp: , Rfl:  .  latanoprost (XALATAN) 0.005 % ophthalmic solution, Place 1 drop into both eyes at bedtime.  (Patient not taking: No sig reported), Disp: , Rfl:  .  loperamide (IMODIUM A-D) 2 MG tablet, Take 2 mg by mouth 3 (three) times daily as needed for diarrhea or loose stools. (Patient not taking: No sig reported), Disp: , Rfl:  .  MYRBETRIQ 25 MG TB24 tablet, Take 25 mg by mouth daily. (Patient not taking: Reported on 11/23/2020), Disp: , Rfl:   Imaging Review  Cervical Imaging: Cervical CT wo contrast: Results for orders placed during the hospital encounter of 09/08/19 CT CERVICAL SPINE WO CONTRAST  Narrative CLINICAL DATA:  History of neck trauma, presents from home status post fall.  EXAM: CT HEAD WITHOUT CONTRAST  CT CERVICAL SPINE WITHOUT  CONTRAST  TECHNIQUE: Multidetector CT imaging of the head and cervical spine was performed following the standard protocol without intravenous contrast. Multiplanar CT image reconstructions of the cervical spine were also generated.  COMPARISON:  09/19/2017  FINDINGS: CT HEAD FINDINGS  Brain: No evidence of acute infarction, hemorrhage, hydrocephalus, extra-axial collection or mass lesion/mass effect.  Signs of atrophy and microvascular ischemic change as before.  Vascular: No hyperdense vessel or unexpected calcification.  Skull: Normal. Negative for fracture or focal lesion.  Sinuses/Orbits: Visualized paranasal sinuses and orbits are unremarkable.  Other: None  CT CERVICAL SPINE FINDINGS  Alignment: Mild anterolisthesis of C3 on C4 approximately 2-3 mm unchanged since 2015.  Skull base and vertebrae: No acute fracture. No primary bone lesion or focal pathologic process.  Soft tissues and spinal canal: No prevertebral fluid or swelling. No visible canal hematoma.  Disc levels: Multilevel degenerative change greatest at C4-5 and C5-6 as well C6-7 with uncovertebral degenerative  changes and facet arthropathy disc space narrowing at these levels and degenerative changes worse than in 2015. Atlantoaxial degenerative changes also noted unchanged from previous exam.  Upper chest: Biapical pleural and parenchymal scarring.  Other: None  IMPRESSION: 1. No evidence for acute traumatic intracranial injury. 2. No evidence for acute fracture or malalignment of the cervical spine. 3. Worsening degenerative change compared to prior imaging in the cervical spine.   Electronically Signed By: Zetta Bills M.D. On: 09/08/2019 09:27  Lumbosacral Imaging: Lumbar CT w contrast: Results for orders placed in visit on 05/03/03 CT Lumbar Spine W Contrast  Narrative FINDINGS CLINICAL DATA:  BACK PAIN. LUMBAR MYELOGRAM LUMBAR PUNCTURE WAS PERFORMED BY DR. CABBELL AND AN  APPROPRIATE AMOUNT OF OMNIPAQUE 180 WAS INSTILLED IN THE SUBARACHNOID SPACE.  AP, LATERAL AND OBLIQUE VIEWS SHOW NO NERVE ROOT ENCROACHMENT, SPINAL STENOSIS OR EXTRADURAL DEFECT. POST MYELOGRAM CT THE LOWER DISC SPACES ARE EXAMINED. L2-3:  NORMAL INTERSPACE. L3-4:  NORMAL INTERSPACE. L4-5:  NORMAL INTERSPACE. L5-S1:  SMALL RIGHT PARACENTRAL PROTRUSION WHICH IS PARTIALLY CALCIFIED.  MILD DORSAL DISPLACEMENT OF THE RIGHT S1 ROOT IS SEEN. IMPRESSION 1.  SMALL RIGHT PARACENTRAL DISC PROTRUSION WITH CALCIFICATION; MILD DORSAL DISPLACEMENT OF THE RIGHT S1 NERVE ROOT.  Lumbar DG Myelogram views: Results for orders placed in visit on 05/03/03 DG Myelogram Lumbar  Narrative FINDINGS CLINICAL DATA:  BACK PAIN. LUMBAR MYELOGRAM LUMBAR PUNCTURE WAS PERFORMED BY DR. CABBELL AND AN APPROPRIATE AMOUNT OF OMNIPAQUE 180 WAS INSTILLED IN THE SUBARACHNOID SPACE.  AP, LATERAL AND OBLIQUE VIEWS SHOW NO NERVE ROOT ENCROACHMENT, SPINAL STENOSIS OR EXTRADURAL DEFECT. POST MYELOGRAM CT THE LOWER DISC SPACES ARE EXAMINED. L2-3:  NORMAL INTERSPACE. L3-4:  NORMAL INTERSPACE. L4-5:  NORMAL INTERSPACE. L5-S1:  SMALL RIGHT PARACENTRAL PROTRUSION WHICH IS PARTIALLY CALCIFIED.  MILD DORSAL DISPLACEMENT OF THE RIGHT S1 ROOT IS SEEN. IMPRESSION 1.  SMALL RIGHT PARACENTRAL DISC PROTRUSION WITH CALCIFICATION; MILD DORSAL DISPLACEMENT OF THE RIGHT S1 NERVE ROOT.  Lumbar DG Epidurogram OP: Results for orders placed during the hospital encounter of 05/22/10 DG Epidurography  Narrative EPIDUROGRAM S+I  Clinical Data:   Displacement of lumbar intervertebral disc without myelopathy.  Left leg sciatica.  Little improvement from the first injection.  I went at the level above.  Procedure: Left L4-L5 Interlaminar Epidural Steroid Injection  Fully informed written consent was obtained on the first visit. The patient was placed prone on the fluoroscopic table,  the L4-L5 interspace was visualized, and the skin was  marked.  After thorough povidone-iodine scrubbing, the site was draped in sterile fashion. 1% lidocaine was used to anesthetize the skin and deeper soft tissues.  A 20-gauge Crawford epidural needle was inserted and advanced into the epidural space using loss of resistance technique with normal saline.  The epidural space was identified on first pass without evidence of blood, cerebrospinal fluid, or paresthesias.  Needle tip placement within the epidural space was confirmed using 1 mL of non-ionic contrast.  There was no intravascular or subarachnoid opacification.  Next, $Remo'120mg'TcjUG$  of Depo- Medrol was administered and flushed using 2 mL of normal saline. The needle was restyletted and withdrawn.  The patient tolerated the procedure well and there was no evidence of procedural complication.  The patient was able to ambulate to the recovery area and observed for an appropriate length of time as outlined on the nursing notes. The patient was discharged home with a driver in good condition with detailed patient instructions.  Fluoroscopy time:  11 seconds  IMPRESSION:  Satisfactory interlaminar  lumbar epidural steroid injection, left L4-L5, #2.  Provider: Ceasar Lund  Hip Imaging: Hip-R DG 2-3 views: Results for orders placed during the hospital encounter of 09/08/19 DG Hip Unilat W or Wo Pelvis 2-3 Views Right  Narrative CLINICAL DATA:  Right hip pain after fall.  EXAM: DG HIP (WITH OR WITHOUT PELVIS) 2-3V RIGHT  COMPARISON:  2014 assessment  FINDINGS: Signs of sacroplasty/vertebroplasty.  Osteopenia.  Post right femoral neck fracture ORIF with 3 lack type screws.  Signs of left femoral nailing.  No signs of fracture about the right proximal femur. Hardware appears engaged and intact.  Irregularity again noted along the superior and inferior pubic ramus on the right appears little changed compared to exam of 2018 appears to reflect prior injury.  IMPRESSION: 1. No  signs of acute fracture or dislocation. 2. Osteopenia. 3. Post ORIF right femoral neck fracture. Hardware appears engaged and intact.   Electronically Signed By: Zetta Bills M.D. On: 09/08/2019 09:37  Hip-L DG 2-3 views: Results for orders placed during the hospital encounter of 01/20/17 DG Hip Unilat With Pelvis 2-3 Views Left  Narrative CLINICAL DATA:  Recent fall with left hip pain, initial encounter  EXAM: DG HIP (WITH OR WITHOUT PELVIS) 3V LEFT  COMPARISON:  12/28/2016  FINDINGS: Postsurgical changes are noted in the proximal femurs bilaterally. Prior vertebral augmentation at L5 as noted. Bilateral soft tissue calcifications are noted over the iliac wings. No acute fracture or dislocation is noted. No acute soft tissue abnormality is seen.  IMPRESSION: Postsurgical changes without acute abnormality.   Electronically Signed By: Inez Catalina M.D. On: 01/20/2017 07:36  Elbow Imaging: Elbow-L DG Complete: Results for orders placed during the hospital encounter of 01/20/17 DG Elbow Complete Left  Narrative CLINICAL DATA:  Fall.  EXAM: LEFT ELBOW - COMPLETE 3+ VIEW  COMPARISON:  06/18/2014 .  FINDINGS: Left elbow joint effusion appears to be present. Subtle lucency noted in the radial head. This could be a vascular channel. Very subtle radial head fracture cannot be completely excluded. Diffuse osteopenia. Mild degenerative change.  IMPRESSION: 1. Left elbow joint effusion appears to be present.  2. Subtle lucency noted in the radial head. This could represent a vascular channel. A subtle nondisplaced fracture cannot be completely excluded.   Electronically Signed By: Marcello Moores  Register On: 01/20/2017 07:35  Complexity Note: Imaging results reviewed. Results shared with Ms. Diltz, using Layman's terms.                        ROS  Cardiovascular: No reported cardiovascular signs or symptoms such as High blood pressure, coronary artery disease,  abnormal heart rate or rhythm, heart attack, blood thinner therapy or heart weakness and/or failure Pulmonary or Respiratory: Smoking(former) Neurological: Abnormal skin sensations (Peripheral Neuropathy) Psychological-Psychiatric: Anxiousness, Depressed and Difficulty sleeping and or falling asleep Gastrointestinal: Reflux or heatburn Genitourinary: No reported renal or genitourinary signs or symptoms such as difficulty voiding or producing urine, peeing blood, non-functioning kidney, kidney stones, difficulty emptying the bladder, difficulty controlling the flow of urine, or chronic kidney disease Hematological: Weakness due to low blood hemoglobin or red blood cell count (Anemia) Endocrine: No reported endocrine signs or symptoms such as high or low blood sugar, rapid heart rate due to high thyroid levels, obesity or weight gain due to slow thyroid or thyroid disease Rheumatologic: No reported rheumatological signs and symptoms such as fatigue, joint pain, tenderness, swelling, redness, heat, stiffness, decreased range of motion, with or without associated  rash Musculoskeletal: Negative for myasthenia gravis, muscular dystrophy, multiple sclerosis or malignant hyperthermia Work History: Retired  Allergies  Ms. Selinger is allergic to penicillins, aspirin, ferrous gluconate, iron polysaccharide, nsaids, other, sulfamethoxazole-trimethoprim, tolmetin, and venofer [iron sucrose].  Laboratory Chemistry Profile   Renal Lab Results  Component Value Date   BUN 19 09/08/2020   CREATININE 0.68 09/08/2020   GFRAA >60 02/04/2020   GFRNONAA >60 09/08/2020   SPECGRAV 1.015 01/10/2015   PHUR 5.5 01/10/2015   PROTEINUR NEGATIVE 09/08/2019     Electrolytes Lab Results  Component Value Date   NA 139 09/08/2020   K 4.0 09/08/2020   CL 100 09/08/2020   CALCIUM 9.4 09/08/2020   MG 1.8 10/20/2017   PHOS 3.9 12/31/2016     Hepatic Lab Results  Component Value Date   AST 27 02/04/2020   ALT 27  02/04/2020   ALBUMIN 3.6 02/04/2020   ALKPHOS 123 02/04/2020     ID Lab Results  Component Value Date   STAPHAUREUS NEGATIVE 12/29/2016   MRSAPCR NEGATIVE 12/29/2016     Bone No results found for: VD25OH, [id number], [id number], [id number], 25OHVITD1, 25OHVITD2, 25OHVITD3, TESTOFREE, TESTOSTERONE   Endocrine Lab Results  Component Value Date   GLUCOSE 110 (H) 09/08/2020   GLUCOSEU NEGATIVE 09/08/2019   TSH 7.096 (H) 11/09/2014     Neuropathy Lab Results  Component Value Date   VITAMINB12 329 09/02/2018   FOLATE 8.3 04/24/2015     CNS No results found for: COLORCSF, APPEARCSF, RBCCOUNTCSF, WBCCSF, POLYSCSF, LYMPHSCSF, EOSCSF, PROTEINCSF, GLUCCSF, JCVIRUS, CSFOLI, IGGCSF, LABACHR, ACETBL, LABACHR, ACETBL   Inflammation (CRP: Acute  ESR: Chronic) Lab Results  Component Value Date   LATICACIDVEN 1.7 11/09/2014     Rheumatology No results found for: RF, ANA, LABURIC, URICUR, LYMEIGGIGMAB, LYMEABIGMQN, HLAB27   Coagulation Lab Results  Component Value Date   INR 0.9 05/31/2013   LABPROT 12.7 05/31/2013   APTT 29.9 03/31/2013   PLT 232 09/08/2020     Cardiovascular Lab Results  Component Value Date   CKTOTAL 255 (H) 09/08/2019   CKMB 2.9 05/31/2013   TROPONINI <0.03 10/20/2017   HGB 14.3 09/08/2020   HCT 43.8 09/08/2020     Screening Lab Results  Component Value Date   STAPHAUREUS NEGATIVE 12/29/2016   MRSAPCR NEGATIVE 12/29/2016     Cancer No results found for: CEA, CA125, LABCA2   Allergens No results found for: ALMOND, APPLE, ASPARAGUS, AVOCADO, BANANA, BARLEY, BASIL, BAYLEAF, GREENBEAN, LIMABEAN, WHITEBEAN, BEEFIGE, REDBEET, BLUEBERRY, BROCCOLI, CABBAGE, MELON, CARROT, CASEIN, CASHEWNUT, CAULIFLOWER, CELERY     Note: Lab results reviewed.  [place]  Drug: Ms. Vanpelt  reports no history of drug use. Alcohol:  reports no history of alcohol use. Tobacco:  reports that she quit smoking about 27 years ago. Her smoking use included cigarettes. She has  never used smokeless tobacco. Medical:  has a past medical history of Arthritis, B12 deficiency, CHF (congestive heart failure) ([place]), Chronic systolic heart failure ([place]), Dementia ([place]), Depression, Dysrhythmia, Failure to thrive (0-17), GERD (gastroesophageal reflux disease), Glaucoma, Headache, History of kidney stones, Hypercholesterolemia, IDA (iron deficiency anemia), Migraine, Mood disorder ([place]), Multiple gastric ulcers, Osteoporosis, Renal disorder, and Wears dentures. Family: family history includes Breast cancer in her mother; Lung cancer in her father.  Past Surgical History:  Procedure Laterality Date  . ABDOMINAL SURGERY     Reconstructed stomach and 3 different surgeries.   . APPENDECTOMY    . BREAST SURGERY     cyst removal  . CATARACT EXTRACTION W/PHACO Right  06/05/2015   Procedure: CATARACT EXTRACTION PHACO AND INTRAOCULAR LENS PLACEMENT (IOC);  Surgeon: Leandrew Koyanagi, MD;  Location: [facility];  Service: Ophthalmology;  Laterality: Right;  [place] 01:06   . CATARACT EXTRACTION W/PHACO Left 04/15/2018   Procedure: CATARACT EXTRACTION PHACO AND INTRAOCULAR LENS PLACEMENT ([place]) LEFT;  Surgeon: Leandrew Koyanagi, MD;  Location: [place];  Service: Ophthalmology;  Laterality: Left;  . CESAREAN SECTION     x 3  . CHOLECYSTECTOMY    . COLONOSCOPY    . ESOPHAGOGASTRODUODENOSCOPY    . INTRAMEDULLARY (IM) NAIL INTERTROCHANTERIC Left 12/29/2016   Procedure: INTRAMEDULLARY (IM) NAIL INTERTROCHANTRIC;  Surgeon: Hessie Knows, MD;  Location: [facility];  Service: Orthopedics;  Laterality: Left;  . KYPHOPLASTY    . KYPHOPLASTY    . LITHOTRIPSY     and stone extraction   Active Ambulatory Problems    Diagnosis Date Noted  . Abdominal pain 11/19/2013  . Acquired gastric outlet stenosis 08/18/2013  . Adverse effect of salicylate 32/06/2481  . Anxiety state 01/04/2014  . DDD (degenerative disc disease), lumbar 03/09/2014  . Anxiety and depression 08/14/2013  . Clinical  depression 01/04/2014  . Esophagitis 08/05/2013  . H/O gastrointestinal hemorrhage 08/07/2013  . H/O gastric ulcer 08/21/2013  . Bilateral lower extremity edema 01/04/2014  . Disorder of nutrition 08/01/2013  . Headache, migraine 08/07/2013  . Calculus of kidney 07/30/2013  . 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 OP (osteoporosis) 08/18/2013  . Avitaminosis D 02/09/2014  . B12 deficiency 04/26/2015  . Iron deficiency anemia due to chronic blood loss 04/26/2015  . Borderline diabetes mellitus 01/02/2016  . Chronic systolic heart failure ([place]) 01/12/2016  . Degeneration of intervertebral disc of lumbar region 03/09/2014  . History of anemia 03/28/2015  . Pure hypercholesterolemia 01/02/2016  . Recurrent major depression in remission ([place]) 01/04/2014  . Goals of care, counseling/discussion 01/02/2016  . Severe recurrent major depression without psychotic features ([place]) 12/24/2016  . Failure to thrive (0-17) 12/24/2016  . Hypokalemia 12/27/2016  . Hip fracture, left ([place])   . Palliative care encounter   . DNR (do not resuscitate) 12/30/2016  . Mood disorder ([place])   . Major depressive disorder, single episode, unspecified 11/23/2020  . Abnormal gait 11/23/2020  . Abnormal levels of other serum enzymes 11/23/2020  . Allergic rhinitis 11/23/2020  . Closed fracture of ischium ([place]) 07/16/2016  . Alzheimer's disease ([place]) 11/23/2020  . Confusion 05/02/2020  . Dementia ([place]) 11/23/2020  . Congestive heart failure ([place]) 11/23/2020  . Difficulty sleeping 05/02/2020  . History of cataract extraction 11/23/2020  . Hyperlipidemia 11/23/2020  . Hypocalcemia 11/23/2020  . Idiopathic peripheral autonomic neuropathy 11/23/2020  . Impaired glucose tolerance 11/23/2020  . Insomnia 11/23/2020  . Leg cramping 10/27/2019  . Chronic lower extremity pain (1ry area of Pain) (Bilateral) 10/27/2019  . Other elevated white blood cell count 11/23/2020  . Leukocytosis 11/23/2020  . Macrocytosis  11/23/2020  . Malnourished ([place]) 08/08/2013  . Memory loss or impairment 12/21/2019  . Mild recurrent major depression ([place]) 11/23/2020  . Osteoarthritis 11/23/2020  . Other fatigue 12/21/2019  . Pulmonary hypertension, mild ([place]) 05/12/2017  . Restless legs syndrome 10/27/2019  . Thrombocytosis 11/23/2020  . Urge incontinence of urine 11/23/2020  . Urinary incontinence 11/23/2020  . Vaccine counseling 01/02/2016  . 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[place]) 11/23/2020  . 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 Vitamin D deficiency 02/09/2014  . Chronic systolic CHF (congestive heart failure) ([place]) 11/23/2020  . Gastroesophageal reflux disease 11/23/2020  . Encounter for general  adult medical examination without abnormal findings 05/06/2016  . Migraine 11/23/2020  . History of microcytic hypochromic anemia 03/28/2015  . Iron deficiency anemia 11/23/2020  . Chronic pain syndrome 11/23/2020  . Pharmacologic therapy 11/23/2020  . Disorder of skeletal system 11/23/2020  . Problems influencing health status 11/23/2020  . Chronic hip pain (Bilateral) 11/23/2020  . Chronic hand pain (Bilateral) 11/23/2020  . Ulnar deviation of fingers of hands (Bilateral) 11/23/2020  . 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 No Resolved Ambulatory Problems   Past Medical History:  Diagnosis Date  . Arthritis   . CHF (congestive heart failure) ([place])   . Depression   . Dysrhythmia   . GERD (gastroesophageal reflux disease)   . Glaucoma   . Headache   . History of kidney stones   . Hypercholesterolemia   . IDA (iron deficiency anemia)   . Multiple gastric ulcers   . Osteoporosis   . Renal disorder   . Wears dentures    Constitutional Exam  General appearance: Well nourished, well developed, and well hydrated. In no apparent acute distress Vitals:   11/23/20 1353  BP: (!) 111/59  Pulse: 66  Resp: 18  Temp: (!) 97.2  F (36.2 C)  TempSrc: Temporal  SpO2: 96%  Weight: 118 lb (53.5 kg)  Height: $Remove'5\' 2"'ptIUFRI$  (1.575 m)   BMI Assessment: Estimated body mass index is 21.58 kg/m as calculated from the following:   Height as of this encounter: $RemoveBeforeD'5\' 2"'ExuLskyfazmBbH$  (1.575 m).   Weight as of this encounter: 118 lb (53.5 kg).  BMI interpretation table: BMI level Category Range association with higher incidence of chronic pain  <18 kg/m2 Underweight   18.5-24.9 kg/m2 Ideal body weight   25-29.9 kg/m2 Overweight Increased incidence by 20%  30-34.9 kg/m2 Obese (Class I) Increased incidence by 68%  35-39.9 kg/m2 Severe obesity (Class II) Increased incidence by 136%  >40 kg/m2 Extreme obesity (Class III) Increased incidence by 254%   Patient's current BMI Ideal Body weight  Body mass index is 21.58 kg/m. Ideal body weight: 50.1 kg (110 lb 7.2 oz) Adjusted ideal body weight: 51.5 kg (113 lb 7.5 oz)   BMI Readings from Last 4 Encounters:  11/23/20 21.58 kg/m  09/08/20 24.04 kg/m  05/05/20 22.74 kg/m  02/04/20 21.03 kg/m   Wt Readings from Last 4 Encounters:  11/23/20 118 lb (53.5 kg)  09/08/20 119 lb (54 kg)  05/05/20 112 lb 9.6 oz (51.1 kg)  02/04/20 115 lb (52.2 kg)    Psych/Mental status: Alert, oriented x 3 (person, place, & time)       Eyes: PERLA Respiratory: No evidence of acute respiratory distress  Assessment  Primary Diagnosis & Pertinent Problem List: The primary encounter diagnosis was Chronic pain syndrome. 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  Visit Diagnosis (New problems to examiner): 1. Chronic pain syndrome   2. Pharmacologic therapy   3. Disorder of skeletal system   4.  Problems influencing health status   5. Chronic lower extremity pain (1ry area of Pain) (Bilateral)   6. Chronic hip pain (Bilateral)   7. Chronic hand pain (Bilateral)   8. Ulnar deviation of fingers  of hands (Bilateral)   9. 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 Stempel was reminded that as per protocol, today's visit has been an evaluation only. We have not taken over the patient's controlled substance management.  Problem-specific plan: No problem-specific Assessment & Plan notes found for this encounter.   Lab Orders     Compliance Drug Analysis, Ur     Comp. Metabolic Panel (12)     Magnesium     Vitamin B12     Sedimentation rate     25-Hydroxy vitamin D Lcms D2+D3     C-reactive protein     Rheumatoid factor  Imaging Orders     DG Lumbar Spine Complete W/Bend     DG HIP UNILAT W OR W/O PELVIS 2-3 VIEWS RIGHT     DG HIP UNILAT W OR W/O PELVIS 2-3 VIEWS LEFT     DG Hand Complete Left     DG Hand Complete Right Referral Orders  No referral(s) requested today   Procedure Orders    No procedure(s) ordered today   Pharmacotherapy (current): Medications ordered:  No orders of the defined types were placed in this encounter.  Medications administered during this visit: Zeina B. Dawson "pat" had no medications administered during this visit.   Pharmacological management options:  Opioid Analgesics: The patient was informed that there is no guarantee that she would be a candidate for opioid analgesics. The decision will be made following CDC guidelines. This decision will be based on the results of diagnostic studies, as well as Ms. Hynes's risk profile.   Membrane stabilizer: To be determined at a later time  Muscle relaxant: To be determined at a later time  NSAID: To be determined at a later time  Other analgesic(s): To be determined at a later time   Interventional management  options: Ms. Quinones was informed that there is no guarantee that she would be a candidate for interventional therapies. The decision will be based on the results of diagnostic studies, as well as Ms. Silbernagel's risk profile.  Procedure(s) under consideration:  Diagnostic bilateral intra-articular hip joint injection #1    Provider-requested follow-up: Return in about 2 weeks (around 12/07/2020) for (F2F), (40 min)(2V), (s/p Tests).  Future Appointments  Date Time Provider [place]  01/05/2021  1:00 PM [facility] None  01/05/2021  1:30 PM Cammie Sickle, MD [facility] None  01/05/2021  2:00 PM [facility] INFUSION CHAIR 6 [facility] None    Note by: Gaspar Cola, MD Date: 11/23/2020; Time: 5:15 PM

## 2020-11-23 NOTE — Progress Notes (Signed)
Safety precautions to be maintained throughout the outpatient stay will include: orient to surroundings, keep bed in low position, maintain call bell within reach at all times, provide assistance with transfer out of bed and ambulation.  

## 2020-11-23 NOTE — Patient Instructions (Signed)

## 2020-11-25 LAB — RHEUMATOID FACTOR: Rheumatoid fact SerPl-aCnc: <10 IU/mL (ref <14.0)

## 2020-11-27 ENCOUNTER — Other Ambulatory Visit: Payer: Self-pay | Admitting: Pain Medicine

## 2020-11-27 DIAGNOSIS — M25552 Pain in left hip: Secondary | ICD-10-CM

## 2020-11-27 DIAGNOSIS — G8929 Other chronic pain: Secondary | ICD-10-CM

## 2020-12-04 LAB — COMPLIANCE DRUG ANALYSIS, UR

## 2020-12-05 ENCOUNTER — Ambulatory Visit (HOSPITAL_BASED_OUTPATIENT_CLINIC_OR_DEPARTMENT_OTHER): Payer: Medicare PPO | Admitting: Pain Medicine

## 2020-12-05 ENCOUNTER — Ambulatory Visit
Admission: RE | Admit: 2020-12-05 | Discharge: 2020-12-05 | Disposition: A | Discharge status: Home / Self Care | Payer: Medicare PPO | Source: Ambulatory Visit | Attending: Pain Medicine | Admitting: Pain Medicine

## 2020-12-05 ENCOUNTER — Encounter: Payer: Self-pay | Admitting: Pain Medicine

## 2020-12-05 ENCOUNTER — Other Ambulatory Visit: Payer: Self-pay

## 2020-12-05 VITALS — BP 104/56 | HR 70 | Temp 97.1°F | Resp 13 | Ht 63.0 in | Wt 118.0 lb

## 2020-12-05 DIAGNOSIS — M79605 Pain in left leg: Secondary | ICD-10-CM | POA: Insufficient documentation

## 2020-12-05 DIAGNOSIS — M25552 Pain in left hip: Secondary | ICD-10-CM | POA: Insufficient documentation

## 2020-12-05 DIAGNOSIS — M79604 Pain in right leg: Secondary | ICD-10-CM | POA: Insufficient documentation

## 2020-12-05 DIAGNOSIS — M25551 Pain in right hip: Secondary | ICD-10-CM

## 2020-12-05 DIAGNOSIS — Z8781 Personal history of (healed) traumatic fracture: Secondary | ICD-10-CM | POA: Insufficient documentation

## 2020-12-05 DIAGNOSIS — G8929 Other chronic pain: Secondary | ICD-10-CM | POA: Insufficient documentation

## 2020-12-05 MED ORDER — METHYLPREDNISOLONE ACETATE 80 MG/ML IJ SUSP
INTRAMUSCULAR | Status: AC
  Filled 2020-12-05: qty 2

## 2020-12-05 MED ORDER — LIDOCAINE HCL 2 % IJ SOLN
INTRAMUSCULAR | Status: AC
  Filled 2020-12-05: qty 20

## 2020-12-05 MED ORDER — METHYLPREDNISOLONE ACETATE 80 MG/ML IJ SUSP
80.0000 mg | Freq: Once | INTRAMUSCULAR | Status: AC
  Administered 2020-12-05: 80 mg via INTRA_ARTICULAR

## 2020-12-05 MED ORDER — IOHEXOL 180 MG/ML  SOLN
10.0000 mL | Freq: Once | INTRAMUSCULAR | Status: AC
  Administered 2020-12-05: 10 mL via INTRA_ARTICULAR

## 2020-12-05 MED ORDER — ROPIVACAINE HCL 2 MG/ML IJ SOLN
INTRAMUSCULAR | Status: AC
  Filled 2020-12-05: qty 10

## 2020-12-05 MED ORDER — ROPIVACAINE HCL 2 MG/ML IJ SOLN
4.0000 mL | Freq: Once | INTRAMUSCULAR | Status: AC
  Administered 2020-12-05: 4 mL via INTRA_ARTICULAR

## 2020-12-05 MED ORDER — LIDOCAINE HCL 2 % IJ SOLN
20.0000 mL | Freq: Once | INTRAMUSCULAR | Status: AC
  Administered 2020-12-05: 400 mg

## 2020-12-05 MED ORDER — IOHEXOL 180 MG/ML  SOLN
INTRAMUSCULAR | Status: AC
  Filled 2020-12-05: qty 20

## 2020-12-05 NOTE — Progress Notes (Signed)
Safety precautions to be maintained throughout the outpatient stay will include: orient to surroundings, keep bed in low position, maintain call bell within reach at all times, provide assistance with transfer out of bed and ambulation.  

## 2020-12-05 NOTE — Patient Instructions (Signed)

## 2020-12-05 NOTE — Progress Notes (Signed)
PROVIDER NOTE: Information contained herein reflects review and annotations entered in association with encounter. Interpretation of such information and data should be left to medically-trained personnel. Information provided to patient can be located elsewhere in the medical record under "Patient Instructions". Document created using STT-dictation technology, any transcriptional errors that may result from process are unintentional.    Patient: Ashley Young  Service Category: Procedure  Provider: Oswaldo Done, MD  DOB: [birth year]  DOS: 12/05/2020  Location: [facility]  MRN: [id number]  Setting: Ambulatory - outpatient  Referring Provider: Delano Metz, MD  Type: Established Patient  Specialty: Interventional Pain Management  PCP: Angela Cox, MD   Primary Reason for Visit: Interventional Pain Management Treatment. CC: Leg Pain and Hip Pain  Procedure:          Anesthesia, Analgesia, Anxiolysis:  Type: Intra-Articular Hip Injection #1  Primary Purpose: Diagnostic Region: Posterolateral hip joint area. Level: Lower pelvic and hip joint level. Target Area: Superior aspect of the hip joint cavity, going thru the superior portion of the capsular ligament. Approach: Posterolateral approach. Laterality: Bilateral  Type: Local Anesthesia Indication(s): Analgesia         Route: Infiltration (Story/IM) IV Access: Declined Sedation: Declined  Local Anesthetic: Lidocaine 1-2%  Position: Prone Prepped Area: Entire Posterolateral hip area. DuraPrep (Iodine Povacrylex [0.7% available iodine] and Isopropyl Alcohol, 74% w/w)   Indications: 1. Chronic hip pain (Bilateral)   2. History of fracture of hips (Bilateral)   3. Chronic lower extremity pain (1ry area of Pain) (Bilateral)    Pain Score: Pre-procedure: 6 /10 Post-procedure: 0-No pain/10   Pre-op H&P Assessment:  Ashley Young is a 80 y.o. (year old), female patient, seen today for interventional  treatment. She  has a past surgical history that includes Abdominal surgery; Appendectomy; Breast surgery; Cholecystectomy; Cataract extraction w/PHACO (Right, 06/05/2015); Kyphoplasty; Cesarean section; Colonoscopy; Esophagogastroduodenoscopy; Intramedullary (im) nail intertrochanteric (Left, 12/29/2016); Lithotripsy; Kyphoplasty; and Cataract extraction w/PHACO (Left, 04/15/2018). [name] has a current medication list which includes the following prescription(s): acetaminophen, aripiprazole, atorvastatin, calcium-vitamin d, duloxetine, furosemide, gabapentin, latanoprost, loperamide, multivitamin, myrbetriq, ondansetron, oyster calcium, pantoprazole, polyethylene glycol, potassium chloride, promethazine, sumatriptan, tramadol, trazodone, and vitamin b-12. Her primarily concern today is the Leg Pain and Hip Pain  Initial Vital Signs:  Pulse/HCG Rate: 70ECG Heart Rate: 72 Temp: (!) 97.1 F (36.2 C) Resp: 16 BP: (!) 98/55 SpO2: 100 %  BMI: Estimated body mass index is 20.9 kg/m as calculated from the following:   Height as of this encounter: 5\' 3"  (1.6 m).   Weight as of this encounter: 118 lb (53.5 kg).  Risk Assessment: Allergies: Reviewed. She is allergic to penicillins, aspirin, ferrous gluconate, iron polysaccharide, nsaids, other, sulfamethoxazole-trimethoprim, tolmetin, and venofer [iron sucrose].  Allergy Precautions: None required Coagulopathies: Reviewed. None identified.  Blood-thinner therapy: None at this time Active Infection(s): Reviewed. None identified. Ashley Young is afebrile  Site Confirmation: Ashley Young was asked to confirm the procedure and laterality before marking the site Procedure checklist: Completed Consent: Before the procedure and under the influence of no sedative(s), amnesic(s), or anxiolytics, the patient was informed of the treatment options, risks and possible complications. To fulfill our ethical and legal obligations, as recommended by the American  Medical Association's Code of Ethics, I have informed the patient of my clinical impression; the nature and purpose of the treatment or procedure; the risks, benefits, and possible complications of the intervention; the alternatives, including doing nothing; the risk(s) and benefit(s) of the alternative treatment(s)  or procedure(s); and the risk(s) and benefit(s) of doing nothing. The patient was provided information about the general risks and possible complications associated with the procedure. These may include, but are not limited to: failure to achieve desired goals, infection, bleeding, organ or nerve damage, allergic reactions, paralysis, and death. In addition, the patient was informed of those risks and complications associated to the procedure, such as failure to decrease pain; infection; bleeding; organ or nerve damage with subsequent damage to sensory, motor, and/or autonomic systems, resulting in permanent pain, numbness, and/or weakness of one or several areas of the body; allergic reactions; (i.e.: anaphylactic reaction); and/or death. Furthermore, the patient was informed of those risks and complications associated with the medications. These include, but are not limited to: allergic reactions (i.e.: anaphylactic or anaphylactoid reaction(s)); adrenal axis suppression; blood sugar elevation that in diabetics may result in ketoacidosis or comma; water retention that in patients with history of congestive heart failure may result in shortness of breath, pulmonary edema, and decompensation with resultant heart failure; weight gain; swelling or edema; medication-induced neural toxicity; particulate matter embolism and blood vessel occlusion with resultant organ, and/or nervous system infarction; and/or aseptic necrosis of one or more joints. Finally, the patient was informed that Medicine is not an exact science; therefore, there is also the possibility of unforeseen or unpredictable risks and/or  possible complications that may result in a catastrophic outcome. The patient indicated having understood very clearly. We have given the patient no guarantees and we have made no promises. Enough time was given to the patient to ask questions, all of which were answered to the patient's satisfaction. Ms. An has indicated that she wanted to continue with the procedure. Attestation: I, the ordering provider, attest that I have discussed with the patient the benefits, risks, side-effects, alternatives, likelihood of achieving goals, and potential problems during recovery for the procedure that I have provided informed consent. Date  Time: 12/05/2020 11:09 AM  Pre-Procedure Preparation:  Monitoring: As per clinic protocol. Respiration, ETCO2, SpO2, BP, heart rate and rhythm monitor placed and checked for adequate function Safety Precautions: Patient was assessed for positional comfort and pressure points before starting the procedure. Time-out: I initiated and conducted the "Time-out" before starting the procedure, as per protocol. The patient was asked to participate by confirming the accuracy of the "Time Out" information. Verification of the correct person, site, and procedure were performed and confirmed by me, the nursing staff, and the patient. "Time-out" conducted as per Joint Commission's Universal Protocol (UP.01.01.01). Time: 1211  Description of Procedure:          Safety Precautions: Aspiration looking for blood return was conducted prior to all injections. At no point did we inject any substances, as a needle was being advanced. No attempts were made at seeking any paresthesias. Safe injection practices and needle disposal techniques used. Medications properly checked for expiration dates. SDV (single dose vial) medications used. Description of the Procedure: Protocol guidelines were followed. The patient was placed in position over the fluoroscopy table. The target area was identified  and the area prepped in the usual manner. Skin & deeper tissues infiltrated with local anesthetic. Appropriate amount of time allowed to pass for local anesthetics to take effect. The procedure needles were then advanced to the target area. Proper needle placement secured. Negative aspiration confirmed. Solution injected in intermittent fashion, asking for systemic symptoms every 0.5cc of injectate. The needles were then removed and the area cleansed, making sure to leave some of the prepping  solution back to take advantage of its long term bactericidal properties. Vitals:   12/05/20 1212 12/05/20 1215 12/05/20 1220 12/05/20 1226  BP: (!) 113/55 (!) 103/53 (!) 100/57 (!) 104/56  Pulse:      Resp: Temp:      TempSrc:      SpO2: 97% 100% 100% 98%  Weight:      Height:        Start Time: 1211 hrs. End Time: 1224 hrs. Materials:  Needle(s) Type: Spinal Needle Gauge: 22G Length: 7.0-in Medication(s): Please see orders for medications and dosing details.  Imaging Guidance (Non-Spinal):          Type of Imaging Technique: Fluoroscopy Guidance (Non-Spinal) Indication(s): Assistance in needle guidance and placement for procedures requiring needle placement in or near specific anatomical locations not easily accessible without such assistance. Exposure Time: Please see nurses notes. Contrast: Before injecting any contrast, we confirmed that the patient did not have an allergy to iodine, shellfish, or radiological contrast. Once satisfactory needle placement was completed at the desired level, radiological contrast was injected. Contrast injected under live fluoroscopy. No contrast complications. See chart for type and volume of contrast used. Fluoroscopic Guidance: I was personally present during the use of fluoroscopy. "Tunnel Vision Technique" used to obtain the best possible view of the target area. Parallax error corrected before commencing the procedure. "Direction-depth-direction"  technique used to introduce the needle under continuous pulsed fluoroscopy. Once target was reached, antero-posterior, oblique, and lateral fluoroscopic projection used confirm needle placement in all planes. Images permanently stored in EMR. Interpretation: I personally interpreted the imaging intraoperatively. Adequate needle placement confirmed in multiple planes. Appropriate spread of contrast into desired area was observed. No evidence of afferent or efferent intravascular uptake. Permanent images saved into the patient's record.  Antibiotic Prophylaxis:   Anti-infectives (From admission, onward)   None     Indication(s): None identified  Post-operative Assessment:  Post-procedure Vital Signs:  Pulse/HCG Rate: 7070 Temp: (!) 97.1 F (36.2 C) Resp: 13 BP: (!) 104/56 SpO2: 98 %  EBL: None  Complications: No immediate post-treatment complications observed by team, or reported by patient.  Note: The patient tolerated the entire procedure well. A repeat set of vitals were taken after the procedure and the patient was kept under observation following institutional policy, for this type of procedure. Post-procedural neurological assessment was performed, showing return to baseline, prior to discharge. The patient was provided with post-procedure discharge instructions, including a section on how to identify potential problems. Should any problems arise concerning this procedure, the patient was given instructions to immediately contact us, at any time, without hesitation. In any case, we plan to contact the patient by telephone for a follow-up status report regarding this interventional procedure.  Comments:  No additional relevant information.  Plan of Care  Orders:  Orders Placed This Encounter  Procedures  . HIP INJECTION    Scheduling Instructions:     Side: Bilateral     Sedation: Patient's choice.     Timeframe: Today  . DG PAIN CLIN[facility] C-ARM 1-60 MIN NO REPORT     Intraoperative interpretation by procedural physician at [facility].    Standing Status:   Standing    Number of Occurrences:   1    Order Specific Question:   Reason for exam:    Answer:   Assistance in needle guidance and placement for procedures requiring needle placement in or near specific anatomical locations not easily accessible without  such assistance.  . Informed Consent Details: Physician/Practitioner Attestation; Transcribe to consent form and obtain patient signature    Nursing Order: Transcribe to consent form and obtain patient signature. Note: Always confirm laterality of pain with Ms. Haynie, before procedure.    Order Specific Question:   Physician/Practitioner attestation of informed consent for procedure/surgical case    Answer:   I, the physician/practitioner, attest that I have discussed with the patient the benefits, risks, side effects, alternatives, likelihood of achieving goals and potential problems during recovery for the procedure that I have provided informed consent.    Order Specific Question:   Procedure    Answer:   Hip injection    Order Specific Question:   Physician/Practitioner performing the procedure    Answer:   Keosha Rossa A. Laban EmperorNaveira, MD    Order Specific Question:   Indication/Reason    Answer:   Hip Joint Pain (Arthralgia)  . Provide equipment / supplies at bedside    "Block Tray" (Disposable  single use) Needle type: SpinalSpinal Amount/quantity: 2 Size: Long (7-inch) Gauge: 22G    Standing Status:   Standing    Number of Occurrences:   1    Order Specific Question:   Specify    Answer:   Block Tray   Chronic Opioid Analgesic:  Tramadol 50 mg tablet, 1 tab p.o. twice daily (100 mg/day of tramadol) MME/day: 10 mg/day   Medications ordered for procedure: Meds ordered this encounter  Medications  . iohexol (OMNIPAQUE) 180 MG/ML injection 10 mL    Must be Myelogram-compatible. If not available, you may substitute with a  water-soluble, non-ionic, hypoallergenic, myelogram-compatible radiological contrast medium.  Marland Kitchen. lidocaine (XYLOCAINE) 2 % (with pres) injection 400 mg  . methylPREDNISolone acetate (DEPO-MEDROL) injection 80 mg  . methylPREDNISolone acetate (DEPO-MEDROL) injection 80 mg  . ropivacaine (PF) 2 mg/mL (0.2%) (NAROPIN) injection 4 mL  . ropivacaine (PF) 2 mg/mL (0.2%) (NAROPIN) injection 4 mL   Medications administered: We administered iohexol, lidocaine, methylPREDNISolone acetate, methylPREDNISolone acetate, ropivacaine (PF) 2 mg/mL (0.2%), and ropivacaine (PF) 2 mg/mL (0.2%).  See the medical record for exact dosing, route, and time of administration.  Follow-up plan:   Return in about 2 weeks (around 12/19/2020) for (F2F), (40 min) 2V on evaluation day, in addition, (PPE).      Interventional Therapies  Risk  Complexity Considerations:   Estimated body mass index is 20.9 kg/m as calculated from the following:   Height as of this encounter: 5\' 3"  (1.6 m).   Weight as of this encounter: 118 lb (53.5 kg). WNL   Planned  Pending:   Pending further evaluation   Under consideration:   Therapeutic bilateral IA hip injection #2    Completed:   Diagnostic/therapeutic bilateral IA hip injection x1 (12/05/2020)    Therapeutic  Palliative (PRN) options:   None established    Recent Visits Date Type Provider Dept  11/23/20 Office Visit Delano MetzNaveira, Dareld Mcauliffe, MD [facility]  Showing recent visits within past 90 days and meeting all other requirements Today's Visits Date Type Provider Dept  12/05/20 Procedure visit Delano MetzNaveira, Mourad Cwikla, MD [facility]  Showing today's visits and meeting all other requirements Future Appointments Date Type Provider Dept  01/02/21 Appointment Delano MetzNaveira, Annisha Baar, MD [facility]  Showing future appointments within next 90 days and meeting all other requirements  Disposition: Discharge home  Discharge (Date  Time): 12/05/2020;  1231 hrs.   Primary Care Physician: Angela Coxasanayaka, Gayani Y, MD [facility] Note  by: Oswaldo Done, MD Date: 12/05/2020; Time: 12:37 PM  Disclaimer:  Medicine is not an Visual merchandiser. The only guarantee in medicine is that nothing is guaranteed. It is important to note that the decision to proceed with this intervention was based on the information collected from the patient. The Data and conclusions were drawn from the patient's questionnaire, the interview, and the physical examination. Because the information was provided in large part by the patient, it cannot be guaranteed that it has not been purposely or unconsciously manipulated. Every effort has been made to obtain as much relevant data as possible for this evaluation. It is important to note that the conclusions that lead to this procedure are derived in large part from the available data. Always take into account that the treatment will also be dependent on availability of resources and existing treatment guidelines, considered by other Pain Management Practitioners as being common knowledge and practice, at the time of the intervention. For Medico-Legal purposes, it is also important to point out that variation in procedural techniques and pharmacological choices are the acceptable norm. The indications, contraindications, technique, and results of the above procedure should only be interpreted and judged by a Board-Certified Interventional Pain Specialist with extensive familiarity and expertise in the same exact procedure and technique.

## 2020-12-06 ENCOUNTER — Telehealth: Payer: Self-pay

## 2020-12-06 NOTE — Telephone Encounter (Signed)
Post procedure phone call.  LM 

## 2021-01-02 ENCOUNTER — Encounter: Payer: Self-pay | Admitting: Pain Medicine

## 2021-01-02 ENCOUNTER — Ambulatory Visit: Payer: Medicare PPO | Attending: Pain Medicine | Admitting: Pain Medicine

## 2021-01-02 ENCOUNTER — Other Ambulatory Visit: Payer: Self-pay

## 2021-01-02 VITALS — BP 98/64 | HR 86 | Temp 97.1°F | Ht 63.0 in | Wt 110.0 lb

## 2021-01-02 DIAGNOSIS — M5442 Lumbago with sciatica, left side: Secondary | ICD-10-CM | POA: Diagnosis not present

## 2021-01-02 DIAGNOSIS — M5441 Lumbago with sciatica, right side: Secondary | ICD-10-CM | POA: Insufficient documentation

## 2021-01-02 DIAGNOSIS — G8929 Other chronic pain: Secondary | ICD-10-CM | POA: Insufficient documentation

## 2021-01-02 DIAGNOSIS — M79604 Pain in right leg: Secondary | ICD-10-CM | POA: Diagnosis not present

## 2021-01-02 DIAGNOSIS — M25551 Pain in right hip: Secondary | ICD-10-CM | POA: Diagnosis not present

## 2021-01-02 DIAGNOSIS — M79605 Pain in left leg: Secondary | ICD-10-CM | POA: Diagnosis present

## 2021-01-02 DIAGNOSIS — M25552 Pain in left hip: Secondary | ICD-10-CM | POA: Diagnosis present

## 2021-01-02 DIAGNOSIS — M5136 Other intervertebral disc degeneration, lumbar region: Secondary | ICD-10-CM | POA: Diagnosis not present

## 2021-01-02 NOTE — Patient Instructions (Addendum)
____________________________________________________________________________________________  Preparing for your procedure (without sedation)  Procedure appointments are limited to planned procedures: . No Prescription Refills. . No disability issues will be discussed. . No medication changes will be discussed.  Instructions: . Oral Intake: Do not eat or drink anything for at least 6 hours prior to your procedure. (Exception: Blood Pressure Medication. See below.) . Transportation: Unless otherwise stated by your physician, you may drive yourself after the procedure. . Blood Pressure Medicine: Do not forget to take your blood pressure medicine with a sip of water the morning of the procedure. If your Diastolic (lower reading)is above 100 mmHg, elective cases will be cancelled/rescheduled. . Blood thinners: These will need to be stopped for procedures. Notify our staff if you are taking any blood thinners. Depending on which one you take, there will be specific instructions on how and when to stop it. . Diabetics on insulin: Notify the staff so that you can be scheduled 1st case in the morning. If your diabetes requires high dose insulin, take only  of your normal insulin dose the morning of the procedure and notify the staff that you have done so. . Preventing infections: Shower with an antibacterial soap the morning of your procedure.  . Build-up your immune system: Take 1000 mg of Vitamin C with every meal (3 times a day) the day prior to your procedure. . Antibiotics: Inform the staff if you have a condition or reason that requires you to take antibiotics before dental procedures. . Pregnancy: If you are pregnant, call and cancel the procedure. . Sickness: If you have a cold, fever, or any active infections, call and cancel the procedure. . Arrival: You must be in the facility at least 30 minutes prior to your scheduled procedure. . Children: Do not bring any children with you. . Dress  appropriately: Bring dark clothing that you would not mind if they get stained. . Valuables: Do not bring any jewelry or valuables.  Reasons to call and reschedule or cancel your procedure: (Following these recommendations will minimize the risk of a serious complication.) . Surgeries: Avoid having procedures within 2 weeks of any surgery. (Avoid for 2 weeks before or after any surgery). . Flu Shots: Avoid having procedures within 2 weeks of a flu shots or . (Avoid for 2 weeks before or after immunizations). . Barium: Avoid having a procedure within 7-10 days after having had a radiological study involving the use of radiological contrast. (Myelograms, Barium swallow or enema study). . Heart attacks: Avoid any elective procedures or surgeries for the initial 6 months after a "Myocardial Infarction" (Heart Attack). . Blood thinners: It is imperative that you stop these medications before procedures. Let us know if you if you take any blood thinner.  . Infection: Avoid procedures during or within two weeks of an infection (including chest colds or gastrointestinal problems). Symptoms associated with infections include: Localized redness, fever, chills, night sweats or profuse sweating, burning sensation when voiding, cough, congestion, stuffiness, runny nose, sore throat, diarrhea, nausea, vomiting, cold or Flu symptoms, recent or current infections. It is specially important if the infection is over the area that we intend to treat. . Heart and lung problems: Symptoms that may suggest an active cardiopulmonary problem include: cough, chest pain, breathing difficulties or shortness of breath, dizziness, ankle swelling, uncontrolled high or unusually low blood pressure, and/or palpitations. If you are experiencing any of these symptoms, cancel your procedure and contact your primary care physician for an evaluation.  Remember:  Regular   Business hours are:  Monday to Thursday 8:00 AM to 4:00  PM  Provider's Schedule: Milinda Pointer, MD:  Procedure days: Tuesday and Thursday 7:30 AM to 4:00 PM  Gillis Santa, MD:  Procedure days: Monday and Wednesday 7:30 AM to 4:00 PM ____________________________________________________________________________________________   ____________________________________________________________________________________________  General Risks and Possible Complications  Patient Responsibilities: It is important that you read this as it is part of your informed consent. It is our duty to inform you of the risks and possible complications associated with treatments offered to you. It is your responsibility as a patient to read this and to ask questions about anything that is not clear or that you believe was not covered in this document.  Patient's Rights: You have the right to refuse treatment. You also have the right to change your mind, even after initially having agreed to have the treatment done. However, under this last option, if you wait until the last second to change your mind, you may be charged for the materials used up to that point.  Introduction: Medicine is not an Chief Strategy Officer. Everything in Medicine, including the lack of treatment(s), carries the potential for danger, harm, or loss (which is by definition: Risk). In Medicine, a complication is a secondary problem, condition, or disease that can aggravate an already existing one. All treatments carry the risk of possible complications. The fact that a side effects or complications occurs, does not imply that the treatment was conducted incorrectly. It must be clearly understood that these can happen even when everything is done following the highest safety standards.  No treatment: You can choose not to proceed with the proposed treatment alternative. The "PRO(s)" would include: avoiding the risk of complications associated with the therapy. The "CON(s)" would include: not getting any of the  treatment benefits. These benefits fall under one of three categories: diagnostic; therapeutic; and/or palliative. Diagnostic benefits include: getting information which can ultimately lead to improvement of the disease or symptom(s). Therapeutic benefits are those associated with the successful treatment of the disease. Finally, palliative benefits are those related to the decrease of the primary symptoms, without necessarily curing the condition (example: decreasing the pain from a flare-up of a chronic condition, such as incurable terminal cancer).  General Risks and Complications: These are associated to most interventional treatments. They can occur alone, or in combination. They fall under one of the following six (6) categories: no benefit or worsening of symptoms; bleeding; infection; nerve damage; allergic reactions; and/or death. 1. No benefits or worsening of symptoms: In Medicine there are no guarantees, only probabilities. No healthcare provider can ever guarantee that a medical treatment will work, they can only state the probability that it may. Furthermore, there is always the possibility that the condition may worsen, either directly, or indirectly, as a consequence of the treatment. 2. Bleeding: This is more common if the patient is taking a blood thinner, either prescription or over the counter (example: Goody Powders, Fish oil, Aspirin, Garlic, etc.), or if suffering a condition associated with impaired coagulation (example: Hemophilia, cirrhosis of the liver, low platelet counts, etc.). However, even if you do not have one on these, it can still happen. If you have any of these conditions, or take one of these drugs, make sure to notify your treating physician. 3. Infection: This is more common in patients with a compromised immune system, either due to disease (example: diabetes, cancer, human immunodeficiency virus [HIV], etc.), or due to medications or treatments (example: therapies used  to treat cancer and rheumatological diseases). However, even if you do not have one on these, it can still happen. If you have any of these conditions, or take one of these drugs, make sure to notify your treating physician. 4. Nerve Damage: This is more common when the treatment is an invasive one, but it can also happen with the use of medications, such as those used in the treatment of cancer. The damage can occur to small secondary nerves, or to large primary ones, such as those in the spinal cord and brain. This damage may be temporary or permanent and it may lead to impairments that can range from temporary numbness to permanent paralysis and/or brain death. 5. Allergic Reactions: Any time a substance or material comes in contact with our body, there is the possibility of an allergic reaction. These can range from a mild skin rash (contact dermatitis) to a severe systemic reaction (anaphylactic reaction), which can result in death. 6. Death: In general, any medical intervention can result in death, most of the time due to an unforeseen complication. ____________________________________________________________________________________________  ____________________________________________________________________________________________  Preparing for your procedure (without sedation)  Procedure appointments are limited to planned procedures: . No Prescription Refills. . No disability issues will be discussed. . No medication changes will be discussed.  Instructions: . Oral Intake: Do not eat or drink anything for at least 6 hours prior to your procedure. (Exception: Blood Pressure Medication. See below.) . Transportation: Unless otherwise stated by your physician, you may drive yourself after the procedure. . Blood Pressure Medicine: Do not forget to take your blood pressure medicine with a sip of water the morning of the procedure. If your Diastolic (lower reading)is above 100 mmHg, elective  cases will be cancelled/rescheduled. . Blood thinners: These will need to be stopped for procedures. Notify our staff if you are taking any blood thinners. Depending on which one you take, there will be specific instructions on how and when to stop it. . Diabetics on insulin: Notify the staff so that you can be scheduled 1st case in the morning. If your diabetes requires high dose insulin, take only  of your normal insulin dose the morning of the procedure and notify the staff that you have done so. . Preventing infections: Shower with an antibacterial soap the morning of your procedure.  . Build-up your immune system: Take 1000 mg of Vitamin C with every meal (3 times a day) the day prior to your procedure. Marland Kitchen Antibiotics: Inform the staff if you have a condition or reason that requires you to take antibiotics before dental procedures. . Pregnancy: If you are pregnant, call and cancel the procedure. . Sickness: If you have a cold, fever, or any active infections, call and cancel the procedure. . Arrival: You must be in the facility at least 30 minutes prior to your scheduled procedure. . Children: Do not bring any children with you. . Dress appropriately: Bring dark clothing that you would not mind if they get stained. . Valuables: Do not bring any jewelry or valuables.  Reasons to call and reschedule or cancel your procedure: (Following these recommendations will minimize the risk of a serious complication.) . Surgeries: Avoid having procedures within 2 weeks of any surgery. (Avoid for 2 weeks before or after any surgery). . Flu Shots: Avoid having procedures within 2 weeks of a flu shots or . (Avoid for 2 weeks before or after immunizations). . Barium: Avoid having a procedure within 7-10 days after having had a radiological  study involving the use of radiological contrast. (Myelograms, Barium swallow or enema study). . Heart attacks: Avoid any elective procedures or surgeries for the initial 6  months after a "Myocardial Infarction" (Heart Attack). . Blood thinners: It is imperative that you stop these medications before procedures. Let us know if you if you take any blood thinner.  . Infection: Avoid procedures during or within two weeks of an infection (including chest colds or gastrointestinal problems). Symptoms associated with infections include: Localized redness, fever, chills, night sweats or profuse sweating, burning sensation when voiding, cough, congestion, stuffiness, runny nose, sore throat, diarrhea, nausea, vomiting, cold or Flu symptoms, recent or current infections. It is specially important if the infection is over the area that we intend to treat. Marland Kitchen Heart and lung problems: Symptoms that may suggest an active cardiopulmonary problem include: cough, chest pain, breathing difficulties or shortness of breath, dizziness, ankle swelling, uncontrolled high or unusually low blood pressure, and/or palpitations. If you are experiencing any of these symptoms, cancel your procedure and contact your primary care physician for an evaluation.  Remember:  Regular Business hours are:  Monday to Thursday 8:00 AM to 4:00 PM  Provider's Schedule: Delano Metz, MD:  Procedure days: Tuesday and Thursday 7:30 AM to 4:00 PM  Edward Jolly, MD:  Procedure days: Monday and Wednesday 7:30 AM to 4:00 PM ____________________________________________________________________________________________

## 2021-01-02 NOTE — Progress Notes (Addendum)
PROVIDER NOTE: Information contained herein reflects review and annotations entered in association with encounter. Interpretation of such information and data should be left to medically-trained personnel. Information provided to patient can be located elsewhere in the medical record under "Patient Instructions". Document created using STT-dictation technology, any transcriptional errors that may result from process are unintentional.    Patient: Ashley Young  Service Category: E/M  Provider: Gaspar Cola, MD  DOB: 1940/12/01  DOS: 01/02/2021  Specialty: Interventional Pain Management  MRN: [id number]  Setting: Ambulatory outpatient  PCP: Merlene Laughter, MD  Type: Established Patient    Referring Provider: Merlene Laughter, MD  Location: Office  Delivery: Face-to-face     Primary Reason(s) for Visit: Encounter for evaluation before starting new chronic pain management plan of care (Level of risk: moderate) CC: Back Pain  HPI  Ashley Young is a 80 y.o. year old, female patient, who comes today for a follow-up evaluation to review the test results and decide on a treatment plan. She has Abdominal pain; Acquired gastric outlet stenosis; Adverse effect of salicylate; Anxiety state; DDD (degenerative disc disease), lumbar; Anxiety and depression; Clinical depression; Esophagitis; H/O gastrointestinal hemorrhage; H/O gastric ulcer; Bilateral lower extremity edema; Disorder of nutrition; Headache, migraine; Calculus of kidney; Osteoporosis, post-menopausal; OP (osteoporosis); Avitaminosis D; B12 deficiency; Iron deficiency anemia due to chronic blood loss; Borderline diabetes mellitus; Chronic systolic heart failure ([place]); Degeneration of intervertebral disc of lumbar region; History of anemia; Pure hypercholesterolemia; Recurrent major depression in remission ([place]); Goals of care, counseling/discussion; Severe recurrent major depression without psychotic features ([place]); Failure to thrive  (0-17); Hypokalemia; Hip fracture, left ([place]); Palliative care encounter; DNR (do not resuscitate); Mood disorder ([place]); Major depressive disorder, single episode, unspecified; Abnormal gait; Abnormal levels of other serum enzymes; Allergic rhinitis; Closed fracture of ischium ([place]); Alzheimer's disease ([place]); Confusion; Dementia ([place]); Congestive heart failure ([place]); Difficulty sleeping; History of cataract extraction; Hyperlipidemia; Hypocalcemia; Idiopathic peripheral autonomic neuropathy; Impaired glucose tolerance; Insomnia; Leg cramping; Chronic lower extremity pain (1ry area of Pain) (Bilateral); Other elevated white blood cell count; Leukocytosis; Macrocytosis; Malnourished ([place]); Memory loss or impairment; Mild recurrent major depression ([place]); Osteoarthritis; Other fatigue; Pulmonary hypertension, mild ([place]); Restless legs syndrome; Thrombocytosis; Urge incontinence of urine; Urinary incontinence; Vaccine counseling; 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[place]); 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 [facility]); Vitamin D deficiency; Chronic systolic CHF (congestive heart failure) ([place]); Gastroesophageal reflux disease; Encounter for general adult medical examination without abnormal findings; Migraine; History of microcytic hypochromic anemia; Iron deficiency anemia; Chronic pain syndrome; Pharmacologic therapy; Disorder of skeletal system; Problems influencing health status; Chronic hip pain (Bilateral); Chronic hand pain (Bilateral); Ulnar deviation of fingers of hands (Bilateral); 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 Her primarily concern today is the Back Pain  Pain Assessment: Location:   Back Radiating: radiates down back of legs to knees Onset: More than a month ago Duration: Chronic pain Quality: Aching, Shooting, Sharp, Constant Severity: 5 /10 (subjective, self-reported pain score)  Effect on ADL: prolonged walking and standing, sitting  for long periods Timing: Constant Modifying factors: "Nothing: BP: 98/64  HR: 86  Ashley Young comes in today for a follow-up visit after her initial evaluation on 12/06/2020. Today we went over the results of her tests. These were explained in "Layman's terms". During today's appointment we went over my diagnostic impression, as well as the proposed treatment plan.  The patient comes into the clinic today accompanied by her husband and  caregiver, who is up to speed with her situation and needs.  Today's information was provided by both of them.  According to the patient the primary area of pain is that of the lower extremities (Bilateral) with the right being equal to the left.  In the case of both lower extremities the pain appears to start in the buttocks area and travel through the posterior lateral aspect of the leg over the hips and to a certain extent over the lateral aspect of the thighs going all the way down into her feet.  When the pain gets into her feet it seems to be over the top of the foot and the bottom of the foot (L5 and S1 dermatomes respectively) on the right foot and over the entire foot on the left side.   The patient's secondary area pain is that of the lower back (Bilateral).  This pain is mainly in the very lower portion of the back, over the buttocks area.   The patient's third area pain is that of her hands (Bilateral).  She seems to have ulnar deviation of all of her fingers in both of her hands with decreased range of motion on her right hand where she is unable to completely open the hand.  A lot of this seems to be arthritic in nature.   The patient's husband indicated that she is having some difficulty sleeping at night and this is associated with a combination of the pain and poor sleep hygiene.  Today I took the time to go over what proper sleep hygiene would be and what they are supposed to do.   Physical exam: The patient comes in in a wheelchair, but she was able  to stand up and move onto the bed.  She has a shuffling gait and decreased range of motion of the lumbar spine as well as having a positive Patrick maneuver for bilateral hip joint arthralgia with significant bilateral decreased range of motion of the hips.  The pain and the decrease of motion seem to be worse on the left side.  She also presents with decreased range of motion and ulnar deviation of all of her fingers, especially on the right side.  Based on the results of today's postprocedure evaluation, she seems to have not attain any long-term benefit from the bilateral intra-articular hip joint injection.  Because she has pathology at lower levels, there is a possibility that she may be having issues coming from her lumbar spine.  1 thing that the husband has noticed this that most of her pain is when she stands up.  However, when she is sitting down he refers that she has no pains.  This also goes along with spinal stenosis.  Post-Procedure Evaluation  Procedure (12/05/2020): Diagnostic bilateral IA hip joint injection #1 under fluoroscopic guidance, no sedation Pre-procedure pain level: 6/10 Post-procedure: 0/10 (100% relief)  Sedation: None.  Effectiveness during initial hour after procedure(Ultra-Short Term Relief): 100 %.  Local anesthetic used: Long-acting (4-6 hours) Effectiveness: Defined as any analgesic benefit obtained secondary to the administration of local anesthetics. This carries significant diagnostic value as to the etiological location, or anatomical origin, of the pain. Duration of benefit is expected to coincide with the duration of the local anesthetic used.  Effectiveness during initial 4-6 hours after procedure(Short-Term Relief): 50 %.  Long-term benefit: Defined as any relief past the pharmacologic duration of the local anesthetics.  Effectiveness past the initial 6 hours after procedure(Long-Term Relief): 0 %.  Current benefits:  Defined as benefit that persist at  this time.   Analgesia:   Very difficult to assess secondary to the patient's dementia.  Information we get it usually from her husband but even he has difficulty figuring out if this has provided her with any long-term benefit.  He refers that he thinks she did not get any long-term relief from this. Function: No improvement ROM: No improvement  Controlled Substance Pharmacotherapy Assessment REMS (Risk Evaluation and Mitigation Strategy)  Analgesic: Tramadol 50 mg tablet, 1 tab p.o. twice daily (100 mg/day of tramadol) MME/day: 10 mg/day  Pill Count: None expected due to no prior prescriptions written by our practice. Ignatius Specking, RN  01/03/2021  2:45 PM  Signed Safety precautions to be maintained throughout the outpatient stay will include: orient to surroundings, keep bed in low position, maintain call bell within reach at all times, provide assistance with transfer out of bed and ambulation.     Pharmacokinetics: Liberation and absorption (onset of action): WNL Distribution (time to peak effect): WNL Metabolism and excretion (duration of action): WNL         Pharmacodynamics: Desired effects: Analgesia: Ashley Young reports >50% benefit. Functional ability: Patient reports that medication allows her to accomplish basic ADLs Clinically meaningful improvement in function (CMIF): Sustained CMIF goals met Perceived effectiveness: Described as relatively effective, allowing for increase in activities of daily living (ADL) Undesirable effects: Side-effects or Adverse reactions: None reported Monitoring: [place] PMP: PDMP reviewed during this encounter. Online review of the past 37-monthperiod previously conducted. Not applicable at this point since we have not taken over the patient's medication management yet. List of other Serum/Urine Drug Screening Test(s):  No results found for: AMPHSCRSER, BARBSCRSER, BENZOSCRSER, COCAINSCRSER, COCAINSCRNUR, [place] CANNABQU[place],  [place] EGrand LedgeList of all UDS test(s) done:  Lab Results  Component Value Date   SUMMARY Note 11/23/2020   Last UDS on record: Summary  Date Value Ref Range Status  11/23/2020 Note  Final    Comment:    ==================================================================== Compliance Drug Analysis, Ur ==================================================================== Test                             Result       Flag       Units  Drug Present and Declared for Prescription Verification   Gabapentin                     PRESENT      EXPECTED   Duloxetine                     PRESENT      EXPECTED   Trazodone                      PRESENT      EXPECTED   1,3 chlorophenyl piperazine    PRESENT      EXPECTED    1,3-chlorophenyl piperazine is an expected metabolite of trazodone.    Aripiprazole                   PRESENT      EXPECTED   Acetaminophen                  PRESENT      EXPECTED  Drug Present not Declared for Prescription Verification   Doxylamine  PRESENT      UNEXPECTED  Drug Absent but Declared for Prescription Verification   Tramadol                       Not Detected UNEXPECTED ng/mg creat   Promethazine                   Not Detected UNEXPECTED ==================================================================== Test                      Result    Flag   Units      Ref Range   Creatinine              98               mg/dL      >=20 ==================================================================== Declared Medications:  The flagging and interpretation on this report are based on the  following declared medications.  Unexpected results may arise from  inaccuracies in the declared medications.   **Note: The testing scope of this panel includes these medications:   Aripiprazole (Abilify)  Duloxetine (Cymbalta)  Gabapentin (Neurontin)  Promethazine (Phenergan)  Tramadol (Ultram)  Trazodone (Desyrel)   **Note: The  testing scope of this panel does not include small to  moderate amounts of these reported medications:   Acetaminophen (Tylenol)   **Note: The testing scope of this panel does not include the  following reported medications:   Atorvastatin (Lipitor)  Calcium  Cyanocobalamin  Furosemide (Lasix)  Latanoprost (Xalatan)  Loperamide  Mirabegron (Myrbetriq)  Multivitamin  Ondansetron (Zofran)  Pantoprazole (Protonix)  Polyethylene Glycol (MiraLAX)  Potassium (Klor-Con)  Sumatriptan (Imitrex)  Vitamin D ==================================================================== For clinical consultation, please call [phone]. ====================================================================    UDS interpretation: No unexpected findings.          Medication Assessment Form: Not applicable. Treatment compliance: Not applicable Risk Assessment Profile: Aberrant behavior: See initial evaluations. None observed or detected today Comorbid factors increasing risk of overdose: See initial evaluation. No additional risks detected today Opioid risk tool (ORT):  No flowsheet data found.  ORT Scoring interpretation table:  Score <3 = Low Risk for SUD  Score between 4-7 = Moderate Risk for SUD  Score >8 = High Risk for Opioid Abuse   Risk of substance use disorder (SUD): Low  Risk Mitigation Strategies:  Patient opioid safety counseling: Not applicable. Patient-Prescriber Agreement (PPA): No agreement signed.  Controlled substance notification to other providers: Not applicable  Pharmacologic Plan: No opioid analgesic prescribed.             Laboratory Chemistry Profile   Renal Lab Results  Component Value Date   BUN 19 11/23/2020   CREATININE 0.54 11/23/2020   GFRAA >60 02/04/2020   GFRNONAA >60 11/23/2020   SPECGRAV 1.015 01/10/2015   PHUR 5.5 01/10/2015   PROTEINUR NEGATIVE 09/08/2019     Electrolytes Lab Results  Component Value Date   NA 139 11/23/2020   K 4.5  11/23/2020   CL 104 11/23/2020   CALCIUM 9.3 11/23/2020   MG 2.2 11/23/2020   PHOS 3.9 12/31/2016     Hepatic Lab Results  Component Value Date   AST 24 11/23/2020   ALT 28 11/23/2020   ALBUMIN 3.6 11/23/2020   ALKPHOS 101 11/23/2020     ID Lab Results  Component Value Date   STAPHAUREUS NEGATIVE 12/29/2016   MRSAPCR NEGATIVE 12/29/2016     Bone Lab Results  Component Value Date  VD25OH 36.38 11/23/2020     Endocrine Lab Results  Component Value Date   GLUCOSE 95 11/23/2020   GLUCOSEU NEGATIVE 09/08/2019   TSH 7.096 (H) 11/09/2014     Neuropathy Lab Results  Component Value Date   VITAMINB12 4,081 (H) 11/23/2020   FOLATE 8.3 04/24/2015     CNS No results found for: COLORCSF, APPEARCSF, RBCCOUNTCSF, WBCCSF, POLYSCSF, LYMPHSCSF, EOSCSF, PROTEINCSF, GLUCCSF, JCVIRUS, CSFOLI, IGGCSF, LABACHR, ACETBL, LABACHR, ACETBL   Inflammation (CRP: Acute  ESR: Chronic) Lab Results  Component Value Date   CRP 0.6 11/23/2020   ESRSEDRATE 9 11/23/2020   LATICACIDVEN 1.7 11/09/2014     Rheumatology Lab Results  Component Value Date   RF <10.0 11/23/2020     Coagulation Lab Results  Component Value Date   INR 0.9 05/31/2013   LABPROT 12.7 05/31/2013   APTT 29.9 03/31/2013   PLT 232 09/08/2020     Cardiovascular Lab Results  Component Value Date   CKTOTAL 255 (H) 09/08/2019   CKMB 2.9 05/31/2013   TROPONINI <0.03 10/20/2017   HGB 14.3 09/08/2020   HCT 43.8 09/08/2020     Screening Lab Results  Component Value Date   STAPHAUREUS NEGATIVE 12/29/2016   MRSAPCR NEGATIVE 12/29/2016     Cancer No results found for: CEA, CA125, LABCA2   Allergens No results found for: ALMOND, APPLE, ASPARAGUS, AVOCADO, BANANA, BARLEY, BASIL, BAYLEAF, GREENBEAN, LIMABEAN, WHITEBEAN, BEEFIGE, REDBEET, BLUEBERRY, BROCCOLI, CABBAGE, MELON, CARROT, CASEIN, CASHEWNUT, CAULIFLOWER, CELERY     Note: Lab results reviewed.  Recent Diagnostic Imaging Review  Cervical  Imaging: Cervical CT wo contrast: Results for orders placed during the hospital encounter of 09/08/19 CT CERVICAL SPINE WO CONTRAST  Narrative CLINICAL DATA:  History of neck trauma, presents from home status post fall.  EXAM: CT HEAD WITHOUT CONTRAST  CT CERVICAL SPINE WITHOUT CONTRAST  TECHNIQUE: Multidetector CT imaging of the head and cervical spine was performed following the standard protocol without intravenous contrast. Multiplanar CT image reconstructions of the cervical spine were also generated.  COMPARISON:  09/19/2017  FINDINGS: CT HEAD FINDINGS  Brain: No evidence of acute infarction, hemorrhage, hydrocephalus, extra-axial collection or mass lesion/mass effect.  Signs of atrophy and microvascular ischemic change as before.  Vascular: No hyperdense vessel or unexpected calcification.  Skull: Normal. Negative for fracture or focal lesion.  Sinuses/Orbits: Visualized paranasal sinuses and orbits are unremarkable.  Other: None  CT CERVICAL SPINE FINDINGS  Alignment: Mild anterolisthesis of C3 on C4 approximately 2-3 mm unchanged since 2015.  Skull base and vertebrae: No acute fracture. No primary bone lesion or focal pathologic process.  Soft tissues and spinal canal: No prevertebral fluid or swelling. No visible canal hematoma.  Disc levels: Multilevel degenerative change greatest at C4-5 and C5-6 as well C6-7 with uncovertebral degenerative changes and facet arthropathy disc space narrowing at these levels and degenerative changes worse than in 2015. Atlantoaxial degenerative changes also noted unchanged from previous exam.  Upper chest: Biapical pleural and parenchymal scarring.  Other: None  IMPRESSION: 1. No evidence for acute traumatic intracranial injury. 2. No evidence for acute fracture or malalignment of the cervical spine. 3. Worsening degenerative change compared to prior imaging in the cervical spine.   Electronically  Signed By: Zetta Bills M.D. On: 09/08/2019 09:27  Lumbosacral Imaging: Lumbar CT w contrast: Results for orders placed in visit on 05/03/03 CT Lumbar Spine W Contrast  Narrative FINDINGS CLINICAL DATA:  BACK PAIN. LUMBAR MYELOGRAM LUMBAR PUNCTURE WAS PERFORMED BY DR. CABBELL AND AN APPROPRIATE  AMOUNT OF OMNIPAQUE 180 WAS INSTILLED IN THE SUBARACHNOID SPACE.  AP, LATERAL AND OBLIQUE VIEWS SHOW NO NERVE ROOT ENCROACHMENT, SPINAL STENOSIS OR EXTRADURAL DEFECT. POST MYELOGRAM CT THE LOWER DISC SPACES ARE EXAMINED. L2-3:  NORMAL INTERSPACE. L3-4:  NORMAL INTERSPACE. L4-5:  NORMAL INTERSPACE. L5-S1:  SMALL RIGHT PARACENTRAL PROTRUSION WHICH IS PARTIALLY CALCIFIED.  MILD DORSAL DISPLACEMENT OF THE RIGHT S1 ROOT IS SEEN. IMPRESSION 1.  SMALL RIGHT PARACENTRAL DISC PROTRUSION WITH CALCIFICATION; MILD DORSAL DISPLACEMENT OF THE RIGHT S1 NERVE ROOT.  Lumbar DG Bending views: Results for orders placed during the hospital encounter of 11/23/20 DG Lumbar Spine Complete W/Bend  Narrative CLINICAL DATA:  Chronic lumbosacral back pain. Chronic bilateral low back pain with bilateral sciatica.  EXAM: LUMBAR SPINE - COMPLETE WITH BENDING VIEWS  COMPARISON:  Radiograph 04/15/2013, lumbar MRI 09/24/2013  FINDINGS: Six lumbar-type vertebral bodies, consistent with prior MRI numbering, lower most lumbar vertebra will be labeled L5. Previous L5 vertebral plasty. The bones are diffusely under mineralized. Other than remote L5 compression fracture, lumbar vertebral body heights are preserved. No significant disc space narrowing. Limited range of motion on flexion and extension imaging, but no evidence of instability. Mild L4-L5 and L5-S1 facet hypertrophy. Surgical sutures is well as stent in the left upper quadrant of the abdomen. Additional surgical clips. Bilateral intrarenal calculi.  IMPRESSION: 1. Six lumbar-type vertebral bodies. Consistent with prior MRI numbering, lower most  lumbar vertebra will be labeled L5. 2. Remote L5 compression fracture with kyphoplasty. 3. L4-L5 and L5-S1 facet hypertrophy. 4. Diffuse bony under mineralization with osteopenia/osteoporosis.   Electronically Signed By: Keith Rake M.D. On: 11/26/2020 17:10        Lumbar DG Myelogram views: Results for orders placed in visit on 05/03/03 DG Myelogram Lumbar  Narrative FINDINGS CLINICAL DATA:  BACK PAIN. LUMBAR MYELOGRAM LUMBAR PUNCTURE WAS PERFORMED BY DR. CABBELL AND AN APPROPRIATE AMOUNT OF OMNIPAQUE 180 WAS INSTILLED IN THE SUBARACHNOID SPACE.  AP, LATERAL AND OBLIQUE VIEWS SHOW NO NERVE ROOT ENCROACHMENT, SPINAL STENOSIS OR EXTRADURAL DEFECT. POST MYELOGRAM CT THE LOWER DISC SPACES ARE EXAMINED. L2-3:  NORMAL INTERSPACE. L3-4:  NORMAL INTERSPACE. L4-5:  NORMAL INTERSPACE. L5-S1:  SMALL RIGHT PARACENTRAL PROTRUSION WHICH IS PARTIALLY CALCIFIED.  MILD DORSAL DISPLACEMENT OF THE RIGHT S1 ROOT IS SEEN. IMPRESSION 1.  SMALL RIGHT PARACENTRAL DISC PROTRUSION WITH CALCIFICATION; MILD DORSAL DISPLACEMENT OF THE RIGHT S1 NERVE ROOT.  Lumbar DG Epidurogram OP: Results for orders placed during the hospital encounter of 05/22/10 DG Epidurography  Narrative EPIDUROGRAM S+I  Clinical Data:   Displacement of lumbar intervertebral disc without myelopathy.  Left leg sciatica.  Little improvement from the first injection.  I went at the level above.  Procedure: Left L4-L5 Interlaminar Epidural Steroid Injection  Fully informed written consent was obtained on the first visit. The patient was placed prone on the fluoroscopic table,  the L4-L5 interspace was visualized, and the skin was marked.  After thorough povidone-iodine scrubbing, the site was draped in sterile fashion. 1% lidocaine was used to anesthetize the skin and deeper soft tissues.  A 20-gauge Crawford epidural needle was inserted and advanced into the epidural space using loss of resistance technique with normal  saline.  The epidural space was identified on first pass without evidence of blood, cerebrospinal fluid, or paresthesias.  Needle tip placement within the epidural space was confirmed using 1 mL of non-ionic contrast.  There was no intravascular or subarachnoid opacification.  Next, 1109m of Depo- Medrol was administered and flushed using 2 mL  of normal saline. The needle was restyletted and withdrawn.  The patient tolerated the procedure well and there was no evidence of procedural complication.  The patient was able to ambulate to the recovery area and observed for an appropriate length of time as outlined on the nursing notes. The patient was discharged home with a driver in good condition with detailed patient instructions.  Fluoroscopy time:  11 seconds  IMPRESSION:  Satisfactory interlaminar lumbar epidural steroid injection, left L4-L5, #2.  Provider: Ceasar Lund   Results for orders placed during the hospital encounter of 05/01/10 DG Epidurography  Narrative CLINICAL DATA:  Lumbosacral spondylosis without myelopathy.  Left lower extremity radiculitis.  LUMBAR EPIDURAL INJECTION: An interlaminar approach was performed on the left at L5-S1.  The overlying skin was cleansed and anesthetized.  A 20 gauge spinal needle was advanced using loss-of-resistance technique.  Injection of 2cc of Omnipaque 180 confirmed epidural placement.  There was no evidence for intravascular or intrathecal spread of contrast.  I then injected 120 mg of Depo-Medrol and 43m of 1% lidocaine.  The patient tolerated the procedure without evidence for complication. The patient  was observed for 20 minutes prior to discharge in stable neurologic condition.  FLUORO TIME:  29 seconds  IMPRESSIONS:  Technically successful first interlaminar epidural steroid injection on the left at L5-S1.  Provider: KCandis Schatz Spine Imaging: Epidurography 1: Results for orders placed during the  hospital encounter of 05/22/10 DG Epidurography  Narrative EPIDUROGRAM S+I  Clinical Data:   Displacement of lumbar intervertebral disc without myelopathy.  Left leg sciatica.  Little improvement from the first injection.  I went at the level above.  Procedure: Left L4-L5 Interlaminar Epidural Steroid Injection  Fully informed written consent was obtained on the first visit. The patient was placed prone on the fluoroscopic table,  the L4-L5 interspace was visualized, and the skin was marked.  After thorough povidone-iodine scrubbing, the site was draped in sterile fashion. 1% lidocaine was used to anesthetize the skin and deeper soft tissues.  A 20-gauge Crawford epidural needle was inserted and advanced into the epidural space using loss of resistance technique with normal saline.  The epidural space was identified on first pass without evidence of blood, cerebrospinal fluid, or paresthesias.  Needle tip placement within the epidural space was confirmed using 1 mL of non-ionic contrast.  There was no intravascular or subarachnoid opacification.  Next, 1264mof Depo- Medrol was administered and flushed using 2 mL of normal saline. The needle was restyletted and withdrawn.  The patient tolerated the procedure well and there was no evidence of procedural complication.  The patient was able to ambulate to the recovery area and observed for an appropriate length of time as outlined on the nursing notes. The patient was discharged home with a driver in good condition with detailed patient instructions.  Fluoroscopy time:  11 seconds  IMPRESSION:  Satisfactory interlaminar lumbar epidural steroid injection, left L4-L5, #2.  Provider: JiCeasar LundResults for orders placed during the hospital encounter of 05/01/10 DG Epidurography  Narrative CLINICAL DATA:  Lumbosacral spondylosis without myelopathy.  Left lower extremity radiculitis.  LUMBAR EPIDURAL INJECTION: An  interlaminar approach was performed on the left at L5-S1.  The overlying skin was cleansed and anesthetized.  A 20 gauge spinal needle was advanced using loss-of-resistance technique.  Injection of 2cc of Omnipaque 180 confirmed epidural placement.  There was no evidence for intravascular or intrathecal spread of contrast.  I then injected 120 mg of Depo-Medrol  and 3m of 1% lidocaine.  The patient tolerated the procedure without evidence for complication. The patient  was observed for 20 minutes prior to discharge in stable neurologic condition.  FLUORO TIME:  29 seconds  IMPRESSIONS:  Technically successful first interlaminar epidural steroid injection on the left at L5-S1.  Provider: KCandis Schatz Hip Imaging: Hip-R DG 2-3 views: Results for orders placed during the hospital encounter of 11/23/20 DG HIP UNILAT W OR W/O PELVIS 2-3 VIEWS RIGHT  Narrative CLINICAL DATA:  Right hip pain.  EXAM: DG HIP (WITH OR WITHOUT PELVIS) 2-3V RIGHT  COMPARISON:  September 08, 2019  FINDINGS: Previous vertebroplasty. No acute fractures are seen. The patient is status post repair of bilateral hip fractures with 3 screws on the right and a gamma nail and intramedullary femoral rod on the left. Hardware is in good position. No other acute abnormalities.  IMPRESSION: Hardware associated with both hips is stable. No interval changes or acute abnormalities. Previous vertebroplasty.   Electronically Signed By: DDorise BullionIII M.D On: 11/25/2020 16:48  Hip-L DG 2-3 views: Results for orders placed during the hospital encounter of 11/23/20 DG HIP UNILAT W OR W/O PELVIS 2-3 VIEWS LEFT  Narrative CLINICAL DATA:  Right hip pain.  EXAM: DG HIP (WITH OR WITHOUT PELVIS) 2-3V LEFT  COMPARISON:  None.  FINDINGS: Previous vertebroplasty in the lumbosacral spine. A gamma nail and femoral rod are seen in the proximal femur with a distal interlocking screw associated with the  intramedullary femoral rod. No acute fractures or dislocations. No other acute abnormalities.  IMPRESSION: No acute abnormalities.   Electronically Signed By: DDorise BullionIII M.D On: 11/25/2020 16:49  Elbow Imaging: Elbow-L DG Complete: Results for orders placed during the hospital encounter of 01/20/17 DG Elbow Complete Left  Narrative CLINICAL DATA:  Fall.  EXAM: LEFT ELBOW - COMPLETE 3+ VIEW  COMPARISON:  06/18/2014 .  FINDINGS: Left elbow joint effusion appears to be present. Subtle lucency noted in the radial head. This could be a vascular channel. Very subtle radial head fracture cannot be completely excluded. Diffuse osteopenia. Mild degenerative change.  IMPRESSION: 1. Left elbow joint effusion appears to be present.  2. Subtle lucency noted in the radial head. This could represent a vascular channel. A subtle nondisplaced fracture cannot be completely excluded.   Electronically Signed By: TMarcello Moores Register On: 01/20/2017 07:35  Hand Imaging: Hand-R DG Complete: Results for orders placed during the hospital encounter of 11/23/20 DG Hand Complete Right  Narrative CLINICAL DATA:  Chronic bilateral hand pain with ulnar deviation.  EXAM: RIGHT HAND - COMPLETE 3+ VIEW  COMPARISON:  None.  FINDINGS: Evaluation of the digits on the lateral view is limited due to osseous overlap. The bones are diffusely under mineralized. There is ulnar deviation of the second through fifth digits at the metacarpal phalangeal joints with associated joint space narrowing. No erosions, significant osteophytes, or bony destruction. Chondrocalcinosis of the triangular fibrocartilage. No focal soft tissue abnormality.  IMPRESSION: 1. Ulnar deviation of the second through fifth digits at the metacarpophalangeal joints with associated joint space narrowing. This can be seen in the setting of inflammatory arthropathy, although no erosions are seen. 2. Chondrocalcinosis of  the triangular fibrocartilage. 3. Diffuse bony under mineralization with osteopenia/osteoporosis.   Electronically Signed By: MKeith RakeM.D. On: 11/26/2020 17:14  Hand-L DG Complete: Results for orders placed during the hospital encounter of 11/23/20 DG Hand Complete Left  Narrative CLINICAL DATA:  Chronic bilateral hand pain with  ulnar deviation.  EXAM: LEFT HAND - COMPLETE 3+ VIEW  COMPARISON:  None.  FINDINGS: The bones are diffusely under mineralized. There is slight ulnar deviation of the second through fifth digits at the metacarpal phalangeal joints. Mild metacarpal phalangeal joint space narrowing of the second through fifth digits. Mild joint space narrowing of the distal interphalangeal joints of the second and third digits. No significant osteophytes. No erosions or bone destruction. Chondrocalcinosis of the triangular fibrocartilage. No fracture or focal bone lesion. No soft tissue abnormality.  IMPRESSION: 1. Slight ulnar deviation of the second through fifth digits at the metacarpophalangeal joints. 2. Mild joint space narrowing of the second through fifth digits and distal interphalangeal joints of the second and third digits. 3. Chondrocalcinosis of the triangular fibrocartilage.   Electronically Signed By: Keith Rake M.D. On: 11/26/2020 17:12  Complexity Note: Imaging results reviewed. Results shared with Ashley Young, using Layman's terms.                        Meds   Current Outpatient Medications:    acetaminophen (TYLENOL) 325 MG tablet, Take 2 tablets (650 mg total) by mouth every 6 (six) hours as needed for mild pain (or Fever >/= 101)., Disp: , Rfl:    ARIPiprazole (ABILIFY) 5 MG tablet, Take 5 mg by mouth 2 (two) times daily., Disp: , Rfl:    atorvastatin (LIPITOR) 20 MG tablet, Take 20 mg by mouth at bedtime., Disp: , Rfl:    calcium-vitamin D (OSCAL WITH D) 500-200 MG-UNIT tablet, Take 1 tablet by mouth daily with breakfast.,  Disp: , Rfl:    DULoxetine (CYMBALTA) 30 MG capsule, Take 30 mg by mouth daily. , Disp: , Rfl:    furosemide (LASIX) 40 MG tablet, Take 40 mg by mouth every morning., Disp: , Rfl:    gabapentin (NEURONTIN) 300 MG capsule, Take 300 mg by mouth in the morning, at noon, and at bedtime., Disp: , Rfl:    latanoprost (XALATAN) 0.005 % ophthalmic solution, Place 1 drop into both eyes at bedtime., Disp: , Rfl:    loperamide (IMODIUM A-D) 2 MG tablet, Take 2 mg by mouth 3 (three) times daily as needed for diarrhea or loose stools., Disp: , Rfl:    Multiple Vitamin (MULTIVITAMIN) tablet, Take 1 tablet by mouth daily., Disp: , Rfl:    MYRBETRIQ 25 MG TB24 tablet, Take 25 mg by mouth daily., Disp: , Rfl:    ondansetron (ZOFRAN) 4 MG tablet, Take 4 mg by mouth every 8 (eight) hours as needed for nausea or vomiting., Disp: , Rfl:    Oyster Shell (OYSTER CALCIUM) 500 MG TABS tablet, Take 500 mg of elemental calcium by mouth daily., Disp: , Rfl:    pantoprazole (PROTONIX) 40 MG tablet, Take 40 mg by mouth daily. , Disp: , Rfl:    polyethylene glycol (MIRALAX / GLYCOLAX) packet, Take 17 g by mouth daily as needed., Disp: , Rfl:    potassium chloride (K-DUR,KLOR-CON) 10 MEQ tablet, Take 1 tablet by mouth daily., Disp: , Rfl:    promethazine (PHENERGAN) 25 MG tablet, Take 25 mg by mouth every 6 (six) hours as needed for nausea or vomiting., Disp: , Rfl:    SUMAtriptan (IMITREX) 100 MG tablet, 100 mg every 2 (two) hours as needed. , Disp: , Rfl:    traMADol (ULTRAM) 50 MG tablet, Take 50 mg by mouth at bedtime as needed., Disp: , Rfl:    traZODone (DESYREL) 50 MG tablet, Take  25 mg by mouth at bedtime., Disp: , Rfl:    vitamin B-12 (CYANOCOBALAMIN) 500 MCG tablet, Take 500 mcg by mouth daily., Disp: , Rfl:   ROS  Constitutional: Denies any fever or chills Gastrointestinal: No reported hemesis, hematochezia, vomiting, or acute GI distress Musculoskeletal: Denies any acute onset joint swelling, redness, loss of ROM,  or weakness Neurological: No reported episodes of acute onset apraxia, aphasia, dysarthria, agnosia, amnesia, paralysis, loss of coordination, or loss of consciousness  Allergies  Ashley Young is allergic to penicillins, aspirin, ferrous gluconate, iron polysaccharide, nsaids, other, sulfamethoxazole-trimethoprim, tolmetin, and venofer [iron sucrose].  Ashley Young  Drug: Ashley Young  reports no history of drug use. Alcohol:  reports no history of alcohol use. Tobacco:  reports that she quit smoking about 27 years ago. Her smoking use included cigarettes. She has never used smokeless tobacco. Medical:  has a past medical history of Arthritis, B12 deficiency, CHF (congestive heart failure) ([place]), Chronic systolic heart failure ([place]), Dementia ([place]), Depression, Dysrhythmia, Failure to thrive (0-17), GERD (gastroesophageal reflux disease), Glaucoma, Headache, History of kidney stones, Hypercholesterolemia, IDA (iron deficiency anemia), Migraine, Mood disorder ([place]), Multiple gastric ulcers, Osteoporosis, Renal disorder, and Wears dentures. Surgical: Ashley Young  has a past surgical history that includes Abdominal surgery; Appendectomy; Breast surgery; Cholecystectomy; Cataract extraction w/PHACO (Right, 06/05/2015); Kyphoplasty; Cesarean section; Colonoscopy; Esophagogastroduodenoscopy; Intramedullary (im) nail intertrochanteric (Left, 12/29/2016); Lithotripsy; Kyphoplasty; and Cataract extraction w/PHACO (Left, 04/15/2018). Family: family history includes Breast cancer in her mother; Lung cancer in her father.  Constitutional Exam  General appearance: Well nourished, well developed, and well hydrated. In no apparent acute distress Vitals:   01/02/21 1453  BP: 98/64  Pulse: 86  Temp: (!) 97.1 F (36.2 C)  SpO2: 95%  Weight: 110 lb (49.9 kg)  Height: _0  (1.6 m)   BMI Assessment: Estimated body mass index is 19.49 kg/m as calculated from the following:   Height as of this encounter: _1  (1.6 m).    Weight as of this encounter: 110 lb (49.9 kg).  BMI interpretation table: BMI level Category Range association with higher incidence of chronic pain  <18 kg/m2 Underweight   18.5-24.9 kg/m2 Ideal body weight   25-29.9 kg/m2 Overweight Increased incidence by 20%  30-34.9 kg/m2 Obese (Class I) Increased incidence by 68%  35-39.9 kg/m2 Severe obesity (Class II) Increased incidence by 136%  >40 kg/m2 Extreme obesity (Class III) Increased incidence by 254%   Patient's current BMI Ideal Body weight  Body mass index is 19.49 kg/m. Ideal body weight: 52.4 kg (115 lb 8.3 oz)   BMI Readings from Last 4 Encounters:  01/02/21 19.49 kg/m  12/05/20 20.90 kg/m  11/23/20 21.58 kg/m  09/08/20 24.04 kg/m   Wt Readings from Last 4 Encounters:  01/02/21 110 lb (49.9 kg)  12/05/20 118 lb (53.5 kg)  11/23/20 118 lb (53.5 kg)  09/08/20 119 lb (54 kg)    Psych/Mental status: Alert, oriented x 3 (person, place, & time)       Eyes: PERLA Respiratory: No evidence of acute respiratory distress  Assessment & Plan  Primary Diagnosis & Pertinent Problem List: The primary encounter diagnosis was Other intervertebral disc degeneration, lumbar region. 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  Visit Diagnosis: 1. Other intervertebral disc degeneration, lumbar region   2. Chronic lower extremity pain (1ry area of  Pain) (Bilateral)   3. 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 DDD (degenerative disc disease), lumbar   5. Chronic hip pain (Bilateral)    Problems updated and reviewed during this visit: No problems updated.  Plan of Care  Pharmacotherapy (Medications Ordered): No orders of the defined types were placed in this encounter.  Procedure Orders  Lumbar Epidural Injection   Lab Orders  No laboratory test(s) ordered today   Imaging Orders  MR LUMBAR SPINE WO CONTRAST  Referral Orders  No referral(s) requested today    Pharmacological management options:  Opioid Analgesics: I will not be prescribing any opioids at this time Membrane stabilizer: None prescribed at this time Muscle relaxant: None prescribed at this time NSAID: None prescribed at this time Other analgesic(s): None prescribed at this time     Interventional Therapies  Risk  Complexity Considerations:   Estimated body mass index is 19.49 kg/m as calculated from the following:   Height as of this encounter: _0  (1.6 m).   Weight as of this encounter: 110 lb (49.9 kg). Dementia   Planned  Pending:   Pending further evaluation   Under consideration:   Lumbar MRI Diagnostic Lumbar ESI   Completed:   Diagnostic bilateral IA hip joint injection x1 (12/05/2020) (100/50/0)   Therapeutic  Palliative (PRN) options:   None established    Provider-requested follow-up: Return for Procedure (no sedation): (ML) L4-5 LESI #1. Recent Visits Date Type Provider Dept  01/02/21 Office Visit Milinda Pointer, MD [facility]  12/05/20 Procedure visit Milinda Pointer, MD [facility]  11/23/20 Office Visit Milinda Pointer, MD [facility]  Showing recent visits within past 90 days and meeting all other requirements Today's Visits Date Type Provider Dept  01/04/21 Appointment Milinda Pointer, MD [facility]  Showing today's visits and meeting all other requirements Future Appointments No visits were found meeting these conditions. Showing future appointments within next 90 days and meeting all other requirements Primary Care Physician: Merlene Laughter, MD Note by: Gaspar Cola, MD Date: 01/02/2021; Time: 7:09 AM

## 2021-01-02 NOTE — Progress Notes (Signed)
Safety precautions to be maintained throughout the outpatient stay will include: orient to surroundings, keep bed in low position, maintain call bell within reach at all times, provide assistance with transfer out of bed and ambulation.  

## 2021-01-04 ENCOUNTER — Other Ambulatory Visit: Payer: Self-pay

## 2021-01-04 ENCOUNTER — Encounter: Payer: Self-pay | Admitting: Pain Medicine

## 2021-01-04 ENCOUNTER — Ambulatory Visit (HOSPITAL_BASED_OUTPATIENT_CLINIC_OR_DEPARTMENT_OTHER): Payer: Medicare PPO | Admitting: Pain Medicine

## 2021-01-04 ENCOUNTER — Ambulatory Visit
Admission: RE | Admit: 2021-01-04 | Discharge: 2021-01-04 | Disposition: A | Discharge status: Home / Self Care | Payer: Medicare PPO | Source: Ambulatory Visit | Attending: Pain Medicine | Admitting: Pain Medicine

## 2021-01-04 VITALS — BP 110/71 | HR 70 | Temp 97.3°F | Resp 16 | Ht 63.0 in | Wt 115.0 lb

## 2021-01-04 DIAGNOSIS — M5441 Lumbago with sciatica, right side: Secondary | ICD-10-CM

## 2021-01-04 DIAGNOSIS — M5442 Lumbago with sciatica, left side: Secondary | ICD-10-CM

## 2021-01-04 DIAGNOSIS — G8929 Other chronic pain: Secondary | ICD-10-CM

## 2021-01-04 DIAGNOSIS — M25551 Pain in right hip: Secondary | ICD-10-CM

## 2021-01-04 DIAGNOSIS — M5136 Other intervertebral disc degeneration, lumbar region: Secondary | ICD-10-CM

## 2021-01-04 DIAGNOSIS — M4856XS Collapsed vertebra, not elsewhere classified, lumbar region, sequela of fracture: Secondary | ICD-10-CM

## 2021-01-04 DIAGNOSIS — S32050S Wedge compression fracture of fifth lumbar vertebra, sequela: Secondary | ICD-10-CM | POA: Diagnosis present

## 2021-01-04 DIAGNOSIS — M79605 Pain in left leg: Secondary | ICD-10-CM | POA: Diagnosis not present

## 2021-01-04 DIAGNOSIS — D500 Iron deficiency anemia secondary to blood loss (chronic): Secondary | ICD-10-CM

## 2021-01-04 DIAGNOSIS — M79604 Pain in right leg: Secondary | ICD-10-CM

## 2021-01-04 DIAGNOSIS — M25552 Pain in left hip: Secondary | ICD-10-CM | POA: Insufficient documentation

## 2021-01-04 DIAGNOSIS — D508 Other iron deficiency anemias: Secondary | ICD-10-CM

## 2021-01-04 DIAGNOSIS — M51369 Other intervertebral disc degeneration, lumbar region without mention of lumbar back pain or lower extremity pain: Secondary | ICD-10-CM

## 2021-01-04 DIAGNOSIS — M5116 Intervertebral disc disorders with radiculopathy, lumbar region: Secondary | ICD-10-CM | POA: Insufficient documentation

## 2021-01-04 MED ORDER — ROPIVACAINE HCL 2 MG/ML IJ SOLN
INTRAMUSCULAR | Status: AC
  Filled 2021-01-04: qty 20

## 2021-01-04 MED ORDER — IOHEXOL 180 MG/ML  SOLN
INTRAMUSCULAR | Status: AC
  Filled 2021-01-04: qty 20

## 2021-01-04 MED ORDER — IOHEXOL 180 MG/ML  SOLN
10.0000 mL | Freq: Once | INTRAMUSCULAR | Status: AC
  Administered 2021-01-04: 10 mL via EPIDURAL

## 2021-01-04 MED ORDER — ROPIVACAINE HCL 2 MG/ML IJ SOLN
2.0000 mL | Freq: Once | INTRAMUSCULAR | Status: AC
  Administered 2021-01-04: 2 mL via EPIDURAL

## 2021-01-04 MED ORDER — LIDOCAINE HCL 2 % IJ SOLN
20.0000 mL | Freq: Once | INTRAMUSCULAR | Status: AC
  Administered 2021-01-04: 200 mg

## 2021-01-04 MED ORDER — LIDOCAINE HCL (PF) 2 % IJ SOLN
INTRAMUSCULAR | Status: AC
  Filled 2021-01-04: qty 5

## 2021-01-04 MED ORDER — TRIAMCINOLONE ACETONIDE 40 MG/ML IJ SUSP
INTRAMUSCULAR | Status: AC
  Filled 2021-01-04: qty 1

## 2021-01-04 MED ORDER — SODIUM CHLORIDE (PF) 0.9 % IJ SOLN
INTRAMUSCULAR | Status: AC
  Filled 2021-01-04: qty 10

## 2021-01-04 MED ORDER — SODIUM CHLORIDE 0.9% FLUSH
2.0000 mL | Freq: Once | INTRAVENOUS | Status: AC
  Administered 2021-01-04: 2 mL

## 2021-01-04 MED ORDER — TRIAMCINOLONE ACETONIDE 40 MG/ML IJ SUSP
40.0000 mg | Freq: Once | INTRAMUSCULAR | Status: AC
  Administered 2021-01-04: 40 mg

## 2021-01-04 NOTE — Patient Instructions (Addendum)
____________________________________________________________________________________________  Post-Procedure Discharge Instructions  Instructions: Apply ice:  Purpose: This will minimize any swelling and discomfort after procedure.  When: Day of procedure, as soon as you get home. How: Fill a plastic sandwich bag with crushed ice. Cover it with a small towel and apply to injection site. How long: (15 min on, 15 min off) Apply for 15 minutes then remove x 15 minutes.  Repeat sequence on day of procedure, until you go to bed. Apply heat:  Purpose: To treat any soreness and discomfort from the procedure. When: Starting the next day after the procedure. How: Apply heat to procedure site starting the day following the procedure. How long: May continue to repeat daily, until discomfort goes away. Food intake: Start with clear liquids (like water) and advance to regular food, as tolerated.  Physical activities: Keep activities to a minimum for the first 8 hours after the procedure. After that, then as tolerated. Driving: If you have received any sedation, be responsible and do not drive. You are not allowed to drive for 24 hours after having sedation. Blood thinner: (Applies only to those taking blood thinners) You may restart your blood thinner 6 hours after your procedure. Insulin: (Applies only to Diabetic patients taking insulin) As soon as you can eat, you may resume your normal dosing schedule. Infection prevention: Keep procedure site clean and dry. Shower daily and clean area with soap and water. Post-procedure Pain Diary: Extremely important that this be done correctly and accurately. Recorded information will be used to determine the next step in treatment. For the purpose of accuracy, follow these rules: Evaluate only the area treated. Do not report or include pain from an untreated area. For the purpose of this evaluation, ignore all other areas of pain, except for the treated  area. After your procedure, avoid taking a long nap and attempting to complete the pain diary after you wake up. Instead, set your alarm clock to go off every hour, on the hour, for the initial 8 hours after the procedure. Document the duration of the numbing medicine, and the relief you are getting from it. Do not go to sleep and attempt to complete it later. It will not be accurate. If you received sedation, it is likely that you were given a medication that may cause amnesia. Because of this, completing the diary at a later time may cause the information to be inaccurate. This information is needed to plan your care. Follow-up appointment: Keep your post-procedure follow-up evaluation appointment after the procedure (usually 2 weeks for most procedures, 6 weeks for radiofrequencies). DO NOT FORGET to bring you pain diary with you.   Expect: (What should I expect to see with my procedure?) From numbing medicine (AKA: Local Anesthetics): Numbness or decrease in pain. You may also experience some weakness, which if present, could last for the duration of the local anesthetic. Onset: Full effect within 15 minutes of injected. Duration: It will depend on the type of local anesthetic used. On the average, 1 to 8 hours.  From steroids (Applies only if steroids were used): Decrease in swelling or inflammation. Once inflammation is improved, relief of the pain will follow. Onset of benefits: Depends on the amount of swelling present. The more swelling, the longer it will take for the benefits to be seen. In some cases, up to 10 days. Duration: Steroids will stay in the system x 2 weeks. Duration of benefits will depend on multiple posibilities including persistent irritating factors. Side-effects: If present, they   may typically last 2 weeks (the duration of the steroids). Frequent: Cramps (if they occur, drink Gatorade and take over-the-counter Magnesium 450-500 mg once to twice a day); water retention with  temporary weight gain; increases in blood sugar; decreased immune system response; increased appetite. Occasional: Facial flushing (red, warm cheeks); mood swings; menstrual changes. Uncommon: Long-term decrease or suppression of natural hormones; bone thinning. (These are more common with higher doses or more frequent use. This is why we prefer that our patients avoid having any injection therapies in other practices.)  Very Rare: Severe mood changes; psychosis; aseptic necrosis. From procedure: Some discomfort is to be expected once the numbing medicine wears off. This should be minimal if ice and heat are applied as instructed.  Call if: (When should I call?) You experience numbness and weakness that gets worse with time, as opposed to wearing off. New onset bowel or bladder incontinence. (Applies only to procedures done in the spine)  Emergency Numbers: Durning business hours (Monday - Thursday, 8:00 AM - 4:00 PM) (Friday, 9:00 AM - 12:00 Noon): [phone] After hours: [phone] NOTE: If you are having a problem and are unable connect with, or to talk to a provider, then go to your nearest urgent care or emergency department. If the problem is serious and urgent, please call 911. ____________________________________________________________________________________________   ____________________________________________________________________________________________  Post-Procedure Discharge Instructions  Instructions: Apply ice:  Purpose: This will minimize any swelling and discomfort after procedure.  When: Day of procedure, as soon as you get home. How: Fill a plastic sandwich bag with crushed ice. Cover it with a small towel and apply to injection site. How long: (15 min on, 15 min off) Apply for 15 minutes then remove x 15 minutes.  Repeat sequence on day of procedure, until you go to bed. Apply heat:  Purpose: To treat any soreness and discomfort from the  procedure. When: Starting the next day after the procedure. How: Apply heat to procedure site starting the day following the procedure. How long: May continue to repeat daily, until discomfort goes away. Food intake: Start with clear liquids (like water) and advance to regular food, as tolerated.  Physical activities: Keep activities to a minimum for the first 8 hours after the procedure. After that, then as tolerated. Driving: If you have received any sedation, be responsible and do not drive. You are not allowed to drive for 24 hours after having sedation. Blood thinner: (Applies only to those taking blood thinners) You may restart your blood thinner 6 hours after your procedure. Insulin: (Applies only to Diabetic patients taking insulin) As soon as you can eat, you may resume your normal dosing schedule. Infection prevention: Keep procedure site clean and dry. Shower daily and clean area with soap and water. Post-procedure Pain Diary: Extremely important that this be done correctly and accurately. Recorded information will be used to determine the next step in treatment. For the purpose of accuracy, follow these rules: Evaluate only the area treated. Do not report or include pain from an untreated area. For the purpose of this evaluation, ignore all other areas of pain, except for the treated area. After your procedure, avoid taking a long nap and attempting to complete the pain diary after you wake up. Instead, set your alarm clock to go off every hour, on the hour, for the initial 8 hours after the procedure. Document the duration of the numbing medicine, and the relief you are getting from it. Do not go to sleep and attempt   to complete it later. It will not be accurate. If you received sedation, it is likely that you were given a medication that may cause amnesia. Because of this, completing the diary at a later time may cause the information to be inaccurate. This information is needed to plan  your care. Follow-up appointment: Keep your post-procedure follow-up evaluation appointment after the procedure (usually 2 weeks for most procedures, 6 weeks for radiofrequencies). DO NOT FORGET to bring you pain diary with you.   Expect: (What should I expect to see with my procedure?) From numbing medicine (AKA: Local Anesthetics): Numbness or decrease in pain. You may also experience some weakness, which if present, could last for the duration of the local anesthetic. Onset: Full effect within 15 minutes of injected. Duration: It will depend on the type of local anesthetic used. On the average, 1 to 8 hours.  From steroids (Applies only if steroids were used): Decrease in swelling or inflammation. Once inflammation is improved, relief of the pain will follow. Onset of benefits: Depends on the amount of swelling present. The more swelling, the longer it will take for the benefits to be seen. In some cases, up to 10 days. Duration: Steroids will stay in the system x 2 weeks. Duration of benefits will depend on multiple posibilities including persistent irritating factors. Side-effects: If present, they may typically last 2 weeks (the duration of the steroids). Frequent: Cramps (if they occur, drink Gatorade and take over-the-counter Magnesium 450-500 mg once to twice a day); water retention with temporary weight gain; increases in blood sugar; decreased immune system response; increased appetite. Occasional: Facial flushing (red, warm cheeks); mood swings; menstrual changes. Uncommon: Long-term decrease or suppression of natural hormones; bone thinning. (These are more common with higher doses or more frequent use. This is why we prefer that our patients avoid having any injection therapies in other practices.)  Very Rare: Severe mood changes; psychosis; aseptic necrosis. From procedure: Some discomfort is to be expected once the numbing medicine wears off. This should be minimal if ice and heat are  applied as instructed.  Call if: (When should I call?) You experience numbness and weakness that gets worse with time, as opposed to wearing off. New onset bowel or bladder incontinence. (Applies only to procedures done in the spine)  Emergency Numbers: Durning business hours (Monday - Thursday, 8:00 AM - 4:00 PM) (Friday, 9:00 AM - 12:00 Noon): [phone] After hours: [phone] NOTE: If you are having a problem and are unable connect with, or to talk to a provider, then go to your nearest urgent care or emergency department. If the problem is serious and urgent, please call 911. ____________________________________________________________________________________________   

## 2021-01-04 NOTE — Progress Notes (Signed)
PROVIDER NOTE: Information contained herein reflects review and annotations entered in association with encounter. Interpretation of such information and data should be left to medically-trained personnel. Information provided to patient can be located elsewhere in the medical record under "Patient Instructions". Document created using STT-dictation technology, any transcriptional errors that may result from process are unintentional.    Patient: Ashley Young  Service Category: Procedure  Provider: Oswaldo DoneFrancisco A Bellamie Turney, MD  DOB: Jul 19, 1941  DOS: 01/04/2021  Location: [facility]  MRN: [id number]  Setting: Ambulatory - outpatient  Referring Provider: Delano MetzNaveira, Tarnesha Ulloa, MD  Type: Established Patient  Specialty: Interventional Pain Management  PCP: Angela Coxasanayaka, Gayani Y, MD   Primary Reason for Visit: Interventional Pain Management Treatment. CC: Leg Pain (bilateral)  Procedure:          Anesthesia, Analgesia, Anxiolysis:  Type: Therapeutic Inter-Laminar Epidural Steroid Injection           Region: Lumbar Level: L4-5 Level. Laterality: Midline         Type: Local Anesthesia Indication(s): Analgesia         Route: Infiltration (Felton/IM) IV Access: Declined Sedation: Declined  Local Anesthetic: Lidocaine 1-2%  Position: Prone with head of the table was raised to facilitate breathing.   Indications: 1. DDD (degenerative disc disease), lumbar   2. Chronic lower extremity pain (1ry area of Pain) (Bilateral)   3. 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 Closed compression fracture of L5 lumbar vertebra, sequela   5. Other intervertebral disc degeneration, lumbar region   6. Chronic hip pain (Bilateral)    Pain Score: Pre-procedure: 5 /10 Post-procedure: 0-No pain/10   Pre-op H&P Assessment:  Ashley Young is a 80 y.o. (year old), female patient, seen today for interventional treatment. She  has a past surgical history that includes Abdominal surgery;  Appendectomy; Breast surgery; Cholecystectomy; Cataract extraction w/PHACO (Right, 06/05/2015); Kyphoplasty; Cesarean section; Colonoscopy; Esophagogastroduodenoscopy; Intramedullary (im) nail intertrochanteric (Left, 12/29/2016); Lithotripsy; Kyphoplasty; and Cataract extraction w/PHACO (Left, 04/15/2018). Ashley Young has a current medication list which includes the following prescription(s): acetaminophen, aripiprazole, atorvastatin, calcium-vitamin d, duloxetine, furosemide, gabapentin, latanoprost, loperamide, multivitamin, myrbetriq, ondansetron, oyster calcium, pantoprazole, polyethylene glycol, potassium chloride, promethazine, sumatriptan, tramadol, trazodone, and vitamin b-12. Her primarily concern today is the Leg Pain (bilateral)  Initial Vital Signs:  Pulse/HCG Rate: 69  Temp: (!) 97.3 F (36.3 C) Resp: 14 BP: 98/62 SpO2: 95 %  BMI: Estimated body mass index is 20.37 kg/m as calculated from the following:   Height as of this encounter: 5\' 3"  (1.6 m).   Weight as of this encounter: 115 lb (52.2 kg).  Risk Assessment: Allergies: Reviewed. She is allergic to penicillins, aspirin, ferrous gluconate, iron polysaccharide, nsaids, other, sulfamethoxazole-trimethoprim, tolmetin, and venofer [iron sucrose].  Allergy Precautions: None required Coagulopathies: Reviewed. None identified.  Blood-thinner therapy: None at this time Active Infection(s): Reviewed. None identified. Ashley Young is afebrile  Site Confirmation: Ashley Young was asked to confirm the procedure and laterality before marking the site Procedure checklist: Completed Consent: Before the procedure and under the influence of no sedative(s), amnesic(s), or anxiolytics, the patient was informed of the treatment options, risks and possible complications. To fulfill our ethical and legal obligations, as recommended by the American Medical Association's Code of Ethics, I have informed the patient of my clinical impression; the nature  and purpose of the treatment or procedure; the risks, benefits, and possible complications of the intervention; the alternatives, including doing nothing; the risk(s) and benefit(s) of the  alternative treatment(s) or procedure(s); and the risk(s) and benefit(s) of doing nothing. The patient was provided information about the general risks and possible complications associated with the procedure. These may include, but are not limited to: failure to achieve desired goals, infection, bleeding, organ or nerve damage, allergic reactions, paralysis, and death. In addition, the patient was informed of those risks and complications associated to Spine-related procedures, such as failure to decrease pain; infection (i.e.: Meningitis, epidural or intraspinal abscess); bleeding (i.e.: epidural hematoma, subarachnoid hemorrhage, or any other type of intraspinal or peri-dural bleeding); organ or nerve damage (i.e.: Any type of peripheral nerve, nerve root, or spinal cord injury) with subsequent damage to sensory, motor, and/or autonomic systems, resulting in permanent pain, numbness, and/or weakness of one or several areas of the body; allergic reactions; (i.e.: anaphylactic reaction); and/or death. Furthermore, the patient was informed of those risks and complications associated with the medications. These include, but are not limited to: allergic reactions (i.e.: anaphylactic or anaphylactoid reaction(s)); adrenal axis suppression; blood sugar elevation that in diabetics may result in ketoacidosis or comma; water retention that in patients with history of congestive heart failure may result in shortness of breath, pulmonary edema, and decompensation with resultant heart failure; weight gain; swelling or edema; medication-induced neural toxicity; particulate matter embolism and blood vessel occlusion with resultant organ, and/or nervous system infarction; and/or aseptic necrosis of one or more joints. Finally, the patient  was informed that Medicine is not an exact science; therefore, there is also the possibility of unforeseen or unpredictable risks and/or possible complications that may result in a catastrophic outcome. The patient indicated having understood very clearly. We have given the patient no guarantees and we have made no promises. Enough time was given to the patient to ask questions, all of which were answered to the patient's satisfaction. Ashley Young has indicated that she wanted to continue with the procedure. Attestation: I, the ordering provider, attest that I have discussed with the patient the benefits, risks, side-effects, alternatives, likelihood of achieving goals, and potential problems during recovery for the procedure that I have provided informed consent. Date  Time: 01/04/2021  9:44 AM  Pre-Procedure Preparation:  Monitoring: As per clinic protocol. Respiration, ETCO2, SpO2, BP, heart rate and rhythm monitor placed and checked for adequate function Safety Precautions: Patient was assessed for positional comfort and pressure points before starting the procedure. Time-out: I initiated and conducted the "Time-out" before starting the procedure, as per protocol. The patient was asked to participate by confirming the accuracy of the "Time Out" information. Verification of the correct person, site, and procedure were performed and confirmed by me, the nursing staff, and the patient. "Time-out" conducted as per Joint Commission's Universal Protocol (UP.01.01.01). Time: 1029  Description of Procedure:          Target Area: The interlaminar space, initially targeting the lower laminar border of the superior vertebral body. Approach: Paramedial approach. Area Prepped: Entire Posterior Lumbar Region DuraPrep (Iodine Povacrylex [0.7% available iodine] and Isopropyl Alcohol, 74% w/w) Safety Precautions: Aspiration looking for blood return was conducted prior to all injections. At no point did we inject  any substances, as a needle was being advanced. No attempts were made at seeking any paresthesias. Safe injection practices and needle disposal techniques used. Medications properly checked for expiration dates. SDV (single dose vial) medications used. Description of the Procedure: Protocol guidelines were followed. The procedure needle was introduced through the skin, ipsilateral to the reported pain, and advanced to the target area. Bone  was contacted and the needle walked caudad, until the lamina was cleared. The epidural space was identified using "loss-of-resistance technique" with 2-3 ml of PF-NaCl (0.9% NSS), in a 5cc LOR glass syringe.  Vitals:   01/04/21 0944 01/04/21 1023 01/04/21 1034 01/04/21 1042  BP: 98/62 114/67 109/69 110/71  Pulse: 69 67 71 70  Resp: 14 16 15 16   Temp: (!) 97.3 F (36.3 C)     TempSrc: Temporal     SpO2: 95% 95% 96% 98%  Weight: 115 lb (52.2 kg)     Height: 5\' 3"  (1.6 m)       Start Time: 1029 hrs. End Time: 1033 hrs.  Materials:  Needle(s) Type: Epidural needle Gauge: 17G Length: 3.5-in Medication(s): Please see orders for medications and dosing details.  Imaging Guidance (Spinal):          Type of Imaging Technique: Fluoroscopy Guidance (Spinal) Indication(s): Assistance in needle guidance and placement for procedures requiring needle placement in or near specific anatomical locations not easily accessible without such assistance. Exposure Time: Please see nurses notes. Contrast: Before injecting any contrast, we confirmed that the patient did not have an allergy to iodine, shellfish, or radiological contrast. Once satisfactory needle placement was completed at the desired level, radiological contrast was injected. Contrast injected under live fluoroscopy. No contrast complications. See chart for type and volume of contrast used. Fluoroscopic Guidance: I was personally present during the use of fluoroscopy. "Tunnel Vision Technique" used to obtain the  best possible view of the target area. Parallax error corrected before commencing the procedure. "Direction-depth-direction" technique used to introduce the needle under continuous pulsed fluoroscopy. Once target was reached, antero-posterior, oblique, and lateral fluoroscopic projection used confirm needle placement in all planes. Images permanently stored in EMR. Interpretation: I personally interpreted the imaging intraoperatively. Adequate needle placement confirmed in multiple planes. Appropriate spread of contrast into desired area was observed. No evidence of afferent or efferent intravascular uptake. No intrathecal or subarachnoid spread observed. Permanent images saved into the patient's record.  Antibiotic Prophylaxis:   Anti-infectives (From admission, onward)    None      Indication(s): None identified  Post-operative Assessment:  Post-procedure Vital Signs:  Pulse/HCG Rate: 70 (nsr)  Temp: (!) 97.3 F (36.3 C) Resp: 16 BP: 110/71 SpO2: 98 %  EBL: None  Complications: No immediate post-treatment complications observed by team, or reported by patient.  Note: The patient tolerated the entire procedure well. A repeat set of vitals were taken after the procedure and the patient was kept under observation following institutional policy, for this type of procedure. Post-procedural neurological assessment was performed, showing return to baseline, prior to discharge. The patient was provided with post-procedure discharge instructions, including a section on how to identify potential problems. Should any problems arise concerning this procedure, the patient was given instructions to immediately contact , at any time, without hesitation. In any case, we plan to contact the patient by telephone for a follow-up status report regarding this interventional procedure.  Comments:  No additional relevant information.  Plan of Care  Orders:  Orders Placed This Encounter  Procedures    Lumbar Epidural Injection    Scheduling Instructions:     Procedure: Interlaminar LESI L4-5     Laterality: Midline     Sedation: Patient's choice     Timeframe:  Today    Order Specific Question:   Where will this procedure be performed?    Answer:   [facility]   DG [facility]  C-ARM 1-60 MIN NO REPORT    Intraoperative interpretation by procedural physician at [facility].    Standing Status:   Standing    Number of Occurrences:   1    Order Specific Question:   Reason for exam:    Answer:   Assistance in needle guidance and placement for procedures requiring needle placement in or near specific anatomical locations not easily accessible without such assistance.   Informed Consent Details: Physician/Practitioner Attestation; Transcribe to consent form and obtain patient signature    Note: Always confirm laterality of pain with Ms. Dietz, before procedure. Transcribe to consent form and obtain patient signature.    Order Specific Question:   Physician/Practitioner attestation of informed consent for procedure/surgical case    Answer:   I, the physician/practitioner, attest that I have discussed with the patient the benefits, risks, side effects, alternatives, likelihood of achieving goals and potential problems during recovery for the procedure that I have provided informed consent.    Order Specific Question:   Procedure    Answer:   Lumbar epidural steroid injection under fluoroscopic guidance    Order Specific Question:   Physician/Practitioner performing the procedure    Answer:   Ayslin Kundert A. 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 twice daily (100 mg/day of tramadol) MME/day: 10 mg/day   Medications ordered for procedure: Meds ordered this encounter  Medications   iohexol (OMNIPAQUE) 180 MG/ML injection 10 mL    Must be Myelogram-compatible. If not available, you may substitute with a water-soluble, non-ionic, hypoallergenic, myelogram-compatible radiological contrast medium.   lidocaine (XYLOCAINE) 2 % (with pres) injection 400 mg   sodium chloride flush (NS) 0.9 % injection 2 mL   ropivacaine (PF) 2 mg/mL (0.2%) (NAROPIN) injection 2 mL   triamcinolone acetonide (KENALOG-40) injection 40 mg    Medications administered: We administered iohexol, lidocaine, sodium chloride flush, ropivacaine (PF) 2 mg/mL (0.2%), and triamcinolone acetonide.  See the medical record for exact dosing, route, and time of administration.  Follow-up plan:   Return in about 2 weeks (around 01/18/2021) for procedure day (afternoon VV) (PPE).      Interventional Therapies  Risk  Complexity Considerations:   Estimated body mass index is 19.49 kg/m as calculated from the following:   Height as of this encounter:  (1.6 m).   Weight as of this encounter: 110 lb (49.9 kg). Dementia   Planned  Pending:   Pending further evaluation   Under consideration:   Lumbar MRI Diagnostic Lumbar ESI   Completed:   Diagnostic bilateral IA hip joint injection x1 (12/05/2020) (100/50/0)   Therapeutic  Palliative (PRN) options:   None established     Recent Visits Date Type Provider Dept  01/02/21 Office Visit Delano Metz, MD [facility]  12/05/20 Procedure visit Delano Metz, MD [facility]  11/23/20 Office Visit Delano Metz, MD [facility]  Showing recent visits within past 90 days and meeting all other requirements Today's Visits Date  Type Provider Dept  01/04/21 Procedure visit Delano Metz, MD  [facility]  Showing today's visits and meeting all other requirements Future Appointments Date Type Provider Dept  02/13/21 Appointment Delano Metz, MD [facility]  Showing future appointments within next 90 days and meeting all other requirements Disposition: Discharge home  Discharge (Date  Time): 01/04/2021; 1042 hrs.   Primary Care Physician: Angela Cox, MD Location: [facility] Note by: Oswaldo Done, MD Date: 01/04/2021; Time: 4:39 PM  Disclaimer:  Medicine is not an Visual merchandiser. The only guarantee in medicine is that nothing is guaranteed. It is important to note that the decision to proceed with this intervention was based on the information collected from the patient. The Data and conclusions were drawn from the patient's questionnaire, the interview, and the physical examination. Because the information was provided in large part by the patient, it cannot be guaranteed that it has not been purposely or unconsciously manipulated. Every effort has been made to obtain as much relevant data as possible for this evaluation. It is important to note that the conclusions that lead to this procedure are derived in large part from the available data. Always take into account that the treatment will also be dependent on availability of resources and existing treatment guidelines, considered by other Pain Management Practitioners as being common knowledge and practice, at the time of the intervention. For Medico-Legal purposes, it is also important to point out that variation in procedural techniques and pharmacological choices are the acceptable norm. The indications, contraindications, technique, and results of the above procedure should only be interpreted and judged by a Board-Certified Interventional Pain Specialist with extensive familiarity and expertise in the same exact procedure and technique.

## 2021-01-04 NOTE — Progress Notes (Signed)
Safety precautions to be maintained throughout the outpatient stay will include: orient to surroundings, keep bed in low position, maintain call bell within reach at all times, provide assistance with transfer out of bed and ambulation.  

## 2021-01-05 ENCOUNTER — Encounter: Payer: Self-pay | Admitting: Internal Medicine

## 2021-01-05 ENCOUNTER — Inpatient Hospital Stay: Payer: Medicare PPO

## 2021-01-05 ENCOUNTER — Telehealth: Payer: Self-pay

## 2021-01-05 ENCOUNTER — Other Ambulatory Visit: Payer: Self-pay | Admitting: Internal Medicine

## 2021-01-05 ENCOUNTER — Inpatient Hospital Stay: Payer: Medicare PPO | Attending: Internal Medicine

## 2021-01-05 ENCOUNTER — Inpatient Hospital Stay (HOSPITAL_BASED_OUTPATIENT_CLINIC_OR_DEPARTMENT_OTHER): Payer: Medicare PPO | Admitting: Internal Medicine

## 2021-01-05 DIAGNOSIS — Z9049 Acquired absence of other specified parts of digestive tract: Secondary | ICD-10-CM | POA: Diagnosis not present

## 2021-01-05 DIAGNOSIS — D500 Iron deficiency anemia secondary to blood loss (chronic): Secondary | ICD-10-CM

## 2021-01-05 DIAGNOSIS — D72829 Elevated white blood cell count, unspecified: Secondary | ICD-10-CM | POA: Diagnosis not present

## 2021-01-05 DIAGNOSIS — R000 Tachycardia, unspecified: Secondary | ICD-10-CM | POA: Diagnosis not present

## 2021-01-05 DIAGNOSIS — K922 Gastrointestinal hemorrhage, unspecified: Secondary | ICD-10-CM | POA: Diagnosis not present

## 2021-01-05 DIAGNOSIS — Z886 Allergy status to analgesic agent status: Secondary | ICD-10-CM | POA: Diagnosis not present

## 2021-01-05 DIAGNOSIS — E538 Deficiency of other specified B group vitamins: Secondary | ICD-10-CM | POA: Insufficient documentation

## 2021-01-05 DIAGNOSIS — Z803 Family history of malignant neoplasm of breast: Secondary | ICD-10-CM | POA: Insufficient documentation

## 2021-01-05 DIAGNOSIS — Z801 Family history of malignant neoplasm of trachea, bronchus and lung: Secondary | ICD-10-CM | POA: Insufficient documentation

## 2021-01-05 DIAGNOSIS — D508 Other iron deficiency anemias: Secondary | ICD-10-CM

## 2021-01-05 DIAGNOSIS — R5383 Other fatigue: Secondary | ICD-10-CM | POA: Diagnosis not present

## 2021-01-05 DIAGNOSIS — Z880 Allergy status to penicillin: Secondary | ICD-10-CM | POA: Insufficient documentation

## 2021-01-05 DIAGNOSIS — I5022 Chronic systolic (congestive) heart failure: Secondary | ICD-10-CM | POA: Insufficient documentation

## 2021-01-05 DIAGNOSIS — Z881 Allergy status to other antibiotic agents status: Secondary | ICD-10-CM | POA: Diagnosis not present

## 2021-01-05 DIAGNOSIS — Z79899 Other long term (current) drug therapy: Secondary | ICD-10-CM | POA: Insufficient documentation

## 2021-01-05 DIAGNOSIS — K219 Gastro-esophageal reflux disease without esophagitis: Secondary | ICD-10-CM | POA: Diagnosis not present

## 2021-01-05 DIAGNOSIS — Z87442 Personal history of urinary calculi: Secondary | ICD-10-CM | POA: Diagnosis not present

## 2021-01-05 DIAGNOSIS — F0390 Unspecified dementia without behavioral disturbance: Secondary | ICD-10-CM | POA: Diagnosis not present

## 2021-01-05 LAB — COMPREHENSIVE METABOLIC PANEL
ALT: 27 U/L (ref 0–44)
AST: 23 U/L (ref 15–41)
Albumin: 3.8 g/dL (ref 3.5–5.0)
Alkaline Phosphatase: 93 U/L (ref 38–126)
Anion gap: 12 (ref 5–15)
BUN: 17 mg/dL (ref 8–23)
CO2: 25 mmol/L (ref 22–32)
Calcium: 9.6 mg/dL (ref 8.9–10.3)
Chloride: 100 mmol/L (ref 98–111)
Creatinine, Ser: 0.58 mg/dL (ref 0.44–1.00)
GFR, Estimated: >60 mL/min (ref >60)
Glucose, Bld: 123 mg/dL — ABNORMAL HIGH (ref 70–99)
Potassium: 4.1 mmol/L (ref 3.5–5.1)
Sodium: 137 mmol/L (ref 135–145)
Total Bilirubin: 0.2 mg/dL — ABNORMAL LOW (ref 0.3–1.2)
Total Protein: 7 g/dL (ref 6.5–8.1)

## 2021-01-05 LAB — IRON AND TIBC
Iron: 68 ug/dL (ref 28–170)
Saturation Ratios: 18 % (ref 10.4–31.8)
TIBC: 370 ug/dL (ref 250–450)
UIBC: 302 ug/dL

## 2021-01-05 LAB — CBC WITH DIFFERENTIAL/PLATELET
Abs Immature Granulocytes: 0.05 10*3/uL (ref 0.00–0.07)
Basophils Absolute: 0 10*3/uL (ref 0.0–0.1)
Basophils Relative: 0 %
Eosinophils Absolute: 0 10*3/uL (ref 0.0–0.5)
Eosinophils Relative: 0 %
HCT: 42.1 % (ref 36.0–46.0)
Hemoglobin: 13.6 g/dL (ref 12.0–15.0)
Immature Granulocytes: 1 %
Lymphocytes Relative: 12 %
Lymphs Abs: 1.3 10*3/uL (ref 0.7–4.0)
MCH: 31 pg (ref 26.0–34.0)
MCHC: 32.3 g/dL (ref 30.0–36.0)
MCV: 95.9 fL (ref 80.0–100.0)
Monocytes Absolute: 0.5 10*3/uL (ref 0.1–1.0)
Monocytes Relative: 5 %
Neutro Abs: 8.7 10*3/uL — ABNORMAL HIGH (ref 1.7–7.7)
Neutrophils Relative %: 82 %
Platelets: 234 10*3/uL (ref 150–400)
RBC: 4.39 MIL/uL (ref 3.87–5.11)
RDW: 13.7 % (ref 11.5–15.5)
WBC: 10.5 10*3/uL (ref 4.0–10.5)
nRBC: 0 % (ref 0.0–0.2)

## 2021-01-05 LAB — FERRITIN: Ferritin: 53 ng/mL (ref 11–307)

## 2021-01-05 MED ORDER — CYANOCOBALAMIN 1000 MCG/ML IJ SOLN
1000.0000 ug | Freq: Once | INTRAMUSCULAR | Status: AC
  Administered 2021-01-05: 1000 ug via INTRAMUSCULAR
  Filled 2021-01-05: qty 1

## 2021-01-05 NOTE — Progress Notes (Signed)
[facility] OFFICE PROGRESS NOTE  Patient Care Team: Angela Cox, MD as PCP - General (Internal Medicine) Earna Coder, MD as Consulting Physician (Hematology and Oncology)   SUMMARY OF ONCOLOGIC HISTORY:  # ANEMIA- Multifactorial- Iron def/ B12 def [previous stomach surgeries/PUD]- IV i Feraheme./  # B12  IM injections q 3 MONTHS [[facility]]  #Irregular heart rhythm-sinus tachycardia with PVCs/congestive heart failure [Dr. Fath]; nursing home  INTERVAL HISTORY:  [name] with above history of anemia likely secondary to malabsorption/question chronic GI blood loss- currently on IV iron/B12 shot is here for follow-up.    As per the patient recently noted to have elevated white count 12-15/with predominant neutrophilia-with PCP/Dr. Jari Sportsman.  No new shortness of breath or cough.  No new worsening fatigue.  No blood in stools or black or stools.  Review of Systems  Constitutional:  Positive for malaise/fatigue. Negative for chills, diaphoresis and fever.  HENT:  Negative for nosebleeds and sore throat.   Eyes:  Negative for double vision.  Respiratory:  Negative for cough, hemoptysis, sputum production, shortness of breath and wheezing.   Cardiovascular:  Negative for chest pain, palpitations, orthopnea and leg swelling.  Gastrointestinal:  Negative for abdominal pain, blood in stool, constipation, diarrhea, heartburn, melena, nausea and vomiting.  Genitourinary:  Negative for dysuria, frequency and urgency.  Musculoskeletal:  Positive for back pain and joint pain.  Skin: Negative.  Negative for itching and rash.  Neurological:  Negative for dizziness, tingling, focal weakness, weakness and headaches.  Endo/Heme/Allergies:  Does not bruise/bleed easily.  Psychiatric/Behavioral:  Negative for depression. The patient is not nervous/anxious and does not have insomnia.     PAST MEDICAL HISTORY :  Past Medical History:   Diagnosis Date   Arthritis    B12 deficiency    CHF (congestive heart failure) (HCC)    Chronic systolic heart failure (HCC)    Dementia (HCC)    Depression    Dysrhythmia    TACHYCARDIA   Failure to thrive (0-17)    GERD (gastroesophageal reflux disease)    Glaucoma    Headache    MIGRAINES (now approx 1x/month)   History of kidney stones    Hypercholesterolemia    IDA (iron deficiency anemia)    Migraine    Mood disorder (HCC)    Multiple gastric ulcers    Osteoporosis    Renal disorder    Multiple episodes of kidney stones.    Wears dentures    full upper and lower    PAST SURGICAL HISTORY :   Past Surgical History:  Procedure Laterality Date   ABDOMINAL SURGERY     Reconstructed stomach and 3 different surgeries.    APPENDECTOMY     BREAST SURGERY     cyst removal   CATARACT EXTRACTION W/PHACO Right 06/05/2015   Procedure: CATARACT EXTRACTION PHACO AND INTRAOCULAR LENS PLACEMENT (IOC);  Surgeon: Lockie Mola, MD;  Location: [facility];  Service: Ophthalmology;  Laterality: Right;  [place] 01:06    CATARACT EXTRACTION W/PHACO Left 04/15/2018   Procedure: CATARACT EXTRACTION PHACO AND INTRAOCULAR LENS PLACEMENT (IOC) LEFT;  Surgeon: Lockie Mola, MD;  Location: [facility];  Service: Ophthalmology;  Laterality: Left;   CESAREAN SECTION     x 3   CHOLECYSTECTOMY     COLONOSCOPY     ESOPHAGOGASTRODUODENOSCOPY     INTRAMEDULLARY (IM) NAIL INTERTROCHANTERIC Left 12/29/2016   Procedure: INTRAMEDULLARY (IM) NAIL INTERTROCHANTRIC;  Surgeon: Kennedy Bucker, MD;  Location: [facility];  Service: Orthopedics;  Laterality: Left;   KYPHOPLASTY     KYPHOPLASTY     LITHOTRIPSY     and stone extraction    FAMILY HISTORY :   Family History  Problem Relation Age of Onset   Breast cancer Mother    Lung cancer Father     SOCIAL HISTORY:   Social History   Tobacco Use   Smoking status: Former    Pack years: 0.00    Types: Cigarettes    Quit date: 11/29/1993     Years since quitting: 27.1   Smokeless tobacco: Never  Vaping Use   Vaping Use: Never used  Substance Use Topics   Alcohol use: No    Alcohol/week: 1.0 standard drink    Types: 1 Standard drinks or equivalent per week   Drug use: No    ALLERGIES:  is allergic to penicillins, aspirin, ferrous gluconate, iron polysaccharide, nsaids, other, sulfamethoxazole-trimethoprim, tolmetin, and venofer [iron sucrose].  MEDICATIONS:  Current Outpatient Medications  Medication Sig Dispense Refill   acetaminophen (TYLENOL) 325 MG tablet Take 2 tablets (650 mg total) by mouth every 6 (six) hours as needed for mild pain (or Fever >/= 101).     ARIPiprazole (ABILIFY) 5 MG tablet Take 5 mg by mouth 2 (two) times daily.     atorvastatin (LIPITOR) 20 MG tablet Take 20 mg by mouth at bedtime.     calcium-vitamin D (OSCAL WITH D) 500-200 MG-UNIT tablet Take 1 tablet by mouth daily with breakfast.     DULoxetine (CYMBALTA) 30 MG capsule Take 30 mg by mouth daily.      furosemide (LASIX) 40 MG tablet Take 40 mg by mouth every morning.     gabapentin (NEURONTIN) 300 MG capsule Take 300 mg by mouth in the morning, at noon, and at bedtime.     latanoprost (XALATAN) 0.005 % ophthalmic solution Place 1 drop into both eyes at bedtime.     loperamide (IMODIUM A-D) 2 MG tablet Take 2 mg by mouth 3 (three) times daily as needed for diarrhea or loose stools.     Multiple Vitamin (MULTIVITAMIN) tablet Take 1 tablet by mouth daily.     MYRBETRIQ 25 MG TB24 tablet Take 25 mg by mouth daily.     ondansetron (ZOFRAN) 4 MG tablet Take 4 mg by mouth every 8 (eight) hours as needed for nausea or vomiting.     Oyster Shell (OYSTER CALCIUM) 500 MG TABS tablet Take 500 mg of elemental calcium by mouth daily.     pantoprazole (PROTONIX) 40 MG tablet Take 40 mg by mouth daily.      polyethylene glycol (MIRALAX / GLYCOLAX) packet Take 17 g by mouth daily as needed.     potassium chloride (K-DUR,KLOR-CON) 10 MEQ tablet Take 1 tablet  by mouth daily.     potassium chloride (KLOR-CON) 10 MEQ tablet Take 10 mEq by mouth daily.     promethazine (PHENERGAN) 25 MG tablet Take 25 mg by mouth every 6 (six) hours as needed for nausea or vomiting.     SUMAtriptan (IMITREX) 100 MG tablet 100 mg every 2 (two) hours as needed.      traMADol (ULTRAM) 50 MG tablet Take 2 tablets by mouth at bedtime.     traZODone (DESYREL) 50 MG tablet Take 25 mg by mouth at bedtime.     vitamin B-12 (CYANOCOBALAMIN) 500 MCG tablet Take 500 mcg by mouth daily.     No current facility-administered medications for this visit.  PHYSICAL EXAMINATION:  BP 110/61 (BP Location: Left Arm, Patient Position: Sitting, Cuff Size: Normal)   Pulse 64   Temp 99.1 F (37.3 C) (Tympanic)   Resp 16   Ht 5\' 3"  (1.6 m)   Wt 113 lb 14.4 oz (51.7 kg)   SpO2 94%   BMI 20.18 kg/m   Filed Weights   01/05/21 1318  Weight: 113 lb 14.4 oz (51.7 kg)    Physical Exam Constitutional:      Comments: Elderly frail-appearing female patient.  She is in a wheelchair.  Accompanied by husband.  HENT:     Head: Normocephalic and atraumatic.     Mouth/Throat:     Pharynx: No oropharyngeal exudate.  Eyes:     Pupils: Pupils are equal, round, and reactive to light.  Cardiovascular:     Rate and Rhythm: Normal rate and regular rhythm.  Pulmonary:     Effort: No respiratory distress.     Breath sounds: No wheezing.  Abdominal:     General: Bowel sounds are normal. There is no distension.     Palpations: Abdomen is soft. There is no mass.     Tenderness: no abdominal tenderness There is no guarding or rebound.  Musculoskeletal:        General: No tenderness. Normal range of motion.     Cervical back: Normal range of motion and neck supple.  Skin:    General: Skin is warm.  Neurological:     Mental Status: She is alert and oriented to person, place, and time.  Psychiatric:        Mood and Affect: Affect normal.    LABORATORY DATA:  I have reviewed the data as  listed    Component Value Date/Time   NA 137 01/05/2021 1248   NA 139 11/10/2014 0503   K 4.1 01/05/2021 1248   K 3.6 11/10/2014 0503   CL 100 01/05/2021 1248   CL 107 11/10/2014 0503   CO2 25 01/05/2021 1248   CO2 24 11/10/2014 0503   GLUCOSE 123 (H) 01/05/2021 1248   GLUCOSE 84 11/10/2014 0503   BUN 17 01/05/2021 1248   BUN 7 11/10/2014 0503   CREATININE 0.58 01/05/2021 1248   CREATININE 0.56 11/10/2014 0503   CALCIUM 9.6 01/05/2021 1248   CALCIUM 7.8 (L) 11/10/2014 0503   PROT 7.0 01/05/2021 1248   PROT 6.9 11/09/2014 1254   ALBUMIN 3.8 01/05/2021 1248   ALBUMIN 3.5 11/09/2014 1254   AST 23 01/05/2021 1248   AST 19 11/09/2014 1254   ALT 27 01/05/2021 1248   ALT 15 11/09/2014 1254   ALKPHOS 93 01/05/2021 1248   ALKPHOS 155 (H) 11/09/2014 1254   BILITOT 0.2 (L) 01/05/2021 1248   BILITOT 0.3 11/09/2014 1254   GFRNONAA >60 01/05/2021 1248   GFRNONAA >60 11/10/2014 0503   GFRAA >60 02/04/2020 1250   GFRAA >60 11/10/2014 0503    No results found for: SPEP, UPEP  Lab Results  Component Value Date   WBC 10.5 01/05/2021   NEUTROABS 8.7 (H) 01/05/2021   HGB 13.6 01/05/2021   HCT 42.1 01/05/2021   MCV 95.9 01/05/2021   PLT 234 01/05/2021      Chemistry      Component Value Date/Time   NA 137 01/05/2021 1248   NA 139 11/10/2014 0503   K 4.1 01/05/2021 1248   K 3.6 11/10/2014 0503   CL 100 01/05/2021 1248   CL 107 11/10/2014 0503   CO2 25 01/05/2021 1248  CO2 24 11/10/2014 0503   BUN 17 01/05/2021 1248   BUN 7 11/10/2014 0503   CREATININE 0.58 01/05/2021 1248   CREATININE 0.56 11/10/2014 0503      Component Value Date/Time   CALCIUM 9.6 01/05/2021 1248   CALCIUM 7.8 (L) 11/10/2014 0503   ALKPHOS 93 01/05/2021 1248   ALKPHOS 155 (H) 11/09/2014 1254   AST 23 01/05/2021 1248   AST 19 11/09/2014 1254   ALT 27 01/05/2021 1248   ALT 15 11/09/2014 1254   BILITOT 0.2 (L) 01/05/2021 1248   BILITOT 0.3 11/09/2014 1254       RADIOGRAPHIC STUDIES: I have  personally reviewed the radiological images as listed and agreed with the findings in the report. No results found.   ASSESSMENT & PLAN:  Iron deficiency anemia due to chronic blood loss # Anemia- multifactorial/iron deficiency-B12 deficiency secondary to previous GI surgeries.s/p IV ferrahem-x2 [july2021] Hb-14 today;STABLE HOLD IV ferrahem infusion today.   # Leucocytosis [neutrophilia; PCP]: Likely reactive.  Today CBC-White count 10.5 normal.  #  B12 deficiency- continue B12 shots every 3-4 months;STABLE;  proceed with B12 injection today.  Continue p.o. B12.  # DISPOSITION: # NO Ferrahem infusion today;B 12 injection today.  # follow up in 4 months-MD; cbc/bmp/iron studies; ferritin- B12 injection/ Possible ferrahem-Dr.B   Cc: Karlene EinsteinDasanayaka, Gayani- [phone]- [phone]     Earna CoderGovinda R Stormy Connon, MD 01/11/2021 9:39 PM

## 2021-01-05 NOTE — Assessment & Plan Note (Addendum)
#   Anemia- multifactorial/iron deficiency-B12 deficiency secondary to previous GI surgeries.s/p IV ferrahem-x2 [july2021] Hb-14 today;STABLE HOLD IV ferrahem infusion today.   # Leucocytosis [neutrophilia; PCP]: Likely reactive.  Today CBC-White count 10.5 normal.  #  B12 deficiency- continue B12 shots every 3-4 months;STABLE;  proceed with B12 injection today.  Continue p.o. B12.  # DISPOSITION: # NO Ferrahem infusion today;B 12 injection today.  # follow up in 4 months-MD; cbc/bmp/iron studies; ferritin- B12 injection/ Possible ferrahem-Dr.B   CcKarlene [phone]

## 2021-01-05 NOTE — Progress Notes (Signed)
Having trouble with sleeping at night. Concerned about elevated WBC that was done by an at home nurse.

## 2021-01-05 NOTE — Telephone Encounter (Signed)
Husband denies any needs from the procedure. He will continue to watch her. Instructed tocall with any problems.

## 2021-01-10 ENCOUNTER — Ambulatory Visit: Payer: Medicare PPO | Admitting: Pain Medicine

## 2021-01-10 ENCOUNTER — Encounter: Payer: Self-pay | Admitting: Internal Medicine

## 2021-01-11 ENCOUNTER — Encounter: Payer: Self-pay | Admitting: Internal Medicine

## 2021-01-12 ENCOUNTER — Other Ambulatory Visit: Payer: Self-pay

## 2021-01-12 ENCOUNTER — Ambulatory Visit
Admission: RE | Admit: 2021-01-12 | Discharge: 2021-01-12 | Disposition: A | Discharge status: Home / Self Care | Payer: Medicare PPO | Source: Ambulatory Visit | Attending: Pain Medicine | Admitting: Pain Medicine

## 2021-01-12 DIAGNOSIS — G8929 Other chronic pain: Secondary | ICD-10-CM | POA: Diagnosis present

## 2021-01-12 DIAGNOSIS — M25552 Pain in left hip: Secondary | ICD-10-CM | POA: Insufficient documentation

## 2021-01-12 DIAGNOSIS — M5136 Other intervertebral disc degeneration, lumbar region: Secondary | ICD-10-CM | POA: Diagnosis not present

## 2021-01-12 DIAGNOSIS — M79605 Pain in left leg: Secondary | ICD-10-CM | POA: Diagnosis present

## 2021-01-12 DIAGNOSIS — M25551 Pain in right hip: Secondary | ICD-10-CM | POA: Diagnosis present

## 2021-01-12 DIAGNOSIS — M5442 Lumbago with sciatica, left side: Secondary | ICD-10-CM | POA: Diagnosis present

## 2021-01-12 DIAGNOSIS — M79604 Pain in right leg: Secondary | ICD-10-CM | POA: Insufficient documentation

## 2021-01-12 DIAGNOSIS — M5441 Lumbago with sciatica, right side: Secondary | ICD-10-CM | POA: Diagnosis present

## 2021-02-13 ENCOUNTER — Other Ambulatory Visit: Payer: Self-pay

## 2021-02-13 ENCOUNTER — Ambulatory Visit: Payer: Medicare PPO | Attending: Pain Medicine | Admitting: Pain Medicine

## 2021-02-13 ENCOUNTER — Ambulatory Visit: Payer: Medicare PPO | Admitting: Pain Medicine

## 2021-02-13 DIAGNOSIS — F0390 Unspecified dementia without behavioral disturbance: Secondary | ICD-10-CM | POA: Insufficient documentation

## 2021-02-13 DIAGNOSIS — M79604 Pain in right leg: Secondary | ICD-10-CM

## 2021-02-13 DIAGNOSIS — G894 Chronic pain syndrome: Secondary | ICD-10-CM

## 2021-02-13 DIAGNOSIS — S32050S Wedge compression fracture of fifth lumbar vertebra, sequela: Secondary | ICD-10-CM

## 2021-02-13 DIAGNOSIS — M5441 Lumbago with sciatica, right side: Secondary | ICD-10-CM

## 2021-02-13 DIAGNOSIS — M5442 Lumbago with sciatica, left side: Secondary | ICD-10-CM

## 2021-02-13 DIAGNOSIS — M25552 Pain in left hip: Secondary | ICD-10-CM

## 2021-02-13 DIAGNOSIS — M79605 Pain in left leg: Secondary | ICD-10-CM

## 2021-02-13 DIAGNOSIS — G8929 Other chronic pain: Secondary | ICD-10-CM

## 2021-02-13 DIAGNOSIS — M25551 Pain in right hip: Secondary | ICD-10-CM

## 2021-02-13 NOTE — Progress Notes (Signed)
Patient: Ashley Young  Service Category: E/M  Provider: Gaspar Cola, MD  DOB: 10-31-1940  DOS: 02/13/2021  Location: Office  MRN: [id number]  Setting: Ambulatory outpatient  Referring Provider: Merlene Laughter, MD  Type: Established Patient  Specialty: Interventional Pain Management  PCP: Merlene Laughter, MD  Location: Remote location  Delivery: TeleHealth     Virtual Encounter - Pain Management PROVIDER NOTE: Information contained herein reflects review and annotations entered in association with encounter. Interpretation of such information and data should be left to medically-trained personnel. Information provided to patient can be located elsewhere in the medical record under "Patient Instructions". Document created using STT-dictation technology, any transcriptional errors that may result from process are unintentional.    Contact & Pharmacy Preferred: [phone] [facility] ( Home: [phone] (home) Mobile: [phone] (mobile) E-mail: [email or web address]  [place], [place] - [place] [address] [place] [address] Phone: [phone] Fax: [phone]  [place], [place] - [address] [place] [address] Phone: [phone] Fax: [phone]   Pre-screening  Ashley Young offered "in-person" vs "virtual" encounter. She indicated preferring virtual for this encounter.   Reason COVID-19*  Social distancing based on CDC and AMA recommendations.   I contacted Ashley Young on 02/13/2021 via telephone.      I clearly identified myself as Gaspar Cola, MD. I verified that I was speaking with the correct person using two identifiers (Name: Ashley Young, and date of birth: 1941/04/25).  Consent I sought verbal advanced consent from Ashley Young for virtual visit interactions. I informed Ashley Young of possible security and privacy concerns, risks, and limitations  associated with providing "not-in-person" medical evaluation and management services. I also informed Ashley Young of the availability of "in-person" appointments. Finally, I informed her that there would be a charge for the virtual visit and that she could be  personally, fully or partially, financially responsible for it. Ashley Young expressed understanding and agreed to proceed.   Historic Elements   Ashley Young is a 80 y.o. year old, female patient evaluated today after our last contact on 01/04/2021. Ashley Young  has a past medical history of Arthritis, B12 deficiency, CHF (congestive heart failure) ([place]), Chronic systolic heart failure ([place]), Dementia ([place]), Depression, Dysrhythmia, Failure to thrive (0-17), GERD (gastroesophageal reflux disease), Glaucoma, Headache, History of kidney stones, Hypercholesterolemia, IDA (iron deficiency anemia), Migraine, Mood disorder ([place]), Multiple gastric ulcers, Osteoporosis, Renal disorder, and Wears dentures. She also  has a past surgical history that includes Abdominal surgery; Appendectomy; Breast surgery; Cholecystectomy; Cataract extraction w/PHACO (Right, 06/05/2015); Kyphoplasty; Cesarean section; Colonoscopy; Esophagogastroduodenoscopy; Intramedullary (im) nail intertrochanteric (Left, 12/29/2016); Lithotripsy; Kyphoplasty; and Cataract extraction w/PHACO (Left, 04/15/2018). Ashley Young has a current medication list which includes the following prescription(s): acetaminophen, aripiprazole, atorvastatin, calcium-vitamin d, duloxetine, furosemide, gabapentin, latanoprost, loperamide, multivitamin, myrbetriq, ondansetron, oyster calcium, pantoprazole, polyethylene glycol, potassium chloride, potassium chloride, promethazine, sumatriptan, tramadol, trazodone, and vitamin b-12. She  reports that she quit smoking about 27 years ago. Her smoking use included cigarettes. She has never used smokeless tobacco. She reports that she does not drink alcohol and does  not use drugs. Ashley Young is allergic to penicillins, aspirin, ferrous gluconate, iron polysaccharide, nsaids, other, sulfamethoxazole-trimethoprim, tolmetin, and venofer [iron sucrose].   HPI  Today, she is being contacted for a post-procedure assessment.  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  All we have to go on with is the observations from her husband "Ashley Young".  Today we had a very long conversation regarding our treatments and the apparent lack of benefit from those treatments.  At this point it is my opinion that seeing that there is no apparent benefit from these therapies, I have suggested that we no longer continue with this particular plan of care.  Because we will not be providing any further interventional therapies, also not planning on continuing to see her on a regular basis for medication management.  Currently I am not prescribing any type of medications for her but I did talk to her husband about her current problems.  He refers that her pain seems to be real and although she does have difficulty describing it or providing any type of useful information regarding it, she certainly complains of pain which he has observed to include upper leg pain, buttocks pain, hip pain.  We did start with intra-articular hip joint injections and followed up with lumbar epidural steroid injections, thinking that they would be providing her with some relief of this pain.  According to her husband, this was not the case.  He also indicates that she has switched her sleep cycle where she sleeps from 5:00 in the morning until noon and of course is unable to sleep at night.  I have recommended that he push her into keeping a more regular schedule going to bed and waking up always at the same time.  He states that she usually has no problems falling asleep because of the medications that she  takes to help her sleep, but then wakes up several times in the night complaining of pain.  In looking at the medications that the patient is currently taking I see that she is taking tramadol 50 mg tablets, 2 tabs p.o. at bedtime.  This seems to help with her falling asleep, but they do not last long enough and she wakes up in the middle of the night again complaining of pain.  Although I had made it very clear that I will not be taking her as a patient for medication management, I have provided him with some recommendations to perhaps talk to her primary care physician or Dr. Melrose Nakayama, her neurologist, to perhaps do a trial of Duragesic 12.5 every 72 hours to see if this can provide her with better relief of the pain, minimizing the ups and downs that she is currently experiencing.  I did spell the name and the name of the active ingredient, fentanyl, to her husband so that he could write it down and bring it up to the appropriate physician.  At this point, I will not be giving them any return appointments since our therapies are clearly not being effective in her particular case, primarily due to the fact that we are unable to get any type of feedback.  Post-Procedure Evaluation  Procedure (01/04/2021): Type: Therapeutic Inter-Laminar Epidural Steroid Injection #1          Region: Lumbar Level: L4-5 Level. Laterality: Midline  Pre-procedure pain level: 6/10 Post-procedure: 0/10 (100% relief)  Anxiolysis: none.  Effectiveness during initial hour after procedure (Ultra-Short Term Relief):  (Patients husband states patient has dementia and he cant get an accurate assessment of patients pain.).  Local anesthetic used: Long-acting (4-6 hours) Effectiveness: Defined as any analgesic benefit obtained secondary to the administration of local anesthetics. This carries significant diagnostic value as  to the etiological location, or anatomical origin, of the pain. Duration of benefit is expected to coincide  with the duration of the local anesthetic used.  Effectiveness during initial 4-6 hours after procedure (Short-Term Relief):  (unable to obtain data due to dementia).  Long-term benefit: Defined as any relief past the pharmacologic duration of the local anesthetics.  Effectiveness past the initial 6 hours after procedure (Long-Term Relief):  (unable to obtain data due to dementia).  Benefits, current: Defined as benefit present at the time of this evaluation.   Analgesia:  No benefit Function: No benefit ROM: No benefit  Pharmacotherapy Assessment   Analgesic: Tramadol 50 mg tablet, 1 tab p.o. twice daily (100 mg/day of tramadol) MME/day: 10 mg/day   Monitoring: [place] PMP: PDMP reviewed during this encounter.       Pharmacotherapy: No side-effects or adverse reactions reported. Compliance: No problems identified. Effectiveness: Clinically acceptable. Plan: Refer to "POC". UDS:  Summary  Date Value Ref Range Status  11/23/2020 Note  Final    Comment:    ==================================================================== Compliance Drug Analysis, Ur ==================================================================== Test                             Result       Flag       Units  Drug Present and Declared for Prescription Verification   Gabapentin                     PRESENT      EXPECTED   Duloxetine                     PRESENT      EXPECTED   Trazodone                      PRESENT      EXPECTED   1,3 chlorophenyl piperazine    PRESENT      EXPECTED    1,3-chlorophenyl piperazine is an expected metabolite of trazodone.    Aripiprazole                   PRESENT      EXPECTED   Acetaminophen                  PRESENT      EXPECTED  Drug Present not Declared for Prescription Verification   Doxylamine                     PRESENT      UNEXPECTED  Drug Absent but Declared for Prescription Verification   Tramadol                       Not Detected UNEXPECTED ng/mg creat    Promethazine                   Not Detected UNEXPECTED ==================================================================== Test                      Result    Flag   Units      Ref Range   Creatinine              98               mg/dL      >=20 ==================================================================== Declared Medications:  The flagging and interpretation on this report are  based on the  following declared medications.  Unexpected results may arise from  inaccuracies in the declared medications.   **Note: The testing scope of this panel includes these medications:   Aripiprazole (Abilify)  Duloxetine (Cymbalta)  Gabapentin (Neurontin)  Promethazine (Phenergan)  Tramadol (Ultram)  Trazodone (Desyrel)   **Note: The testing scope of this panel does not include small to  moderate amounts of these reported medications:   Acetaminophen (Tylenol)   **Note: The testing scope of this panel does not include the  following reported medications:   Atorvastatin (Lipitor)  Calcium  Cyanocobalamin  Furosemide (Lasix)  Latanoprost (Xalatan)  Loperamide  Mirabegron (Myrbetriq)  Multivitamin  Ondansetron (Zofran)  Pantoprazole (Protonix)  Polyethylene Glycol (MiraLAX)  Potassium (Klor-Con)  Sumatriptan (Imitrex)  Vitamin D ==================================================================== For clinical consultation, please call [phone]. ====================================================================      Laboratory Chemistry Profile   Renal Lab Results  Component Value Date   BUN 17 01/05/2021   CREATININE 0.58 01/05/2021   GFRAA >60 02/04/2020   GFRNONAA >60 01/05/2021    Hepatic Lab Results  Component Value Date   AST 23 01/05/2021   ALT 27 01/05/2021   ALBUMIN 3.8 01/05/2021   ALKPHOS 93 01/05/2021    Electrolytes Lab Results  Component Value Date   NA 137 01/05/2021   K 4.1 01/05/2021   CL 100 01/05/2021   CALCIUM 9.6 01/05/2021   MG  2.2 11/23/2020   PHOS 3.9 12/31/2016    Bone Lab Results  Component Value Date   VD25OH 36.38 11/23/2020    Inflammation (CRP: Acute Phase) (ESR: Chronic Phase) Lab Results  Component Value Date   CRP 0.6 11/23/2020   ESRSEDRATE 9 11/23/2020   LATICACIDVEN 1.7 11/09/2014         Note: Above Lab results reviewed.  Imaging  MR LUMBAR SPINE WO CONTRAST CLINICAL DATA:  Low back pain.  Lumbosacral osteoarthritis.  EXAM: MRI LUMBAR SPINE WITHOUT CONTRAST  TECHNIQUE: Multiplanar, multisequence MR imaging of the lumbar spine was performed. No intravenous contrast was administered.  COMPARISON:  MRI lumbar spine 09/24/2013  FINDINGS: Segmentation:  Image quality degraded by moderate motion.  Normal segmentation  Alignment:  Slight anterolisthesis L4-5 unchanged  Vertebrae: Chronic compression fracture L5 vertebral body with cement augmentation on the right. This is unchanged from the prior study. No new acute fracture or mass.  Conus medullaris and cauda equina: Conus extends to the T12-L1 level. Conus and cauda equina appear normal.  Paraspinal and other soft tissues: Negative for paraspinous mass or adenopathy.  Disc levels:  T11-12: Extruded disc fragment extending inferiorly behind the T12 vertebral body unchanged from the prior study. No significant stenosis  T12-L1: Right paracentral disc protrusion slightly improved from the prior study. No significant stenosis  L1-2: Mild disc degeneration and disc bulging. No significant stenosis  L2-3: Mild disc and mild facet degeneration. No significant stenosis.  L3-4: Mild disc and mild facet degeneration.  Negative for stenosis  L4-5: Mild anterolisthesis. Mild disc and facet degeneration. Mild subarticular stenosis bilaterally. No interval change.  L5-S1: Negative for stenosis.  IMPRESSION: Image quality degraded by extensive motion.  Multilevel degenerative change similar to the prior MRI in  2015  Chronic fracture L5 with cement vertebroplasty unchanged from the prior study.  Electronically Signed   By: Franchot Gallo M.D.   On: 01/13/2021 10:14  Assessment  The primary encounter diagnosis was Chronic pain syndrome. 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  Plan of Care  Problem-specific:  No problem-specific Assessment & Plan notes found for this encounter.  Ms. TIKI TUCCIARONE has a current medication list which includes the following long-term medication(s): aripiprazole, atorvastatin, calcium-vitamin d, duloxetine, gabapentin, myrbetriq, oyster calcium, pantoprazole, promethazine, and sumatriptan.  Pharmacotherapy (Medications Ordered): No orders of the defined types were placed in this encounter.  Orders:  No orders of the defined types were placed in this encounter.  Follow-up plan:   Return for No return appointments.     Interventional Therapies  Risk  Complexity Considerations:   Estimated body mass index is 19.49 kg/m as calculated from the following:   Height as of this encounter: $RemoveBeforeD'5\' 3"'[id number]$  (1.6 m).   Weight as of this encounter: 110 lb (49.9 kg). Dementia   Planned  Pending:   Pending further evaluation   Under consideration:   Lumbar MRI Diagnostic Lumbar ESI   Completed:   Diagnostic bilateral IA hip joint injection x1 (12/05/2020) (100/50/0)   Therapeutic  Palliative (PRN) options:   None established      Recent Visits Date Type Provider Dept  01/04/21 Procedure visit Milinda Pointer, MD [facility]  01/02/21 Office Visit Milinda Pointer, MD [facility]  12/05/20 Procedure visit Milinda Pointer, MD [facility]  11/23/20 Office  Visit Milinda Pointer, MD [facility]  Showing recent visits within past 90 days and meeting all other requirements Today's Visits Date Type Provider Dept  02/13/21 Telemedicine Milinda Pointer, MD [facility]  Showing today's visits and meeting all other requirements Future Appointments No visits were found meeting these conditions. Showing future appointments within next 90 days and meeting all other requirements I discussed the assessment and treatment plan with the patient. The patient was provided an opportunity to ask questions and all were answered. The patient agreed with the plan and demonstrated an understanding of the instructions.  Patient advised to call back or seek an in-person evaluation if the symptoms or condition worsens.  Duration of encounter: 18 minutes.  Note by: Gaspar Cola, MD Date: 02/13/2021; Time: 3:35 PM

## 2021-04-05 ENCOUNTER — Emergency Department
Admission: EM | Admit: 2021-04-05 | Discharge: 2021-04-05 | Disposition: A | Discharge status: Home / Self Care | Payer: Medicare PPO | Attending: Emergency Medicine | Admitting: Emergency Medicine

## 2021-04-05 ENCOUNTER — Emergency Department: Payer: Medicare PPO

## 2021-04-05 ENCOUNTER — Other Ambulatory Visit: Payer: Self-pay

## 2021-04-05 DIAGNOSIS — M25559 Pain in unspecified hip: Secondary | ICD-10-CM | POA: Insufficient documentation

## 2021-04-05 DIAGNOSIS — Z87891 Personal history of nicotine dependence: Secondary | ICD-10-CM | POA: Insufficient documentation

## 2021-04-05 DIAGNOSIS — F0390 Unspecified dementia without behavioral disturbance: Secondary | ICD-10-CM

## 2021-04-05 DIAGNOSIS — G894 Chronic pain syndrome: Secondary | ICD-10-CM

## 2021-04-05 DIAGNOSIS — G8929 Other chronic pain: Secondary | ICD-10-CM | POA: Insufficient documentation

## 2021-04-05 DIAGNOSIS — Z79899 Other long term (current) drug therapy: Secondary | ICD-10-CM | POA: Diagnosis not present

## 2021-04-05 DIAGNOSIS — I5022 Chronic systolic (congestive) heart failure: Secondary | ICD-10-CM | POA: Diagnosis not present

## 2021-04-05 MED ORDER — OXYCODONE HCL 5 MG PO TABS: 2.5000 mg | ORAL_TABLET | Freq: Two times a day (BID) | ORAL | 0 refills | Status: AC | PRN

## 2021-04-05 MED ORDER — OXYCODONE HCL 5 MG PO TABS
5.0000 mg | ORAL_TABLET | Freq: Once | ORAL | Status: AC
  Administered 2021-04-05: 5 mg via ORAL
  Filled 2021-04-05: qty 1

## 2021-04-05 NOTE — ED Notes (Signed)
See triage note  presents via EMS from home  Husband states she has chronic pain to lower back and both hips  was going to pain clinic but has been released

## 2021-04-05 NOTE — Discharge Instructions (Addendum)
Monitor and manage Ashley Young' pain with the oxycodone immediate release has been added.  Follow-up with primary provider or neurology for any additional chronic pain relief.

## 2021-04-05 NOTE — ED Provider Notes (Signed)
[facility] Emergency Department Provider Note ____________________________________________  Time seen: 1352  I have reviewed the triage vital signs and the nursing notes.  HISTORY  Chief Complaint  Hip Pain   HPI Ashley Young is a 80 y.o. female with a history below including dementia and chronic pain, presents to the ED accompanied by her husband, due to increased complaints of pain.  Husband denies any recent injury, trauma, or fall.  Patient was recently discharged from chronic pain management due to her inability to verbalize acute pain and response to treatment.  The husband notes that the caregiver was unable to manage the patient overnight as she was aggravated and active.  Patient currently takes high-dose tramadol but the husband reports it is not enough to keep her comfortable.  He is not sure what options she has at this time for further pain management.  Past Medical History:  Diagnosis Date   Arthritis    B12 deficiency    CHF (congestive heart failure) (HCC)    Chronic systolic heart failure (HCC)    Dementia (HCC)    Depression    Dysrhythmia    TACHYCARDIA   Failure to thrive (0-17)    GERD (gastroesophageal reflux disease)    Glaucoma    Headache    MIGRAINES (now approx 1x/month)   History of kidney stones    Hypercholesterolemia    IDA (iron deficiency anemia)    Migraine    Mood disorder (HCC)    Multiple gastric ulcers    Osteoporosis    Renal disorder    Multiple episodes of kidney stones.    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) 02/13/2021   Closed compression fracture of L5 lumbar vertebra, sequela 01/04/2021   History of fracture of hips (Bilateral) 12/05/2020   Major depressive disorder, single episode, unspecified 11/23/2020   Abnormal gait 11/23/2020   Abnormal levels of other serum enzymes 11/23/2020    Allergic rhinitis 11/23/2020   Alzheimer's disease (HCC) 11/23/2020   Dementia (HCC) 11/23/2020   Congestive heart failure (HCC) 11/23/2020   History of cataract extraction 11/23/2020   Hyperlipidemia 11/23/2020   Hypocalcemia 11/23/2020   Idiopathic peripheral autonomic neuropathy 11/23/2020   Impaired glucose tolerance 11/23/2020   Insomnia 11/23/2020   Other elevated white blood cell count 11/23/2020   Leukocytosis 11/23/2020   Macrocytosis 11/23/2020   Mild recurrent major depression (HCC) 11/23/2020   Osteoarthritis 11/23/2020   Thrombocytosis 11/23/2020   Urge incontinence of urine 11/23/2020   Urinary incontinence 11/23/2020   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) 11/23/2020   Chronic systolic CHF (congestive heart failure) (HCC) 11/23/2020   Gastroesophageal reflux disease 11/23/2020   Migraine 11/23/2020   Iron deficiency anemia 11/23/2020   Chronic pain syndrome 11/23/2020   Pharmacologic therapy 11/23/2020   Disorder of skeletal system 11/23/2020   Problems influencing health status 11/23/2020   Chronic hip pain (Bilateral) 11/23/2020   Chronic hand pain (Bilateral) 11/23/2020   Ulnar deviation of fingers of hands (Bilateral) 11/23/2020   Chronic low back pain (Bilateral) w/ sciatica (Bilateral) 11/23/2020   Confusion 05/02/2020   Difficulty sleeping 05/02/2020   Memory loss or impairment 12/21/2019   Other fatigue 12/21/2019   Leg cramping 10/27/2019   Chronic lower extremity pain (1ry area of Pain) (Bilateral) 10/27/2019   Restless legs syndrome 10/27/2019  Pulmonary hypertension, mild (HCC) 05/12/2017   Mood disorder (HCC)    DNR (do not resuscitate) 12/30/2016   Hip fracture, left (HCC)    Palliative care encounter    Hypokalemia 12/27/2016   Severe recurrent major depression without psychotic features (HCC) 12/24/2016   Failure to thrive (0-17) 12/24/2016   Closed fracture of ischium (HCC) 07/16/2016   Encounter for  general adult medical examination without abnormal findings 05/06/2016   Chronic systolic heart failure (HCC) 01/12/2016   Borderline diabetes mellitus 01/02/2016   Pure hypercholesterolemia 01/02/2016   Goals of care, counseling/discussion 01/02/2016   Vaccine counseling 01/02/2016   B12 deficiency 04/26/2015   Iron deficiency anemia due to chronic blood loss 04/26/2015   History of anemia 03/28/2015   History of microcytic hypochromic anemia 03/28/2015   Osteoporosis, 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   DDD (degenerative disc disease), lumbar 03/09/2014   Degeneration of intervertebral disc of lumbar region 03/09/2014   Avitaminosis D 02/09/2014   Vitamin D deficiency 02/09/2014   Anxiety state 01/04/2014   Clinical depression 01/04/2014   Bilateral lower extremity edema 01/04/2014   Recurrent major depression in remission (HCC) 01/04/2014   Abdominal pain 11/19/2013   Acquired gastric outlet stenosis 08/24/2013   Adverse effect of salicylate 08/27/2013   Anxiety and depression 08/16/2013   Esophagitis 08/28/2013   H/O gastrointestinal hemorrhage 08/24/2013   H/O gastric ulcer 08/19/2013   Disorder of nutrition 08/09/2013   Headache, migraine 08/11/2013   Calculus of kidney 08/26/2013   OP (osteoporosis) 08/18/2013   Malnourished (HCC) 08/13/2013    Past Surgical History:  Procedure Laterality Date   ABDOMINAL SURGERY     Reconstructed stomach and 3 different surgeries.    APPENDECTOMY     BREAST SURGERY     cyst removal   CATARACT EXTRACTION W/PHACO Right 06/05/2015   Procedure: CATARACT EXTRACTION PHACO AND INTRAOCULAR LENS PLACEMENT (IOC);  Surgeon: Lockie Mola, MD;  Location: [facility];  Service: Ophthalmology;  Laterality: Right;  [place] 01:06    CATARACT EXTRACTION W/PHACO Left 04/15/2018   Procedure: CATARACT EXTRACTION PHACO AND INTRAOCULAR LENS PLACEMENT (IOC) LEFT;  Surgeon: Lockie Mola, MD;  Location: [facility];  Service: Ophthalmology;   Laterality: Left;   CESAREAN SECTION     x 3   CHOLECYSTECTOMY     COLONOSCOPY     ESOPHAGOGASTRODUODENOSCOPY     INTRAMEDULLARY (IM) NAIL INTERTROCHANTERIC Left 12/29/2016   Procedure: INTRAMEDULLARY (IM) NAIL INTERTROCHANTRIC;  Surgeon: Kennedy Bucker, MD;  Location: [facility];  Service: Orthopedics;  Laterality: Left;   KYPHOPLASTY     KYPHOPLASTY     LITHOTRIPSY     and stone extraction    Prior to Admission medications   Medication Sig Start Date End Date Taking? Authorizing Provider  oxyCODONE (ROXICODONE) 5 MG immediate release tablet Take 0.5-1 tablets (2.5-5 mg total) by mouth 2 (two) times daily as needed for up to 10 days. 04/05/21 04/15/21 Yes Feven Alderfer, Charlesetta Ivory, PA-C  acetaminophen (TYLENOL) 325 MG tablet Take 2 tablets (650 mg total) by mouth every 6 (six) hours as needed for mild pain (or Fever >/= 101). 12/31/16   Auburn Bilberry, MD  ARIPiprazole (ABILIFY) 5 MG tablet Take 5 mg by mouth 2 (two) times daily.    [provider]  atorvastatin (LIPITOR) 20 MG tablet Take 20 mg by mouth at bedtime. 08/27/19   [provider]  calcium-vitamin D (OSCAL WITH D) 500-200 MG-UNIT tablet Take 1 tablet by mouth daily with breakfast.    [provider]  DULoxetine (CYMBALTA) 30 MG capsule Take 30 mg by mouth daily.     [provider]  furosemide (LASIX) 40 MG tablet Take 40 mg by mouth every morning. 08/27/19   [provider]  gabapentin (NEURONTIN) 300 MG capsule Take 300 mg by mouth in the morning, at noon, and at bedtime. 01/27/20   [provider]  latanoprost (XALATAN) 0.005 % ophthalmic solution Place 1 drop into both eyes at bedtime. 11/18/14   [provider]  loperamide (IMODIUM A-D) 2 MG tablet Take 2 mg by mouth 3 (three) times daily as needed for diarrhea or loose stools.    [provider]  Multiple Vitamin (MULTIVITAMIN) tablet Take 1 tablet by mouth daily.    [provider]  MYRBETRIQ 25 MG TB24  tablet Take 25 mg by mouth daily. 08/27/19   [provider]  ondansetron (ZOFRAN) 4 MG tablet Take 4 mg by mouth every 8 (eight) hours as needed for nausea or vomiting.    [provider]  Ethelda Chickyster Shell (OYSTER CALCIUM) 500 MG TABS tablet Take 500 mg of elemental calcium by mouth daily.    [provider]  pantoprazole (PROTONIX) 40 MG tablet Take 40 mg by mouth daily.  09/08/14   [provider]  polyethylene glycol (MIRALAX / GLYCOLAX) packet Take 17 g by mouth daily as needed.    [provider]  potassium chloride (K-DUR,KLOR-CON) 10 MEQ tablet Take 1 tablet by mouth daily. 04/14/17   [provider]  potassium chloride (KLOR-CON) 10 MEQ tablet Take 10 mEq by mouth daily. 12/22/20   [provider]  promethazine (PHENERGAN) 25 MG tablet Take 25 mg by mouth every 6 (six) hours as needed for nausea or vomiting.    [provider]  SUMAtriptan (IMITREX) 100 MG tablet 100 mg every 2 (two) hours as needed.  12/30/14   [provider]  traMADol (ULTRAM) 50 MG tablet Take 2 tablets by mouth at bedtime. 11/28/20   [provider]  traZODone (DESYREL) 50 MG tablet Take 25 mg by mouth at bedtime. 08/27/19   [provider]  vitamin B-12 (CYANOCOBALAMIN) 500 MCG tablet Take 500 mcg by mouth daily.    [provider]    Allergies Penicillins, Aspirin, Ferrous gluconate, Iron polysaccharide, Nsaids, Other, Sulfamethoxazole-trimethoprim, Tolmetin, and Venofer [iron sucrose]  Family History  Problem Relation Age of Onset   Breast cancer Mother    Lung cancer Father     Social History Social History   Tobacco Use   Smoking status: Former    Types: Cigarettes    Quit date: 11/29/1993    Years since quitting: 27.3   Smokeless tobacco: Never  Vaping Use   Vaping Use: Never used  Substance Use Topics   Alcohol use: No    Alcohol/week: 1.0 standard drink    Types: 1 Standard drinks or equivalent per  week   Drug use: No    Review of Systems  Constitutional: Negative for fever. Eyes: Negative for visual changes. ENT: Negative for sore throat. Cardiovascular: Negative for chest pain. Respiratory: Negative for shortness of breath. Gastrointestinal: Negative for abdominal pain, vomiting and diarrhea. Genitourinary: Negative for dysuria. Musculoskeletal: Negative for back pain. Skin: Negative for rash. Neurological: Negative for headaches, focal weakness or numbness. ____________________________________________  PHYSICAL EXAM:  VITAL SIGNS: ED Triage Vitals  Enc Vitals Group     BP --      Pulse Rate 04/05/21 1333 78     Resp --  Temp 04/05/21 1333 98 F (36.7 C)     Temp Source 04/05/21 1333 Oral     SpO2 04/05/21 1333 98 %     Weight 04/05/21 1332 112 lb 7 oz (51 kg)     Height 04/05/21 1332 5\' 3"  (1.6 m)     Head Circumference --      Peak Flow --      Pain Score 04/05/21 1334 6     Pain Loc --      Pain Edu? --      Excl. in GC? --     Constitutional: Alert and oriented. Well appearing and in no distress. Head: Normocephalic and atraumatic. Eyes: Conjunctivae are normal.  Normal extraocular movements Cardiovascular: Normal rate, regular rhythm. Normal distal pulses. Respiratory: Normal respiratory effort. No wheezes/rales/rhonchi. Gastrointestinal: Soft and nontender. No distention. Musculoskeletal: Nontender with normal range of motion in all extremities.  Neurologic:  Normal gait without ataxia. Normal speech and language. No gross focal neurologic deficits are appreciated. Skin:  Skin is warm, dry and intact. No rash noted. Psychiatric: Mood is agitated. ____________________________________________    {LABS (pertinent positives/negatives)  ____________________________________________  {EKG  ____________________________________________   RADIOLOGY Official radiology report(s): No results  found. ____________________________________________  PROCEDURES  Roxicodone IR 5 mg PO  Procedures ____________________________________________   INITIAL IMPRESSION / ASSESSMENT AND PLAN / ED COURSE  As part of my medical decision making, I reviewed the following data within the electronic MEDICAL RECORD NUMBER History obtained from family, Notes from prior ED visits, and [place] Controlled Substance Database  Geriatric patient with senile dementia presents with her husband for pain and agitation.  Reported falls but the patient continues to verbally endorse pain.  Patient is otherwise stable at this time.  Exam is reassuring.  She does respond to a single oral dose of oxycodone given in the ED.  She is resting comfortably and without any further verbal out bursts related to pain at this time.  Her husband is able to manage her at this time, and will follow-up with primary neurologist primary care provider for ongoing pain management.  Return precautions of been reviewed.   Ashley Young was evaluated in Emergency Department on 04/05/2021 for the symptoms described in the history of present illness. She was evaluated in the context of the global COVID-19 pandemic, which necessitated consideration that the patient might be at risk for infection with the SARS-CoV-2 virus that causes COVID-19. Institutional protocols and algorithms that pertain to the evaluation of patients at risk for COVID-19 are in a state of rapid change based on information released by regulatory bodies including the CDC and federal and state organizations. These policies and algorithms were followed during the patient's care in the ED.  I reviewed the patient's prescription history over the last 12 months in the multi-state controlled substances database(s) that includes [place], [place], [place], [place], [place], [place], [place], [place], [place], [place], [place], [place], [place], and [place].  Results were notable for regular RXs noted. 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

## 2021-04-05 NOTE — ED Triage Notes (Signed)
Pt comes into the ED via EMS from home, per husband pt c/o BL upper leg and hip pain that is chronic. Pt has dementia and unable to describe her pain,   82HR 95%RA 101/58

## 2021-05-10 ENCOUNTER — Telehealth: Payer: Self-pay | Admitting: Internal Medicine

## 2021-05-10 NOTE — Telephone Encounter (Signed)
Pt's husband called and stated the pt got lab work done at [facility] this past week. He feels that her levels are ok and would like to know if the appt on 10/19 are necessary or if they can push the appt out a few months.

## 2021-05-10 NOTE — Telephone Encounter (Signed)
Dr. B- pls review patient's labs in careeverywhere and advise if ok to r/s pt's apts.

## 2021-05-11 ENCOUNTER — Inpatient Hospital Stay: Payer: Medicare PPO

## 2021-05-11 ENCOUNTER — Inpatient Hospital Stay: Payer: Medicare PPO | Admitting: Hospice and Palliative Medicine

## 2021-05-11 NOTE — Telephone Encounter (Signed)
Left detailed vm for patient's husband that apt next week is no longer needed. Dr. B can see pt in 6 months. I asked husband to give our office a call back or send a mychart msg to confirm that he received this message. Also notified that new apts are also listed in the mychart.

## 2021-05-16 ENCOUNTER — Inpatient Hospital Stay: Payer: Medicare PPO

## 2021-05-16 ENCOUNTER — Inpatient Hospital Stay: Payer: Medicare PPO | Admitting: Hospice and Palliative Medicine

## 2021-05-25 ENCOUNTER — Encounter: Payer: Self-pay | Admitting: Internal Medicine

## 2021-05-25 DIAGNOSIS — Z23 Encounter for immunization: Secondary | ICD-10-CM | POA: Diagnosis not present

## 2021-06-13 ENCOUNTER — Other Ambulatory Visit: Payer: Self-pay

## 2021-06-13 ENCOUNTER — Inpatient Hospital Stay
Admission: EM | Admit: 2021-06-13 | Discharge: 2021-06-19 | DRG: 689 | Disposition: A | Discharge status: Skilled Nursing Facility | Payer: Medicare PPO | Attending: Internal Medicine | Admitting: Internal Medicine

## 2021-06-13 ENCOUNTER — Encounter: Payer: Self-pay | Admitting: Emergency Medicine

## 2021-06-13 ENCOUNTER — Emergency Department: Payer: Medicare PPO

## 2021-06-13 DIAGNOSIS — E538 Deficiency of other specified B group vitamins: Secondary | ICD-10-CM | POA: Diagnosis present

## 2021-06-13 DIAGNOSIS — E785 Hyperlipidemia, unspecified: Secondary | ICD-10-CM | POA: Diagnosis present

## 2021-06-13 DIAGNOSIS — Z8711 Personal history of peptic ulcer disease: Secondary | ICD-10-CM

## 2021-06-13 DIAGNOSIS — M810 Age-related osteoporosis without current pathological fracture: Secondary | ICD-10-CM | POA: Diagnosis present

## 2021-06-13 DIAGNOSIS — R269 Unspecified abnormalities of gait and mobility: Secondary | ICD-10-CM | POA: Diagnosis present

## 2021-06-13 DIAGNOSIS — R64 Cachexia: Secondary | ICD-10-CM | POA: Diagnosis present

## 2021-06-13 DIAGNOSIS — R140 Abdominal distension (gaseous): Secondary | ICD-10-CM

## 2021-06-13 DIAGNOSIS — F0390 Unspecified dementia without behavioral disturbance: Secondary | ICD-10-CM

## 2021-06-13 DIAGNOSIS — F419 Anxiety disorder, unspecified: Secondary | ICD-10-CM | POA: Diagnosis present

## 2021-06-13 DIAGNOSIS — Z20822 Contact with and (suspected) exposure to covid-19: Secondary | ICD-10-CM | POA: Diagnosis present

## 2021-06-13 DIAGNOSIS — G309 Alzheimer's disease, unspecified: Secondary | ICD-10-CM | POA: Diagnosis present

## 2021-06-13 DIAGNOSIS — K219 Gastro-esophageal reflux disease without esophagitis: Secondary | ICD-10-CM | POA: Diagnosis present

## 2021-06-13 DIAGNOSIS — N3000 Acute cystitis without hematuria: Principal | ICD-10-CM | POA: Diagnosis present

## 2021-06-13 DIAGNOSIS — L8990 Pressure ulcer of unspecified site, unspecified stage: Secondary | ICD-10-CM | POA: Insufficient documentation

## 2021-06-13 DIAGNOSIS — W06XXXA Fall from bed, initial encounter: Secondary | ICD-10-CM | POA: Diagnosis present

## 2021-06-13 DIAGNOSIS — E440 Moderate protein-calorie malnutrition: Secondary | ICD-10-CM | POA: Insufficient documentation

## 2021-06-13 DIAGNOSIS — E43 Unspecified severe protein-calorie malnutrition: Secondary | ICD-10-CM | POA: Diagnosis present

## 2021-06-13 DIAGNOSIS — R627 Adult failure to thrive: Secondary | ICD-10-CM | POA: Diagnosis present

## 2021-06-13 DIAGNOSIS — Z880 Allergy status to penicillin: Secondary | ICD-10-CM

## 2021-06-13 DIAGNOSIS — N390 Urinary tract infection, site not specified: Secondary | ICD-10-CM | POA: Diagnosis not present

## 2021-06-13 DIAGNOSIS — D509 Iron deficiency anemia, unspecified: Secondary | ICD-10-CM | POA: Diagnosis present

## 2021-06-13 DIAGNOSIS — M5136 Other intervertebral disc degeneration, lumbar region: Secondary | ICD-10-CM | POA: Diagnosis present

## 2021-06-13 DIAGNOSIS — F0283 Dementia in other diseases classified elsewhere, unspecified severity, with mood disturbance: Secondary | ICD-10-CM | POA: Diagnosis present

## 2021-06-13 DIAGNOSIS — Z803 Family history of malignant neoplasm of breast: Secondary | ICD-10-CM

## 2021-06-13 DIAGNOSIS — R4182 Altered mental status, unspecified: Secondary | ICD-10-CM | POA: Diagnosis present

## 2021-06-13 DIAGNOSIS — Z881 Allergy status to other antibiotic agents status: Secondary | ICD-10-CM

## 2021-06-13 DIAGNOSIS — R296 Repeated falls: Secondary | ICD-10-CM | POA: Diagnosis present

## 2021-06-13 DIAGNOSIS — L89151 Pressure ulcer of sacral region, stage 1: Secondary | ICD-10-CM | POA: Diagnosis present

## 2021-06-13 DIAGNOSIS — G2581 Restless legs syndrome: Secondary | ICD-10-CM | POA: Diagnosis present

## 2021-06-13 DIAGNOSIS — Z9049 Acquired absence of other specified parts of digestive tract: Secondary | ICD-10-CM

## 2021-06-13 DIAGNOSIS — Z66 Do not resuscitate: Secondary | ICD-10-CM | POA: Diagnosis present

## 2021-06-13 DIAGNOSIS — Y92009 Unspecified place in unspecified non-institutional (private) residence as the place of occurrence of the external cause: Secondary | ICD-10-CM

## 2021-06-13 DIAGNOSIS — Z886 Allergy status to analgesic agent status: Secondary | ICD-10-CM

## 2021-06-13 DIAGNOSIS — E860 Dehydration: Secondary | ICD-10-CM | POA: Diagnosis present

## 2021-06-13 DIAGNOSIS — G894 Chronic pain syndrome: Secondary | ICD-10-CM | POA: Diagnosis present

## 2021-06-13 DIAGNOSIS — Z888 Allergy status to other drugs, medicaments and biological substances status: Secondary | ICD-10-CM

## 2021-06-13 DIAGNOSIS — B961 Klebsiella pneumoniae [K. pneumoniae] as the cause of diseases classified elsewhere: Secondary | ICD-10-CM | POA: Diagnosis present

## 2021-06-13 DIAGNOSIS — F32A Depression, unspecified: Secondary | ICD-10-CM | POA: Diagnosis present

## 2021-06-13 DIAGNOSIS — Z801 Family history of malignant neoplasm of trachea, bronchus and lung: Secondary | ICD-10-CM

## 2021-06-13 DIAGNOSIS — I5022 Chronic systolic (congestive) heart failure: Secondary | ICD-10-CM | POA: Diagnosis present

## 2021-06-13 DIAGNOSIS — R5381 Other malaise: Secondary | ICD-10-CM | POA: Diagnosis present

## 2021-06-13 DIAGNOSIS — Z8719 Personal history of other diseases of the digestive system: Secondary | ICD-10-CM

## 2021-06-13 DIAGNOSIS — Z87442 Personal history of urinary calculi: Secondary | ICD-10-CM

## 2021-06-13 DIAGNOSIS — Z87891 Personal history of nicotine dependence: Secondary | ICD-10-CM

## 2021-06-13 DIAGNOSIS — Z79899 Other long term (current) drug therapy: Secondary | ICD-10-CM

## 2021-06-13 DIAGNOSIS — Z6821 Body mass index (BMI) 21.0-21.9, adult: Secondary | ICD-10-CM

## 2021-06-13 LAB — CBC WITH DIFFERENTIAL/PLATELET
Abs Immature Granulocytes: 0.03 10*3/uL (ref 0.00–0.07)
Basophils Absolute: 0.1 10*3/uL (ref 0.0–0.1)
Basophils Relative: 1 %
Eosinophils Absolute: 0.1 10*3/uL (ref 0.0–0.5)
Eosinophils Relative: 1 %
HCT: 46.5 % — ABNORMAL HIGH (ref 36.0–46.0)
Hemoglobin: 15.1 g/dL — ABNORMAL HIGH (ref 12.0–15.0)
Immature Granulocytes: 0 %
Lymphocytes Relative: 11 %
Lymphs Abs: 1.3 10*3/uL (ref 0.7–4.0)
MCH: 30.6 pg (ref 26.0–34.0)
MCHC: 32.5 g/dL (ref 30.0–36.0)
MCV: 94.1 fL (ref 80.0–100.0)
Monocytes Absolute: 0.7 10*3/uL (ref 0.1–1.0)
Monocytes Relative: 7 %
Neutro Abs: 8.8 10*3/uL — ABNORMAL HIGH (ref 1.7–7.7)
Neutrophils Relative %: 80 %
Platelets: 328 10*3/uL (ref 150–400)
RBC: 4.94 MIL/uL (ref 3.87–5.11)
RDW: 12.7 % (ref 11.5–15.5)
WBC: 10.9 10*3/uL — ABNORMAL HIGH (ref 4.0–10.5)
nRBC: 0 % (ref 0.0–0.2)

## 2021-06-13 LAB — URINALYSIS, ROUTINE W REFLEX MICROSCOPIC
Bilirubin Urine: NEGATIVE
Glucose, UA: NEGATIVE mg/dL
Hgb urine dipstick: NEGATIVE
Ketones, ur: NEGATIVE mg/dL
Nitrite: POSITIVE — AB
Protein, ur: 30 mg/dL — AB
Specific Gravity, Urine: 1.014 (ref 1.005–1.030)
WBC, UA: >50 WBC/hpf — ABNORMAL HIGH (ref 0–5)
pH: 5 (ref 5.0–8.0)

## 2021-06-13 LAB — BASIC METABOLIC PANEL
Anion gap: 8 (ref 5–15)
BUN: 15 mg/dL (ref 8–23)
CO2: 28 mmol/L (ref 22–32)
Calcium: 9.5 mg/dL (ref 8.9–10.3)
Chloride: 103 mmol/L (ref 98–111)
Creatinine, Ser: 0.76 mg/dL (ref 0.44–1.00)
GFR, Estimated: >60 mL/min (ref >60)
Glucose, Bld: 105 mg/dL — ABNORMAL HIGH (ref 70–99)
Potassium: 3.6 mmol/L (ref 3.5–5.1)
Sodium: 139 mmol/L (ref 135–145)

## 2021-06-13 LAB — BRAIN NATRIURETIC PEPTIDE: B Natriuretic Peptide: 29.5 pg/mL (ref 0.0–100.0)

## 2021-06-13 LAB — LACTIC ACID, PLASMA: Lactic Acid, Venous: 1.1 mmol/L (ref 0.5–1.9)

## 2021-06-13 LAB — TROPONIN I (HIGH SENSITIVITY)
Troponin I (High Sensitivity): 16 ng/L (ref <18)
Troponin I (High Sensitivity): 18 ng/L — ABNORMAL HIGH (ref <18)
Troponin I (High Sensitivity): 15 ng/L (ref <18)

## 2021-06-13 LAB — RESP PANEL BY RT-PCR (FLU A&B, COVID) ARPGX2
Influenza A by PCR: NEGATIVE
Influenza B by PCR: NEGATIVE
SARS Coronavirus 2 by RT PCR: NEGATIVE

## 2021-06-13 LAB — CK: Total CK: 124 U/L (ref 38–234)

## 2021-06-13 MED ORDER — SODIUM CHLORIDE 0.9 % IV BOLUS
1000.0000 mL | Freq: Once | INTRAVENOUS | Status: AC
  Administered 2021-06-13: 1000 mL via INTRAVENOUS

## 2021-06-13 MED ORDER — ONDANSETRON HCL 4 MG PO TABS
4.0000 mg | ORAL_TABLET | Freq: Four times a day (QID) | ORAL | Status: DC | PRN
  Administered 2021-06-17: 4 mg via ORAL
  Filled 2021-06-13: qty 1

## 2021-06-13 MED ORDER — FOSFOMYCIN TROMETHAMINE 3 G PO PACK: 3.0000 g | PACK | Freq: Once | ORAL | Status: DC

## 2021-06-13 MED ORDER — PANTOPRAZOLE SODIUM 40 MG PO TBEC
40.0000 mg | DELAYED_RELEASE_TABLET | Freq: Every day | ORAL | Status: DC
  Administered 2021-06-13 – 2021-06-19 (×7): 40 mg via ORAL
  Filled 2021-06-13 (×7): qty 1

## 2021-06-13 MED ORDER — DOCUSATE SODIUM 100 MG PO CAPS
100.0000 mg | ORAL_CAPSULE | Freq: Two times a day (BID) | ORAL | Status: DC
  Administered 2021-06-13 – 2021-06-17 (×2): 100 mg via ORAL
  Filled 2021-06-13 (×6): qty 1

## 2021-06-13 MED ORDER — TRAMADOL HCL 50 MG PO TABS
100.0000 mg | ORAL_TABLET | Freq: Every day | ORAL | Status: DC
  Administered 2021-06-13: 100 mg via ORAL
  Filled 2021-06-13: qty 2

## 2021-06-13 MED ORDER — ACETAMINOPHEN 325 MG PO TABS: 650.0000 mg | ORAL_TABLET | Freq: Four times a day (QID) | ORAL | Status: DC | PRN

## 2021-06-13 MED ORDER — ACETAMINOPHEN 325 MG PO TABS
650.0000 mg | ORAL_TABLET | Freq: Four times a day (QID) | ORAL | Status: DC | PRN
  Administered 2021-06-14 – 2021-06-17 (×4): 650 mg via ORAL
  Filled 2021-06-13 (×4): qty 2

## 2021-06-13 MED ORDER — TRAZODONE HCL 50 MG PO TABS
150.0000 mg | ORAL_TABLET | Freq: Every day | ORAL | Status: DC
  Administered 2021-06-13 – 2021-06-18 (×6): 150 mg via ORAL
  Filled 2021-06-13: qty 1
  Filled 2021-06-13 (×5): qty 3

## 2021-06-13 MED ORDER — ATORVASTATIN CALCIUM 20 MG PO TABS
20.0000 mg | ORAL_TABLET | Freq: Every day | ORAL | Status: DC
  Administered 2021-06-13: 20 mg via ORAL
  Filled 2021-06-13: qty 1

## 2021-06-13 MED ORDER — NITROFURANTOIN MONOHYD MACRO 100 MG PO CAPS
100.0000 mg | ORAL_CAPSULE | Freq: Once | ORAL | Status: AC
  Administered 2021-06-13: 100 mg via ORAL
  Filled 2021-06-13: qty 1

## 2021-06-13 MED ORDER — ONDANSETRON HCL 4 MG/2ML IJ SOLN
4.0000 mg | Freq: Four times a day (QID) | INTRAMUSCULAR | Status: DC | PRN
  Administered 2021-06-15: 17:00:00 4 mg via INTRAVENOUS
  Filled 2021-06-13: qty 2

## 2021-06-13 MED ORDER — SODIUM CHLORIDE 0.9 % IV SOLN: INTRAVENOUS | Status: AC

## 2021-06-13 MED ORDER — ACETAMINOPHEN 650 MG RE SUPP: 650.0000 mg | Freq: Four times a day (QID) | RECTAL | Status: DC | PRN

## 2021-06-13 MED ORDER — ALPRAZOLAM 0.25 MG PO TABS
0.2500 mg | ORAL_TABLET | Freq: Four times a day (QID) | ORAL | Status: DC | PRN
  Administered 2021-06-13 – 2021-06-15 (×4): 0.25 mg via ORAL
  Filled 2021-06-13 (×4): qty 1

## 2021-06-13 MED ORDER — DULOXETINE HCL 30 MG PO CPEP
30.0000 mg | ORAL_CAPSULE | Freq: Every day | ORAL | Status: DC
  Administered 2021-06-14 – 2021-06-19 (×6): 30 mg via ORAL
  Filled 2021-06-13 (×7): qty 1

## 2021-06-13 MED ORDER — ARIPIPRAZOLE 5 MG PO TABS
5.0000 mg | ORAL_TABLET | Freq: Two times a day (BID) | ORAL | Status: DC
  Filled 2021-06-13 (×2): qty 1

## 2021-06-13 MED ORDER — TRAMADOL HCL 50 MG PO TABS
100.0000 mg | ORAL_TABLET | Freq: Once | ORAL | Status: AC
Start: 2021-06-13 — End: 2021-06-13
  Administered 2021-06-13: 100 mg via ORAL
  Filled 2021-06-13: qty 2

## 2021-06-13 NOTE — ED Provider Notes (Addendum)
[facility] Emergency Department Provider Note  ____________________________________________   Event Date/Time   First MD Initiated Contact with Patient 06/13/21 [phone]     (approximate)  I have reviewed the triage vital signs and the nursing notes.   HISTORY  Chief Complaint Fall  History is provided by EMS and the patient's husband who was present in the room.  Patient is unable to provide history due to underlying dementia.  She is A&O x1.  HPI Ashley Young is a 80 y.o. female with below medical history including Alzheimer's dementia, failure to thrive, and poor mobility presents to the ED after unwitnessed fall.  Patient's husband reports that he last helped the patient into her bed which is only about 18 inches off the floor, and about 3:00 this morning.  He notes that when he went to check on her approximately 2 hours prior to arrival, he found her on the floor, and was unable to get it back in the bed.  He called out to EMS, who transported the patient here for evaluation.  Patient is unable to reliably verify where she has pain.  She reported pain all over to EMS and then endorsed some right knee pain.  She noted right hand pain to the husband.  She denies any complaints of pain at this time.  Husband reports normal food intake denies any diarrhea, malodorous urine, cough, or fevers.  Patient was last seen in the ED in September following an episodic fall, and was discharged after a short ED course.  Patient has been in the home with her husband who is her primary caregiver.  She was previously at [facility], but the husband decided to care for her in the home at the height of the [place] pandemic.  He is now reporting that the patient is having increased need for personal care, and has had multiple falls.  He is requesting assistance with placement at this time.  Patient's PCP is at the [facility], and she apparently was seen by gerontologist in  [place].  Past Medical History:  Diagnosis Date   Arthritis    B12 deficiency    CHF (congestive heart failure) (HCC)    Chronic systolic heart failure (HCC)    Dementia (HCC)    Depression    Dysrhythmia    TACHYCARDIA   Failure to thrive (0-17)    GERD (gastroesophageal reflux disease)    Glaucoma    Headache    MIGRAINES (now approx 1x/month)   History of kidney stones    Hypercholesterolemia    IDA (iron deficiency anemia)    Migraine    Mood disorder ([place])    Multiple gastric ulcers    Osteoporosis    Renal disorder    Multiple episodes of kidney stones.    Wears dentures    full upper and lower    Patient Active Problem List   Diagnosis Date Noted   UTI (urinary tract infection) 06/13/2021   Senile dementia without behavioral disturbance ([place]) 02/13/2021   Closed compression fracture of L5 lumbar vertebra, sequela 01/04/2021   History of fracture of hips (Bilateral) 12/05/2020   Major depressive disorder, single episode, unspecified 11/23/2020   Abnormal gait 11/23/2020   Abnormal levels of other serum enzymes 11/23/2020   Allergic rhinitis 11/23/2020   Alzheimer's disease ([place]) 11/23/2020   Dementia ([place]) 11/23/2020   Congestive heart failure ([place]) 11/23/2020   History of cataract extraction 11/23/2020   Hyperlipidemia 11/23/2020   Hypocalcemia 11/23/2020  Idiopathic peripheral autonomic neuropathy 11/23/2020   Impaired glucose tolerance 11/23/2020   Insomnia 11/23/2020   Other elevated white blood cell count 11/23/2020   Leukocytosis 11/23/2020   Macrocytosis 11/23/2020   Mild recurrent major depression (HCC) 11/23/2020   Osteoarthritis 11/23/2020   Thrombocytosis 11/23/2020   Urge incontinence of urine 11/23/2020   Urinary incontinence 11/23/2020   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 11/23/2020   Chronic systolic CHF (congestive heart failure) (HCC) 11/23/2020   Gastroesophageal reflux disease 11/23/2020   Migraine  11/23/2020   Iron deficiency anemia 11/23/2020   Chronic pain syndrome 11/23/2020   Pharmacologic therapy 11/23/2020   Disorder of skeletal system 11/23/2020   Problems influencing health status 11/23/2020   Chronic hip pain (Bilateral) 11/23/2020   Chronic hand pain (Bilateral) 11/23/2020   Ulnar deviation of fingers of hands (Bilateral) 11/23/2020   Chronic low back pain (Bilateral) w/ sciatica (Bilateral) 11/23/2020   Confusion 05/02/2020   Difficulty sleeping 05/02/2020   Memory loss or impairment 12/21/2019   Other fatigue 12/21/2019   Leg cramping 10/27/2019   Chronic lower extremity pain (1ry area of Pain) (Bilateral) 10/27/2019   Restless legs syndrome 10/27/2019   Pulmonary hypertension, mild (HCC) 05/12/2017   Mood disorder (HCC)    DNR (do not resuscitate) 12/30/2016   Hip fracture, left (HCC)    Palliative care encounter    Hypokalemia 12/27/2016   Severe recurrent major depression without psychotic features (HCC) 12/24/2016   Failure to thrive (0-17) 12/24/2016   Closed fracture of ischium (HCC) 07/16/2016   Encounter for general adult medical examination without abnormal findings 05/06/2016   Chronic systolic heart failure (HCC) 01/12/2016   Borderline diabetes mellitus 01/02/2016   Pure hypercholesterolemia 01/02/2016   Goals of care, counseling/discussion 01/02/2016   Vaccine counseling 01/02/2016   B12 deficiency 04/26/2015   Iron deficiency anemia due to chronic blood loss 04/26/2015   History of anemia 03/28/2015   History of microcytic hypochromic anemia 03/28/2015   Osteoporosis, 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   DDD (degenerative disc disease), lumbar 03/09/2014   Degeneration of intervertebral disc of lumbar region 03/09/2014   Avitaminosis D 02/09/2014   Vitamin D deficiency 02/09/2014   Anxiety state 01/04/2014   Clinical depression 01/04/2014   Bilateral lower extremity edema 01/04/2014   Recurrent major depression in remission (HCC) 01/04/2014    Abdominal pain 11/19/2013   Acquired gastric outlet stenosis 08/27/2013   Adverse effect of salicylate 07/31/2013   Anxiety and depression 08/06/2013   Esophagitis 08/24/2013   H/O gastrointestinal hemorrhage 08/28/2013   H/O gastric ulcer 08/07/2013   Disorder of nutrition 08/05/2013   Headache, migraine 08/25/2013   Calculus of kidney 08/09/2013   OP (osteoporosis) 08/02/2013   Malnourished (HCC) 08/14/2013    Past Surgical History:  Procedure Laterality Date   ABDOMINAL SURGERY     Reconstructed stomach and 3 different surgeries.    APPENDECTOMY     BREAST SURGERY     cyst removal   CATARACT EXTRACTION W/PHACO Right 06/05/2015   Procedure: CATARACT EXTRACTION PHACO AND INTRAOCULAR LENS PLACEMENT (IOC);  Surgeon: Lockie Mola, MD;  Location: [facility] ORS;  Service: Ophthalmology;  Laterality: Right;  [place] 01:06    CATARACT EXTRACTION W/PHACO Left 04/15/2018   Procedure: CATARACT EXTRACTION PHACO AND INTRAOCULAR LENS PLACEMENT (IOC) LEFT;  Surgeon: Lockie Mola, MD;  Location: [facility];  Service: Ophthalmology;  Laterality: Left;   CESAREAN SECTION     x 3   CHOLECYSTECTOMY     COLONOSCOPY  ESOPHAGOGASTRODUODENOSCOPY     INTRAMEDULLARY (IM) NAIL INTERTROCHANTERIC Left 12/29/2016   Procedure: INTRAMEDULLARY (IM) NAIL INTERTROCHANTRIC;  Surgeon: Hessie Knows, MD;  Location: [facility] ORS;  Service: Orthopedics;  Laterality: Left;   KYPHOPLASTY     KYPHOPLASTY     LITHOTRIPSY     and stone extraction    Prior to Admission medications   Medication Sig Start Date End Date Taking? Authorizing Provider  acetaminophen (TYLENOL) 325 MG tablet Take 2 tablets (650 mg total) by mouth every 6 (six) hours as needed for mild pain (or Fever >/= 101). 12/31/16  Yes Dustin Flock, MD  ALPRAZolam Duanne Moron) 0.25 MG tablet Take 0.25 mg by mouth every 6 (six) hours as needed. 05/14/21  Yes [provider]  ARIPiprazole (ABILIFY) 10 MG tablet Take 10 mg by mouth daily.    Yes [provider]  DULoxetine (CYMBALTA) 30 MG capsule Take 30 mg by mouth daily.    Yes [provider]  Melatonin 10 MG CAPS Take 10 mg by mouth at bedtime as needed.   Yes [provider]  Multiple Vitamin (MULTIVITAMIN) tablet Take 1 tablet by mouth daily.   Yes [provider]  pantoprazole (PROTONIX) 40 MG tablet Take 40 mg by mouth daily.  09/08/14  Yes [provider]  SUMAtriptan (IMITREX) 100 MG tablet 100 mg every 2 (two) hours as needed.  12/30/14  Yes [provider]  traMADol (ULTRAM) 50 MG tablet Take 75 mg by mouth at bedtime. 11/28/20  Yes [provider]  traZODone (DESYREL) 50 MG tablet Take 150 mg by mouth at bedtime. 08/27/19  Yes [provider]  atorvastatin (LIPITOR) 20 MG tablet Take 20 mg by mouth at bedtime. Patient not taking: Reported on 06/13/2021 08/27/19   [provider]  calcium-vitamin D (OSCAL WITH D) 500-200 MG-UNIT tablet Take 1 tablet by mouth daily with breakfast. Patient not taking: Reported on 06/13/2021    [provider]  furosemide (LASIX) 40 MG tablet Take 40 mg by mouth every morning. Patient not taking: Reported on 06/13/2021 08/27/19   [provider]  gabapentin (NEURONTIN) 300 MG capsule Take 300 mg by mouth in the morning, at noon, and at bedtime. Patient not taking: No sig reported 01/27/20   [provider]  latanoprost (XALATAN) 0.005 % ophthalmic solution Place 1 drop into both eyes at bedtime. Patient not taking: No sig reported 11/18/14   [provider]  loperamide (IMODIUM A-D) 2 MG tablet Take 2 mg by mouth 3 (three) times daily as needed for diarrhea or loose stools. Patient not taking: No sig reported    [provider]  MYRBETRIQ 25 MG TB24 tablet Take 25 mg by mouth daily. Patient not taking: No sig reported 08/27/19   [provider]  ondansetron (ZOFRAN) 4 MG tablet Take 4 mg by mouth every 8 (eight) hours  as needed for nausea or vomiting. Patient not taking: Reported on 06/13/2021    [provider]  [place] (OYSTER CALCIUM) 500 MG TABS tablet Take 500 mg of elemental calcium by mouth daily. Patient not taking: Reported on 06/13/2021    [provider]  polyethylene glycol (MIRALAX / GLYCOLAX) packet Take 17 g by mouth daily as needed. Patient not taking: Reported on 06/13/2021    [provider]  potassium chloride (K-DUR,KLOR-CON) 10 MEQ tablet Take 1 tablet by mouth daily. Patient not taking: Reported on 06/13/2021 04/14/17   [provider]  potassium chloride (KLOR-CON) 10 MEQ tablet Take  10 mEq by mouth daily. Patient not taking: Reported on 06/13/2021 12/22/20   [provider]  promethazine (PHENERGAN) 25 MG tablet Take 25 mg by mouth every 6 (six) hours as needed for nausea or vomiting. Patient not taking: No sig reported    [provider]  vitamin B-12 (CYANOCOBALAMIN) 500 MCG tablet Take 500 mcg by mouth daily. Patient not taking: Reported on 06/13/2021    [provider]    Allergies Penicillins, Aspirin, Ferrous gluconate, Iron polysaccharide, Nsaids, Other, Sulfamethoxazole-trimethoprim, Tolmetin, and Venofer [iron sucrose]  Family History  Problem Relation Age of Onset   Breast cancer Mother    Lung cancer Father     Social History Social History   Tobacco Use   Smoking status: Former    Types: Cigarettes    Quit date: 11/29/1993    Years since quitting: 27.5   Smokeless tobacco: Never  Vaping Use   Vaping Use: Never used  Substance Use Topics   Alcohol use: No    Alcohol/week: 1.0 standard drink    Types: 1 Standard drinks or equivalent per week   Drug use: No    Review of Systems  Constitutional: No fever/chills Eyes: No visual changes. ENT: No sore throat. Cardiovascular: Denies chest pain. Respiratory: Denies shortness of breath. Gastrointestinal: No abdominal pain.  No nausea, no  vomiting.  No diarrhea.  No constipation. Genitourinary: Negative for dysuria. Musculoskeletal: Negative for back pain. Skin: Negative for rash. Neurological: Negative for headaches, focal weakness or numbness. Psychiatric: dementia ____________________________________________   PHYSICAL EXAM:  VITAL SIGNS: ED Triage Vitals  Enc Vitals Group     BP 06/13/21 0836 111/69     Pulse Rate 06/13/21 0836 95     Resp 06/13/21 0836 18     Temp 06/13/21 0836 98 F (36.7 C)     Temp Source 06/13/21 0836 Oral     SpO2 06/13/21 0836 99 %     Weight 06/13/21 0838 118 lb (53.5 kg)     Height 06/13/21 0838 5\' 2"  (1.575 m)     Head Circumference --      Peak Flow --      Pain Score 06/13/21 0837 10     Pain Loc --      Pain Edu? --      Excl. in [place]? --     Constitutional: Alert and oriented. Well appearing and in no acute distress. Alert to person Eyes: Conjunctivae are normal. PERRL. EOMI. Head: Atraumatic. Nose: No congestion/rhinnorhea. Mouth/Throat: Mucous membranes are moist.  Oropharynx non-erythematous. Neck: No stridor.   Hematological/Lymphatic/Immunilogical: No cervical lymphadenopathy. Cardiovascular: Normal rate, regular rhythm. Grossly normal heart sounds.  Good peripheral circulation. Respiratory: Normal respiratory effort.  No retractions. Lungs CTAB. Gastrointestinal: Soft and nontender. No distention. No abdominal bruits. No CVA tenderness. Musculoskeletal: No lower extremity tenderness nor edema.  No joint effusions. Neurologic:  Normal speech and language. No gross focal neurologic deficits are appreciated.  Skin:  Skin is warm, dry and intact. No rash noted. Psychiatric: Mood and affect are normal. Speech and behavior are normal.  ____________________________________________   LABS (all labs ordered are listed, but only abnormal results are displayed)  Labs Reviewed  BASIC METABOLIC PANEL - Abnormal; Notable for the following components:      Result Value    Glucose, Bld 105 (*)    All other components within normal limits  CBC WITH DIFFERENTIAL/PLATELET - Abnormal; Notable for the following components:   WBC 10.9 (*)    Hemoglobin  15.1 (*)    HCT 46.5 (*)    Neutro Abs 8.8 (*)    All other components within normal limits  URINALYSIS, ROUTINE W REFLEX MICROSCOPIC - Abnormal; Notable for the following components:   Color, Urine YELLOW (*)    APPearance HAZY (*)    Protein, ur 30 (*)    Nitrite POSITIVE (*)    Leukocytes,Ua MODERATE (*)    WBC, UA >50 (*)    Bacteria, UA MANY (*)    All other components within normal limits  COMPREHENSIVE METABOLIC PANEL - Abnormal; Notable for the following components:   Glucose, Bld 109 (*)    Calcium 8.4 (*)    Total Protein 6.0 (*)    Albumin 3.2 (*)    All other components within normal limits  CBC - Abnormal; Notable for the following components:   WBC 15.6 (*)    All other components within normal limits  TROPONIN I (HIGH SENSITIVITY) - Abnormal; Notable for the following components:   Troponin I (High Sensitivity) 18 (*)    All other components within normal limits  RESP PANEL BY RT-PCR (FLU A&B, COVID) ARPGX2  URINE CULTURE  LACTIC ACID, PLASMA  BRAIN NATRIURETIC PEPTIDE  CK  PROTIME-INR  APTT  MAGNESIUM  PHOSPHORUS  TSH  VITAMIN B12  VITAMIN D 25 HYDROXY (VIT D DEFICIENCY, FRACTURES)  FOLATE RBC  TROPONIN I (HIGH SENSITIVITY)  TROPONIN I (HIGH SENSITIVITY)   ____________________________________________  EKG  Right atrial enlargement Inferior infarct , age undetermined Cannot rule out Anterior infarct , age undetermined Abnormal ECG No STEMI ____________________________________________  RADIOLOGY I, Melvenia Needles, personally viewed and evaluated these images (plain radiographs) as part of my medical decision making, as well as reviewing the written report by the radiologist.  ED MD interpretation:  agree with reports  Official radiology report(s): CT HEAD WO  CONTRAST (5MM)  Result Date: 06/13/2021 CLINICAL DATA:  Unwitnessed fall. EXAM: CT HEAD WITHOUT CONTRAST TECHNIQUE: Contiguous axial images were obtained from the base of the skull through the vertex without intravenous contrast. COMPARISON:  Head CT 09/08/2019 FINDINGS: Brain: Stable degree of atrophy and chronic small vessel ischemia. No intracranial hemorrhage, mass effect, or midline shift. No hydrocephalus. The basilar cisterns are patent. No evidence of territorial infarct or acute ischemia. No extra-axial or intracranial fluid collection. Vascular: Atherosclerosis of skullbase vasculature without hyperdense vessel or abnormal calcification. Skull: No fracture or focal lesion.  Heterogeneous marrow. Sinuses/Orbits: No acute findings. No mastoid effusion. Bilateral cataract resection. Other: No confluent scalp contusion. IMPRESSION: 1. No acute intracranial abnormality. No skull fracture. 2. Stable atrophy and chronic small vessel ischemia. Electronically Signed   By: Keith Rake M.D.   On: 06/13/2021 16:26   CT Cervical Spine Wo Contrast  Result Date: 06/13/2021 CLINICAL DATA:  Unwitnessed fall at home. Neck trauma (Age >= 65y) EXAM: CT CERVICAL SPINE WITHOUT CONTRAST TECHNIQUE: Multidetector CT imaging of the cervical spine was performed without intravenous contrast. Multiplanar CT image reconstructions were also generated. COMPARISON:  CT cervical spine 09/08/2019 FINDINGS: Alignment: No traumatic subluxation. There is stable grade 1 anterolisthesis of C3 on C4 and trace anterolisthesis of C7 on T1. Skull base and vertebrae: No acute fracture. Vertebral body heights are maintained. The dens and skull base are intact. Stable bone island within left C2 and right C4 lamina. Degenerative pannus at C1-C2. Soft tissues and spinal canal: No prevertebral fluid or swelling. No visible canal hematoma. Disc levels: Diffuse degenerative disc disease and facet hypertrophy. Degenerative changes are  stable  from 2021 exam. Upper chest: Biapical pleuroparenchymal scarring. No acute findings. Other: None. IMPRESSION: 1. No acute fracture or subluxation of the cervical spine. 2. Multilevel degenerative disc disease and facet hypertrophy throughout the cervical spine, unchanged from 2021 exam. Electronically Signed   By: Keith Rake M.D.   On: 06/13/2021 16:30    ____________________________________________   PROCEDURES  Procedure(s) performed (including Critical Care):  Procedures  Nitrofurantoin 100 mg PO Tramadol 100 mg PO  ____________________________________________   INITIAL IMPRESSION / ASSESSMENT AND PLAN / ED COURSE  As part of my medical decision making, I reviewed the following data within the [place] History obtained from family, Labs reviewed as noted, EKG interpreted NSR, Radiograph reviewed as noted, A consult was requested and obtained from this/these consultant(s) Medicine, and Notes from prior ED visits  ---------------------------------------- 11:47 AM on 06/13/2021 ----------------------------------------- Maurice Small, LCSW present to consult with patient/husband.   ----------------------------------------- 4:07 PM on 06/13/2021 ----------------------------------------- Patient with ED evaluation by PT/OT. Recommendations pending. Patient husband aware of pending recommendations.   Plan for admission to hospitalist service for management of UTI. Patient will be put up for SNF placement.   ----------------------------------------- 4:28 PM on 06/13/2021 ----------------------------------------- Dr. Madaline Brilliant will admit the patient to the hospital service for management of her UTI and altered mental status with dementia at baseline.  Patient and husband are aware at this time.  Differential diagnosis includes, but is not limited to, alcohol, illicit or prescription medications, or other toxic ingestion; intracranial pathology such as stroke or  intracerebral hemorrhage; fever or infectious causes including sepsis; hypoxemia and/or hypercarbia; uremia; trauma; endocrine related disorders such as diabetes, hypoglycemia, and thyroid-related diseases; hypertensive encephalopathy; etc.  Geriatric patient with ED evaluation decreased activity and request for placement.  Patient presents after a mechanical fall out of her bed, presents via EMS.  She is evaluated for complaints, and found to have reassuring work-up with exception of a UA which does show signs of UTI.  No sepsis, elevated white blood cell count, or abnormalities at this time.  Patient has a mild bump in her troponin but denies any chest pain at this time.  She has negative imaging of the head, neck, chest, and right upper extremities with.  Patient with multiple drug sensitivities, initially given a dose of Macrobid.  Subsequent dose of fosfomycin is provided after pharmacy consultation.  Patient is also evaluated by PT, OT, and social work to make contact with the patient's husband for placement needs.  Patient will be admitted to the hospital at this time for continued management and SNF placement. ____________________________________________   FINAL CLINICAL IMPRESSION(S) / ED DIAGNOSES  Final diagnoses:  Altered mental status, unspecified altered mental status type  Acute cystitis without hematuria     ED Discharge Orders     None        Note:  This document was prepared using Dragon voice recognition software and may include unintentional dictation errors.    Melvenia Needles, PA-C 06/13/21 1631    Melvenia Needles, PA-C 06/14/21 1119    Lucrezia Starch, MD 06/14/21 1451

## 2021-06-13 NOTE — Evaluation (Signed)
Physical Therapy Evaluation Patient Details Name: Ashley Young MRN: [id number] DOB: [birth year] Today's Date: 06/13/2021  History of Present Illness  80 y.o. female with below medical history including Alzheimer's dementia, failure to thrive, and poor mobility presents to the ED after unwitnessed fall.  Clinical Impression  Pt lethargic t/o the session, though she was able to give some effort with cuing and encouragement t/o the PT exam.  She was unable to state her name and fell asleep a few times t/o the session, but did show some effort and AROM intermittently with repeated cuing when awake.  Husband reports that she had not walked since her fall in September and has been falling/sliding to the floor more and more recently.  She struggled with maintaining balance sitting EOB (leaning to the R) but did show effort and active movement when asked to get up, though ultimately she needed a lot of assist.  Husband is interested in seeing if she can make some gains at rehab and at least get back to assisting with transfers if not getting back to limited walking.  Pt's cognitive status/lethargy a big limiter today.     Recommendations for follow up therapy are one component of a multi-disciplinary discharge planning process, led by the attending physician.  Recommendations may be updated based on patient status, additional functional criteria and insurance authorization.  Follow Up Recommendations Skilled nursing-short term rehab (<3 hours/day)    Assistance Recommended at Discharge Frequent or constant Supervision/Assistance  Functional Status Assessment Patient has had a recent decline in their functional status and/or demonstrates limited ability to make significant improvements in function in a reasonable and predictable amount of time  Equipment Recommendations  Rolling walker (2 wheels)    Recommendations for Other Services       Precautions / Restrictions Precautions Precautions:  Fall Restrictions Weight Bearing Restrictions: No      Mobility  Bed Mobility Overal bed mobility: Needs Assistance Bed Mobility: Sidelying to Sit;Supine to Sit   Sidelying to sit: Mod assist;Max assist Supine to sit: Max assist     General bed mobility comments: Pt was able to ease LEs toward EOB and showed some effort in trying to get herself upright sitting but did need considerable assist and consistent cuing to do even this.  Heavy lean to the R t/o the effort despite verbal and tactile cues to adjust/use UEs to support trunk    Transfers                   General transfer comment: unable/unsafe to attempt    Ambulation/Gait                  Stairs            Wheelchair Mobility    Modified Rankin (Stroke Patients Only)       Balance Overall balance assessment: Needs assistance Sitting-balance support: Bilateral upper extremity supported Sitting balance-Leahy Scale: Poor Sitting balance - Comments: heavy lean to R in sitting, unable to adjust/show appropriate righting                                     Pertinent Vitals/Pain Pain Assessment:  (able to indicate some general pain, no specifics)    Home Living Family/patient expects to be discharged to:: Skilled nursing facility Living Arrangements: Spouse/significant other Available Help at Discharge: Available 24 hours/day;Personal care attendant (~20 hrs of aides/wk)  Home Access: Ramped entrance         Home Equipment: Rolling Walker (2 wheels);Cane - single point;Wheelchair - manual      Prior Function Prior Level of Function : Needs assist             Mobility Comments: Pt apparently has not walked since her fall/ED in September       Hand Dominance        Extremity/Trunk Assessment   Upper Extremity Assessment Upper Extremity Assessment: Generalized weakness;Difficult to assess due to impaired cognition (Pt was able to lift each arm minimally  AROM with cuing, difficulty following more than single step cues, very weak b/l)    Lower Extremity Assessment Lower Extremity Assessment: Generalized weakness;Difficult to assess due to impaired cognition (R LE grossly 3-/5, L grossly 2/5 - limited AROM despite much cuing)       Communication   Communication:  (limited verbalization)  Cognition Arousal/Alertness: Lethargic Behavior During Therapy: Flat affect;Anxious Overall Cognitive Status: History of cognitive impairments - at baseline                                          General Comments      Exercises     Assessment/Plan    PT Assessment Patient needs continued PT services  PT Problem List Decreased strength;Decreased range of motion;Decreased activity tolerance;Decreased balance;Decreased mobility;Decreased coordination;Decreased cognition;Decreased knowledge of use of DME;Decreased safety awareness       PT Treatment Interventions DME instruction;Gait training;Functional mobility training;Therapeutic activities;Therapeutic exercise;Balance training;Cognitive remediation;Patient/family education;Wheelchair mobility training;Neuromuscular re-education    PT Goals (Current goals can be found in the Care Plan section)  Acute Rehab PT Goals Patient Stated Goal: husband wants to take pt home but fearful he may not be able to take care of her in current state PT Goal Formulation: With family Time For Goal Achievement: 06/27/21 Potential to Achieve Goals: Fair    Frequency Min 2X/week   Barriers to discharge        Co-evaluation               AM-PAC PT "6 Clicks" Mobility  Outcome Measure Help needed turning from your back to your side while in a flat bed without using bedrails?: A Lot Help needed moving from lying on your back to sitting on the side of a flat bed without using bedrails?: Total Help needed moving to and from a bed to a chair (including a wheelchair)?: Total Help needed  standing up from a chair using your arms (e.g., wheelchair or bedside chair)?: Total Help needed to walk in hospital room?: Total Help needed climbing 3-5 steps with a railing? : Total 6 Click Score: 7    End of Session   Activity Tolerance: Patient limited by lethargy Patient left: in bed;with call bell/phone within reach Nurse Communication: Mobility status PT Visit Diagnosis: Muscle weakness (generalized) (M62.81);Difficulty in walking, not elsewhere classified (R26.2);Repeated falls (R29.6);Adult, failure to thrive (R62.7)    Time: 2703-5009 PT Time Calculation (min) (ACUTE ONLY): 37 min   Charges:   PT Evaluation $PT Eval Moderate Complexity: 1 Mod PT Treatments $Therapeutic Activity: 8-22 mins        Malachi Pro, DPT 06/13/2021, 5:56 PM

## 2021-06-13 NOTE — ED Triage Notes (Signed)
Pt to ED via [facility] from home for an unwitnessed fall. Per EMS pt was found by husband this AM on the floor beside bed. LKW is 3 am this morning per husband.   Pt husband is caregiver, states she fell in September and has declined ever since. Pt husband expressing he is now unable to care for her due to increase care and is requesting SW/ SNF Placement.   Pt c/o generalized pain Pt has hx of dementia; and chronic pain.  Pt is not on blood thinners.

## 2021-06-13 NOTE — ED Notes (Signed)
Pt changed into new gown, brief and chucks.

## 2021-06-13 NOTE — ED Notes (Signed)
Pt here for eval

## 2021-06-13 NOTE — H&P (Addendum)
History and Physical    Ashley Young [id number] DOB: [birth year] DOA: 06/13/2021  PCP: Merlene Laughter, MD   Patient coming from: Home  I have personally briefly reviewed patient's old medical records in [facility]  Chief Complaint: Unwitnessed fall.  HPI: Ashley Young is a 80 y.o. female with PMH significant of Alzheimer's dementia, depression, GERD, iron deficiency anemia, osteoporosis, vitamin B12 deficiency, presented in the ED s/p unwitnessed fall.  Patient is unable to provide history due to dementia.  History is obtained from husband and ED chart.  Husband reports patient presented in the hospital in September 2022 s/p fall, and was discharged home with home health services.  Patient has not fully recovered after that fall, she has multiple falls during that timeframe.  She fell from bed several times and also fell from wheelchair.  Today morning she fell from bed and remained on the floor for unknown duration.  Husband reports that he was unable to get her out of the floor, EMS was called and patient was placed in the bed.  Patient was brought in the ED.  She is at her baseline mental status.  Husband denies any other symptoms, cough, fever, chills, recent travel, sick contacts.  ED Course: Patient is hemodynamically stable in the ED. Vitals; HR 82, RR 19, BP 111/57, SPO2 96% on room air, temp 98.4. Labs include sodium 139, potassium 3.6, chloride 103, bicarb 28, glucose 105, BUN 15, creatinine 0.67, calcium 9.5, BNP 29.5, Trope 16> 18, lactic acid 1.1, WBC 10.9, hemoglobin 15.51, hematocrit 46.5, MCV 94.1, platelet 328, UA moderate leukocytes, nitrites+, bacteria many. CT head no acute abnormality.  CT cervical spine no subluxation or dislocation noted.  Chronic degenerative changes.  X-ray chest no acute infiltrate or abnormality. X-ray right shoulder no dislocation or subluxation.  X-ray right wrist no osseous abnormality of right wrist.  Review of  Systems:  Review of Systems  Constitutional: Negative.   HENT: Negative.    Eyes: Negative.   Respiratory: Negative.    Cardiovascular: Negative.   Gastrointestinal: Negative.   Genitourinary:  Positive for frequency.  Musculoskeletal:  Positive for back pain, falls and neck pain.  Skin: Negative.   Neurological:  Positive for weakness.  Endo/Heme/Allergies: Negative.   Psychiatric/Behavioral:  Positive for depression and memory loss. The patient has insomnia.     Past Medical History:  Diagnosis Date   Arthritis    B12 deficiency    CHF (congestive heart failure) (HCC)    Chronic systolic heart failure (HCC)    Dementia (HCC)    Depression    Dysrhythmia    TACHYCARDIA   Failure to thrive (0-17)    GERD (gastroesophageal reflux disease)    Glaucoma    Headache    MIGRAINES (now approx 1x/month)   History of kidney stones    Hypercholesterolemia    IDA (iron deficiency anemia)    Migraine    Mood disorder ([place])    Multiple gastric ulcers    Osteoporosis    Renal disorder    Multiple episodes of kidney stones.    Wears dentures    full upper and lower    Past Surgical History:  Procedure Laterality Date   ABDOMINAL SURGERY     Reconstructed stomach and 3 different surgeries.    APPENDECTOMY     BREAST SURGERY     cyst removal   CATARACT EXTRACTION W/PHACO Right 06/05/2015   Procedure: CATARACT EXTRACTION PHACO AND INTRAOCULAR LENS PLACEMENT ([facility]);  Surgeon: Leandrew Koyanagi, MD;  Location: [facility];  Service: Ophthalmology;  Laterality: Right;  [place] 01:06    CATARACT EXTRACTION W/PHACO Left 04/15/2018   Procedure: CATARACT EXTRACTION PHACO AND INTRAOCULAR LENS PLACEMENT ([place]) LEFT;  Surgeon: Leandrew Koyanagi, MD;  Location: [facility];  Service: Ophthalmology;  Laterality: Left;   CESAREAN SECTION     x 3   CHOLECYSTECTOMY     COLONOSCOPY     ESOPHAGOGASTRODUODENOSCOPY     INTRAMEDULLARY (IM) NAIL INTERTROCHANTERIC Left 12/29/2016   Procedure:  INTRAMEDULLARY (IM) NAIL INTERTROCHANTRIC;  Surgeon: Hessie Knows, MD;  Location: [facility];  Service: Orthopedics;  Laterality: Left;   KYPHOPLASTY     KYPHOPLASTY     LITHOTRIPSY     and stone extraction     reports that she quit smoking about 27 years ago. Her smoking use included cigarettes. She has never used smokeless tobacco. She reports that she does not drink alcohol and does not use drugs.  Allergies  Allergen Reactions   Penicillins Swelling and Rash   Aspirin Other (See Comments)    Stomach ulcers, bleeding  GI bleed   Ferrous Gluconate Nausea Only and Other (See Comments)    Cannot take in IV form   Iron Polysaccharide Rash   Nsaids Rash and Other (See Comments)    Severe GI upset and prior ulcer   Other Rash and Other (See Comments)    Venofen- unknown   Sulfamethoxazole-Trimethoprim Rash   Tolmetin Rash    Severe GI upset and prior ulcer   Venofer [Iron Sucrose] Rash    Family History  Problem Relation Age of Onset   Breast cancer Mother    Lung cancer Father    Family history reviewed and not pertinent .  Prior to Admission medications   Medication Sig Start Date End Date Taking? Authorizing Provider  acetaminophen (TYLENOL) 325 MG tablet Take 2 tablets (650 mg total) by mouth every 6 (six) hours as needed for mild pain (or Fever >/= 101). 12/31/16   Dustin Flock, MD  ARIPiprazole (ABILIFY) 5 MG tablet Take 5 mg by mouth 2 (two) times daily.    [provider]  atorvastatin (LIPITOR) 20 MG tablet Take 20 mg by mouth at bedtime. 08/27/19   [provider]  calcium-vitamin D (OSCAL WITH D) 500-200 MG-UNIT tablet Take 1 tablet by mouth daily with breakfast.    [provider]  DULoxetine (CYMBALTA) 30 MG capsule Take 30 mg by mouth daily.     [provider]  furosemide (LASIX) 40 MG tablet Take 40 mg by mouth every morning. 08/27/19   [provider]  gabapentin (NEURONTIN) 300 MG capsule Take 300 mg by mouth in the  morning, at noon, and at bedtime. 01/27/20   [provider]  latanoprost (XALATAN) 0.005 % ophthalmic solution Place 1 drop into both eyes at bedtime. 11/18/14   [provider]  loperamide (IMODIUM A-D) 2 MG tablet Take 2 mg by mouth 3 (three) times daily as needed for diarrhea or loose stools.    [provider]  Multiple Vitamin (MULTIVITAMIN) tablet Take 1 tablet by mouth daily.    [provider]  MYRBETRIQ 25 MG TB24 tablet Take 25 mg by mouth daily. 08/27/19   [provider]  ondansetron (ZOFRAN) 4 MG tablet Take 4 mg by mouth every 8 (eight) hours as needed for nausea or vomiting.    [provider]  [place] (OYSTER CALCIUM) 500 MG TABS tablet Take 500 mg  of elemental calcium by mouth daily.    [provider]  pantoprazole (PROTONIX) 40 MG tablet Take 40 mg by mouth daily.  09/08/14   [provider]  polyethylene glycol (MIRALAX / GLYCOLAX) packet Take 17 g by mouth daily as needed.    [provider]  potassium chloride (K-DUR,KLOR-CON) 10 MEQ tablet Take 1 tablet by mouth daily. 04/14/17   [provider]  potassium chloride (KLOR-CON) 10 MEQ tablet Take 10 mEq by mouth daily. 12/22/20   [provider]  promethazine (PHENERGAN) 25 MG tablet Take 25 mg by mouth every 6 (six) hours as needed for nausea or vomiting.    [provider]  SUMAtriptan (IMITREX) 100 MG tablet 100 mg every 2 (two) hours as needed.  12/30/14   [provider]  traMADol (ULTRAM) 50 MG tablet Take 2 tablets by mouth at bedtime. 11/28/20   [provider]  traZODone (DESYREL) 50 MG tablet Take 25 mg by mouth at bedtime. 08/27/19   [provider]  vitamin B-12 (CYANOCOBALAMIN) 500 MCG tablet Take 500 mcg by mouth daily.    [provider]    Physical Exam: Vitals:   06/13/21 1100 06/13/21 1222 06/13/21 1300 06/13/21 1330  BP: (!) 111/58 107/63 113/61 (!) 111/57  Pulse: 84 74     Resp: 18 20 17 19   Temp:      TempSrc:      SpO2: 94% 96%    Weight:      Height:        Constitutional: Appears comfortable, not in any acute distress.  Deconditioned. Vitals:   06/13/21 1100 06/13/21 1222 06/13/21 1300 06/13/21 1330  BP: (!) 111/58 107/63 113/61 (!) 111/57  Pulse: 84 74    Resp: 18 20 17 19   Temp:      TempSrc:      SpO2: 94% 96%    Weight:      Height:       Eyes: PERRL, lids and conjunctivae normal ENMT: Mucous membranes are moist.  No exudate, no erythema..Normal dentition.  Neck: normal, supple, no masses, no thyromegaly Respiratory: Clear to auscultation bilaterally, no wheezing, no crackles. Cardiovascular: S1-S2 heard, regular rate and rhythm, no murmur. Abdomen: Abdomen is soft, nontender, nondistended, BS+ Musculoskeletal: No joint deformity upper and lower extremities. Good ROM, no contractures. Normal muscle tone.  Skin: no rashes, lesions, ulcers. No induration Neurologic: CN 2-12 grossly intact. Sensation intact, DTR normal. Strength 5/5 in all 4.  Psychiatric: Normal judgment and insight. Alert and oriented x 3. Normal mood.     Labs on Admission: I have personally reviewed following labs and imaging studies  CBC: Recent Labs  Lab 06/13/21 0858  WBC 10.9*  NEUTROABS 8.8*  HGB 15.1*  HCT 46.5*  MCV 94.1  PLT [id number]   Basic Metabolic Panel: Recent Labs  Lab 06/13/21 0858  NA 139  K 3.6  CL 103  CO2 28  GLUCOSE 105*  BUN 15  CREATININE 0.76  CALCIUM 9.5   GFR: Estimated Creatinine Clearance: 42.9 mL/min (by C-G formula based on SCr of 0.76 mg/dL). Liver Function Tests: No results for input(s): AST, ALT, ALKPHOS, BILITOT, PROT, ALBUMIN in the last 168 hours. No results for input(s): LIPASE, AMYLASE in the last 168 hours. No results for input(s): AMMONIA in the last 168 hours. Coagulation Profile: No results for input(s): INR, PROTIME in the last 168 hours. Cardiac Enzymes: Recent Labs  Lab 06/13/21 1234  CKTOTAL 124    BNP (last  3 results) No results for input(s): PROBNP in the last 8760 hours. HbA1C: No results for input(s): HGBA1C in the last 72 hours. CBG: No results for input(s): GLUCAP in the last 168 hours. Lipid Profile: No results for input(s): CHOL, HDL, LDLCALC, TRIG, CHOLHDL, LDLDIRECT in the last 72 hours. Thyroid Function Tests: No results for input(s): TSH, T4TOTAL, FREET4, T3FREE, THYROIDAB in the last 72 hours. Anemia Panel: No results for input(s): VITAMINB12, FOLATE, FERRITIN, TIBC, IRON, RETICCTPCT in the last 72 hours. Urine analysis:    Component Value Date/Time   COLORURINE YELLOW (A) 06/13/2021 0858   APPEARANCEUR HAZY (A) 06/13/2021 0858   APPEARANCEUR Clear 01/10/2015 0843   LABSPEC 1.014 06/13/2021 0858   LABSPEC 1.018 11/09/2014 1637   PHURINE 5.0 06/13/2021 0858   GLUCOSEU NEGATIVE 06/13/2021 0858   GLUCOSEU Negative 11/09/2014 1637   HGBUR NEGATIVE 06/13/2021 0858   BILIRUBINUR NEGATIVE 06/13/2021 0858   BILIRUBINUR Negative 01/10/2015 0843   BILIRUBINUR Negative 11/09/2014 1637   KETONESUR NEGATIVE 06/13/2021 0858   PROTEINUR 30 (A) 06/13/2021 0858   NITRITE POSITIVE (A) 06/13/2021 0858   LEUKOCYTESUR MODERATE (A) 06/13/2021 0858   LEUKOCYTESUR 3+ 11/09/2014 1637    Radiological Exams on Admission: CT HEAD WO CONTRAST ( )  Result Date: 06/13/2021 CLINICAL DATA:  Unwitnessed fall. EXAM: CT HEAD WITHOUT CONTRAST TECHNIQUE: Contiguous axial images were obtained from the base of the skull through the vertex without intravenous contrast. COMPARISON:  Head CT 09/08/2019 FINDINGS: Brain: Stable degree of atrophy and chronic small vessel ischemia. No intracranial hemorrhage, mass effect, or midline shift. No hydrocephalus. The basilar cisterns are patent. No evidence of territorial infarct or acute ischemia. No extra-axial or intracranial fluid collection. Vascular: Atherosclerosis of skullbase vasculature without hyperdense vessel or abnormal calcification. Skull:  No fracture or focal lesion.  Heterogeneous marrow. Sinuses/Orbits: No acute findings. No mastoid effusion. Bilateral cataract resection. Other: No confluent scalp contusion. IMPRESSION: 1. No acute intracranial abnormality. No skull fracture. 2. Stable atrophy and chronic small vessel ischemia. Electronically Signed   By: Narda Rutherford M.D.   On: 06/13/2021 16:26   CT Cervical Spine Wo Contrast  Result Date: 06/13/2021 CLINICAL DATA:  Unwitnessed fall at home. Neck trauma (Age >= 65y) EXAM: CT CERVICAL SPINE WITHOUT CONTRAST TECHNIQUE: Multidetector CT imaging of the cervical spine was performed without intravenous contrast. Multiplanar CT image reconstructions were also generated. COMPARISON:  CT cervical spine 09/08/2019 FINDINGS: Alignment: No traumatic subluxation. There is stable grade 1 anterolisthesis of C3 on C4 and trace anterolisthesis of C7 on T1. Skull base and vertebrae: No acute fracture. Vertebral body heights are maintained. The dens and skull base are intact. Stable bone island within left C2 and right C4 lamina. Degenerative pannus at C1-C2. Soft tissues and spinal canal: No prevertebral fluid or swelling. No visible canal hematoma. Disc levels: Diffuse degenerative disc disease and facet hypertrophy. Degenerative changes are stable from 2021 exam. Upper chest: Biapical pleuroparenchymal scarring. No acute findings. Other: None. IMPRESSION: 1. No acute fracture or subluxation of the cervical spine. 2. Multilevel degenerative disc disease and facet hypertrophy throughout the cervical spine, unchanged from 2021 exam. Electronically Signed   By: Narda Rutherford M.D.   On: 06/13/2021 16:30   DG Chest Portable 1 View  Result Date: 06/13/2021 CLINICAL DATA:  Fall, weakness EXAM: PORTABLE CHEST 1 VIEW COMPARISON:  09/08/2019 FINDINGS: Patient is rotated. Heart size is upper limits of normal, unchanged. Chronically coarsened interstitial markings bilaterally. No focal airspace consolidation.  No pleural effusion or pneumothorax. Bones are  demineralized. No acute bony findings are evident. IMPRESSION: No active disease. Electronically Signed   By: Davina Poke D.O.   On: 06/13/2021 09:35   DG Shoulder Right Portable  Result Date: 06/13/2021 CLINICAL DATA:  Fall, right shoulder pain EXAM: PORTABLE RIGHT SHOULDER COMPARISON:  None. FINDINGS: There is no acute fracture or dislocation. Glenohumeral and acromioclavicular alignment is normal. There is mild degenerative change about the [facility] joint. The bones are diffusely demineralized. IMPRESSION: No acute fracture or dislocation. Electronically Signed   By: Valetta Mole M.D.   On: 06/13/2021 09:36   DG Hand Complete Right  Result Date: 06/13/2021 CLINICAL DATA:  Status post fall, pain EXAM: RIGHT HAND - COMPLETE 3+ VIEW COMPARISON:  None. FINDINGS: No acute fracture or dislocation. No aggressive osseous lesion. Normal alignment. Generalized osteopenia. 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 Mild osteoarthritis of the first MCP joint. Soft tissue are unremarkable. No radiopaque foreign body or soft tissue emphysema. IMPRESSION: No acute osseous injury of the right wrist. Electronically Signed   By: Kathreen Devoid M.D.   On: 06/13/2021 09:35    EKG: Independently reviewed.  Normal sinus rhythm, right atrial enlargement.  Assessment/Plan Principal Problem:   UTI (urinary tract infection) Active Problems:   DDD (degenerative disc disease), lumbar   Anxiety and depression   H/O gastrointestinal hemorrhage   OP (osteoporosis)   B12 deficiency   Restless legs syndrome   Chronic systolic CHF (congestive heart failure) (HCC)   Gastroesophageal reflux disease   Chronic pain syndrome   Senile dementia without behavioral disturbance (HCC)   S/P Unwitnessed fall; Patient presented with unwitnessed fall, remain on the floor for unknown duration. Husband was unable to get her out of the floor.  EMS was called. Husband reports  recurrent falls at home. Skeletal work-up and CT head and CT C-spine unremarkable. Husband is unable to take care of her by self. PT and OT consult, TOC consult for placement.  UTI: She is found to have a UA consistent with UTI. Patient does have allergy to penicillin and other antibiotics. Pharmacy consulted. patient is going to get fosfomycin 1 dose. Follow urine cultures.  Patient does not meet sepsis criteria. Lactic acid 1.1.  Normal  Elevated troponin: Patient denies any chest pain,  this could be due to the fall. EKG sinus tachycardia, right atrial enlargement.  Dementia: Stable, no behavioral problems.  Depression: continue Cymbalta, Abilify.  Anxiety disorder: Continue Xanax  Hyperlipidemia: Continue atorvastatin  DVT prophylaxis: SCDs Code Status: DNR Family Communication:  Husband at bed side. Disposition Plan:   Status is: Observation  The patient remains OBS appropriate and will d/c before 2 midnights.  Patient is status post unwitnessed fall.  Husband unable to take care of her patient needs PT and OT evaluation and possible placement in the memory care unit.     Consults called: None Admission status: observation   Shawna Clamp MD [facility]   If 7PM-7AM, please contact night-coverage   06/13/2021, 5:10 PM

## 2021-06-13 NOTE — TOC Initial Note (Signed)
Transition of Care [facility]) - Initial/Assessment Note    Patient Details  Name: Ashley Young MRN: [id number] Date of Birth: 1940-11-15  Transition of Care [facility]) CM/SW Contact:    Marina Goodell Phone Number: [phone] 06/13/2021, 2:22 PM  Clinical Narrative:                  Patient presents to [facility] due to unwitnessed fall.  Patient has dx of dementia.  Patient needs assistance with all ADLs.  Patient's main care giver is Rene, [place]oulin (Spouse) [phone].  Patient lives at home and has a private duty care giver which comes for 20 hours a week.  CSW discussed at length the different options available for the patient w/ Mr. Sundby, including SNF, memory care and returning home with more private care.  Mr. Bulls stated if the patient returned home, he would need a hospital bed.  Mr. Smestad stated he would be happy to wait for the PT evaluation and then he would make a decision as to disposition.  CSW stated I would contact Mr. Chrissie Noa once Pt made their recommendations.  Mr. Frayne verbalized understanding.  Expected Discharge Plan: Skilled Nursing Facility Barriers to Discharge: No Barriers Identified   Patient Goals and CMS Choice        Expected Discharge Plan and Services Expected Discharge Plan: Skilled Nursing Facility In-house Referral: Clinical Social Work   Post Acute Care Choice: Skilled Nursing Facility Living arrangements for the past 2 months: Single Family Home                                      Prior Living Arrangements/Services Living arrangements for the past 2 months: Single Family Home Lives with:: Spouse Dianca, Owensby (Spouse)   [phone] (Mobile)) Patient language and need for interpreter reviewed:: Yes Do you feel safe going back to the place where you live?: Yes      Need for Family Participation in Patient Care: Yes (Comment) Care giver support system in place?: Yes (comment)   Criminal Activity/Legal  Involvement Pertinent to Current Situation/Hospitalization: No - Comment as needed  Activities of Daily Living      Permission Sought/Granted Permission sought to share information with : Family Supports Permission granted to share information with : Yes, Verbal Permission Granted  Share Information with NAME: Meshawn, Oconnor (Spouse)   [phone] (Mobile)           Emotional Assessment Appearance:: Appears stated age Attitude/Demeanor/Rapport: Unable to Assess Affect (typically observed): Unable to Assess Orientation: : Oriented to Self Alcohol / Substance Use: Not Applicable Psych Involvement: No (comment)  Admission diagnosis:  Fall, EMS Patient Active Problem List   Diagnosis Date Noted   Senile dementia without behavioral disturbance (HCC) 02/13/2021   Closed compression fracture of L5 lumbar vertebra, sequela 01/04/2021   History of fracture of hips (Bilateral) 12/05/2020   Major depressive disorder, single episode, unspecified 11/23/2020   Abnormal gait 11/23/2020   Abnormal levels of other serum enzymes 11/23/2020   Allergic rhinitis 11/23/2020   Alzheimer's disease (HCC) 11/23/2020   Dementia (HCC) 11/23/2020   Congestive heart failure (HCC) 11/23/2020   History of cataract extraction 11/23/2020   Hyperlipidemia 11/23/2020   Hypocalcemia 11/23/2020   Idiopathic peripheral autonomic neuropathy 11/23/2020   Impaired glucose tolerance 11/23/2020   Insomnia 11/23/2020   Other elevated white blood cell count 11/23/2020   Leukocytosis 11/23/2020   Macrocytosis 11/23/2020  Mild recurrent major depression (HCC) 11/23/2020   Osteoarthritis 11/23/2020   Thrombocytosis 11/23/2020   Urge incontinence of urine 11/23/2020   Urinary incontinence 11/23/2020   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 11/23/2020   Chronic systolic CHF (congestive heart failure) (HCC) 11/23/2020   Gastroesophageal reflux disease 11/23/2020   Migraine 11/23/2020    Iron deficiency anemia 11/23/2020   Chronic pain syndrome 11/23/2020   Pharmacologic therapy 11/23/2020   Disorder of skeletal system 11/23/2020   Problems influencing health status 11/23/2020   Chronic hip pain (Bilateral) 11/23/2020   Chronic hand pain (Bilateral) 11/23/2020   Ulnar deviation of fingers of hands (Bilateral) 11/23/2020   Chronic low back pain (Bilateral) w/ sciatica (Bilateral) 11/23/2020   Confusion 05/02/2020   Difficulty sleeping 05/02/2020   Memory loss or impairment 12/21/2019   Other fatigue 12/21/2019   Leg cramping 10/27/2019   Chronic lower extremity pain (1ry area of Pain) (Bilateral) 10/27/2019   Restless legs syndrome 10/27/2019   Pulmonary hypertension, mild (HCC) 05/12/2017   Mood disorder (HCC)    DNR (do not resuscitate) 12/30/2016   Hip fracture, left (HCC)    Palliative care encounter    Hypokalemia 12/27/2016   Severe recurrent major depression without psychotic features (HCC) 12/24/2016   Failure to thrive (0-17) 12/24/2016   Closed fracture of ischium (HCC) 07/16/2016   Encounter for general adult medical examination without abnormal findings 05/06/2016   Chronic systolic heart failure (HCC) 01/12/2016   Borderline diabetes mellitus 01/02/2016   Pure hypercholesterolemia 01/02/2016   Goals of care, counseling/discussion 01/02/2016   Vaccine counseling 01/02/2016   B12 deficiency 04/26/2015   Iron deficiency anemia due to chronic blood loss 04/26/2015   History of anemia 03/28/2015   History of microcytic hypochromic anemia 03/28/2015   Osteoporosis, 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   DDD (degenerative disc disease), lumbar 03/09/2014   Degeneration of intervertebral disc of lumbar region 03/09/2014   Avitaminosis D 02/09/2014   Vitamin D deficiency 02/09/2014   Anxiety state 01/04/2014   Clinical depression 01/04/2014   Bilateral lower extremity edema 01/04/2014   Recurrent major depression in remission (HCC) 01/04/2014   Abdominal pain  11/19/2013   Acquired gastric outlet stenosis 08/03/2013   Adverse effect of salicylate 08/22/2013   Anxiety and depression 08/11/2013   Esophagitis 08/21/2013   H/O gastrointestinal hemorrhage 08/13/2013   H/O gastric ulcer 07/30/2013   Disorder of nutrition 08/26/2013   Headache, migraine 08/08/2013   Calculus of kidney 08/18/2013   OP (osteoporosis) 08/05/2013   Malnourished (HCC) 08/27/2013   PCP:  Angela Cox, MD Pharmacy:   [address] [place], [place] - [address] [place] [address] Phone: [phone] Fax: [phone]  [facility] - [place], [place] - [address] [place] [address] Phone: [phone] Fax: [phone]     Social Determinants of Health (SDOH) Interventions    Readmission Risk Interventions No flowsheet data found.

## 2021-06-14 ENCOUNTER — Encounter: Payer: Self-pay | Admitting: Family Medicine

## 2021-06-14 DIAGNOSIS — B961 Klebsiella pneumoniae [K. pneumoniae] as the cause of diseases classified elsewhere: Secondary | ICD-10-CM | POA: Diagnosis present

## 2021-06-14 DIAGNOSIS — G309 Alzheimer's disease, unspecified: Secondary | ICD-10-CM | POA: Diagnosis present

## 2021-06-14 DIAGNOSIS — E538 Deficiency of other specified B group vitamins: Secondary | ICD-10-CM

## 2021-06-14 DIAGNOSIS — Z888 Allergy status to other drugs, medicaments and biological substances status: Secondary | ICD-10-CM | POA: Diagnosis not present

## 2021-06-14 DIAGNOSIS — R64 Cachexia: Secondary | ICD-10-CM | POA: Diagnosis present

## 2021-06-14 DIAGNOSIS — Y92009 Unspecified place in unspecified non-institutional (private) residence as the place of occurrence of the external cause: Secondary | ICD-10-CM | POA: Diagnosis not present

## 2021-06-14 DIAGNOSIS — F0283 Dementia in other diseases classified elsewhere, unspecified severity, with mood disturbance: Secondary | ICD-10-CM | POA: Diagnosis present

## 2021-06-14 DIAGNOSIS — G2581 Restless legs syndrome: Secondary | ICD-10-CM

## 2021-06-14 DIAGNOSIS — Z20822 Contact with and (suspected) exposure to covid-19: Secondary | ICD-10-CM | POA: Diagnosis present

## 2021-06-14 DIAGNOSIS — Z881 Allergy status to other antibiotic agents status: Secondary | ICD-10-CM | POA: Diagnosis not present

## 2021-06-14 DIAGNOSIS — F0390 Unspecified dementia without behavioral disturbance: Secondary | ICD-10-CM | POA: Diagnosis not present

## 2021-06-14 DIAGNOSIS — L89151 Pressure ulcer of sacral region, stage 1: Secondary | ICD-10-CM | POA: Diagnosis present

## 2021-06-14 DIAGNOSIS — Z8719 Personal history of other diseases of the digestive system: Secondary | ICD-10-CM

## 2021-06-14 DIAGNOSIS — N390 Urinary tract infection, site not specified: Secondary | ICD-10-CM | POA: Diagnosis present

## 2021-06-14 DIAGNOSIS — F32A Depression, unspecified: Secondary | ICD-10-CM

## 2021-06-14 DIAGNOSIS — R296 Repeated falls: Secondary | ICD-10-CM | POA: Diagnosis present

## 2021-06-14 DIAGNOSIS — F419 Anxiety disorder, unspecified: Secondary | ICD-10-CM | POA: Diagnosis present

## 2021-06-14 DIAGNOSIS — R627 Adult failure to thrive: Secondary | ICD-10-CM | POA: Diagnosis present

## 2021-06-14 DIAGNOSIS — Z6821 Body mass index (BMI) 21.0-21.9, adult: Secondary | ICD-10-CM | POA: Diagnosis not present

## 2021-06-14 DIAGNOSIS — G894 Chronic pain syndrome: Secondary | ICD-10-CM | POA: Diagnosis present

## 2021-06-14 DIAGNOSIS — Z886 Allergy status to analgesic agent status: Secondary | ICD-10-CM | POA: Diagnosis not present

## 2021-06-14 DIAGNOSIS — N3000 Acute cystitis without hematuria: Secondary | ICD-10-CM | POA: Diagnosis present

## 2021-06-14 DIAGNOSIS — R4182 Altered mental status, unspecified: Secondary | ICD-10-CM | POA: Diagnosis present

## 2021-06-14 DIAGNOSIS — Z66 Do not resuscitate: Secondary | ICD-10-CM | POA: Diagnosis present

## 2021-06-14 DIAGNOSIS — I5022 Chronic systolic (congestive) heart failure: Secondary | ICD-10-CM | POA: Diagnosis present

## 2021-06-14 DIAGNOSIS — Z880 Allergy status to penicillin: Secondary | ICD-10-CM | POA: Diagnosis not present

## 2021-06-14 DIAGNOSIS — D509 Iron deficiency anemia, unspecified: Secondary | ICD-10-CM | POA: Diagnosis present

## 2021-06-14 DIAGNOSIS — E43 Unspecified severe protein-calorie malnutrition: Secondary | ICD-10-CM | POA: Diagnosis present

## 2021-06-14 DIAGNOSIS — K219 Gastro-esophageal reflux disease without esophagitis: Secondary | ICD-10-CM | POA: Diagnosis present

## 2021-06-14 DIAGNOSIS — W06XXXA Fall from bed, initial encounter: Secondary | ICD-10-CM | POA: Diagnosis present

## 2021-06-14 DIAGNOSIS — R401 Stupor: Secondary | ICD-10-CM | POA: Diagnosis not present

## 2021-06-14 LAB — CBC
HCT: 42.9 % (ref 36.0–46.0)
Hemoglobin: 13.6 g/dL (ref 12.0–15.0)
MCH: 29.9 pg (ref 26.0–34.0)
MCHC: 31.7 g/dL (ref 30.0–36.0)
MCV: 94.3 fL (ref 80.0–100.0)
Platelets: 280 10*3/uL (ref 150–400)
RBC: 4.55 MIL/uL (ref 3.87–5.11)
RDW: 12.9 % (ref 11.5–15.5)
WBC: 15.6 10*3/uL — ABNORMAL HIGH (ref 4.0–10.5)
nRBC: 0 % (ref 0.0–0.2)

## 2021-06-14 LAB — PHOSPHORUS: Phosphorus: 4 mg/dL (ref 2.5–4.6)

## 2021-06-14 LAB — COMPREHENSIVE METABOLIC PANEL
ALT: 18 U/L (ref 0–44)
AST: 19 U/L (ref 15–41)
Albumin: 3.2 g/dL — ABNORMAL LOW (ref 3.5–5.0)
Alkaline Phosphatase: 85 U/L (ref 38–126)
Anion gap: 11 (ref 5–15)
BUN: 18 mg/dL (ref 8–23)
CO2: 24 mmol/L (ref 22–32)
Calcium: 8.4 mg/dL — ABNORMAL LOW (ref 8.9–10.3)
Chloride: 104 mmol/L (ref 98–111)
Creatinine, Ser: 0.56 mg/dL (ref 0.44–1.00)
GFR, Estimated: >60 mL/min (ref >60)
Glucose, Bld: 109 mg/dL — ABNORMAL HIGH (ref 70–99)
Potassium: 3.5 mmol/L (ref 3.5–5.1)
Sodium: 139 mmol/L (ref 135–145)
Total Bilirubin: 0.3 mg/dL (ref 0.3–1.2)
Total Protein: 6 g/dL — ABNORMAL LOW (ref 6.5–8.1)

## 2021-06-14 LAB — MAGNESIUM: Magnesium: 2 mg/dL (ref 1.7–2.4)

## 2021-06-14 LAB — PROTIME-INR
INR: 1 (ref 0.8–1.2)
Prothrombin Time: 12.8 seconds (ref 11.4–15.2)

## 2021-06-14 LAB — VITAMIN B12: Vitamin B-12: 693 pg/mL (ref 180–914)

## 2021-06-14 LAB — APTT: aPTT: 27 seconds (ref 24–36)

## 2021-06-14 LAB — GLUCOSE, CAPILLARY
Glucose-Capillary: 118 mg/dL — ABNORMAL HIGH (ref 70–99)
Glucose-Capillary: 191 mg/dL — ABNORMAL HIGH (ref 70–99)

## 2021-06-14 LAB — TSH: TSH: 1.549 u[IU]/mL (ref 0.350–4.500)

## 2021-06-14 LAB — VITAMIN D 25 HYDROXY (VIT D DEFICIENCY, FRACTURES): Vit D, 25-Hydroxy: 63.02 ng/mL (ref 30–100)

## 2021-06-14 MED ORDER — ARIPIPRAZOLE 10 MG PO TABS
10.0000 mg | ORAL_TABLET | Freq: Every day | ORAL | Status: DC
  Administered 2021-06-14 – 2021-06-19 (×6): 10 mg via ORAL
  Filled 2021-06-14 (×6): qty 1

## 2021-06-14 MED ORDER — IPRATROPIUM-ALBUTEROL 0.5-2.5 (3) MG/3ML IN SOLN: 3.0000 mL | RESPIRATORY_TRACT | Status: DC | PRN

## 2021-06-14 MED ORDER — SENNOSIDES-DOCUSATE SODIUM 8.6-50 MG PO TABS: 1.0000 | ORAL_TABLET | Freq: Every evening | ORAL | Status: DC | PRN

## 2021-06-14 MED ORDER — TRAMADOL HCL 50 MG PO TABS
75.0000 mg | ORAL_TABLET | Freq: Every day | ORAL | Status: DC
  Administered 2021-06-14 – 2021-06-18 (×5): 75 mg via ORAL
  Filled 2021-06-14 (×5): qty 2

## 2021-06-14 MED ORDER — ADULT MULTIVITAMIN W/MINERALS CH
1.0000 | ORAL_TABLET | Freq: Every day | ORAL | Status: DC
  Administered 2021-06-16 – 2021-06-18 (×3): 1 via ORAL
  Filled 2021-06-14 (×3): qty 1

## 2021-06-14 MED ORDER — SODIUM CHLORIDE 0.9 % IV SOLN: INTRAVENOUS | Status: AC

## 2021-06-14 MED ORDER — POTASSIUM CHLORIDE CRYS ER 20 MEQ PO TBCR
40.0000 meq | EXTENDED_RELEASE_TABLET | Freq: Once | ORAL | Status: AC
  Administered 2021-06-14: 11:00:00 40 meq via ORAL
  Filled 2021-06-14: qty 2

## 2021-06-14 MED ORDER — TRAZODONE HCL 50 MG PO TABS: 50.0000 mg | ORAL_TABLET | Freq: Every evening | ORAL | Status: DC | PRN

## 2021-06-14 MED ORDER — BOOST / RESOURCE BREEZE PO LIQD CUSTOM
1.0000 | Freq: Three times a day (TID) | ORAL | Status: DC
  Administered 2021-06-15 – 2021-06-19 (×10): 1 via ORAL

## 2021-06-14 MED ORDER — NITROFURANTOIN MONOHYD MACRO 100 MG PO CAPS
100.0000 mg | ORAL_CAPSULE | Freq: Two times a day (BID) | ORAL | Status: AC
  Administered 2021-06-14 – 2021-06-18 (×9): 100 mg via ORAL
  Filled 2021-06-14 (×9): qty 1

## 2021-06-14 MED ORDER — FOSFOMYCIN TROMETHAMINE 3 G PO PACK
3.0000 g | PACK | Freq: Once | ORAL | Status: DC
  Filled 2021-06-14: qty 3

## 2021-06-14 MED ORDER — OXYCODONE HCL 5 MG PO TABS
5.0000 mg | ORAL_TABLET | ORAL | Status: DC | PRN
  Filled 2021-06-14: qty 1

## 2021-06-14 NOTE — Progress Notes (Signed)
To whom it may concern:  The above named patient has a primary diagnosis of Alzheimer's Dementia that supercedes any psychiatric diagnosis.

## 2021-06-14 NOTE — Progress Notes (Signed)
To whom it may concern:  The above named patient will require a short term nursing home stay -30 days or less for strengthening and rehabilitation.  Plan is for return home.   

## 2021-06-14 NOTE — NC FL2 (Signed)
[place] MEDICAID [id number] LEVEL OF CARE SCREENING TOOL     IDENTIFICATION  Patient Name: Ashley Young Birthdate: [birth year] Sex: female Admission Date (Current Location): 06/13/2021  [employer] and [place] Number:  Chiropodist and Address:  [facility], [address] [place], [place] [address]      Provider Number: [id number]  Attending Physician Name and Address:  Dimple Nanas, MD  Relative Name and Phone Number:  Mystery Schrupp (spouse) [phone]    Current Level of Care: Hospital Recommended Level of Care: Skilled Nursing Facility Prior Approval Number:    Date Approved/Denied:   PASRR Number: pending  Discharge Plan: SNF    Current Diagnoses: Patient Active Problem List   Diagnosis Date Noted   AMS (altered mental status) 06/14/2021   UTI (urinary tract infection) 06/13/2021   Senile dementia without behavioral disturbance (HCC) 02/13/2021   Closed compression fracture of L5 lumbar vertebra, sequela 01/04/2021   History of fracture of hips (Bilateral) 12/05/2020   Major depressive disorder, single episode, unspecified 11/23/2020   Abnormal gait 11/23/2020   Abnormal levels of other serum enzymes 11/23/2020   Allergic rhinitis 11/23/2020   Alzheimer's disease (HCC) 11/23/2020   Dementia (HCC) 11/23/2020   Congestive heart failure (HCC) 11/23/2020   History of cataract extraction 11/23/2020   Hyperlipidemia 11/23/2020   Hypocalcemia 11/23/2020   Idiopathic peripheral autonomic neuropathy 11/23/2020   Impaired glucose tolerance 11/23/2020   Insomnia 11/23/2020   Other elevated white blood cell count 11/23/2020   Leukocytosis 11/23/2020   Macrocytosis 11/23/2020   Mild recurrent major depression (HCC) 11/23/2020   Osteoarthritis 11/23/2020   Thrombocytosis 11/23/2020   Urge incontinence of urine 11/23/2020   Urinary incontinence 11/23/2020   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  11/23/2020   Chronic systolic CHF (congestive heart failure) (HCC) 11/23/2020   Gastroesophageal reflux disease 11/23/2020   Migraine 11/23/2020   Iron deficiency anemia 11/23/2020   Chronic pain syndrome 11/23/2020   Pharmacologic therapy 11/23/2020   Disorder of skeletal system 11/23/2020   Problems influencing health status 11/23/2020   Chronic hip pain (Bilateral) 11/23/2020   Chronic hand pain (Bilateral) 11/23/2020   Ulnar deviation of fingers of hands (Bilateral) 11/23/2020   Chronic low back pain (Bilateral) w/ sciatica (Bilateral) 11/23/2020   Confusion 05/02/2020   Difficulty sleeping 05/02/2020   Memory loss or impairment 12/21/2019   Other fatigue 12/21/2019   Leg cramping 10/27/2019   Chronic lower extremity pain (1ry area of Pain) (Bilateral) 10/27/2019   Restless legs syndrome 10/27/2019   Pulmonary hypertension, mild (HCC) 05/12/2017   Mood disorder (HCC)    DNR (do not resuscitate) 12/30/2016   Hip fracture, left (HCC)    Palliative care encounter    Hypokalemia 12/27/2016   Severe recurrent major depression without psychotic features (HCC) 12/24/2016   Failure to thrive (0-17) 12/24/2016   Closed fracture of ischium (HCC) 07/16/2016   Encounter for general adult medical examination without abnormal findings 05/06/2016   Chronic systolic heart failure (HCC) 01/12/2016   Borderline diabetes mellitus 01/02/2016   Pure hypercholesterolemia 01/02/2016   Goals of care, counseling/discussion 01/02/2016   Vaccine counseling 01/02/2016   B12 deficiency 04/26/2015   Iron deficiency anemia due to chronic blood loss 04/26/2015   History of anemia 03/28/2015   History of microcytic hypochromic anemia 03/28/2015   Osteoporosis, 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   DDD (degenerative disc disease), lumbar 03/09/2014   Degeneration of intervertebral disc of lumbar region 03/09/2014  Avitaminosis D 02/09/2014   Vitamin D deficiency 02/09/2014   Anxiety state 01/04/2014    Clinical depression 01/04/2014   Bilateral lower extremity edema 01/04/2014   Recurrent major depression in remission (HCC) 01/04/2014   Abdominal pain 11/19/2013   Acquired gastric outlet stenosis 14-Sep-2013   Adverse effect of salicylate 09/14/2013   Anxiety and depression 09/14/13   Esophagitis 2013/09/14   H/O gastrointestinal hemorrhage Sep 14, 2013   H/O gastric ulcer 09-14-13   Disorder of nutrition 2013-09-14   Headache, migraine 2013-09-14   Calculus of kidney Sep 14, 2013   OP (osteoporosis) 09/14/13   Malnourished (HCC) 2013-09-14    Orientation RESPIRATION BLADDER Height & Weight     Self  Normal External catheter, Incontinent Weight: 53.5 kg Height:  5\' 2"  (157.5 cm)  BEHAVIORAL SYMPTOMS/MOOD NEUROLOGICAL BOWEL NUTRITION STATUS      Incontinent Diet (see discharge summary)  AMBULATORY STATUS COMMUNICATION OF NEEDS Skin   Extensive Assist Verbally Normal                       Personal Care Assistance Level of Assistance  Bathing, Feeding, Dressing Bathing Assistance: Maximum assistance Feeding assistance: Limited assistance Dressing Assistance: Maximum assistance     Functional Limitations Info  Sight, Hearing, Speech Sight Info: Adequate Hearing Info: Adequate Speech Info: Adequate    SPECIAL CARE FACTORS FREQUENCY  PT (By licensed PT), OT (By licensed OT)     PT Frequency: 5 times per week OT Frequency: 5 times per week            Contractures Contractures Info: Not present    Additional Factors Info  Code Status, Allergies Code Status Info: DNR Allergies Info: PCN, aspirin, ferrous gluconate, Iron polysaccharide, Nsaids, Venofen, Sulfa, tolmetin, venofer           Current Medications (06/14/2021):  This is the current hospital active medication list Current Facility-Administered Medications  Medication Dose Route Frequency Provider Last Rate Last Admin   0.9 %  sodium chloride infusion   Intravenous Continuous 06/16/2021,  MD 50 mL/hr at 06/14/21 0857 New Bag at 06/14/21 0857   acetaminophen (TYLENOL) tablet 650 mg  650 mg Oral Q6H PRN 06/16/21, MD   650 mg at 06/14/21 06/16/21   Or   acetaminophen (TYLENOL) suppository 650 mg  650 mg Rectal Q6H PRN 0947, MD       ALPRAZolam Cipriano Bunker) tablet 0.25 mg  0.25 mg Oral Q6H PRN Prudy Feeler, MD   0.25 mg at 06/14/21 0333   ARIPiprazole (ABILIFY) tablet 10 mg  10 mg Oral Daily Amin, Ankit Chirag, MD   10 mg at 06/14/21 1124   docusate sodium (COLACE) capsule 100 mg  100 mg Oral BID 06/16/21, MD   100 mg at 06/13/21 1937   DULoxetine (CYMBALTA) DR capsule 30 mg  30 mg Oral Daily 06/15/21, MD   30 mg at 06/14/21 1125   ipratropium-albuterol (DUONEB) 0.5-2.5 (3) MG/3ML nebulizer solution 3 mL  3 mL Nebulization Q4H PRN Amin, Ankit Chirag, MD       nitrofurantoin (macrocrystal-monohydrate) (MACROBID) capsule 100 mg  100 mg Oral Q12H Amin, Ankit Chirag, MD   100 mg at 06/14/21 1122   ondansetron (ZOFRAN) tablet 4 mg  4 mg Oral Q6H PRN 06/16/21, MD       Or   ondansetron (ZOFRAN) injection 4 mg  4 mg Intravenous Q6H PRN Cipriano Bunker, MD       oxyCODONE (Oxy IR/ROXICODONE) immediate release  tablet 5 mg  5 mg Oral Q4H PRN Amin, Ankit Chirag, MD       pantoprazole (PROTONIX) EC tablet 40 mg  40 mg Oral Daily Cipriano Bunker, MD   40 mg at 06/14/21 1127   senna-docusate (Senokot-S) tablet 1 tablet  1 tablet Oral QHS PRN Amin, Ankit Chirag, MD       traMADol (ULTRAM) tablet 75 mg  75 mg Oral QHS Amin, Ankit Chirag, MD       traZODone (DESYREL) tablet 150 mg  150 mg Oral QHS Cipriano Bunker, MD   150 mg at 06/13/21 9509     Discharge Medications: Please see discharge summary for a list of discharge medications.  Relevant Imaging Results:  Relevant Lab Results:   Additional Information SS # [id number]  Allayne Butcher, RN

## 2021-06-14 NOTE — Progress Notes (Signed)
PROGRESS NOTE    Ashley Young  YNW:[id number] DOB: [birth year] DOA: 06/13/2021 PCP: Merlene Laughter, MD   Brief Narrative:  74 with history of Alzheimer's dementia, GERD, iron deficiency anemia, depression, osteoporosis, vitamin B12 deficiency come to the ED with unwitnessed fall.  Patient is a poor historian therefore history was provided by the family.  Patient had a similar fall about 2 months ago and was discharged home with home health services but has not fully recovered after the fall.  Has been very unsteady at home.  In the ED trauma work-up including CT head, cervical spine was negative.  X-ray of the chest, right shoulder and right wrist was also unremarkable.  UA was consistent with UTI and was given a dose of fosfomycin but inability tolerate 4 ounces, therefore ordered nitrofurantoin   Assessment & Plan:   Principal Problem:   UTI (urinary tract infection) Active Problems:   DDD (degenerative disc disease), lumbar   Anxiety and depression   H/O gastrointestinal hemorrhage   OP (osteoporosis)   B12 deficiency   Restless legs syndrome   Chronic systolic CHF (congestive heart failure) (HCC)   Gastroesophageal reflux disease   Chronic pain syndrome   Senile dementia without behavioral disturbance ([place])  Unwitnessed fall Ambulatory dysfunction - Unclear etiology.  Likely progression of overall weakness.  CT head, cervical spine is negative.  Also x-ray of the right shoulder and right wrist was also unremarkable. - PT/OT consulted - We will check B12, folate, vitamin D - TSH-normal  Urinary tract infection -Dose of fosfomycin given at the time of admission.  He is allergic to multiple antibiotics.  Follow-up urine cultures.  On empiric nitrofurantoin No evidence of sepsis at the time of admission  Moderate to severe dehydration - Minimal urine output.  Bladder scan shows empty bladder.  Continue IV fluids.  Elevated troponin, minimal - Chest pain-free,  likely from demand ischemia  Alzheimer's dementia - Stable  History of depression - Continue home Cymbalta and Abilify  Hyperlipidemia - Statin  Moderate protein calorie malnutrition - We will consult dietitian   DVT prophylaxis: SCDs Code Status: DNR Family Communication: Husband present at bedside during my evaluation  Patient is still quite confused, and inability tolerate orals.  Continue IV fluids, Accu-Cheks.  Will need PT/OT.  Nutritional status           Body mass index is 21.58 kg/m.           Subjective: Met with husband at bedside who tells me that patient is quite tired this morning.  Recently her p.o. intake has been very poor.    Review of Systems Otherwise negative except as per HPI, including: Difficult to obtain from the patient due to her mentation  Examination:  Constitutional: Not in acute distress, elderly frail.  Cachectic with bilateral temporal wasting Respiratory: Clear to auscultation bilaterally Cardiovascular: Normal sinus rhythm, no rubs Abdomen: Nontender nondistended good bowel sounds Musculoskeletal: No edema noted Skin: No rashes seen Neurologic: Grossly moving all the extremities but unable to get complete neuro exam from her as she is unable to follow all the commands. Psychiatric: Poor judgment and insight.  Alert to her name only  Objective: Vitals:   06/13/21 2200 06/13/21 2245 06/14/21 0000 06/14/21 0112  BP: (!) 86/57 (!) 88/62 91/61 (!) 104/53  Pulse: (!) 50 87 86 87  Resp: 17 (!) $Remo'24 20 16  'NJyAR$ Temp:  98.2 F (36.8 C)  98.2 F (36.8 C)  TempSrc:  Oral  Oral  SpO2: 93% 94% 95% 94%  Weight:      Height:        Intake/Output Summary (Last 24 hours) at 06/14/2021 0739 Last data filed at 06/14/2021 0606 Gross per 24 hour  Intake 50 ml  Output 300 ml  Net -250 ml   Filed Weights   06/13/21 0838  Weight: 53.5 kg     Data Reviewed:   CBC: Recent Labs  Lab 06/13/21 0858 06/14/21 0439  WBC 10.9*  15.6*  NEUTROABS 8.8*  --   HGB 15.1* 13.6  HCT 46.5* 42.9  MCV 94.1 94.3  PLT 328 163   Basic Metabolic Panel: Recent Labs  Lab 06/13/21 0858 06/14/21 0439  NA 139 139  K 3.6 3.5  CL 103 104  CO2 28 24  GLUCOSE 105* 109*  BUN 15 18  CREATININE 0.76 0.56  CALCIUM 9.5 8.4*  MG  --  2.0  PHOS  --  4.0   GFR: Estimated Creatinine Clearance: 42.9 mL/min (by C-G formula based on SCr of 0.56 mg/dL). Liver Function Tests: Recent Labs  Lab 06/14/21 0439  AST 19  ALT 18  ALKPHOS 85  BILITOT 0.3  PROT 6.0*  ALBUMIN 3.2*   No results for input(s): LIPASE, AMYLASE in the last 168 hours. No results for input(s): AMMONIA in the last 168 hours. Coagulation Profile: Recent Labs  Lab 06/14/21 0439  INR 1.0   Cardiac Enzymes: Recent Labs  Lab 06/13/21 1234  CKTOTAL 124   BNP (last 3 results) No results for input(s): PROBNP in the last 8760 hours. HbA1C: No results for input(s): HGBA1C in the last 72 hours. CBG: No results for input(s): GLUCAP in the last 168 hours. Lipid Profile: No results for input(s): CHOL, HDL, LDLCALC, TRIG, CHOLHDL, LDLDIRECT in the last 72 hours. Thyroid Function Tests: No results for input(s): TSH, T4TOTAL, FREET4, T3FREE, THYROIDAB in the last 72 hours. Anemia Panel: No results for input(s): VITAMINB12, FOLATE, FERRITIN, TIBC, IRON, RETICCTPCT in the last 72 hours. Sepsis Labs: Recent Labs  Lab 06/13/21 1136  LATICACIDVEN 1.1    Recent Results (from the past 240 hour(s))  Resp Panel by RT-PCR (Flu A&B, Covid) Nasopharyngeal Swab     Status: None   Collection Time: 06/13/21  6:34 PM   Specimen: Nasopharyngeal Swab; Nasopharyngeal(NP) swabs in vial transport medium  Result Value Ref Range Status   SARS Coronavirus 2 by RT PCR NEGATIVE NEGATIVE Final    Comment: (NOTE) SARS-CoV-2 target nucleic acids are NOT DETECTED.  The SARS-CoV-2 RNA is generally detectable in upper respiratory specimens during the acute phase of infection. The  lowest concentration of SARS-CoV-2 viral copies this assay can detect is 138 copies/mL. A negative result does not preclude SARS-Cov-2 infection and should not be used as the sole basis for treatment or other patient management decisions. A negative result may occur with  improper specimen collection/handling, submission of specimen other than nasopharyngeal swab, presence of viral mutation(s) within the areas targeted by this assay, and inadequate number of viral copies(<138 copies/mL). A negative result must be combined with clinical observations, patient history, and epidemiological information. The expected result is Negative.  Fact Sheet for Patients:  EntrepreneurPulse.com.au  Fact Sheet for Healthcare Providers:  IncredibleEmployment.be  This test is no t yet approved or cleared by the [place] FDA and  has been authorized for detection and/or diagnosis of SARS-CoV-2 by FDA under an Emergency Use Authorization (EUA). This EUA will remain  in effect (meaning this test can be used)  for the duration of the COVID-19 declaration under Section 564(b)(1) of the Act, 21 U.S.C.section 360bbb-3(b)(1), unless the authorization is terminated  or revoked sooner.       Influenza A by PCR NEGATIVE NEGATIVE Final   Influenza B by PCR NEGATIVE NEGATIVE Final    Comment: (NOTE) The Xpert Xpress SARS-CoV-2/FLU/RSV plus assay is intended as an aid in the diagnosis of influenza from Nasopharyngeal swab specimens and should not be used as a sole basis for treatment. Nasal washings and aspirates are unacceptable for Xpert Xpress SARS-CoV-2/FLU/RSV testing.  Fact Sheet for Patients: EntrepreneurPulse.com.au  Fact Sheet for Healthcare Providers: IncredibleEmployment.be  This test is not yet approved or cleared by the [place] FDA and has been authorized for detection and/or diagnosis of SARS-CoV-2 by FDA under  an Emergency Use Authorization (EUA). This EUA will remain in effect (meaning this test can be used) for the duration of the COVID-19 declaration under Section 564(b)(1) of the Act, 21 U.S.C. section 360bbb-3(b)(1), unless the authorization is terminated or revoked.  Performed at [facility], [address], [place], [place] [address]          Radiology Studies: CT HEAD WO CONTRAST (5MM)  Result Date: 06/13/2021 CLINICAL DATA:  Unwitnessed fall. EXAM: CT HEAD WITHOUT CONTRAST TECHNIQUE: Contiguous axial images were obtained from the base of the skull through the vertex without intravenous contrast. COMPARISON:  Head CT 09/08/2019 FINDINGS: Brain: Stable degree of atrophy and chronic small vessel ischemia. No intracranial hemorrhage, mass effect, or midline shift. No hydrocephalus. The basilar cisterns are patent. No evidence of territorial infarct or acute ischemia. No extra-axial or intracranial fluid collection. Vascular: Atherosclerosis of skullbase vasculature without hyperdense vessel or abnormal calcification. Skull: No fracture or focal lesion.  Heterogeneous marrow. Sinuses/Orbits: No acute findings. No mastoid effusion. Bilateral cataract resection. Other: No confluent scalp contusion. IMPRESSION: 1. No acute intracranial abnormality. No skull fracture. 2. Stable atrophy and chronic small vessel ischemia. Electronically Signed   By: Keith Rake M.D.   On: 06/13/2021 16:26   CT Cervical Spine Wo Contrast  Result Date: 06/13/2021 CLINICAL DATA:  Unwitnessed fall at home. Neck trauma (Age >= 65y) EXAM: CT CERVICAL SPINE WITHOUT CONTRAST TECHNIQUE: Multidetector CT imaging of the cervical spine was performed without intravenous contrast. Multiplanar CT image reconstructions were also generated. COMPARISON:  CT cervical spine 09/08/2019 FINDINGS: Alignment: No traumatic subluxation. There is stable grade 1 anterolisthesis of C3 on C4 and trace anterolisthesis of C7 on T1.  Skull base and vertebrae: No acute fracture. Vertebral body heights are maintained. The dens and skull base are intact. Stable bone island within left C2 and right C4 lamina. Degenerative pannus at C1-C2. Soft tissues and spinal canal: No prevertebral fluid or swelling. No visible canal hematoma. Disc levels: Diffuse degenerative disc disease and facet hypertrophy. Degenerative changes are stable from 2021 exam. Upper chest: Biapical pleuroparenchymal scarring. No acute findings. Other: None. IMPRESSION: 1. No acute fracture or subluxation of the cervical spine. 2. Multilevel degenerative disc disease and facet hypertrophy throughout the cervical spine, unchanged from 2021 exam. Electronically Signed   By: Keith Rake M.D.   On: 06/13/2021 16:30   DG Chest Portable 1 View  Result Date: 06/13/2021 CLINICAL DATA:  Fall, weakness EXAM: PORTABLE CHEST 1 VIEW COMPARISON:  09/08/2019 FINDINGS: Patient is rotated. Heart size is upper limits of normal, unchanged. Chronically coarsened interstitial markings bilaterally. No focal airspace consolidation. No pleural effusion or pneumothorax. Bones are demineralized. No acute bony findings are evident. IMPRESSION:  No active disease. Electronically Signed   By: Davina Poke D.O.   On: 06/13/2021 09:35   DG Shoulder Right Portable  Result Date: 06/13/2021 CLINICAL DATA:  Fall, right shoulder pain EXAM: PORTABLE RIGHT SHOULDER COMPARISON:  None. FINDINGS: There is no acute fracture or dislocation. Glenohumeral and acromioclavicular alignment is normal. There is mild degenerative change about the [facility] joint. The bones are diffusely demineralized. IMPRESSION: No acute fracture or dislocation. Electronically Signed   By: Valetta Mole M.D.   On: 06/13/2021 09:36   DG Hand Complete Right  Result Date: 06/13/2021 CLINICAL DATA:  Status post fall, pain EXAM: RIGHT HAND - COMPLETE 3+ VIEW COMPARISON:  None. FINDINGS: No acute fracture or dislocation. No aggressive  osseous lesion. Normal alignment. Generalized osteopenia. 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 Mild osteoarthritis of the first MCP joint. Soft tissue are unremarkable. No radiopaque foreign body or soft tissue emphysema. IMPRESSION: No acute osseous injury of the right wrist. Electronically Signed   By: Kathreen Devoid M.D.   On: 06/13/2021 09:35        Scheduled Meds:  ARIPiprazole  5 mg Oral BID   atorvastatin  20 mg Oral QHS   docusate sodium  100 mg Oral BID   DULoxetine  30 mg Oral Daily   fosfomycin  3 g Oral Once   pantoprazole  40 mg Oral Daily   traMADol  100 mg Oral QHS   traZODone  150 mg Oral QHS   Continuous Infusions:   LOS: 0 days   Time spent= 35 mins    Susette Seminara Arsenio Loader, MD [facility]  If 7PM-7AM, please contact night-coverage  06/14/2021, 7:39 AM

## 2021-06-14 NOTE — TOC Progression Note (Signed)
Transition of Care [facility]) - Progression Note    Patient Details  Name: Ashley Young MRN: [id number] Date of Birth: [birth year]  Transition of Care [facility]) CM/SW Contact  Shelbie Hutching, RN Phone Number: 06/14/2021, 1:59 PM  Clinical Narrative:    RNCM met with patient's husband at the bedside today to discuss discharge planning.  PT has recommended SNF, husband would like to try for short term rehab and then take the patient home and try to care for her himself with equipment and hired caregivers.  Husband prefers [facility] since [place] is not in-network with [place] no longer is accepting.  RNCM started SNF workup and bed request sent to [facility].     Expected Discharge Plan: [place] Barriers to Discharge: Continued Medical Work up  Expected Discharge Plan and Services Expected Discharge Plan: [place] In-house Referral: Clinical Social Work Discharge Planning Services: CM Consult Post Acute Care Choice: [place] Living arrangements for the past 2 months: Single Family Home                 DME Arranged: N/A DME Agency: NA       HH Arranged: NA           Social Determinants of Health (SDOH) Interventions    Readmission Risk Interventions No flowsheet data found.

## 2021-06-14 NOTE — Evaluation (Signed)
Clinical/Bedside Swallow Evaluation Patient Details  Name: Ashley Young MRN: [id number] Date of Birth: Nov 06, 1940  Today's Date: 06/14/2021 Time: SLP Start Time (ACUTE ONLY): 9169 SLP Stop Time (ACUTE ONLY): 0940 SLP Time Calculation (min) (ACUTE ONLY): 22 min  Past Medical History:  Past Medical History:  Diagnosis Date   Arthritis    B12 deficiency    CHF (congestive heart failure) (HCC)    Chronic systolic heart failure (HCC)    Dementia (HCC)    Depression    Dysrhythmia    TACHYCARDIA   Failure to thrive (0-17)    GERD (gastroesophageal reflux disease)    Glaucoma    Headache    MIGRAINES (now approx 1x/month)   History of kidney stones    Hypercholesterolemia    IDA (iron deficiency anemia)    Migraine    Mood disorder (HCC)    Multiple gastric ulcers    Osteoporosis    Renal disorder    Multiple episodes of kidney stones.    Wears dentures    full upper and lower   Past Surgical History:  Past Surgical History:  Procedure Laterality Date   ABDOMINAL SURGERY     Reconstructed stomach and 3 different surgeries.    APPENDECTOMY     BREAST SURGERY     cyst removal   CATARACT EXTRACTION W/PHACO Right 06/05/2015   Procedure: CATARACT EXTRACTION PHACO AND INTRAOCULAR LENS PLACEMENT (IOC);  Surgeon: Lockie Mola, MD;  Location: [facility] ORS;  Service: Ophthalmology;  Laterality: Right;  [place] 01:06    CATARACT EXTRACTION W/PHACO Left 04/15/2018   Procedure: CATARACT EXTRACTION PHACO AND INTRAOCULAR LENS PLACEMENT (IOC) LEFT;  Surgeon: Lockie Mola, MD;  Location: [facility];  Service: Ophthalmology;  Laterality: Left;   CESAREAN SECTION     x 3   CHOLECYSTECTOMY     COLONOSCOPY     ESOPHAGOGASTRODUODENOSCOPY     INTRAMEDULLARY (IM) NAIL INTERTROCHANTERIC Left 12/29/2016   Procedure: INTRAMEDULLARY (IM) NAIL INTERTROCHANTRIC;  Surgeon: Kennedy Bucker, MD;  Location: [facility] ORS;  Service: Orthopedics;  Laterality: Left;   KYPHOPLASTY      KYPHOPLASTY     LITHOTRIPSY     and stone extraction   HPI:  Per Physician's H&P "JAKEYA Young is a 80 y.o. female with PMH significant of Alzheimer's dementia, depression, GERD, iron deficiency anemia, osteoporosis, vitamin B12 deficiency, presented in the ED s/p unwitnessed fall.  Patient is unable to provide history due to dementia.  History is obtained from husband and ED chart.  Husband reports patient presented in the hospital in September 2022 s/p fall, and was discharged home with home health services.  Patient has not fully recovered after that fall, she has multiple falls during that timeframe.  She fell from bed several times and also fell from wheelchair.  Today morning she fell from bed and remained on the floor for unknown duration.  Husband reports that he was unable to get her out of the floor, EMS was called and patient was placed in the bed.  Patient was brought in the ED.  She is at her baseline mental status.  Husband denies any other symptoms, cough, fever, chills, recent travel, sick contacts."    Assessment / Plan / Recommendation  Clinical Impression  Pt seen for clinical swallowing evaluation. Pt consuming breakfast upon SLP entrance to room. Husband feeding patient. Per husband, pt consumed a regular diet PTA. Pt currently on clear liquid diet. Pt with waxing/waning LOA and overall mental status during evaluation.  Pt given trials of solid, pureed, and thin. Pt presents with s/sx moderate oral dysphagia c/b limited mouth opening to accept trials of pureed and solids. With pureed, pt limited bolus trials to 1/2 teaspoons. With solid, pt held cracker anteriorly between lips eventually tongue thrusting to remove from lips Pharyngeal swallow appeared functional for trials of pureed and thin. No overt s/sx pharyngeal dysphagia. Seemingly timely swallow initiation and seemingly adequate laryngeal elevation to palpation. Voice remained strong/clear. Pharyngeal swallow could not  be clinical assessed with solids.   Recommend full liquid diet with allowance of naturally occuring purees and safe swallowing strategies/aspiration precautions as outlined below. Pt is at increased risk for aspiration/aspiration PNA given mental status, waxing/waning LOA, medical comorbidities, and need for assistance with feeding. Risk appears to be reduced with diet modifications and safe swallowing strategies/aspiration precautions.   Pt's husband educated re: role of SLP, diet recommendations, safe swallowing strategies, and SLP POC. Husband verbalized understanding/agreement. RN also made aware of results, recommendations, safe swallowing strategies/aspiration precautions, and SLP POC.   SLP to f/u per POC clinical swallowing re-assessment for possible diet advancement.  SLP Visit Diagnosis: Dysphagia, oral phase (R13.11)    Aspiration Risk  Mild aspiration risk    Diet Recommendation  (full liquid diet)   Medication Administration: Whole meds with puree Supervision: Full supervision/cueing for compensatory strategies;Staff to assist with self feeding Compensations: Minimize environmental distractions;Slow rate;Small sips/bites (ALERT/AWAKE!) Postural Changes: Seated upright at 90 degrees    Other  Recommendations Oral Care Recommendations: Oral care BID;Staff/trained caregiver to provide oral care    Recommendations for follow up therapy are one component of a multi-disciplinary discharge planning process, led by the attending physician.  Recommendations may be updated based on patient status, additional functional criteria and insurance authorization.  Follow up Recommendations Acute inpatient rehab (3hours/day)      Assistance Recommended at Discharge Frequent or constant Supervision/Assistance  Functional Status Assessment Patient has had a recent decline in their functional status and demonstrates the ability to make significant improvements in function in a reasonable and  predictable amount of time.  Frequency and Duration min 2x/week  1 week       Prognosis Prognosis for Safe Diet Advancement: Fair Barriers to Reach Goals: Cognitive deficits;Behavior      Swallow Study   General Date of Onset: 06/13/21 HPI: Per Physician's H&P "JUDEEN GERALDS is a 80 y.o. female with PMH significant of Alzheimer's dementia, depression, GERD, iron deficiency anemia, osteoporosis, vitamin B12 deficiency, presented in the ED s/p unwitnessed fall.  Patient is unable to provide history due to dementia.  History is obtained from husband and ED chart.  Husband reports patient presented in the hospital in September 2022 s/p fall, and was discharged home with home health services.  Patient has not fully recovered after that fall, she has multiple falls during that timeframe.  She fell from bed several times and also fell from wheelchair.  Today morning she fell from bed and remained on the floor for unknown duration.  Husband reports that he was unable to get her out of the floor, EMS was called and patient was placed in the bed.  Patient was brought in the ED.  She is at her baseline mental status.  Husband denies any other symptoms, cough, fever, chills, recent travel, sick contacts." Type of Study: Bedside Swallow Evaluation Previous Swallow Assessment: unknown Diet Prior to this Study:  (consumed a regular diet PTA per husband; currently, on clear liquid diet) Temperature Spikes Noted:  No Respiratory Status: Room air History of Recent Intubation: No Behavior/Cognition: Lethargic/Drowsy Oral Cavity Assessment: Within Functional Limits Oral Care Completed by SLP: No (pt consuming breakfast upon SLP entrance to room) Oral Cavity - Dentition:  (adequate)    Oral/Motor/Sensory Function Overall Oral Motor/Sensory Function: Within functional limits (no functional deficits appreciated; unable to test due to AMS/lethargy)   Ice Chips     Thin Liquid Thin Liquid: Within functional  limits Presentation: Straw Other Comments: ~4 oz    Puree Puree: Impaired Presentation: Spoon Oral Phase Impairments:  (reduced mouth opening to accept bolus) Oral Phase Functional Implications:  (accepted 1/2 teaspoons at a time)   Solid     Solid: Impaired Oral Phase Impairments:  (reduced mouth opening) Oral Phase Functional Implications:  (held anteriorly between lips with eventually tongue thrust to remove from lips; attempted twice) Pharyngeal Phase Impairments:  (unable to assess)     Clyde Canterbury, M.S., CCC-SLP Speech-Language Pathologist [facility] [phone] (ASCOM)  Woodroe Chen 06/14/2021,11:06 AM

## 2021-06-14 NOTE — Progress Notes (Signed)
   06/14/21 0000  Assess: MEWS Score  BP 91/61  Pulse Rate 86  ECG Heart Rate 88  Resp 20  Level of Consciousness Alert  SpO2 95 %  O2 Device Room Air  Assess: MEWS Score  MEWS Temp 0  MEWS Systolic 1  MEWS Pulse 0  MEWS RR 0  MEWS LOC 0  MEWS Score 1  MEWS Score Color Green  Assess: if the MEWS score is Yellow or Red  Were vital signs taken at a resting state? Yes  Focused Assessment No change from prior assessment  Does the patient meet 2 or more of the SIRS criteria? No  MEWS guidelines implemented *See Row Information* No, previously yellow, continue vital signs every 4 hours (Green upon admission to unit, Yellows in ED)  Assess: SIRS CRITERIA  SIRS Temperature  0  SIRS Pulse 0  SIRS Respirations  0  SIRS WBC 0  SIRS Score Sum  0

## 2021-06-14 NOTE — Evaluation (Signed)
Occupational Therapy Evaluation Patient Details Name: Ashley Young MRN: [id number] DOB: 1941-04-16 Today's Date: 06/14/2021   History of Present Illness 80 y.o. female with below medical history including Alzheimer's dementia, failure to thrive, and poor mobility presents to the ED after unwitnessed fall. UA c/w UTI.   Clinical Impression   Pt seen for OT evaluation this date in setting of acute hospitalization d/t UTI. Pt's spouse reports that pt's mobility has been going downhill since fall in sept 2022. States that he helps her to w/c. States that, while he helps with most LB ADLs, she is usually able to meaningfully contribute to UB ADLs such as grooming and self-feeding. On OT Assessment this date, pt requires MAX A for bed mobility to come to EOB sitting and requires MOD A for supported sitting UB ADLs such as self-feeding. She is only oriented to self and only able to follow ~20% simple one step commands this date. Pt's spouse does report that she has been declining cognitively more-so since September as well, but still today's presentation is worse as she only repeats a few phrases/words including "I can't" and "help please". Pt returned to bed end of session and placed in chair position to stimulate and help regulate sleep/wake cycles. OT provides gentle soft tissue mobilization to R side of cervical region to reduce tension and R lateral flexion of neck. Pt tolerates okay, difficult to be sure given cognitive status. But able to reduce flexion by ~20 degrees and place neck roll and pillow to help support pt in improved positioning. Pt left with all needs met and in reach and spouse present throughout session. Will continue to follow acutely. Anticipate pt will require STR upon d/c from hospital.      Recommendations for follow up therapy are one component of a multi-disciplinary discharge planning process, led by the attending physician.  Recommendations may be updated based on patient  status, additional functional criteria and insurance authorization.   Follow Up Recommendations  Skilled nursing-short term rehab (<3 hours/day)    Assistance Recommended at Discharge Frequent or constant Supervision/Assistance  Functional Status Assessment  Patient has had a recent decline in their functional status and demonstrates the ability to make significant improvements in function in a reasonable and predictable amount of time.  Equipment Recommendations  BSC/3in1    Recommendations for Other Services       Precautions / Restrictions Precautions Precautions: Fall Restrictions Weight Bearing Restrictions: No      Mobility Bed Mobility Overal bed mobility: Needs Assistance Bed Mobility: Supine to Sit;Sit to Supine     Supine to sit: Max assist Sit to supine: Max assist   General bed mobility comments: pt is moderately participatory with bringing her LEs toward EOB when cued, but essentially does not contribute to trunk elevation and just starts repeating "I can't, I can't". OT uses calming techniques and essentially MAX A with use of chuck for scooting, to bring pt to EOB sitting    Transfers                   General transfer comment: deferred, unsafe      Balance Overall balance assessment: Needs assistance Sitting-balance support: Bilateral upper extremity supported Sitting balance-Leahy Scale: Poor Sitting balance - Comments: heavy R leaning, unable to sustain static sit w/o moderate assistance (pillows under R UE to support)       Standing balance comment: unsafe, deferred  ADL either performed or assessed with clinical judgement   ADL                                         General ADL Comments: MOD A for UB ADLs including grooming/feeding and TOTAL A LB ADLs. Bed level.     Vision   Additional Comments: difficult to formally assess, pt does appear to track intermittently when her  visual attn has been attained, but such poor attn to task and visual attn that it's difficult to discern     Perception     Praxis      Pertinent Vitals/Pain Pain Assessment: Faces Faces Pain Scale: Hurts little more Pain Location: neck, grimmaces with gentle mobilization/encouragement to reduce R lateral flexion Pain Descriptors / Indicators: Contraction;Grimacing;Moaning Pain Intervention(s): Monitored during session;Repositioned;Other (comment) (massage to tight connective tissue)     Hand Dominance     Extremity/Trunk Assessment Upper Extremity Assessment Upper Extremity Assessment: Generalized weakness;Difficult to assess due to impaired cognition   Lower Extremity Assessment Lower Extremity Assessment: Generalized weakness;Difficult to assess due to impaired cognition   Cervical / Trunk Assessment Cervical / Trunk Assessment:  (significant R lateral neck flexion, in sitting, significant R lateral lean)   Communication Communication Communication: Other (comment) (limited and repetitive verbalizations including "I can't, no, help please")   Cognition Arousal/Alertness: Lethargic Behavior During Therapy: Flat affect;Anxious Overall Cognitive Status: History of cognitive impairments - at baseline                                 General Comments: pt with repetive speech patterns, oriented to self only. Follows only ~20% simple one step commands with increased processing time and MAX mutlimodal cueing     General Comments       Exercises Other Exercises Other Exercises: OT ed with pt spouse re: different potential outcomes concerning next steps after hospital stay. OT educates that while pt might be able to gain some strength with daily therapy, it's unlikely her motor planning would improve and it would require ongoing encouragement beyond a rehab stay to help pt maintain any strength that might be acquired through rehabilitation.   Shoulder Instructions       Home Living Family/patient expects to be discharged to:: Skilled nursing facility Living Arrangements: Spouse/significant other Available Help at Discharge: Available 24 hours/day;Personal care attendant (~20hrs aide help per week)   Home Access: Ramped entrance                     Home Equipment: [place] (2 wheels);Cane - single point;Wheelchair - manual          Prior Functioning/Environment Prior Level of Function : Needs assist  Cognitive Assist : Mobility (cognitive);ADLs (cognitive) Mobility (Cognitive): Step by step cues ADLs (Cognitive): Intermittent cues Physical Assist : Mobility (physical);ADLs (physical)     Mobility Comments: Pt's spouse reports she has not walked since fall in Sept 2022 ADLs Comments: Spouse States he helps her with bathing, dressing, and toileting. States that she can normally feed and groom herself with cues        OT Problem List: Decreased strength;Decreased activity tolerance;Impaired balance (sitting and/or standing);Decreased cognition;Decreased safety awareness      OT Treatment/Interventions: Self-care/ADL training;Therapeutic exercise;DME and/or AE instruction;Therapeutic activities;Balance training    OT Goals(Current goals can be  found in the care plan section) Acute Rehab OT Goals Patient Stated Goal: none stated by pt, spouse's goal is for pt to get stronger OT Goal Formulation: With family Time For Goal Achievement: 06/28/21 Potential to Achieve Goals: Fair ADL Goals Pt Will Perform Eating: with supervision;with set-up;sitting;with adaptive utensils (cues to sequence, built up handles) Pt Will Perform Grooming: with min assist;sitting Pt Will Transfer to Toilet: with max assist;squat pivot transfer;bedside commode Pt/caregiver will Perform Home Exercise Program: Increased strength;Both right and left upper extremity;With minimal assist  OT Frequency: Min 2X/week   Barriers to D/C:             Co-evaluation              AM-PAC OT "6 Clicks" Daily Activity     Outcome Measure Help from another person eating meals?: A Lot Help from another person taking care of personal grooming?: A Lot Help from another person toileting, which includes using toliet, bedpan, or urinal?: Total Help from another person bathing (including washing, rinsing, drying)?: Total Help from another person to put on and taking off regular upper body clothing?: A Lot Help from another person to put on and taking off regular lower body clothing?: Total 6 Click Score: 9   End of Session Nurse Communication: Mobility status  Activity Tolerance: Patient tolerated treatment well;Other (comment) (somewhat limited d/t fear/anxiety) Patient left: in bed;with call bell/phone within reach;with bed alarm set;with family/visitor present (chair position to stimulate)  OT Visit Diagnosis: Other abnormalities of gait and mobility (R26.89);Muscle weakness (generalized) (M62.81);Other symptoms and signs involving cognitive function;Adult, failure to thrive (R62.7)                Time: 1021-1173 OT Time Calculation (min): 37 min Charges:  OT General Charges $OT Visit: 1 Visit OT Evaluation $OT Eval Moderate Complexity: 1 Mod OT Treatments $Self Care/Home Management : 8-22 mins $Therapeutic Activity: 8-22 mins  Gerrianne Scale, MS, OTR/L ascom [phone] 06/14/21, 4:14 PM

## 2021-06-14 NOTE — Progress Notes (Addendum)
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  GOAL:   Patient will meet greater than or equal to 90% of their needs  MONITOR:   PO intake, Supplement acceptance, Diet advancement, Labs, Weight trends, Skin, I & O's  REASON FOR ASSESSMENT:   Consult Assessment of nutrition requirement/status, Diet education  ASSESSMENT:   Ashley Young is a 80 y.o. female with PMH significant of Alzheimer's dementia, depression, GERD, iron deficiency anemia, osteoporosis, vitamin B12 deficiency, presented in the ED s/p unwitnessed fall.  Patient is unable to provide history due to dementia.  History is obtained from husband and ED chart.  Husband reports patient presented in the hospital in September 2022 s/p fall, and was discharged home with home health services.  Patient has not fully recovered after that fall, she has multiple falls during that timeframe.  She fell from bed several times and also fell from wheelchair.  Today morning she fell from bed and remained on the floor for unknown duration.  Husband reports that he was unable to get her out of the floor, EMS was called and patient was placed in the bed.  Patient was brought in the ED.  She is at her baseline mental status.  Husband denies any other symptoms, cough, fever, chills, recent travel, sick contacts.  Pt admitted with UTI.   Reviewed I/O's: -250 ml x 24 hours   UOP: 300 ml x 24 hours  Spoke with pt and husband at bedside. Pt husband provided history. Pt consumed about 75% of a milkshake from [employer]. PTA pt has a good appetite, consuming  2-3 meals per day (Breakfast: eggs grits, and toast; Lunch: Snack; Dinner: meat, starch, and vegetable). Pt requires careful feeding assistance from husband or aide, but eats well.   Pt husband expresses concern over poor oral intake while in the hospital, which he relates to dementia. He is concerned about full liquid diet. Reviewed SLP recommendations with him and rationale for full liquids today. Pt does not like Ensure or Boost, but amenable to [employer].   Case discussed with SLP regarding husband's concerns about pt being on a full liquid diet. SLP to re-evaluate tomorrow.   Reviewed wt hx; wt has been stable over the past year. Per husband, wt has been stable over the past few years, however, lost a significant amount of weight about 4 years ago, when she first diagnosed with dementia. Her UBW is around 90#.   Medications reviewed and include 0.9% sodium chloride infusion @ 50 ml/hr and colace.   Labs reviewed: CBGS: 118 (inpatient orders for glycemic control are none).    NUTRITION - FOCUSED PHYSICAL EXAM:  Flowsheet Row Most Recent Value  Orbital Region Severe depletion  Upper Arm Region Severe depletion  Thoracic and Lumbar Region Mild depletion  Buccal Region Moderate depletion  Temple Region Severe depletion  Clavicle Bone Region Severe depletion  Clavicle and Acromion Bone Region Severe depletion  Scapular Bone Region Severe depletion  Dorsal Hand Moderate depletion  Patellar Region Moderate depletion  Anterior Thigh Region Moderate depletion  Posterior Calf Region Moderate depletion  Edema (RD Assessment) None  Hair Reviewed  Eyes Reviewed  Mouth Reviewed  Skin Reviewed  Nails Reviewed       Diet Order:   Diet Order             Diet full liquid Room service appropriate? Yes; Fluid consistency: Thin  Diet effective now                   EDUCATION NEEDS:   Education needs have been addressed  Skin:  Skin Assessment: Reviewed RN Assessment  Last  BM:  06/14/21  Height:   Ht Readings from Last 1 Encounters:  06/13/21 5\' 2"  (1.575 m)    Weight:   Wt Readings from Last 1 Encounters:  06/13/21 53.5 kg    Ideal Body Weight:  50 kg  BMI:  Body mass index is 21.58 kg/m.  Estimated Nutritional Needs:   Kcal:  1600-1800  Protein:  80-95 grams  Fluid:  > 1.6 L    06/15/21, RD, LDN, CDCES Registered Dietitian II Certified Diabetes Care and Education Specialist Please refer to [facility] for RD and/or RD on-call/weekend/after hours pager

## 2021-06-15 ENCOUNTER — Inpatient Hospital Stay: Payer: Medicare PPO

## 2021-06-15 DIAGNOSIS — E440 Moderate protein-calorie malnutrition: Secondary | ICD-10-CM

## 2021-06-15 DIAGNOSIS — G894 Chronic pain syndrome: Secondary | ICD-10-CM

## 2021-06-15 DIAGNOSIS — N3000 Acute cystitis without hematuria: Secondary | ICD-10-CM | POA: Diagnosis not present

## 2021-06-15 DIAGNOSIS — R4182 Altered mental status, unspecified: Secondary | ICD-10-CM

## 2021-06-15 DIAGNOSIS — I5022 Chronic systolic (congestive) heart failure: Secondary | ICD-10-CM | POA: Diagnosis not present

## 2021-06-15 DIAGNOSIS — E43 Unspecified severe protein-calorie malnutrition: Secondary | ICD-10-CM | POA: Diagnosis present

## 2021-06-15 LAB — MAGNESIUM: Magnesium: 2.1 mg/dL (ref 1.7–2.4)

## 2021-06-15 LAB — URINE CULTURE: Culture: >=100000 — AB

## 2021-06-15 LAB — BASIC METABOLIC PANEL
Anion gap: 6 (ref 5–15)
BUN: 17 mg/dL (ref 8–23)
CO2: 24 mmol/L (ref 22–32)
Calcium: 8.4 mg/dL — ABNORMAL LOW (ref 8.9–10.3)
Chloride: 106 mmol/L (ref 98–111)
Creatinine, Ser: 0.65 mg/dL (ref 0.44–1.00)
GFR, Estimated: >60 mL/min (ref >60)
Glucose, Bld: 133 mg/dL — ABNORMAL HIGH (ref 70–99)
Potassium: 3.7 mmol/L (ref 3.5–5.1)
Sodium: 136 mmol/L (ref 135–145)

## 2021-06-15 LAB — CBC
HCT: 40.3 % (ref 36.0–46.0)
Hemoglobin: 12.6 g/dL (ref 12.0–15.0)
MCH: 29.4 pg (ref 26.0–34.0)
MCHC: 31.3 g/dL (ref 30.0–36.0)
MCV: 93.9 fL (ref 80.0–100.0)
Platelets: 231 10*3/uL (ref 150–400)
RBC: 4.29 MIL/uL (ref 3.87–5.11)
RDW: 13.1 % (ref 11.5–15.5)
WBC: 12.4 10*3/uL — ABNORMAL HIGH (ref 4.0–10.5)
nRBC: 0 % (ref 0.0–0.2)

## 2021-06-15 LAB — FOLATE RBC
Folate, Hemolysate: 451 ng/mL
Folate, RBC: 1069 ng/mL (ref >498)
Hematocrit: 42.2 % (ref 34.0–46.6)

## 2021-06-15 LAB — GLUCOSE, CAPILLARY: Glucose-Capillary: 150 mg/dL — ABNORMAL HIGH (ref 70–99)

## 2021-06-15 MED ORDER — ACETAMINOPHEN 10 MG/ML IV SOLN
1000.0000 mg | Freq: Once | INTRAVENOUS | Status: AC
  Administered 2021-06-15: 1000 mg via INTRAVENOUS
  Filled 2021-06-15: qty 100

## 2021-06-15 MED ORDER — METOCLOPRAMIDE HCL 5 MG/ML IJ SOLN
10.0000 mg | Freq: Once | INTRAMUSCULAR | Status: AC
  Administered 2021-06-15: 10 mg via INTRAVENOUS
  Filled 2021-06-15: qty 2

## 2021-06-15 MED ORDER — ENOXAPARIN SODIUM 40 MG/0.4ML IJ SOSY
40.0000 mg | PREFILLED_SYRINGE | Freq: Every day | INTRAMUSCULAR | Status: DC
  Administered 2021-06-15 – 2021-06-19 (×5): 40 mg via SUBCUTANEOUS
  Filled 2021-06-15 (×5): qty 0.4

## 2021-06-15 NOTE — Progress Notes (Signed)
PROGRESS NOTE    Ashley Young  ILN:[id number] DOB: 11-09-1940 DOA: 06/13/2021 PCP: Angela Cox, MD   Brief Narrative:  58 with history of Alzheimer's dementia, GERD, iron deficiency anemia, depression, osteoporosis, vitamin B12 deficiency come to the ED with unwitnessed fall.  Patient is a poor historian therefore history was provided by the family.  Patient had a similar fall about 2 months ago and was discharged home with home health services but has not fully recovered after the fall.  Has been very unsteady at home.  In the ED trauma work-up including CT head, cervical spine was negative.  X-ray of the chest, right shoulder and right wrist was also unremarkable.  UA was consistent with UTI and was given a dose of fosfomycin but inability tolerate 4 ounces, therefore ordered nitrofurantoin.  Urine cultures is growing gram-negative rods   Assessment & Plan:   Principal Problem:   UTI (urinary tract infection) Active Problems:   DDD (degenerative disc disease), lumbar   Anxiety and depression   H/O gastrointestinal hemorrhage   OP (osteoporosis)   B12 deficiency   Restless legs syndrome   Chronic systolic CHF (congestive heart failure) (HCC)   Gastroesophageal reflux disease   Chronic pain syndrome   Senile dementia without behavioral disturbance (HCC)   AMS (altered mental status)   Malnutrition of moderate degree  Unwitnessed fall Ambulatory dysfunction - Unclear etiology.  Likely progression of overall weakness.  CT head, cervical spine is negative.  Also x-ray of the right shoulder and right wrist was also unremarkable. - PT/OT consulted-SNF - Vitamin D, B12 TSH-normal  Urinary tract infection, cultures growing gram-negative rods-Klebsiella - Patient is allergic to multiple antibiotics.  Upon admission had received a dose of fosfomycin.  Currently she is on Macrobid  Moderate to severe dehydration - Minimal urine output, bladder is empty on bladder scan.   Continue gentle hydration  Elevated troponin, minimal - Chest pain-free, likely from demand ischemia  Alzheimer's dementia - Stable  History of depression - Continue home Cymbalta and Abilify  Hyperlipidemia - Statin  Nutritional status  Nutrition Problem: Severe Malnutrition Etiology: chronic illness (dementia)  Signs/Symptoms: moderate fat depletion, severe fat depletion, moderate muscle depletion, severe muscle depletion  Interventions: MVI, Boost Plus  Body mass index is 21.58 kg/m.   PT/OT = SNF  DVT prophylaxis: SCDs Code Status: DNR Family Communication: Husband is present at bedside  Patient is still quite deconditioned.  Currently awaiting culture sensitivity data.  PT continues to work with therapy, TOC working on placement    Subjective: Patient overall appears very weak.  Minimal participation in conversation.  Husband is present at bedside during my visit.  Review of Systems Otherwise negative except as per HPI, including: Unable to obtain from the patient  Examination: Constitutional: Not in acute distress, appears chronically ill.  Elderly frail Respiratory: Clear to auscultation bilaterally Cardiovascular: Normal sinus rhythm, no rubs Abdomen: Nontender nondistended good bowel sounds Musculoskeletal: No edema noted Skin: No rashes seen Neurologic: Grossly moving all extremities. Psychiatric: Poor judgment and insight.  Alert to name only Objective: Vitals:   06/14/21 1949 06/14/21 2200 06/15/21 0029 06/15/21 0501  BP:   102/65 (!) 108/38  Pulse: 90  98 92  Resp:   14 16  Temp:  99 F (37.2 C) 98.3 F (36.8 C) 98.7 F (37.1 C)  TempSrc:  Oral  Oral  SpO2:   95% 94%  Weight:      Height:  Intake/Output Summary (Last 24 hours) at 06/15/2021 0739 Last data filed at 06/14/2021 1819 Gross per 24 hour  Intake 240 ml  Output --  Net 240 ml   Filed Weights   06/13/21 0838  Weight: 53.5 kg     Data Reviewed:    CBC: Recent Labs  Lab 06/13/21 0858 06/14/21 0439 06/15/21 0516  WBC 10.9* 15.6* 12.4*  NEUTROABS 8.8*  --   --   HGB 15.1* 13.6 12.6  HCT 46.5* 42.9 40.3  MCV 94.1 94.3 93.9  PLT 328 280 231   Basic Metabolic Panel: Recent Labs  Lab 06/13/21 0858 06/14/21 0439 06/15/21 0516  NA 139 139 136  K 3.6 3.5 3.7  CL 103 104 106  CO2 28 24 24   GLUCOSE 105* 109* 133*  BUN 15 18 17   CREATININE 0.76 0.56 0.65  CALCIUM 9.5 8.4* 8.4*  MG  --  2.0 2.1  PHOS  --  4.0  --    GFR: Estimated Creatinine Clearance: 42.9 mL/min (by C-G formula based on SCr of 0.65 mg/dL). Liver Function Tests: Recent Labs  Lab 06/14/21 0439  AST 19  ALT 18  ALKPHOS 85  BILITOT 0.3  PROT 6.0*  ALBUMIN 3.2*   No results for input(s): LIPASE, AMYLASE in the last 168 hours. No results for input(s): AMMONIA in the last 168 hours. Coagulation Profile: Recent Labs  Lab 06/14/21 0439  INR 1.0   Cardiac Enzymes: Recent Labs  Lab 06/13/21 1234  CKTOTAL 124   BNP (last 3 results) No results for input(s): PROBNP in the last 8760 hours. HbA1C: No results for input(s): HGBA1C in the last 72 hours. CBG: Recent Labs  Lab 06/14/21 1155 06/14/21 1742  GLUCAP 118* 191*   Lipid Profile: No results for input(s): CHOL, HDL, LDLCALC, TRIG, CHOLHDL, LDLDIRECT in the last 72 hours. Thyroid Function Tests: Recent Labs    06/14/21 0852  TSH 1.549   Anemia Panel: Recent Labs    06/14/21 0852  VITAMINB12 693   Sepsis Labs: Recent Labs  Lab 06/13/21 1136  LATICACIDVEN 1.1    Recent Results (from the past 240 hour(s))  Urine Culture     Status: Abnormal (Preliminary result)   Collection Time: 06/13/21  8:50 AM   Specimen: Urine, Random  Result Value Ref Range Status   Specimen Description   Final    URINE, RANDOM Performed at [facility], [address] [place], [address] [place]    Special Requests   Final    NONE Performed at [facility], [address], [place], [address] [place]    Culture (A)  Final    >=100,000 COLONIES/mL [place] NEGATIVE RODS SUSCEPTIBILITIES TO FOLLOW Performed at [facility], [address] [place], [address] [place]    Report Status PENDING  Incomplete  Resp Panel by RT-PCR (Flu A&B, Covid) Nasopharyngeal Swab     Status: None   Collection Time: 06/13/21  6:34 PM   Specimen: Nasopharyngeal Swab; Nasopharyngeal(NP) swabs in vial transport medium  Result Value Ref Range Status   SARS Coronavirus 2 by RT PCR NEGATIVE NEGATIVE Final    Comment: (NOTE) SARS-CoV-2 target nucleic acids are NOT DETECTED.  The SARS-CoV-2 RNA is generally detectable in upper respiratory specimens during the acute phase of infection. The lowest concentration of SARS-CoV-2 viral copies this assay can detect is 138 copies/mL. A negative result does not preclude SARS-Cov-2 infection and should not be used as the sole basis for treatment or other  patient management decisions. A negative result may occur with  improper specimen collection/handling, submission of specimen other than nasopharyngeal swab, presence of viral mutation(s) within the areas targeted by this assay, and inadequate number of viral copies(<138 copies/mL). A negative result must be combined with clinical observations, patient history, and epidemiological information. The expected result is Negative.  Fact Sheet for Patients:  BloggerCourse.com  Fact Sheet for Healthcare Providers:  SeriousBroker.it  This test is no t yet approved or cleared by the [place] FDA and  has been authorized for detection and/or diagnosis of SARS-CoV-2 by FDA under an Emergency Use Authorization (EUA). This EUA will remain  in effect (meaning this test can be used) for the duration of the COVID-19 declaration under Section 564(b)(1) of the Act, 21 U.S.C.section 360bbb-3(b)(1), unless the authorization is terminated  or revoked  sooner.       Influenza A by PCR NEGATIVE NEGATIVE Final   Influenza B by PCR NEGATIVE NEGATIVE Final    Comment: (NOTE) The Xpert Xpress SARS-CoV-2/FLU/RSV plus assay is intended as an aid in the diagnosis of influenza from Nasopharyngeal swab specimens and should not be used as a sole basis for treatment. Nasal washings and aspirates are unacceptable for Xpert Xpress SARS-CoV-2/FLU/RSV testing.  Fact Sheet for Patients: BloggerCourse.com  Fact Sheet for Healthcare Providers: SeriousBroker.it  This test is not yet approved or cleared by the [place] FDA and has been authorized for detection and/or diagnosis of SARS-CoV-2 by FDA under an Emergency Use Authorization (EUA). This EUA will remain in effect (meaning this test can be used) for the duration of the COVID-19 declaration under Section 564(b)(1) of the Act, 21 U.S.C. section 360bbb-3(b)(1), unless the authorization is terminated or revoked.  Performed at [facility], [address], [place], [place] [address]          Radiology Studies: CT HEAD WO CONTRAST ( )  Result Date: 06/13/2021 CLINICAL DATA:  Unwitnessed fall. EXAM: CT HEAD WITHOUT CONTRAST TECHNIQUE: Contiguous axial images were obtained from the base of the skull through the vertex without intravenous contrast. COMPARISON:  Head CT 09/08/2019 FINDINGS: Brain: Stable degree of atrophy and chronic small vessel ischemia. No intracranial hemorrhage, mass effect, or midline shift. No hydrocephalus. The basilar cisterns are patent. No evidence of territorial infarct or acute ischemia. No extra-axial or intracranial fluid collection. Vascular: Atherosclerosis of skullbase vasculature without hyperdense vessel or abnormal calcification. Skull: No fracture or focal lesion.  Heterogeneous marrow. Sinuses/Orbits: No acute findings. No mastoid effusion. Bilateral cataract resection. Other: No confluent  scalp contusion. IMPRESSION: 1. No acute intracranial abnormality. No skull fracture. 2. Stable atrophy and chronic small vessel ischemia. Electronically Signed   By: Narda Rutherford M.D.   On: 06/13/2021 16:26   CT Cervical Spine Wo Contrast  Result Date: 06/13/2021 CLINICAL DATA:  Unwitnessed fall at home. Neck trauma (Age >= 65y) EXAM: CT CERVICAL SPINE WITHOUT CONTRAST TECHNIQUE: Multidetector CT imaging of the cervical spine was performed without intravenous contrast. Multiplanar CT image reconstructions were also generated. COMPARISON:  CT cervical spine 09/08/2019 FINDINGS: Alignment: No traumatic subluxation. There is stable grade 1 anterolisthesis of C3 on C4 and trace anterolisthesis of C7 on T1. Skull base and vertebrae: No acute fracture. Vertebral body heights are maintained. The dens and skull base are intact. Stable bone island within left C2 and right C4 lamina. Degenerative pannus at C1-C2. Soft tissues and spinal canal: No prevertebral fluid or swelling. No visible canal hematoma. Disc levels: Diffuse degenerative disc disease and facet  hypertrophy. Degenerative changes are stable from 2021 exam. Upper chest: Biapical pleuroparenchymal scarring. No acute findings. Other: None. IMPRESSION: 1. No acute fracture or subluxation of the cervical spine. 2. Multilevel degenerative disc disease and facet hypertrophy throughout the cervical spine, unchanged from 2021 exam. Electronically Signed   By: Narda Rutherford M.D.   On: 06/13/2021 16:30   DG Chest Portable 1 View  Result Date: 06/13/2021 CLINICAL DATA:  Fall, weakness EXAM: PORTABLE CHEST 1 VIEW COMPARISON:  09/08/2019 FINDINGS: Patient is rotated. Heart size is upper limits of normal, unchanged. Chronically coarsened interstitial markings bilaterally. No focal airspace consolidation. No pleural effusion or pneumothorax. Bones are demineralized. No acute bony findings are evident. IMPRESSION: No active disease. Electronically Signed   By:  Duanne Guess D.O.   On: 06/13/2021 09:35   DG Shoulder Right Portable  Result Date: 06/13/2021 CLINICAL DATA:  Fall, right shoulder pain EXAM: PORTABLE RIGHT SHOULDER COMPARISON:  None. FINDINGS: There is no acute fracture or dislocation. Glenohumeral and acromioclavicular alignment is normal. There is mild degenerative change about the [facility] joint. The bones are diffusely demineralized. IMPRESSION: No acute fracture or dislocation. Electronically Signed   By: Lesia Hausen M.D.   On: 06/13/2021 09:36   DG Hand Complete Right  Result Date: 06/13/2021 CLINICAL DATA:  Status post fall, pain EXAM: RIGHT HAND - COMPLETE 3+ VIEW COMPARISON:  None. FINDINGS: No acute fracture or dislocation. No aggressive osseous lesion. Normal alignment. Generalized osteopenia. 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 Mild osteoarthritis of the first MCP joint. Soft tissue are unremarkable. No radiopaque foreign body or soft tissue emphysema. IMPRESSION: No acute osseous injury of the right wrist. Electronically Signed   By: Elige Ko M.D.   On: 06/13/2021 09:35        Scheduled Meds:  ARIPiprazole  10 mg Oral Daily   docusate sodium  100 mg Oral BID   DULoxetine  30 mg Oral Daily   feeding supplement  1 Container Oral TID BM   multivitamin with minerals  1 tablet Oral Daily   nitrofurantoin (macrocrystal-monohydrate)  100 mg Oral Q12H   pantoprazole  40 mg Oral Daily   traMADol  75 mg Oral QHS   traZODone  150 mg Oral QHS   Continuous Infusions:   LOS: 1 day   Time spent= 35 mins    Jenese Mischke Joline Maxcy, MD [facility]  If 7PM-7AM, please contact night-coverage  06/15/2021, 7:39 AM

## 2021-06-15 NOTE — TOC Progression Note (Addendum)
Transition of Care [facility]) - Progression Note    Patient Details  Name: Ashley Young MRN: [id number] Date of Birth: 01-May-1941  [facility] CM/SW Contact  Hetty Ely, RN Phone Number: 06/15/2021, 10:28 AM  Clinical Narrative: Verlon Au from [facility] notified and will have a bed on Monday, husband called and consents to offer, Attending also notified Facility will start Authorization.    Expected Discharge Plan: Skilled Nursing Facility Barriers to Discharge: Continued Medical Work up  Expected Discharge Plan and Services Expected Discharge Plan: Skilled Nursing Facility In-house Referral: Clinical Social Work Discharge Planning Services: CM Consult Post Acute Care Choice: Skilled Nursing Facility Living arrangements for the past 2 months: Single Family Home                 DME Arranged: N/A DME Agency: NA       HH Arranged: NA           Social Determinants of Health (SDOH) Interventions    Readmission Risk Interventions No flowsheet data found.

## 2021-06-15 NOTE — Progress Notes (Signed)
Patient's personal care aide at bedside and requesting pain medication for patient. Patient noted to be sleeping peacefully. Aide made aware patient had received scheduled pain medication earlier in the evening. Aide then asked that prn anxiety medication be given. Aide states patient "needs it every night." Patient had to be woken from sleep to take medication.

## 2021-06-15 NOTE — Progress Notes (Signed)
Speech Language Pathology Treatment: Dysphagia  Patient Details Name: Ashley Young MRN: [id number] DOB: 1940/12/02 Today's Date: 06/15/2021 Time: 1230-1310 SLP Time Calculation (min) (ACUTE ONLY): 40 min  Assessment / Plan / Recommendation Clinical Impression  Pt seen for ongoing assessment of swallowing and tolerance for safe diet upgrade to soft solids. Pt appears to present w/ mild oral phase dysphagia in light of declined Cognitive status; Baseline Alzheimer's Dementia. This can impact her overall awareness of po's, mastication of solids, timing of swallow, and safety during po intake which increases risk for aspiration, choking. Husband acknowledged this and stated he was "careful" when feeding her at home. Pt's risk for aspiration is present but can be reduced when following general aspiration precautions and using a modified diet consistency of mech soft/minced meats foods w/ feeding support and Supervision. She required MOD verbal/tactile/visual cues for follow through during po tasks this session d/t Confusion.   Pt consumed several trials of purees, soft solids and thin liquids via Pinched Straw for single sips w/ NO overt clinical s/s of aspiration noted; no decline in vocal quality; no cough, and no decline in respiratory status during/post trials. Oral phase was adequate for bolus management of liquids and purees, but min increased Time for full mastication and oral clearing of the solid boluses was noted. Softened solids were moistened well for ease of mastication which seemed to help. Pt could not self-feeding and required full assistance d/t Cognitive decline. Confusion w/ oral care noted.     D/t pt's Baseline, declined Cognitive status w/ Dementia and her risk for aspiration, recommend a dysphagia level 3(mech soft w/ Meats MINCED, gravies) w/ thin liquids; general aspiration precautions; reduce Distractions during meals and engage pt during po's at meal for self-feeding as  able. Supervision w/ all oral intake. Pills Crushed in Puree for safer swallowing. Support w/ feeding at meals. MD/NSG updated. ST services recommends follow w/ Palliative Care for ongoing education w/ Husband re: impact of Cognitive decline/Dementia on swallowing and oral intake overall. ST services can follow pt at discharge to SNF if further needs. Pt appears close to/at her Baseline per Husband's report. Education given to him on general aspiration precautions; feeding support and cues; food consistency and prep for ease of intake d/t Cognitive decline. Handouts given.      HPI HPI: Per 61 H&P "Ashley Young is a 80 y.o. female with PMH significant of Alzheimer's dementia, depression, GERD, iron deficiency anemia, osteoporosis, vitamin B12 deficiency, presented in the ED s/p unwitnessed fall.  Patient is unable to provide history due to dementia.  History is obtained from husband and ED chart.  Husband reports patient presented in the hospital in September 2022 s/p fall, and was discharged home with home health services.  Patient has not fully recovered after that fall, she has multiple falls during that timeframe.  She fell from bed several times and also fell from wheelchair.  Today morning she fell from bed and remained on the floor for unknown duration.  Husband reports that he was unable to get her out of the floor, EMS was called and patient was placed in the bed.  Patient was brought in the ED.  She is at her baseline mental status.  Husband denies any other symptoms, cough, fever, chills, recent travel, sick contacts."      SLP Plan  All goals met      Recommendations for follow up therapy are one component of a multi-disciplinary discharge planning process, led by the attending  physician.  Recommendations may be updated based on patient status, additional functional criteria and insurance authorization.    Recommendations  Diet recommendations: Dysphagia 3 (mechanical  soft);Dysphagia 2 (fine chop);Thin liquid (meats minced) Liquids provided via: Cup;Straw (monitor) Medication Administration: Crushed with puree (for safer swallowing) Supervision: Staff to assist with self feeding;Full supervision/cueing for compensatory strategies (100% supervision and feeding support) Compensations: Minimize environmental distractions;Slow rate;Small sips/bites;Lingual sweep for clearance of pocketing;Follow solids with liquid Postural Changes and/or Swallow Maneuvers: Out of bed for meals;Seated upright 90 degrees;Upright 30-60 min after meal                General recommendations:  (Palliative Care consult and Dietician f/u -- for support) Oral Care Recommendations: Oral care BID;Oral care before and after PO;Staff/trained caregiver to provide oral care Follow Up Recommendations: Skilled nursing-short term rehab (<3 hours/day) Assistance recommended at discharge: Frequent or constant Supervision/Assistance SLP Visit Diagnosis: Dysphagia, oral phase (R13.11) (impacted by Dementia baseline) Plan: All goals met       [place], [place], CCC-SLP Speech Language Pathologist Rehab Services [phone] [facility]  06/15/2021, 3:53 PM

## 2021-06-16 DIAGNOSIS — R401 Stupor: Secondary | ICD-10-CM | POA: Diagnosis not present

## 2021-06-16 DIAGNOSIS — N3000 Acute cystitis without hematuria: Secondary | ICD-10-CM | POA: Diagnosis not present

## 2021-06-16 LAB — CBC
HCT: 44.1 % (ref 36.0–46.0)
Hemoglobin: 13.4 g/dL (ref 12.0–15.0)
MCH: 29.5 pg (ref 26.0–34.0)
MCHC: 30.4 g/dL (ref 30.0–36.0)
MCV: 96.9 fL (ref 80.0–100.0)
Platelets: 146 10*3/uL — ABNORMAL LOW (ref 150–400)
RBC: 4.55 MIL/uL (ref 3.87–5.11)
RDW: 13.1 % (ref 11.5–15.5)
WBC: 15 10*3/uL — ABNORMAL HIGH (ref 4.0–10.5)
nRBC: 0 % (ref 0.0–0.2)

## 2021-06-16 LAB — BASIC METABOLIC PANEL
Anion gap: 9 (ref 5–15)
BUN: 19 mg/dL (ref 8–23)
CO2: 26 mmol/L (ref 22–32)
Calcium: 8.7 mg/dL — ABNORMAL LOW (ref 8.9–10.3)
Chloride: 105 mmol/L (ref 98–111)
Creatinine, Ser: 0.63 mg/dL (ref 0.44–1.00)
GFR, Estimated: >60 mL/min (ref >60)
Glucose, Bld: 111 mg/dL — ABNORMAL HIGH (ref 70–99)
Potassium: 3.6 mmol/L (ref 3.5–5.1)
Sodium: 140 mmol/L (ref 135–145)

## 2021-06-16 LAB — GLUCOSE, CAPILLARY
Glucose-Capillary: 104 mg/dL — ABNORMAL HIGH (ref 70–99)
Glucose-Capillary: 111 mg/dL — ABNORMAL HIGH (ref 70–99)
Glucose-Capillary: 95 mg/dL (ref 70–99)

## 2021-06-16 LAB — MAGNESIUM: Magnesium: 2 mg/dL (ref 1.7–2.4)

## 2021-06-16 NOTE — Progress Notes (Addendum)
PROGRESS NOTE  Ashley Young IFO:[id number] DOB: 1941/03/17 DOA: 06/13/2021 PCP: Angela Cox, MD  HPI/Recap of past 24 hours:    80 y/o with history of Alzheimer's dementia, GERD, iron deficiency anemia, depression, osteoporosis, vitamin B12 deficiency come to the ED with unwitnessed fall.  Patient is a poor historian therefore history was provided by the family.  Patient had a similar fall about 2 months ago and was discharged home with home health services but has not fully recovered after the fall.  Has been very unsteady at home.  In the ED trauma work-up including CT head, cervical spine was negative.  X-ray of the chest, right shoulder and right wrist was also unremarkable.  UA was consistent with UTI and was given a dose of fosfomycin but inability tolerate 4 ounces, therefore ordered nitrofurantoin.  Urine cultures is growing gram-negative rods  Subjective: Patient seen and examined at bedside with her husband been at bedside Patient's husband stated she has been quite altered but is a little better today she is still refusing to eat  Assessment/Plan: Principal Problem:   UTI (urinary tract infection) Active Problems:   DDD (degenerative disc disease), lumbar   Anxiety and depression   H/O gastrointestinal hemorrhage   OP (osteoporosis)   B12 deficiency   Restless legs syndrome   Chronic systolic CHF (congestive heart failure) (HCC)   Gastroesophageal reflux disease   Chronic pain syndrome   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 Klebsiella Patient is allergic to multiple antibiotics Patient was given fosfomycin on admission Currently on Macrobid.  Moderate to severe dehydration Minimal urine output.  She had a bladder scan that was empty We will continue IV hydration  Elevated troponin This was just minimal.  Patient  did not admit any chest pain Might be due to demand ischemia  Alzheimer's dementia without behavioral Stable  Unwitnessed fall Ambulatory dysfunction.  Husband stated that patient has not been able to walk since March 29, 2021 Patient is waiting to go for rehab PT OT   Code Status: DNR  Severity of Illness: The appropriate patient status for this patient is INPATIENT. Inpatient status is judged to be reasonable and necessary in order to provide the required intensity of service to ensure the patient's safety. The patient's presenting symptoms, physical exam findings, and initial radiographic and laboratory data in the context of their chronic comorbidities is felt to place them at high risk for further clinical deterioration. Furthermore, it is not anticipated that the patient will be medically stable for discharge from the hospital within 2 midnights of admission.  Debility waiting for rehab placement * I certify that at the point of admission it is my clinical judgment that the patient will require inpatient hospital care spanning beyond 2 midnights from the point of admission due to high intensity of service, high risk for further deterioration and high frequency of surveillance required.*   Family Communication: None been at bedside  Disposition Plan: Possible discharge to rehab Status is: Inpatient   Dispo: The patient is from: Home              Anticipated d/c is to:               Anticipated d/c date is:               Patient currently not medically stable for discharge  Consultants:  None  Procedures: None  Antimicrobials: Macrobid  DVT prophylaxis: SCD   Objective: Vitals:   06/15/21 1941 06/16/21 0055 06/16/21 0515 06/16/21 0757  BP: 105/61 (!) 113/57 (!) 113/49 (!) 123/58  Pulse: 97 82 (!) 58 96  Resp: 16 14 16 19   Temp: 99.6 F (37.6 C) 98.1 F (36.7 C) 99.2 F (37.3 C) 100.1 F (37.8 C)  TempSrc:      SpO2: 93% 95% 92% 91%  Weight:      Height:         Intake/Output Summary (Last 24 hours) at 06/16/2021 0919 Last data filed at 06/15/2021 1800 Gross per 24 hour  Intake 57.56 ml  Output --  Net 57.56 ml   Filed Weights   06/13/21 0838  Weight: 53.5 kg   Body mass index is 21.58 kg/m.  Exam:  General: 80 y.o. year-old female well developed well nourished in no acute distress.  Alert and oriented x0 patient responded to verbal stimuli that she is constantly murmuring Cardiovascular: Regular rate and rhythm with no rubs or gallops.  No thyromegaly or JVD noted.   Respiratory: Clear to auscultation with no wheezes or rales. Good inspiratory effort. Abdomen: Soft nontender nondistended with normal bowel sounds x4 quadrants. Musculoskeletal: No lower extremity edema. 2/4 pulses in all 4 extremities. Skin: No ulcerative lesions noted or rashes, Psychiatry: Mood is appropriate for condition and setting Neurology: Altered mental status    Data Reviewed: CBC: Recent Labs  Lab 06/13/21 0858 06/14/21 0439 06/14/21 0852 06/15/21 0516 06/16/21 0442  WBC 10.9* 15.6*  --  12.4* 15.0*  NEUTROABS 8.8*  --   --   --   --   HGB 15.1* 13.6  --  12.6 13.4  HCT 46.5* 42.9 42.2 40.3 44.1  MCV 94.1 94.3  --  93.9 96.9  PLT 328 280  --  231 146*   Basic Metabolic Panel: Recent Labs  Lab 06/13/21 0858 06/14/21 0439 06/15/21 0516 06/16/21 0442  NA 139 139 136 140  K 3.6 3.5 3.7 3.6  CL 103 104 106 105  CO2 28 24 24 26   GLUCOSE 105* 109* 133* 111*  BUN 15 18 17 19   CREATININE 0.76 0.56 0.65 0.63  CALCIUM 9.5 8.4* 8.4* 8.7*  MG  --  2.0 2.1 2.0  PHOS  --  4.0  --   --    GFR: Estimated Creatinine Clearance: 42.9 mL/min (by C-G formula based on SCr of 0.63 mg/dL). Liver Function Tests: Recent Labs  Lab 06/14/21 0439  AST 19  ALT 18  ALKPHOS 85  BILITOT 0.3  PROT 6.0*  ALBUMIN 3.2*   No results for input(s): LIPASE, AMYLASE in the last 168 hours. No results for input(s): AMMONIA in the last 168 hours. Coagulation  Profile: Recent Labs  Lab 06/14/21 0439  INR 1.0   Cardiac Enzymes: Recent Labs  Lab 06/13/21 1234  CKTOTAL 124   BNP (last 3 results) No results for input(s): PROBNP in the last 8760 hours. HbA1C: No results for input(s): HGBA1C in the last 72 hours. CBG: Recent Labs  Lab 06/14/21 1155 06/14/21 1742 06/15/21 1721 06/16/21 0055 06/16/21 0519  GLUCAP 118* 191* 150* 95 104*   Lipid Profile: No results for input(s): CHOL, HDL, LDLCALC, TRIG, CHOLHDL, LDLDIRECT in the last 72 hours. Thyroid Function Tests: Recent Labs    06/14/21 0852  TSH 1.549   Anemia Panel: Recent Labs    06/14/21 0852  VITAMINB12 693   Urine analysis:  Component Value Date/Time   COLORURINE YELLOW (A) 06/13/2021 0858   APPEARANCEUR HAZY (A) 06/13/2021 0858   APPEARANCEUR Clear 01/10/2015 0843   LABSPEC 1.014 06/13/2021 0858   LABSPEC 1.018 11/09/2014 1637   PHURINE 5.0 06/13/2021 0858   GLUCOSEU NEGATIVE 06/13/2021 0858   GLUCOSEU Negative 11/09/2014 1637   HGBUR NEGATIVE 06/13/2021 0858   BILIRUBINUR NEGATIVE 06/13/2021 0858   BILIRUBINUR Negative 01/10/2015 0843   BILIRUBINUR Negative 11/09/2014 1637   KETONESUR NEGATIVE 06/13/2021 0858   PROTEINUR 30 (A) 06/13/2021 0858   NITRITE POSITIVE (A) 06/13/2021 0858   LEUKOCYTESUR MODERATE (A) 06/13/2021 0858   LEUKOCYTESUR 3+ 11/09/2014 1637   Sepsis Labs: @LABRCNTIP (procalcitonin:4,lacticidven:4)  ) Recent Results (from the past 240 hour(s))  Urine Culture     Status: Abnormal   Collection Time: 06/13/21  8:50 AM   Specimen: Urine, Random  Result Value Ref Range Status   Specimen Description   Final    URINE, RANDOM Performed at [facility], [address] [place], [place]    Special Requests   Final    NONE Performed at [facility], [address] [place], [place]    Culture >=100,000 COLONIES/mL KLEBSIELLA PNEUMONIAE (A)  Final   Report Status 06/15/2021 FINAL  Final    Organism ID, Bacteria KLEBSIELLA PNEUMONIAE (A)  Final      Susceptibility   Klebsiella pneumoniae - MIC*    AMPICILLIN RESISTANT Resistant     CEFAZOLIN <=4 SENSITIVE Sensitive     CEFEPIME <=0.12 SENSITIVE Sensitive     CEFTRIAXONE <=0.25 SENSITIVE Sensitive     CIPROFLOXACIN <=0.25 SENSITIVE Sensitive     GENTAMICIN <=1 SENSITIVE Sensitive     IMIPENEM <=0.25 SENSITIVE Sensitive     NITROFURANTOIN <=16 SENSITIVE Sensitive     TRIMETH/SULFA <=20 SENSITIVE Sensitive     AMPICILLIN/SULBACTAM 4 SENSITIVE Sensitive     PIP/TAZO <=4 SENSITIVE Sensitive     * >=100,000 COLONIES/mL KLEBSIELLA PNEUMONIAE  Resp Panel by RT-PCR (Flu A&B, Covid) Nasopharyngeal Swab     Status: None   Collection Time: 06/13/21  6:34 PM   Specimen: Nasopharyngeal Swab; Nasopharyngeal(NP) swabs in vial transport medium  Result Value Ref Range Status   SARS Coronavirus 2 by RT PCR NEGATIVE NEGATIVE Final    Comment: (NOTE) SARS-CoV-2 target nucleic acids are NOT DETECTED.  The SARS-CoV-2 RNA is generally detectable in upper respiratory specimens during the acute phase of infection. The lowest concentration of SARS-CoV-2 viral copies this assay can detect is 138 copies/mL. A negative result does not preclude SARS-Cov-2 infection and should not be used as the sole basis for treatment or other patient management decisions. A negative result may occur with  improper specimen collection/handling, submission of specimen other than nasopharyngeal swab, presence of viral mutation(s) within the areas targeted by this assay, and inadequate number of viral copies(<138 copies/mL). A negative result must be combined with clinical observations, patient history, and epidemiological information. The expected result is Negative.  Fact Sheet for Patients:  06/15/21  Fact Sheet for Healthcare Providers:  BloggerCourse.com  This test is no t yet approved or cleared by  the [place] FDA and  has been authorized for detection and/or diagnosis of SARS-CoV-2 by FDA under an Emergency Use Authorization (EUA). This EUA will remain  in effect (meaning this test can be used) for the duration of the COVID-19 declaration under Section 564(b)(1) of the Act, 21 U.S.C.section 360bbb-3(b)(1), unless the authorization is terminated  or revoked sooner.  Influenza A by PCR NEGATIVE NEGATIVE Final   Influenza B by PCR NEGATIVE NEGATIVE Final    Comment: (NOTE) The Xpert Xpress SARS-CoV-2/FLU/RSV plus assay is intended as an aid in the diagnosis of influenza from Nasopharyngeal swab specimens and should not be used as a sole basis for treatment. Nasal washings and aspirates are unacceptable for Xpert Xpress SARS-CoV-2/FLU/RSV testing.  Fact Sheet for Patients: BloggerCourse.com  Fact Sheet for Healthcare Providers: SeriousBroker.it  This test is not yet approved or cleared by the [place] FDA and has been authorized for detection and/or diagnosis of SARS-CoV-2 by FDA under an Emergency Use Authorization (EUA). This EUA will remain in effect (meaning this test can be used) for the duration of the COVID-19 declaration under Section 564(b)(1) of the Act, 21 U.S.C. section 360bbb-3(b)(1), unless the authorization is terminated or revoked.  Performed at [facility], [address] [place], [place] [address]       Studies: DG Abd 1 View  Result Date: 06/15/2021 CLINICAL DATA:  Abdominal pain and distension, urinary tract infection EXAM: ABDOMEN - 1 VIEW COMPARISON:  01/12/2016 FINDINGS: Supine frontal view of the abdomen and pelvis demonstrates revision of enteric stent overlying left upper quadrant. Numerous surgical clips throughout the abdomen. No bowel obstruction or ileus. Bilateral calcifications overlie the renal silhouettes. On the left, a single 4 mm calculus is suspected. On the  right, there are at least 5 distinct calcifications, largest conglomerate overlying the upper pole of the right renal silhouette measuring up to 6 mm. Stable vertebral augmentation at the lumbosacral junction. Stable postsurgical changes of the bilateral hips. IMPRESSION: 1. Suspected bilateral renal calculi as above. 2. Revision of enteric stent left upper quadrant. 3. Unremarkable bowel gas pattern. Electronically Signed   By: Sharlet Salina M.D.   On: 06/15/2021 17:32    Scheduled Meds:  ARIPiprazole  10 mg Oral Daily   docusate sodium  100 mg Oral BID   DULoxetine  30 mg Oral Daily   enoxaparin (LOVENOX) injection  40 mg Subcutaneous Daily   feeding supplement  1 Container Oral TID BM   multivitamin with minerals  1 tablet Oral Daily   nitrofurantoin (macrocrystal-monohydrate)  100 mg Oral Q12H   pantoprazole  40 mg Oral Daily   traMADol  75 mg Oral QHS   traZODone  150 mg Oral QHS    Continuous Infusions:   LOS: 2 days     Myrtie Neither, MD [facility]  To reach me or the doctor on call, go to: [email or web address] Password [email or web address]  06/16/2021, 9:19 AM

## 2021-06-17 DIAGNOSIS — F0390 Unspecified dementia without behavioral disturbance: Secondary | ICD-10-CM | POA: Diagnosis not present

## 2021-06-17 DIAGNOSIS — E43 Unspecified severe protein-calorie malnutrition: Secondary | ICD-10-CM

## 2021-06-17 DIAGNOSIS — I5022 Chronic systolic (congestive) heart failure: Secondary | ICD-10-CM | POA: Diagnosis not present

## 2021-06-17 DIAGNOSIS — N3000 Acute cystitis without hematuria: Secondary | ICD-10-CM | POA: Diagnosis not present

## 2021-06-17 DIAGNOSIS — E440 Moderate protein-calorie malnutrition: Secondary | ICD-10-CM | POA: Insufficient documentation

## 2021-06-17 LAB — CBC
HCT: 42.7 % (ref 36.0–46.0)
Hemoglobin: 13.4 g/dL (ref 12.0–15.0)
MCH: 29.1 pg (ref 26.0–34.0)
MCHC: 31.4 g/dL (ref 30.0–36.0)
MCV: 92.8 fL (ref 80.0–100.0)
Platelets: 163 10*3/uL (ref 150–400)
RBC: 4.6 MIL/uL (ref 3.87–5.11)
RDW: 13.1 % (ref 11.5–15.5)
WBC: 11.3 10*3/uL — ABNORMAL HIGH (ref 4.0–10.5)
nRBC: 0 % (ref 0.0–0.2)

## 2021-06-17 LAB — BASIC METABOLIC PANEL
Anion gap: 7 (ref 5–15)
BUN: 20 mg/dL (ref 8–23)
CO2: 27 mmol/L (ref 22–32)
Calcium: 8.6 mg/dL — ABNORMAL LOW (ref 8.9–10.3)
Chloride: 103 mmol/L (ref 98–111)
Creatinine, Ser: 0.47 mg/dL (ref 0.44–1.00)
GFR, Estimated: >60 mL/min (ref >60)
Glucose, Bld: 126 mg/dL — ABNORMAL HIGH (ref 70–99)
Potassium: 3.1 mmol/L — ABNORMAL LOW (ref 3.5–5.1)
Sodium: 137 mmol/L (ref 135–145)

## 2021-06-17 LAB — GLUCOSE, CAPILLARY
Glucose-Capillary: 100 mg/dL — ABNORMAL HIGH (ref 70–99)
Glucose-Capillary: 107 mg/dL — ABNORMAL HIGH (ref 70–99)
Glucose-Capillary: 111 mg/dL — ABNORMAL HIGH (ref 70–99)
Glucose-Capillary: 127 mg/dL — ABNORMAL HIGH (ref 70–99)
Glucose-Capillary: 132 mg/dL — ABNORMAL HIGH (ref 70–99)

## 2021-06-17 LAB — MAGNESIUM: Magnesium: 2.1 mg/dL (ref 1.7–2.4)

## 2021-06-17 MED ORDER — POTASSIUM CHLORIDE CRYS ER 20 MEQ PO TBCR
40.0000 meq | EXTENDED_RELEASE_TABLET | Freq: Once | ORAL | Status: AC
  Administered 2021-06-17: 40 meq via ORAL
  Filled 2021-06-17: qty 2

## 2021-06-17 MED ORDER — ALPRAZOLAM 0.25 MG PO TABS: 0.2500 mg | ORAL_TABLET | Freq: Four times a day (QID) | ORAL | 0 refills | Status: DC | PRN

## 2021-06-17 NOTE — Progress Notes (Signed)
Physical Therapy Treatment Patient Details Name: Ashley Young MRN: [id number] DOB: 10-18-1940 Today's Date: 06/17/2021   History of Present Illness 80 y.o. female with below medical history including Alzheimer's dementia, failure to thrive, and poor mobility presents to the ED after unwitnessed fall. UA c/w UTI.    PT Comments    Pt awake, attentive and helpful husband at bedside.  She is able to get to/from EOB with mod/max assist and remain sitting EOB x 10 minutes with mostly min a x 1 with brief periods where she is able to sit unsupported.  She stands x 2 at EOB to reposition higher in bed with max a x 1.   Pt remains appropriate for SNF upon discharge.  Pt has had overall decline in mobility per husband over the past few months with frequent falls and generally increasing caregiver burden.  She will benefit from SNF to increase overall strength and mobility and to get and appropriate discharge plan in place and support for husband if he chooses to return home vs explore LTC/memory care facilities.  He was providing care at home prior to admission and seems as if he would like to do so after rehab if reasonable but will likely need increased support and equipment to do so.     Recommendations for follow up therapy are one component of a multi-disciplinary discharge planning process, led by the attending physician.  Recommendations may be updated based on patient status, additional functional criteria and insurance authorization.  Follow Up Recommendations  Skilled nursing-short term rehab (<3 hours/day)     Assistance Recommended at Discharge Frequent or constant Supervision/Assistance  Equipment Recommendations  Rolling walker (2 wheels)    Recommendations for Other Services       Precautions / Restrictions Precautions Precautions: Fall Restrictions Weight Bearing Restrictions: No     Mobility  Bed Mobility Overal bed mobility: Needs Assistance Bed Mobility: Supine  to Sit;Sit to Supine   Sidelying to sit: Mod assist;Max assist Supine to sit: Max assist          Transfers Overall transfer level: Needs assistance Equipment used: None Transfers: Sit to/from Stand Sit to Stand: Max assist           General transfer comment: stood x 2 ate bedside to reposition higher in bed.  poor assist from pt.    Ambulation/Gait                   Stairs             Wheelchair Mobility    Modified Rankin (Stroke Patients Only)       Balance Overall balance assessment: Needs assistance Sitting-balance support: Bilateral upper extremity supported Sitting balance-Leahy Scale: Poor     Standing balance support: Bilateral upper extremity supported Standing balance-Leahy Scale: Zero                              Cognition Arousal/Alertness: Awake/alert Behavior During Therapy: WFL for tasks assessed/performed Overall Cognitive Status: History of cognitive impairments - at baseline                                          Exercises      General Comments        Pertinent Vitals/Pain Pain Assessment: No/denies pain    Home Living  Prior Function            PT Goals (current goals can now be found in the care plan section) Progress towards PT goals: Progressing toward goals    Frequency    Min 2X/week      PT Plan Current plan remains appropriate    Co-evaluation              AM-PAC PT "6 Clicks" Mobility   Outcome Measure  Help needed turning from your back to your side while in a flat bed without using bedrails?: A Lot Help needed moving from lying on your back to sitting on the side of a flat bed without using bedrails?: Total Help needed moving to and from a bed to a chair (including a wheelchair)?: Total Help needed standing up from a chair using your arms (e.g., wheelchair or bedside chair)?: Total Help needed to walk in hospital  room?: Total Help needed climbing 3-5 steps with a railing? : Total 6 Click Score: 7    End of Session Equipment Utilized During Treatment: Gait belt Activity Tolerance: Patient tolerated treatment well Patient left: in bed;with call bell/phone within reach;with bed alarm set;with family/visitor present Nurse Communication: Mobility status PT Visit Diagnosis: Muscle weakness (generalized) (M62.81);Difficulty in walking, not elsewhere classified (R26.2);Repeated falls (R29.6);Adult, failure to thrive (R62.7)     Time: 7412-8786 PT Time Calculation (min) (ACUTE ONLY): 18 min  Charges:  $Therapeutic Activity: 8-22 mins                    Danielle Dess, PTA 06/17/21, 11:05 AM

## 2021-06-17 NOTE — Discharge Summary (Signed)
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    Discharge Condition: Improved, being discharged to skilled nursing  Diet recommendation: Dysphagia 3 diet with thin liquids, with Ensure shakes 3 times a day between meals  Vitals:   06/17/21 1200 06/17/21 1519  BP: 139/76 130/68  Pulse: 99 94  Resp: 16 16  Temp: 98.2 F (36.8 C) 98.9 F (37.2 C)  SpO2: 94% 95%    History of present illness:  80 year old female with past medical history of Alzheimer's dementia, B12 deficiency and depression presented to the emergency room on 11/16 after an unwitnessed fall from home.  Patient had a similar fall 2 months prior and has reportedly been quite unsteady.  Work-up revealed UTI.  Patient admitted to the hospitalist service.  Hospital Course:  Principal Problem: Klebsiella UTI (urinary tract infection): Following admission, urine culture grew out Klebsiella.  Patient allergic to multiple antibiotics so given fosfomycin x1 dose followed by Macrobid for 4  days.  Patient completed Macrobid on 11/21 morning.   Active Problems: Unwitnessed fall/deconditioning/ambulatory dysfunction: Progression of overall weakness.  All x-rays unremarkable.  PT and OT recommending skilled nursing.  Vitamin deficiencies worked up and also unremarkable.     Anxiety and depression: Continue SSRI, as needed Xanax     OP (osteoporosis)     B12 deficiency: Continue B12 supplementation    Restless legs syndrome    Chronic systolic CHF (congestive heart failure) (HCC): Appears euvolemic.  BNP on admission at 29.5.     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): Continue Abilify   Severe protein calorie malnutrition: Nutrition Status: Nutrition Problem: Severe Malnutrition Etiology: chronic illness (dementia) Signs/Symptoms: moderate fat depletion, severe fat depletion, moderate muscle depletion, severe muscle depletion Interventions: MVI, Ensure 3 times a day between meals  Procedures: None  Consultations: None  Discharge Exam: BP 130/68 (BP Location: Left Arm)   Pulse 94   Temp 98.9 F (37.2 C)   Resp 16   Ht 5\' 2"  (1.575 m)   Wt 53.5 kg   SpO2 95%   BMI 21.58 kg/m   General: Oriented x1, no acute distress Cardiovascular: Regular rate and rhythm, S1-S2, 2 out of 6 systolic ejection murmur Respiratory: Clear to auscultation bilaterally  Discharge Instructions You were cared for by a hospitalist during your hospital stay. If you have any questions about your discharge medications or the care you received while you were in the hospital after you are discharged, you can call the unit and asked to speak with the hospitalist on call if the hospitalist that took care of you is not available. Once you are  discharged, your primary care physician will handle any further medical issues. Please note that NO REFILLS for any discharge medications will be authorized once you are discharged, as it is  imperative that you return to your primary care physician (or establish a relationship with a primary care physician if you do not have one) for your aftercare needs so that they can reassess your need for medications and monitor your lab values.  Discharge Instructions     DIET DYS 3   Complete by: As directed    Also, Ensure shakes 3 times a day between meals   Fluid consistency: Thin   Increase activity slowly   Complete by: As directed       Allergies as of 06/17/2021       Reactions   Penicillins Swelling, Rash   Aspirin Other (See Comments)   Stomach ulcers, bleeding  GI bleed   Ferrous Gluconate Nausea Only, Other (See Comments)   Cannot take in IV form   Iron Polysaccharide Rash   Nsaids Rash, Other (See Comments)   Severe GI upset and prior ulcer   Other Rash, Other (See Comments)   Venofen- unknown   Sulfamethoxazole-trimethoprim Rash   Tolmetin Rash   Severe GI upset and prior ulcer   Venofer [iron Sucrose] Rash        Medication List     STOP taking these medications    atorvastatin 20 MG tablet Commonly known as: LIPITOR       TAKE these medications    acetaminophen 325 MG tablet Commonly known as: TYLENOL Take 2 tablets (650 mg total) by mouth every 6 (six) hours as needed for mild pain (or Fever >/= 101).   ALPRAZolam 0.25 MG tablet Commonly known as: XANAX Take 1 tablet (0.25 mg total) by mouth every 6 (six) hours as needed.   ARIPiprazole 10 MG tablet Commonly known as: ABILIFY Take 10 mg by mouth daily.   DULoxetine 30 MG capsule Commonly known as: CYMBALTA Take 30 mg by mouth daily.   Melatonin 10 MG Caps Take 10 mg by mouth at bedtime as needed.   multivitamin tablet Take 1 tablet by mouth daily.   pantoprazole 40 MG tablet Commonly known as: PROTONIX Take 40 mg by mouth daily.   SUMAtriptan 100 MG tablet Commonly known as: IMITREX 100 mg every 2 (two) hours as needed.   traMADol 50 MG tablet Commonly known as:  ULTRAM Take 75 mg by mouth at bedtime.   traZODone 50 MG tablet Commonly known as: DESYREL Take 150 mg by mouth at bedtime.       Allergies  Allergen Reactions   Penicillins Swelling and Rash   Aspirin Other (See Comments)    Stomach ulcers, bleeding  GI bleed   Ferrous Gluconate Nausea Only and Other (See Comments)    Cannot take in IV form   Iron Polysaccharide Rash   Nsaids Rash and Other (See Comments)    Severe GI upset and prior ulcer   Other Rash and Other (See Comments)    Venofen- unknown   Sulfamethoxazole-Trimethoprim Rash   Tolmetin Rash    Severe GI upset and prior ulcer   Venofer [Iron Sucrose] Rash      The results of significant diagnostics from this hospitalization (including imaging, microbiology, ancillary and laboratory) are listed below for reference.    Significant Diagnostic Studies: DG Abd 1 View  Result Date: 06/15/2021 CLINICAL DATA:  Abdominal pain and distension, urinary tract infection EXAM: ABDOMEN - 1  VIEW COMPARISON:  01/12/2016 FINDINGS: Supine frontal view of the abdomen and pelvis demonstrates revision of enteric stent overlying left upper quadrant. Numerous surgical clips throughout the abdomen. No bowel obstruction or ileus. Bilateral calcifications overlie the renal silhouettes. On the left, a single 4 mm calculus is suspected. On the right, there are at least 5 distinct calcifications, largest conglomerate overlying the upper pole of the right renal silhouette measuring up to 6 mm. Stable vertebral augmentation at the lumbosacral junction. Stable postsurgical changes of the bilateral hips. IMPRESSION: 1. Suspected bilateral renal calculi as above. 2. Revision of enteric stent left upper quadrant. 3. Unremarkable bowel gas pattern. Electronically Signed   By: Sharlet Salina M.D.   On: 06/15/2021 17:32   CT HEAD WO CONTRAST ( )  Result Date: 06/13/2021 CLINICAL DATA:  Unwitnessed fall. EXAM: CT HEAD WITHOUT CONTRAST TECHNIQUE:  Contiguous axial images were obtained from the base of the skull through the vertex without intravenous contrast. COMPARISON:  Head CT 09/08/2019 FINDINGS: Brain: Stable degree of atrophy and chronic small vessel ischemia. No intracranial hemorrhage, mass effect, or midline shift. No hydrocephalus. The basilar cisterns are patent. No evidence of territorial infarct or acute ischemia. No extra-axial or intracranial fluid collection. Vascular: Atherosclerosis of skullbase vasculature without hyperdense vessel or abnormal calcification. Skull: No fracture or focal lesion.  Heterogeneous marrow. Sinuses/Orbits: No acute findings. No mastoid effusion. Bilateral cataract resection. Other: No confluent scalp contusion. IMPRESSION: 1. No acute intracranial abnormality. No skull fracture. 2. Stable atrophy and chronic small vessel ischemia. Electronically Signed   By: Narda Rutherford M.D.   On: 06/13/2021 16:26   CT Cervical Spine Wo Contrast  Result Date: 06/13/2021 CLINICAL DATA:  Unwitnessed fall at home. Neck trauma (Age >= 65y) EXAM: CT CERVICAL SPINE WITHOUT CONTRAST TECHNIQUE: Multidetector CT imaging of the cervical spine was performed without intravenous contrast. Multiplanar CT image reconstructions were also generated. COMPARISON:  CT cervical spine 09/08/2019 FINDINGS: Alignment: No traumatic subluxation. There is stable grade 1 anterolisthesis of C3 on C4 and trace anterolisthesis of C7 on T1. Skull base and vertebrae: No acute fracture. Vertebral body heights are maintained. The dens and skull base are intact. Stable bone island within left C2 and right C4 lamina. Degenerative pannus at C1-C2. Soft tissues and spinal canal: No prevertebral fluid or swelling. No visible canal hematoma. Disc levels: Diffuse degenerative disc disease and facet hypertrophy. Degenerative changes are stable from 2021 exam. Upper chest: Biapical pleuroparenchymal scarring. No acute findings. Other: None. IMPRESSION: 1. No acute  fracture or subluxation of the cervical spine. 2. Multilevel degenerative disc disease and facet hypertrophy throughout the cervical spine, unchanged from 2021 exam. Electronically Signed   By: Narda Rutherford M.D.   On: 06/13/2021 16:30   DG Chest Portable 1 View  Result Date: 06/13/2021 CLINICAL DATA:  Fall, weakness EXAM: PORTABLE CHEST 1 VIEW COMPARISON:  09/08/2019 FINDINGS: Patient is rotated. Heart size is upper limits of normal, unchanged. Chronically coarsened interstitial markings bilaterally. No focal airspace consolidation. No pleural effusion or pneumothorax. Bones are demineralized. No acute bony findings are evident. IMPRESSION: No active disease. Electronically Signed   By: Duanne Guess D.O.   On: 06/13/2021 09:35   DG Shoulder Right Portable  Result Date: 06/13/2021 CLINICAL DATA:  Fall, right shoulder pain EXAM: PORTABLE RIGHT SHOULDER COMPARISON:  None. FINDINGS: There is no acute fracture or dislocation. Glenohumeral and acromioclavicular alignment is normal. There is mild degenerative change about the [facility] joint. The bones are diffusely demineralized. IMPRESSION: No acute fracture  or dislocation. Electronically Signed   By: Lesia Hausen M.D.   On: 06/13/2021 09:36   DG Hand Complete Right  Result Date: 06/13/2021 CLINICAL DATA:  Status post fall, pain EXAM: RIGHT HAND - COMPLETE 3+ VIEW COMPARISON:  None. FINDINGS: No acute fracture or dislocation. No aggressive osseous lesion. Normal alignment. Generalized osteopenia. 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 Mild osteoarthritis of the first MCP joint. Soft tissue are unremarkable. No radiopaque foreign body or soft tissue emphysema. IMPRESSION: No acute osseous injury of the right wrist. Electronically Signed   By: Elige Ko M.D.   On: 06/13/2021 09:35    Microbiology: Recent Results (from the past 240 hour(s))  Urine Culture     Status: Abnormal   Collection Time: 06/13/21  8:50 AM   Specimen: Urine,  Random  Result Value Ref Range Status   Specimen Description   Final    URINE, RANDOM Performed at [facility], [address], [place], [place] [address]    Special Requests   Final    NONE Performed at [facility], [address] [place], [place] [address]    Culture >=100,000 COLONIES/mL KLEBSIELLA PNEUMONIAE (A)  Final   Report Status 06/15/2021 FINAL  Final   Organism ID, Bacteria KLEBSIELLA PNEUMONIAE (A)  Final      Susceptibility   Klebsiella pneumoniae - MIC*    AMPICILLIN RESISTANT Resistant     CEFAZOLIN <=4 SENSITIVE Sensitive     CEFEPIME <=0.12 SENSITIVE Sensitive     CEFTRIAXONE <=0.25 SENSITIVE Sensitive     CIPROFLOXACIN <=0.25 SENSITIVE Sensitive     GENTAMICIN <=1 SENSITIVE Sensitive     IMIPENEM <=0.25 SENSITIVE Sensitive     NITROFURANTOIN <=16 SENSITIVE Sensitive     TRIMETH/SULFA <=20 SENSITIVE Sensitive     AMPICILLIN/SULBACTAM 4 SENSITIVE Sensitive     PIP/TAZO <=4 SENSITIVE Sensitive     * >=100,000 COLONIES/mL KLEBSIELLA PNEUMONIAE  Resp Panel by RT-PCR (Flu A&B, Covid) Nasopharyngeal Swab     Status: None   Collection Time: 06/13/21  6:34 PM   Specimen: Nasopharyngeal Swab; Nasopharyngeal(NP) swabs in vial transport medium  Result Value Ref Range Status   SARS Coronavirus 2 by RT PCR NEGATIVE NEGATIVE Final    Comment: (NOTE) SARS-CoV-2 target nucleic acids are NOT DETECTED.  The SARS-CoV-2 RNA is generally detectable in upper respiratory specimens during the acute phase of infection. The lowest concentration of SARS-CoV-2 viral copies this assay can detect is 138 copies/mL. A negative result does not preclude SARS-Cov-2 infection and should not be used as the sole basis for treatment or other patient management decisions. A negative result may occur with  improper specimen collection/handling, submission of specimen other than nasopharyngeal swab, presence of viral mutation(s) within the areas targeted by this assay, and  inadequate number of viral copies(<138 copies/mL). A negative result must be combined with clinical observations, patient history, and epidemiological information. The expected result is Negative.  Fact Sheet for Patients:  BloggerCourse.com  Fact Sheet for Healthcare Providers:  SeriousBroker.it  This test is no t yet approved or cleared by the [place] FDA and  has been authorized for detection and/or diagnosis of SARS-CoV-2 by FDA under an Emergency Use Authorization (EUA). This EUA will remain  in effect (meaning this test can be used) for the duration of the COVID-19 declaration under Section 564(b)(1) of the Act, 21 U.S.C.section 360bbb-3(b)(1), unless the authorization is terminated  or revoked sooner.  Influenza A by PCR NEGATIVE NEGATIVE Final   Influenza B by PCR NEGATIVE NEGATIVE Final    Comment: (NOTE) The Xpert Xpress SARS-CoV-2/FLU/RSV plus assay is intended as an aid in the diagnosis of influenza from Nasopharyngeal swab specimens and should not be used as a sole basis for treatment. Nasal washings and aspirates are unacceptable for Xpert Xpress SARS-CoV-2/FLU/RSV testing.  Fact Sheet for Patients: BloggerCourse.com  Fact Sheet for Healthcare Providers: SeriousBroker.it  This test is not yet approved or cleared by the [place] FDA and has been authorized for detection and/or diagnosis of SARS-CoV-2 by FDA under an Emergency Use Authorization (EUA). This EUA will remain in effect (meaning this test can be used) for the duration of the COVID-19 declaration under Section 564(b)(1) of the Act, 21 U.S.C. section 360bbb-3(b)(1), unless the authorization is terminated or revoked.  Performed at [facility], [address] [place], [place] [address]      Labs: Basic Metabolic Panel: Recent Labs  Lab 06/13/21 [phone] 06/14/21 0439  06/15/21 0516 06/16/21 0442 06/17/21 0316  NA 139 139 136 140 137  K 3.6 3.5 3.7 3.6 3.1*  CL 103 104 106 105 103  CO2 GLUCOSE 105* 109* 133* 111* 126*  BUN CREATININE 0.76 0.56 0.65 0.63 0.47  CALCIUM 9.5 8.4* 8.4* 8.7* 8.6*  MG  --  2.0 2.1 2.0 2.1  PHOS  --  4.0  --   --   --    Liver Function Tests: Recent Labs  Lab 06/14/21 0439  AST 19  ALT 18  ALKPHOS 85  BILITOT 0.3  PROT 6.0*  ALBUMIN 3.2*   No results for input(s): LIPASE, AMYLASE in the last 168 hours. No results for input(s): AMMONIA in the last 168 hours. CBC: Recent Labs  Lab 06/13/21 0858 06/14/21 0439 06/14/21 0852 06/15/21 0516 06/16/21 0442 06/17/21 0316  WBC 10.9* 15.6*  --  12.4* 15.0* 11.3*  NEUTROABS 8.8*  --   --   --   --   --   HGB 15.1* 13.6  --  12.6 13.4 13.4  HCT 46.5* 42.9 42.2 40.3 44.1 42.7  MCV 94.1 94.3  --  93.9 96.9 92.8  PLT 328 280  --  231 146* 163   Cardiac Enzymes: Recent Labs  Lab 06/13/21 1234  CKTOTAL 124   BNP: BNP (last 3 results) Recent Labs    06/13/21 0858  BNP 29.5    ProBNP (last 3 results) No results for input(s): PROBNP in the last 8760 hours.  CBG: Recent Labs  Lab 06/16/21 1659 06/16/21 2359 06/17/21 0400 06/17/21 0819 06/17/21 1158  GLUCAP 111* 127* 132* 107* 111*       Signed:  Hollice Espy, MD [facility] 06/17/2021, 3:49 PM

## 2021-06-18 LAB — RESP PANEL BY RT-PCR (FLU A&B, COVID) ARPGX2
Influenza A by PCR: NEGATIVE
Influenza B by PCR: NEGATIVE
SARS Coronavirus 2 by RT PCR: NEGATIVE

## 2021-06-18 LAB — URINALYSIS, ROUTINE W REFLEX MICROSCOPIC
Bilirubin Urine: NEGATIVE
Glucose, UA: NEGATIVE mg/dL
Hgb urine dipstick: NEGATIVE
Ketones, ur: NEGATIVE mg/dL
Leukocytes,Ua: NEGATIVE
Nitrite: NEGATIVE
Protein, ur: 100 mg/dL — AB
Specific Gravity, Urine: 1.035 — ABNORMAL HIGH (ref 1.005–1.030)
pH: 5 (ref 5.0–8.0)

## 2021-06-18 LAB — CBC
HCT: 42.6 % (ref 36.0–46.0)
Hemoglobin: 13.2 g/dL (ref 12.0–15.0)
MCH: 28.7 pg (ref 26.0–34.0)
MCHC: 31 g/dL (ref 30.0–36.0)
MCV: 92.6 fL (ref 80.0–100.0)
Platelets: 184 10*3/uL (ref 150–400)
RBC: 4.6 MIL/uL (ref 3.87–5.11)
RDW: 13 % (ref 11.5–15.5)
WBC: 8.1 10*3/uL (ref 4.0–10.5)
nRBC: 0 % (ref 0.0–0.2)

## 2021-06-18 LAB — BASIC METABOLIC PANEL
Anion gap: 9 (ref 5–15)
BUN: 22 mg/dL (ref 8–23)
CO2: 28 mmol/L (ref 22–32)
Calcium: 8.8 mg/dL — ABNORMAL LOW (ref 8.9–10.3)
Chloride: 102 mmol/L (ref 98–111)
Creatinine, Ser: 0.65 mg/dL (ref 0.44–1.00)
GFR, Estimated: >60 mL/min (ref >60)
Glucose, Bld: 156 mg/dL — ABNORMAL HIGH (ref 70–99)
Potassium: 3.2 mmol/L — ABNORMAL LOW (ref 3.5–5.1)
Sodium: 139 mmol/L (ref 135–145)

## 2021-06-18 LAB — MAGNESIUM: Magnesium: 2 mg/dL (ref 1.7–2.4)

## 2021-06-18 LAB — GLUCOSE, CAPILLARY
Glucose-Capillary: 127 mg/dL — ABNORMAL HIGH (ref 70–99)
Glucose-Capillary: 117 mg/dL — ABNORMAL HIGH (ref 70–99)
Glucose-Capillary: 145 mg/dL — ABNORMAL HIGH (ref 70–99)

## 2021-06-18 MED ORDER — POTASSIUM CHLORIDE CRYS ER 20 MEQ PO TBCR
40.0000 meq | EXTENDED_RELEASE_TABLET | Freq: Once | ORAL | Status: AC
  Administered 2021-06-18: 40 meq via ORAL
  Filled 2021-06-18: qty 2

## 2021-06-18 NOTE — TOC Progression Note (Addendum)
Transition of Care [facility] - Progression Note    Patient Details  Name: Ashley Young MRN: [id number] Date of Birth: 1941/02/23  Transition of Care [facility]) CM/SW [place], LCSW Phone Number: 06/18/2021, 8:31 AM  Clinical Narrative:  Insurance authorization still pending. PASARR obtained: [id number].    10:28 am: SNF admissions coordinator requested that we go ahead and order COVID test. Sent secure chat to MD.  1:02 pm: Insurance authorization still pending.  3:00 pm: Met with husband and answered questions.  Expected Discharge Plan: [place] Barriers to Discharge: Continued Medical Work up  Expected Discharge Plan and Services Expected Discharge Plan: [place] In-house Referral: Clinical Social Work Discharge Planning Services: CM Consult Post Acute Care Choice: [place] Living arrangements for the past 2 months: Single Family Home                 DME Arranged: N/A DME Agency: NA       HH Arranged: NA           Social Determinants of Health (SDOH) Interventions    Readmission Risk Interventions No flowsheet data found.

## 2021-06-18 NOTE — Care Management Important Message (Signed)
Important Message  Patient Details  Name: NIEMA CARRARA MRN: [id number] Date of Birth: 1941-01-05   Medicare Important Message Given:  Yes     Olegario Messier A Elizabella Nolet 06/18/2021, 2:35 PM

## 2021-06-18 NOTE — Progress Notes (Signed)
Occupational Therapy Treatment Patient Details Name: Ashley Young MRN: [id number] DOB: 1940-08-06 Today's Date: 06/18/2021   History of present illness 80 y.o. female with below medical history including Alzheimer's dementia, failure to thrive, and poor mobility presents to the ED after unwitnessed fall. UA c/w UTI.   OT comments  Pt seen for OT tx this date to f/u re: safety with ADLs/ADL mobility. Pt requires MOD A to participate in UB g/h tasks with moderate multi-modal cueing. She is noted to be slightly more alert and participatory this date versus last session and makes a meaningful effort to contribute to washing face and applying lotion. She requires MAX A to come to EOB sitting. She is still noted to be resistive ad fearful, but demonstrates improved balance in sitting, with 3, 30 second bouts of sustaining static sitting w/o external support from OT. She otherwise requires MIN A to sustain and is still noted to be leaning to R occasionally. She tolerates ~10 mins sitting activity total. Pt returned to bed with MAX A and requires MAX A +2 to propel towards HOB to optimize positioning. Pt left with all needs met and in reach. Her spouse is present throughout session. Will continue to follow.    Recommendations for follow up therapy are one component of a multi-disciplinary discharge planning process, led by the attending physician.  Recommendations may be updated based on patient status, additional functional criteria and insurance authorization.    Follow Up Recommendations  Skilled nursing-short term rehab (<3 hours/day)    Assistance Recommended at Discharge Frequent or constant Supervision/Assistance  Equipment Recommendations  BSC/3in1    Recommendations for Other Services      Precautions / Restrictions Precautions Precautions: Fall Restrictions Weight Bearing Restrictions: No       Mobility Bed Mobility Overal bed mobility: Needs Assistance Bed Mobility: Supine  to Sit;Sit to Supine     Supine to sit: Max assist Sit to supine: Max assist   General bed mobility comments: increased time to advance LEs towards EOB, requires MAX gentle encouragement to come to sitting as she is fearful of falling    Transfers                         Balance Overall balance assessment: Needs assistance Sitting-balance support: Bilateral upper extremity supported Sitting balance-Leahy Scale: Poor Sitting balance - Comments: still with intermittent R lean, more participatory in sustaining static sitting today, for longer bouts of ~30 secs before requring assist.       Standing balance comment: deferred                           ADL either performed or assessed with clinical judgement   ADL Overall ADL's : Needs assistance/impaired     Grooming: Wash/dry face;Moderate assistance;Bed level Grooming Details (indicate cue type and reason): high fowler's in bed, elbow support to elevate L UE, pt is more participatory with less cueing, but still requires hand over hand to initiate                                    Extremity/Trunk Assessment              Vision       Perception     Praxis      Cognition Arousal/Alertness: Awake/alert Behavior During Therapy: [facility] for tasks  assessed/performed Overall Cognitive Status: History of cognitive impairments - at baseline                                 General Comments: some improved interaction and wakefullness today. She is generally more participatory. Able to follow ~40% simple one step commands with increased time and multimodal cues          Exercises     Shoulder Instructions       General Comments      Pertinent Vitals/ Pain       Pain Assessment: Faces Faces Pain Scale: Hurts a little bit Pain Location: does grimmace with mobilization, but just generally, unable to specify location/quantify. Pain Descriptors / Indicators:  Grimacing Pain Intervention(s): Limited activity within patient's tolerance;Monitored during session;Repositioned  Home Living                                          Prior Functioning/Environment              Frequency  Min 2X/week        Progress Toward Goals  OT Goals(current goals can now be found in the care plan section)  Progress towards OT goals: Progressing toward goals  Acute Rehab OT Goals Patient Stated Goal: for pt to get stonger OT Goal Formulation: With family Time For Goal Achievement: 06/28/21 Potential to Achieve Goals: [place] Discharge plan remains appropriate    Co-evaluation                 AM-PAC OT "6 Clicks" Daily Activity     Outcome Measure   Help from another person eating meals?: A Lot Help from another person taking care of personal grooming?: A Lot Help from another person toileting, which includes using toliet, bedpan, or urinal?: Total Help from another person bathing (including washing, rinsing, drying)?: Total Help from another person to put on and taking off regular upper body clothing?: A Lot Help from another person to put on and taking off regular lower body clothing?: Total 6 Click Score: 9    End of Session    OT Visit Diagnosis: Other abnormalities of gait and mobility (R26.89);Muscle weakness (generalized) (M62.81);Other symptoms and signs involving cognitive function;Adult, failure to thrive (R62.7)   Activity Tolerance Patient tolerated treatment well;Other (comment)   Patient Left in bed;with call bell/phone within reach;with bed alarm set;with family/visitor present   Nurse Communication Mobility status        Time: 8016-5537 OT Time Calculation (min): 21 min  Charges: OT General Charges $OT Visit: 1 Visit OT Treatments $Self Care/Home Management : 8-22 mins  Gerrianne Scale, [place], OTR/L ascom [phone] 06/18/21, 1:52 PM

## 2021-06-18 NOTE — Progress Notes (Signed)
PT Cancellation Note  Patient Details Name: Ashley Young MRN: [id number] DOB: 1940-11-15   Cancelled Treatment:    Reason Eval/Treat Not Completed: Other (comment)  Attempted this pm  Pt asleep and only awakened briefly during discussion with husband then fell quickly back to sleep.  Pt did have OT session this am.  Will return at a later time/date.   Danielle Dess 06/18/2021, 2:22 PM

## 2021-06-18 NOTE — Discharge Summary (Signed)
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    Discharge Condition: Improved, being discharged to skilled nursing  Diet recommendation: Dysphagia 3 diet with thin liquids, with Ensure shakes 3 times a day between meals  Vitals:   06/18/21 0725 06/18/21 1148  BP: 117/68 119/69  Pulse: 91 93  Resp: 15 16  Temp: 98.2 F (36.8 C) 98.2 F (36.8 C)  SpO2: 92% 96%    History of present illness:  80 year old female with past medical history of Alzheimer's dementia, B12 deficiency and depression presented to the emergency room on 11/16 after an unwitnessed fall from home.  Patient had a similar fall 2 months prior and has reportedly been quite unsteady.  Work-up revealed UTI.  Patient admitted to the hospitalist service.  Hospital Course:  Principal Problem: Klebsiella UTI (urinary tract infection): Following admission, urine culture grew out Klebsiella.  Patient allergic to multiple antibiotics so given fosfomycin x1 dose followed by  Macrobid for 4 days.  Patient completed Macrobid on 11/21 morning.   Active Problems: Unwitnessed fall/deconditioning/ambulatory dysfunction: Progression of overall weakness.  All x-rays unremarkable.  PT and OT recommending skilled nursing.  Vitamin deficiencies worked up and also unremarkable.     Anxiety and depression: Continue SSRI, as needed Xanax     OP (osteoporosis)     B12 deficiency: Continue B12 supplementation    Restless legs syndrome    Chronic systolic CHF (congestive heart failure) (HCC): Appears euvolemic.  BNP on admission at 29.5.     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): Continue Abilify   Severe protein calorie malnutrition: Nutrition Status: Nutrition Problem: Severe Malnutrition Etiology: chronic illness (dementia) Signs/Symptoms: moderate fat depletion, severe fat depletion, moderate muscle depletion, severe muscle depletion Interventions: MVI, Ensure 3 times a day between meals  Procedures: None  Consultations: None  Discharge Exam: BP 119/69 (BP Location: Left Arm)   Pulse 93   Temp 98.2 F (36.8 C)   Resp 16   Ht 5\' 2"  (1.575 m)   Wt 53.5 kg   SpO2 96%   BMI 21.58 kg/m   General: Oriented x1, no acute distress Cardiovascular: Regular rate and rhythm, S1-S2, 2 out of 6 systolic ejection murmur Respiratory: Clear to auscultation bilaterally  Discharge Instructions You were cared for by a hospitalist during your hospital stay. If you have any questions about your discharge medications or the care you received while you were in the hospital after you are discharged, you can call the unit and asked to speak with the hospitalist on call if the hospitalist that took care of you is not available. Once you are  discharged, your primary care physician will handle any further medical issues. Please note that NO REFILLS for any discharge medications will be authorized once you are  discharged, as it is imperative that you return to your primary care physician (or establish a relationship with a primary care physician if you do not have one) for your aftercare needs so that they can reassess your need for medications and monitor your lab values.  Discharge Instructions     DIET DYS 3   Complete by: As directed    Also, Ensure shakes 3 times a day between meals   Fluid consistency: Thin   Increase activity slowly   Complete by: As directed       Allergies as of 06/18/2021       Reactions   Penicillins Swelling, Rash   Aspirin Other (See Comments)   Stomach ulcers, bleeding  GI bleed   Ferrous Gluconate Nausea Only, Other (See Comments)   Cannot take in IV form   Iron Polysaccharide Rash   Nsaids Rash, Other (See Comments)   Severe GI upset and prior ulcer   Other Rash, Other (See Comments)   Venofen- unknown   Sulfamethoxazole-trimethoprim Rash   Tolmetin Rash   Severe GI upset and prior ulcer   Venofer [iron Sucrose] Rash        Medication List     STOP taking these medications    atorvastatin 20 MG tablet Commonly known as: LIPITOR       TAKE these medications    acetaminophen 325 MG tablet Commonly known as: TYLENOL Take 2 tablets (650 mg total) by mouth every 6 (six) hours as needed for mild pain (or Fever >/= 101).   ALPRAZolam 0.25 MG tablet Commonly known as: XANAX Take 1 tablet (0.25 mg total) by mouth every 6 (six) hours as needed.   ARIPiprazole 10 MG tablet Commonly known as: ABILIFY Take 10 mg by mouth daily.   DULoxetine 30 MG capsule Commonly known as: CYMBALTA Take 30 mg by mouth daily.   Melatonin 10 MG Caps Take 10 mg by mouth at bedtime as needed.   multivitamin tablet Take 1 tablet by mouth daily.   pantoprazole 40 MG tablet Commonly known as: PROTONIX Take 40 mg by mouth daily.   SUMAtriptan 100 MG tablet Commonly known as: IMITREX 100 mg every 2 (two) hours as needed.   traMADol 50 MG  tablet Commonly known as: ULTRAM Take 75 mg by mouth at bedtime.   traZODone 50 MG tablet Commonly known as: DESYREL Take 150 mg by mouth at bedtime.       Allergies  Allergen Reactions   Penicillins Swelling and Rash   Aspirin Other (See Comments)    Stomach ulcers, bleeding  GI bleed   Ferrous Gluconate Nausea Only and Other (See Comments)    Cannot take in IV form   Iron Polysaccharide Rash   Nsaids Rash and Other (See Comments)    Severe GI upset and prior ulcer   Other Rash and Other (See Comments)    Venofen- unknown   Sulfamethoxazole-Trimethoprim Rash   Tolmetin Rash    Severe GI upset and prior ulcer   Venofer [Iron Sucrose] Rash      The results of significant diagnostics from this hospitalization (including imaging, microbiology, ancillary and laboratory) are listed below for reference.    Significant Diagnostic Studies: DG Abd 1 View  Result Date: 06/15/2021 CLINICAL DATA:  Abdominal pain and distension, urinary tract infection EXAM: ABDOMEN - 1  VIEW COMPARISON:  01/12/2016 FINDINGS: Supine frontal view of the abdomen and pelvis demonstrates revision of enteric stent overlying left upper quadrant. Numerous surgical clips throughout the abdomen. No bowel obstruction or ileus. Bilateral calcifications overlie the renal silhouettes. On the left, a single 4 mm calculus is suspected. On the right, there are at least 5 distinct calcifications, largest conglomerate overlying the upper pole of the right renal silhouette measuring up to 6 mm. Stable vertebral augmentation at the lumbosacral junction. Stable postsurgical changes of the bilateral hips. IMPRESSION: 1. Suspected bilateral renal calculi as above. 2. Revision of enteric stent left upper quadrant. 3. Unremarkable bowel gas pattern. Electronically Signed   By: Sharlet Salina M.D.   On: 06/15/2021 17:32   CT HEAD WO CONTRAST ( )  Result Date: 06/13/2021 CLINICAL DATA:  Unwitnessed fall. EXAM: CT HEAD WITHOUT  CONTRAST TECHNIQUE: Contiguous axial images were obtained from the base of the skull through the vertex without intravenous contrast. COMPARISON:  Head CT 09/08/2019 FINDINGS: Brain: Stable degree of atrophy and chronic small vessel ischemia. No intracranial hemorrhage, mass effect, or midline shift. No hydrocephalus. The basilar cisterns are patent. No evidence of territorial infarct or acute ischemia. No extra-axial or intracranial fluid collection. Vascular: Atherosclerosis of skullbase vasculature without hyperdense vessel or abnormal calcification. Skull: No fracture or focal lesion.  Heterogeneous marrow. Sinuses/Orbits: No acute findings. No mastoid effusion. Bilateral cataract resection. Other: No confluent scalp contusion. IMPRESSION: 1. No acute intracranial abnormality. No skull fracture. 2. Stable atrophy and chronic small vessel ischemia. Electronically Signed   By: Narda Rutherford M.D.   On: 06/13/2021 16:26   CT Cervical Spine Wo Contrast  Result Date: 06/13/2021 CLINICAL DATA:  Unwitnessed fall at home. Neck trauma (Age >= 65y) EXAM: CT CERVICAL SPINE WITHOUT CONTRAST TECHNIQUE: Multidetector CT imaging of the cervical spine was performed without intravenous contrast. Multiplanar CT image reconstructions were also generated. COMPARISON:  CT cervical spine 09/08/2019 FINDINGS: Alignment: No traumatic subluxation. There is stable grade 1 anterolisthesis of C3 on C4 and trace anterolisthesis of C7 on T1. Skull base and vertebrae: No acute fracture. Vertebral body heights are maintained. The dens and skull base are intact. Stable bone island within left C2 and right C4 lamina. Degenerative pannus at C1-C2. Soft tissues and spinal canal: No prevertebral fluid or swelling. No visible canal hematoma. Disc levels: Diffuse degenerative disc disease and facet hypertrophy. Degenerative changes are stable from 2021 exam. Upper chest: Biapical pleuroparenchymal scarring. No acute findings. Other: None.  IMPRESSION: 1. No acute fracture or subluxation of the cervical spine. 2. Multilevel degenerative disc disease and facet hypertrophy throughout the cervical spine, unchanged from 2021 exam. Electronically Signed   By: Narda Rutherford M.D.   On: 06/13/2021 16:30   DG Chest Portable 1 View  Result Date: 06/13/2021 CLINICAL DATA:  Fall, weakness EXAM: PORTABLE CHEST 1 VIEW COMPARISON:  09/08/2019 FINDINGS: Patient is rotated. Heart size is upper limits of normal, unchanged. Chronically coarsened interstitial markings bilaterally. No focal airspace consolidation. No pleural effusion or pneumothorax. Bones are demineralized. No acute bony findings are evident. IMPRESSION: No active disease. Electronically Signed   By: Duanne Guess D.O.   On: 06/13/2021 09:35   DG Shoulder Right Portable  Result Date: 06/13/2021 CLINICAL DATA:  Fall, right shoulder pain EXAM: PORTABLE RIGHT SHOULDER COMPARISON:  None. FINDINGS: There is no acute fracture or dislocation. Glenohumeral and acromioclavicular alignment is normal. There is mild degenerative change about the [facility] joint. The bones are diffusely demineralized. IMPRESSION: No acute fracture  or dislocation. Electronically Signed   By: Lesia Hausen M.D.   On: 06/13/2021 09:36   DG Hand Complete Right  Result Date: 06/13/2021 CLINICAL DATA:  Status post fall, pain EXAM: RIGHT HAND - COMPLETE 3+ VIEW COMPARISON:  None. FINDINGS: No acute fracture or dislocation. No aggressive osseous lesion. Normal alignment. Generalized osteopenia. 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 Mild osteoarthritis of the first MCP joint. Soft tissue are unremarkable. No radiopaque foreign body or soft tissue emphysema. IMPRESSION: No acute osseous injury of the right wrist. Electronically Signed   By: Elige Ko M.D.   On: 06/13/2021 09:35    Microbiology: Recent Results (from the past 240 hour(s))  Urine Culture     Status: Abnormal   Collection Time: 06/13/21  8:50  AM   Specimen: Urine, Random  Result Value Ref Range Status   Specimen Description   Final    URINE, RANDOM Performed at [facility], [address] [place], [place] [address]    Special Requests   Final    NONE Performed at [facility], [address] [place], [place] [address]    Culture >=100,000 COLONIES/mL KLEBSIELLA PNEUMONIAE (A)  Final   Report Status 06/15/2021 FINAL  Final   Organism ID, Bacteria KLEBSIELLA PNEUMONIAE (A)  Final      Susceptibility   Klebsiella pneumoniae - MIC*    AMPICILLIN RESISTANT Resistant     CEFAZOLIN <=4 SENSITIVE Sensitive     CEFEPIME <=0.12 SENSITIVE Sensitive     CEFTRIAXONE <=0.25 SENSITIVE Sensitive     CIPROFLOXACIN <=0.25 SENSITIVE Sensitive     GENTAMICIN <=1 SENSITIVE Sensitive     IMIPENEM <=0.25 SENSITIVE Sensitive     NITROFURANTOIN <=16 SENSITIVE Sensitive     TRIMETH/SULFA <=20 SENSITIVE Sensitive     AMPICILLIN/SULBACTAM 4 SENSITIVE Sensitive     PIP/TAZO <=4 SENSITIVE Sensitive     * >=100,000 COLONIES/mL KLEBSIELLA PNEUMONIAE  Resp Panel by RT-PCR (Flu A&B, Covid) Nasopharyngeal Swab     Status: None   Collection Time: 06/13/21  6:34 PM   Specimen: Nasopharyngeal Swab; Nasopharyngeal(NP) swabs in vial transport medium  Result Value Ref Range Status   SARS Coronavirus 2 by RT PCR NEGATIVE NEGATIVE Final    Comment: (NOTE) SARS-CoV-2 target nucleic acids are NOT DETECTED.  The SARS-CoV-2 RNA is generally detectable in upper respiratory specimens during the acute phase of infection. The lowest concentration of SARS-CoV-2 viral copies this assay can detect is 138 copies/mL. A negative result does not preclude SARS-Cov-2 infection and should not be used as the sole basis for treatment or other patient management decisions. A negative result may occur with  improper specimen collection/handling, submission of specimen other than nasopharyngeal swab, presence of viral mutation(s) within the areas  targeted by this assay, and inadequate number of viral copies(<138 copies/mL). A negative result must be combined with clinical observations, patient history, and epidemiological information. The expected result is Negative.  Fact Sheet for Patients:  BloggerCourse.com  Fact Sheet for Healthcare Providers:  SeriousBroker.it  This test is no t yet approved or cleared by the [place] FDA and  has been authorized for detection and/or diagnosis of SARS-CoV-2 by FDA under an Emergency Use Authorization (EUA). This EUA will remain  in effect (meaning this test can be used) for the duration of the COVID-19 declaration under Section 564(b)(1) of the Act, 21 U.S.C.section 360bbb-3(b)(1), unless the authorization is terminated  or revoked sooner.  Influenza A by PCR NEGATIVE NEGATIVE Final   Influenza B by PCR NEGATIVE NEGATIVE Final    Comment: (NOTE) The Xpert Xpress SARS-CoV-2/FLU/RSV plus assay is intended as an aid in the diagnosis of influenza from Nasopharyngeal swab specimens and should not be used as a sole basis for treatment. Nasal washings and aspirates are unacceptable for Xpert Xpress SARS-CoV-2/FLU/RSV testing.  Fact Sheet for Patients: BloggerCourse.com  Fact Sheet for Healthcare Providers: SeriousBroker.it  This test is not yet approved or cleared by the [place] FDA and has been authorized for detection and/or diagnosis of SARS-CoV-2 by FDA under an Emergency Use Authorization (EUA). This EUA will remain in effect (meaning this test can be used) for the duration of the COVID-19 declaration under Section 564(b)(1) of the Act, 21 U.S.C. section 360bbb-3(b)(1), unless the authorization is terminated or revoked.  Performed at [facility], [address] [place], [place] [address]      Labs: Basic Metabolic Panel: Recent Labs  Lab  06/14/21 [phone] 06/15/21 0516 06/16/21 0442 06/17/21 0316 06/18/21 0506  NA 139 136 140 137 139  K 3.5 3.7 3.6 3.1* 3.2*  CL 104 106 105 103 102  CO2 24 24 26 27 28   GLUCOSE 109* 133* 111* 126* 156*  BUN 18 17 19 20 22   CREATININE 0.56 0.65 0.63 0.47 0.65  CALCIUM 8.4* 8.4* 8.7* 8.6* 8.8*  MG 2.0 2.1 2.0 2.1 2.0  PHOS 4.0  --   --   --   --     Liver Function Tests: Recent Labs  Lab 06/14/21 0439  AST 19  ALT 18  ALKPHOS 85  BILITOT 0.3  PROT 6.0*  ALBUMIN 3.2*    No results for input(s): LIPASE, AMYLASE in the last 168 hours. No results for input(s): AMMONIA in the last 168 hours. CBC: Recent Labs  Lab 06/13/21 0858 06/14/21 0439 06/14/21 0852 06/15/21 0516 06/16/21 0442 06/17/21 0316 06/18/21 0506  WBC 10.9* 15.6*  --  12.4* 15.0* 11.3* 8.1  NEUTROABS 8.8*  --   --   --   --   --   --   HGB 15.1* 13.6  --  12.6 13.4 13.4 13.2  HCT 46.5* 42.9 42.2 40.3 44.1 42.7 42.6  MCV 94.1 94.3  --  93.9 96.9 92.8 92.6  PLT 328 280  --  231 146* 163 184    Cardiac Enzymes: Recent Labs  Lab 06/13/21 1234  CKTOTAL 124    BNP: BNP (last 3 results) Recent Labs    06/13/21 0858  BNP 29.5     ProBNP (last 3 results) No results for input(s): PROBNP in the last 8760 hours.  CBG: Recent Labs  Lab 06/17/21 1158 06/17/21 2031 06/18/21 0628 06/18/21 0722 06/18/21 1144  GLUCAP 111* 100* 127* 117* 145*        Signed:  Hollice Espy, MD [facility] 06/18/2021, 2:39 PM

## 2021-06-19 DIAGNOSIS — E43 Unspecified severe protein-calorie malnutrition: Secondary | ICD-10-CM | POA: Diagnosis not present

## 2021-06-19 DIAGNOSIS — N3000 Acute cystitis without hematuria: Secondary | ICD-10-CM | POA: Diagnosis not present

## 2021-06-19 DIAGNOSIS — F0390 Unspecified dementia without behavioral disturbance: Secondary | ICD-10-CM | POA: Diagnosis not present

## 2021-06-19 DIAGNOSIS — I5022 Chronic systolic (congestive) heart failure: Secondary | ICD-10-CM | POA: Diagnosis not present

## 2021-06-19 DIAGNOSIS — L8990 Pressure ulcer of unspecified site, unspecified stage: Secondary | ICD-10-CM | POA: Insufficient documentation

## 2021-06-19 LAB — GLUCOSE, CAPILLARY: Glucose-Capillary: 105 mg/dL — ABNORMAL HIGH (ref 70–99)

## 2021-06-19 LAB — CBC
HCT: 39.3 % (ref 36.0–46.0)
Hemoglobin: 12.7 g/dL (ref 12.0–15.0)
MCH: 29.7 pg (ref 26.0–34.0)
MCHC: 32.3 g/dL (ref 30.0–36.0)
MCV: 91.8 fL (ref 80.0–100.0)
Platelets: 196 10*3/uL (ref 150–400)
RBC: 4.28 MIL/uL (ref 3.87–5.11)
RDW: 13 % (ref 11.5–15.5)
WBC: 8.1 10*3/uL (ref 4.0–10.5)
nRBC: 0 % (ref 0.0–0.2)

## 2021-06-19 LAB — MAGNESIUM: Magnesium: 1.7 mg/dL (ref 1.7–2.4)

## 2021-06-19 LAB — BASIC METABOLIC PANEL
Anion gap: 8 (ref 5–15)
BUN: 18 mg/dL (ref 8–23)
CO2: 27 mmol/L (ref 22–32)
Calcium: 8.8 mg/dL — ABNORMAL LOW (ref 8.9–10.3)
Chloride: 102 mmol/L (ref 98–111)
Creatinine, Ser: 0.54 mg/dL (ref 0.44–1.00)
GFR, Estimated: >60 mL/min (ref >60)
Glucose, Bld: 105 mg/dL — ABNORMAL HIGH (ref 70–99)
Potassium: 3.5 mmol/L (ref 3.5–5.1)
Sodium: 137 mmol/L (ref 135–145)

## 2021-06-19 MED ORDER — TRAMADOL HCL 50 MG PO TABS: 75.0000 mg | ORAL_TABLET | Freq: Every day | ORAL | 0 refills | Status: DC

## 2021-06-19 MED ORDER — TRAZODONE HCL 50 MG PO TABS: 150.0000 mg | ORAL_TABLET | Freq: Every day | ORAL | 0 refills | Status: DC

## 2021-06-19 NOTE — TOC Transition Note (Signed)
Transition of Care [facility]) - CM/SW Discharge Note   Patient Details  Name: Ashley Young MRN: [id number] Date of Birth: 1941/05/15  Transition of [facility]) CM/SW Contact:  Margarito Liner, LCSW Phone Number: 06/19/2021, 11:57 AM   Clinical Narrative:  Patient has orders to discharge to [facility] today. RN will call report to [phone] (Room 505). EMS transport arranged for 1:00. No further concerns. CSW signing off.   Final next level of care: Skilled Nursing Facility Barriers to Discharge: Barriers Resolved   Patient Goals and CMS Choice Patient states their goals for this hospitalization and ongoing recovery are:: Patient's husband would like to try to short term rehab CMS Medicare.gov Compare Post Acute Care list provided to:: Patient Represenative (must comment) Choice offered to / list presented to : Spouse  Discharge Placement PASRR number recieved: 06/18/21            Patient chooses bed at: [facility] Patient to be transferred to facility by: EMS Name of family member notified: Berdina Cheever Patient and family notified of of transfer: 06/19/21  Discharge Plan and Services In-house Referral: Clinical Social Work Discharge Planning Services: [employer] Consult Post Acute Care Choice: Skilled Nursing Facility          DME Arranged: N/A DME Agency: NA       HH Arranged: NA          Social Determinants of Health (SDOH) Interventions     Readmission Risk Interventions No flowsheet data found.

## 2021-06-19 NOTE — Discharge Summary (Addendum)
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    Discharge Condition: Improved, being discharged to skilled nursing  Diet recommendation: Dysphagia 3 diet with thin liquids, with Ensure shakes 3 times a day between meals   Vitals:   06/19/21 0520 06/19/21 0735  BP: 107/61 116/77  Pulse: 83 81  Resp: 16 16  Temp: 97.8 F (36.6 C) 97.6 F (36.4 C)  SpO2: 94% 95%    History of present illness:  80 year old female with past medical history of Alzheimer's dementia, B12 deficiency and depression presented to the emergency room on 11/16 after an unwitnessed fall from home.  Patient had a similar fall 2 months prior and has reportedly been quite unsteady.  Work-up revealed UTI.  Patient admitted to the hospitalist service.  Hospital Course:  Principal Problem: Klebsiella UTI (urinary tract infection): Following admission, urine culture grew out Klebsiella.  Patient allergic to multiple antibiotics so  given fosfomycin x1 dose followed by Macrobid for 4 days.  Patient completed Macrobid on 11/21 morning.   Active Problems: Unwitnessed fall/deconditioning/ambulatory dysfunction: Progression of overall weakness.  All x-rays unremarkable.  PT and OT recommending skilled nursing.  Vitamin deficiencies worked up and also unremarkable.     Anxiety and depression: Continue SSRI, as needed Xanax  Pressure Injury 06/18/21 Coccyx Medial Stage 1 -  Intact skin with non-blanchable redness of a localized area usually over a bony prominence. (Active)  06/18/21 2115  Location: Coccyx  Location Orientation: Medial  Staging: Stage 1 -  Intact skin with non-blanchable redness of a localized area usually over a bony prominence.  Wound Description (Comments):   Present on Admission: Yes       OP (osteoporosis)     B12 deficiency: Continue B12 supplementation    Restless legs syndrome    Chronic systolic CHF (congestive heart failure) (HCC): Appears euvolemic.  BNP on admission at 29.5.     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): Continue Abilify   Severe protein calorie malnutrition: Nutrition Status: Nutrition Problem: Severe Malnutrition Etiology: chronic illness (dementia) Signs/Symptoms: moderate fat depletion, severe fat depletion, moderate muscle depletion, severe muscle depletion Interventions: MVI, Ensure 3 times a day between meals  Procedures: None  Consultations: None  Discharge Exam: BP 116/77 (BP Location: Left Arm)   Pulse 81   Temp 97.6 F (36.4 C) (Oral)   Resp 16   Ht 5\' 2"  (1.575 m)   Wt 53.5 kg   SpO2 95%   BMI 21.58 kg/m   General: Oriented x1, no acute distress Cardiovascular: Regular rate and rhythm, S1-S2, 2 out  of 6 systolic ejection murmur Respiratory: Clear to auscultation bilaterally  Discharge Instructions You were cared for by a hospitalist during your hospital stay. If you have any  questions about your discharge medications or the care you received while you were in the hospital after you are discharged, you can call the unit and asked to speak with the hospitalist on call if the hospitalist that took care of you is not available. Once you are discharged, your primary care physician will handle any further medical issues. Please note that NO REFILLS for any discharge medications will be authorized once you are discharged, as it is imperative that you return to your primary care physician (or establish a relationship with a primary care physician if you do not have one) for your aftercare needs so that they can reassess your need for medications and monitor your lab values.  Discharge Instructions     DIET DYS 3   Complete by: As directed    Also, Ensure shakes 3 times a day between meals   Fluid consistency: Thin   Increase activity slowly   Complete by: As directed       Allergies as of 06/19/2021       Reactions   Penicillins Swelling, Rash   Aspirin Other (See Comments)   Stomach ulcers, bleeding  GI bleed   Ferrous Gluconate Nausea Only, Other (See Comments)   Cannot take in IV form   Iron Polysaccharide Rash   Nsaids Rash, Other (See Comments)   Severe GI upset and prior ulcer   Other Rash, Other (See Comments)   Venofen- unknown   Sulfamethoxazole-trimethoprim Rash   Tolmetin Rash   Severe GI upset and prior ulcer   Venofer [iron Sucrose] Rash        Medication List     STOP taking these medications    atorvastatin 20 MG tablet Commonly known as: LIPITOR       TAKE these medications    acetaminophen 325 MG tablet Commonly known as: TYLENOL Take 2 tablets (650 mg total) by mouth every 6 (six) hours as needed for mild pain (or Fever >/= 101).   ALPRAZolam 0.25 MG tablet Commonly known as: XANAX Take 1 tablet (0.25 mg total) by mouth every 6 (six) hours as needed.   ARIPiprazole 10 MG tablet Commonly known as: ABILIFY Take 10 mg by  mouth daily.   DULoxetine 30 MG capsule Commonly known as: CYMBALTA Take 30 mg by mouth daily.   Melatonin 10 MG Caps Take 10 mg by mouth at bedtime as needed.   multivitamin tablet Take 1 tablet by mouth daily.   pantoprazole 40 MG tablet Commonly known as: PROTONIX Take 40 mg by mouth daily.   SUMAtriptan 100 MG tablet Commonly known as: IMITREX 100 mg every 2 (two) hours as needed.   traMADol 50 MG tablet Commonly known as: ULTRAM Take 75 mg by mouth at bedtime.   traZODone 50 MG tablet Commonly known as: DESYREL Take 150 mg by mouth at bedtime.       Allergies  Allergen Reactions   Penicillins Swelling and Rash   Aspirin Other (See Comments)    Stomach ulcers, bleeding  GI bleed   Ferrous Gluconate Nausea Only and Other (See Comments)    Cannot take in IV form   Iron Polysaccharide Rash   Nsaids Rash and Other (See Comments)    Severe GI upset and prior ulcer   Other Rash and Other (See Comments)    Venofen-  unknown   Sulfamethoxazole-Trimethoprim Rash   Tolmetin Rash    Severe GI upset and prior ulcer   Venofer [Iron Sucrose] Rash      The results of significant diagnostics from this hospitalization (including imaging, microbiology, ancillary and laboratory) are listed below for reference.    Significant Diagnostic Studies: DG Abd 1 View  Result Date: 06/15/2021 CLINICAL DATA:  Abdominal pain and distension, urinary tract infection EXAM: ABDOMEN - 1 VIEW COMPARISON:  01/12/2016 FINDINGS: Supine frontal view of the abdomen and pelvis demonstrates revision of enteric stent overlying left upper quadrant. Numerous surgical clips throughout the abdomen. No bowel obstruction or ileus. Bilateral calcifications overlie the renal silhouettes. On the left, a single 4 mm calculus is suspected. On the right, there are at least 5 distinct calcifications, largest conglomerate overlying the upper pole of the right renal silhouette measuring up to 6 mm. Stable vertebral  augmentation at the lumbosacral junction. Stable postsurgical changes of the bilateral hips. IMPRESSION: 1. Suspected bilateral renal calculi as above. 2. Revision of enteric stent left upper quadrant. 3. Unremarkable bowel gas pattern. Electronically Signed   By: Sharlet Salina M.D.   On: 06/15/2021 17:32   CT HEAD WO CONTRAST ( )  Result Date: 06/13/2021 CLINICAL DATA:  Unwitnessed fall. EXAM: CT HEAD WITHOUT CONTRAST TECHNIQUE: Contiguous axial images were obtained from the base of the skull through the vertex without intravenous contrast. COMPARISON:  Head CT 09/08/2019 FINDINGS: Brain: Stable degree of atrophy and chronic small vessel ischemia. No intracranial hemorrhage, mass effect, or midline shift. No hydrocephalus. The basilar cisterns are patent. No evidence of territorial infarct or acute ischemia. No extra-axial or intracranial fluid collection. Vascular: Atherosclerosis of skullbase vasculature without hyperdense vessel or abnormal calcification. Skull: No fracture or focal lesion.  Heterogeneous marrow. Sinuses/Orbits: No acute findings. No mastoid effusion. Bilateral cataract resection. Other: No confluent scalp contusion. IMPRESSION: 1. No acute intracranial abnormality. No skull fracture. 2. Stable atrophy and chronic small vessel ischemia. Electronically Signed   By: Narda Rutherford M.D.   On: 06/13/2021 16:26   CT Cervical Spine Wo Contrast  Result Date: 06/13/2021 CLINICAL DATA:  Unwitnessed fall at home. Neck trauma (Age >= 65y) EXAM: CT CERVICAL SPINE WITHOUT CONTRAST TECHNIQUE: Multidetector CT imaging of the cervical spine was performed without intravenous contrast. Multiplanar CT image reconstructions were also generated. COMPARISON:  CT cervical spine 09/08/2019 FINDINGS: Alignment: No traumatic subluxation. There is stable grade 1 anterolisthesis of C3 on C4 and trace anterolisthesis of C7 on T1. Skull base and vertebrae: No acute fracture. Vertebral body heights are  maintained. The dens and skull base are intact. Stable bone island within left C2 and right C4 lamina. Degenerative pannus at C1-C2. Soft tissues and spinal canal: No prevertebral fluid or swelling. No visible canal hematoma. Disc levels: Diffuse degenerative disc disease and facet hypertrophy. Degenerative changes are stable from 2021 exam. Upper chest: Biapical pleuroparenchymal scarring. No acute findings. Other: None. IMPRESSION: 1. No acute fracture or subluxation of the cervical spine. 2. Multilevel degenerative disc disease and facet hypertrophy throughout the cervical spine, unchanged from 2021 exam. Electronically Signed   By: Narda Rutherford M.D.   On: 06/13/2021 16:30   DG Chest Portable 1 View  Result Date: 06/13/2021 CLINICAL DATA:  Fall, weakness EXAM: PORTABLE CHEST 1 VIEW COMPARISON:  09/08/2019 FINDINGS: Patient is rotated. Heart size is upper limits of normal, unchanged. Chronically coarsened interstitial markings bilaterally. No focal airspace consolidation. No pleural effusion or pneumothorax. Bones are demineralized. No acute bony findings  are evident. IMPRESSION: No active disease. Electronically Signed   By: Duanne Guess D.O.   On: 06/13/2021 09:35   DG Shoulder Right Portable  Result Date: 06/13/2021 CLINICAL DATA:  Fall, right shoulder pain EXAM: PORTABLE RIGHT SHOULDER COMPARISON:  None. FINDINGS: There is no acute fracture or dislocation. Glenohumeral and acromioclavicular alignment is normal. There is mild degenerative change about the [facility] joint. The bones are diffusely demineralized. IMPRESSION: No acute fracture or dislocation. Electronically Signed   By: Lesia Hausen M.D.   On: 06/13/2021 09:36   DG Hand Complete Right  Result Date: 06/13/2021 CLINICAL DATA:  Status post fall, pain EXAM: RIGHT HAND - COMPLETE 3+ VIEW COMPARISON:  None. FINDINGS: No acute fracture or dislocation. No aggressive osseous lesion. Normal alignment. Generalized osteopenia. 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 Mild osteoarthritis of the first MCP joint. Soft tissue are unremarkable. No radiopaque foreign body or soft tissue emphysema. IMPRESSION: No acute osseous injury of the right wrist. Electronically Signed   By: Elige Ko M.D.   On: 06/13/2021 09:35    Microbiology: Recent Results (from the past 240 hour(s))  Urine Culture     Status: Abnormal   Collection Time: 06/13/21  8:50 AM   Specimen: Urine, Random  Result Value Ref Range Status   Specimen Description   Final    URINE, RANDOM Performed at [facility], [address] [place], [place] [address]    Special Requests   Final    NONE Performed at [facility], [address] [place], [place] [address]    Culture >=100,000 COLONIES/mL KLEBSIELLA PNEUMONIAE (A)  Final   Report Status 06/15/2021 FINAL  Final   Organism ID, Bacteria KLEBSIELLA PNEUMONIAE (A)  Final      Susceptibility   Klebsiella pneumoniae - MIC*    AMPICILLIN RESISTANT Resistant     CEFAZOLIN <=4 SENSITIVE Sensitive     CEFEPIME <=0.12 SENSITIVE Sensitive     CEFTRIAXONE <=0.25 SENSITIVE Sensitive     CIPROFLOXACIN <=0.25 SENSITIVE Sensitive     GENTAMICIN <=1 SENSITIVE Sensitive     IMIPENEM <=0.25 SENSITIVE Sensitive     NITROFURANTOIN <=16 SENSITIVE Sensitive     TRIMETH/SULFA <=20 SENSITIVE Sensitive     AMPICILLIN/SULBACTAM 4 SENSITIVE Sensitive     PIP/TAZO <=4 SENSITIVE Sensitive     * >=100,000 COLONIES/mL KLEBSIELLA PNEUMONIAE  Resp Panel by RT-PCR (Flu A&B, Covid) Nasopharyngeal Swab     Status: None   Collection Time: 06/13/21  6:34 PM   Specimen: Nasopharyngeal Swab; Nasopharyngeal(NP) swabs in vial transport medium  Result Value Ref Range Status   SARS Coronavirus 2 by RT PCR NEGATIVE NEGATIVE Final    Comment: (NOTE) SARS-CoV-2 target nucleic acids are NOT DETECTED.  The SARS-CoV-2 RNA is generally detectable in upper respiratory specimens during the acute phase of infection. The  lowest concentration of SARS-CoV-2 viral copies this assay can detect is 138 copies/mL. A negative result does not preclude SARS-Cov-2 infection and should not be used as the sole basis for treatment or other patient management decisions. A negative result may occur with  improper specimen collection/handling, submission of specimen other than nasopharyngeal swab, presence of viral mutation(s) within the areas targeted by this assay, and inadequate number of viral copies(<138 copies/mL). A negative result must be combined with clinical observations, patient history, and epidemiological information. The expected result is Negative.  Fact Sheet for Patients:  BloggerCourse.com  Fact Sheet for Healthcare Providers:  SeriousBroker.it  This  test is no t yet approved or cleared by the [place] and  has been authorized for detection and/or diagnosis of SARS-CoV-2 by FDA under an Emergency Use Authorization (EUA). This EUA will remain  in effect (meaning this test can be used) for the duration of the COVID-19 declaration under Section 564(b)(1) of the Act, 21 U.S.C.section 360bbb-3(b)(1), unless the authorization is terminated  or revoked sooner.       Influenza A by PCR NEGATIVE NEGATIVE Final   Influenza B by PCR NEGATIVE NEGATIVE Final    Comment: (NOTE) The Xpert Xpress SARS-CoV-2/FLU/RSV plus assay is intended as an aid in the diagnosis of influenza from Nasopharyngeal swab specimens and should not be used as a sole basis for treatment. Nasal washings and aspirates are unacceptable for Xpert Xpress SARS-CoV-2/FLU/RSV testing.  Fact Sheet for Patients: BloggerCourse.com  Fact Sheet for Healthcare Providers: SeriousBroker.it  This test is not yet approved or cleared by the [place] FDA and has been authorized for detection and/or diagnosis of SARS-CoV-2 by FDA under  an Emergency Use Authorization (EUA). This EUA will remain in effect (meaning this test can be used) for the duration of the COVID-19 declaration under Section 564(b)(1) of the Act, 21 U.S.C. section 360bbb-3(b)(1), unless the authorization is terminated or revoked.  Performed at [facility], [address] [place], [place] [address]   Resp Panel by RT-PCR (Flu A&B, Covid) Nasopharyngeal Swab     Status: None   Collection Time: 06/18/21  1:27 PM   Specimen: Nasopharyngeal Swab; Nasopharyngeal(NP) swabs in vial transport medium  Result Value Ref Range Status   SARS Coronavirus 2 by RT PCR NEGATIVE NEGATIVE Final    Comment: (NOTE) SARS-CoV-2 target nucleic acids are NOT DETECTED.  The SARS-CoV-2 RNA is generally detectable in upper respiratory specimens during the acute phase of infection. The lowest concentration of SARS-CoV-2 viral copies this assay can detect is 138 copies/mL. A negative result does not preclude SARS-Cov-2 infection and should not be used as the sole basis for treatment or other patient management decisions. A negative result may occur with  improper specimen collection/handling, submission of specimen other than nasopharyngeal swab, presence of viral mutation(s) within the areas targeted by this assay, and inadequate number of viral copies(<138 copies/mL). A negative result must be combined with clinical observations, patient history, and epidemiological information. The expected result is Negative.  Fact Sheet for Patients:  BloggerCourse.com  Fact Sheet for Healthcare Providers:  SeriousBroker.it  This test is no t yet approved or cleared by the [place] FDA and  has been authorized for detection and/or diagnosis of SARS-CoV-2 by FDA under an Emergency Use Authorization (EUA). This EUA will remain  in effect (meaning this test can be used) for the duration of the COVID-19 declaration under  Section 564(b)(1) of the Act, 21 U.S.C.section 360bbb-3(b)(1), unless the authorization is terminated  or revoked sooner.       Influenza A by PCR NEGATIVE NEGATIVE Final   Influenza B by PCR NEGATIVE NEGATIVE Final    Comment: (NOTE) The Xpert Xpress SARS-CoV-2/FLU/RSV plus assay is intended as an aid in the diagnosis of influenza from Nasopharyngeal swab specimens and should not be used as a sole basis for treatment. Nasal washings and aspirates are unacceptable for Xpert Xpress SARS-CoV-2/FLU/RSV testing.  Fact Sheet for Patients: BloggerCourse.com  Fact Sheet for Healthcare Providers: SeriousBroker.it  This test is not yet approved or cleared by the [place] FDA and has been authorized for detection and/or diagnosis  of SARS-CoV-2 by FDA under an Emergency Use Authorization (EUA). This EUA will remain in effect (meaning this test can be used) for the duration of the COVID-19 declaration under Section 564(b)(1) of the Act, 21 U.S.C. section 360bbb-3(b)(1), unless the authorization is terminated or revoked.  Performed at [facility], [address] [place], [place] [address]      Labs: Basic Metabolic Panel: Recent Labs  Lab 06/14/21 [phone] 06/15/21 0516 06/16/21 0442 06/17/21 0316 06/18/21 0506 06/19/21 0425  NA 139 136 140 137 139 137  K 3.5 3.7 3.6 3.1* 3.2* 3.5  CL 104 106 105 103 102 102  CO2 24 24 26 27 28 27   GLUCOSE 109* 133* 111* 126* 156* 105*  BUN 18 17 19 20 22 18   CREATININE 0.56 0.65 0.63 0.47 0.65 0.54  CALCIUM 8.4* 8.4* 8.7* 8.6* 8.8* 8.8*  MG 2.0 2.1 2.0 2.1 2.0 1.7  PHOS 4.0  --   --   --   --   --     Liver Function Tests: Recent Labs  Lab 06/14/21 0439  AST 19  ALT 18  ALKPHOS 85  BILITOT 0.3  PROT 6.0*  ALBUMIN 3.2*    No results for input(s): LIPASE, AMYLASE in the last 168 hours. No results for input(s): AMMONIA in the last 168 hours. CBC: Recent Labs  Lab  06/13/21 0858 06/14/21 0439 06/15/21 0516 06/16/21 0442 06/17/21 0316 06/18/21 0506 06/19/21 0425  WBC 10.9*   < > 12.4* 15.0* 11.3* 8.1 8.1  NEUTROABS 8.8*  --   --   --   --   --   --   HGB 15.1*   < > 12.6 13.4 13.4 13.2 12.7  HCT 46.5*   < > 40.3 44.1 42.7 42.6 39.3  MCV 94.1   < > 93.9 96.9 92.8 92.6 91.8  PLT 328   < > 231 146* 163 184 196   < > = values in this interval not displayed.    Cardiac Enzymes: Recent Labs  Lab 06/13/21 1234  CKTOTAL 124    BNP: BNP (last 3 results) Recent Labs    06/13/21 0858  BNP 29.5     ProBNP (last 3 results) No results for input(s): PROBNP in the last 8760 hours.  CBG: Recent Labs  Lab 06/17/21 2031 06/18/21 0628 06/18/21 0722 06/18/21 1144 06/19/21 0522  GLUCAP 100* 127* 117* 145* 105*        Signed:  06/20/21, MD [facility] 06/19/2021, 10:43 AM

## 2021-06-19 NOTE — Progress Notes (Signed)
Nutrition Follow-up  DOCUMENTATION CODES:   Non-severe (moderate) malnutrition in context of chronic illness  INTERVENTION:   -Continue Boost Breeze po TID, each supplement provides 250 kcal and 9 grams of protein  -Continue MVI with minerals daily  NUTRITION DIAGNOSIS:   Severe Malnutrition related to chronic illness (dementia) as evidenced by moderate fat depletion, severe fat depletion, moderate muscle depletion, severe muscle depletion.  Ongoing  GOAL:   Patient will meet greater than or equal to 90% of their needs  Progressing   MONITOR:   PO intake, Supplement acceptance, Diet advancement, Labs, Weight trends, Skin, I & O's  REASON FOR ASSESSMENT:   Consult Assessment of nutrition requirement/status, Diet education  ASSESSMENT:   Ashley Young is a 80 y.o. female with PMH significant of Alzheimer's dementia, depression, GERD, iron deficiency anemia, osteoporosis, vitamin B12 deficiency, presented in the ED s/p unwitnessed fall.  Patient is unable to provide history due to dementia.  History is obtained from husband and ED chart.  Husband reports patient presented in the hospital in September 2022 s/p fall, and was discharged home with home health services.  Patient has not fully recovered after that fall, she has multiple falls during that timeframe.  She fell from bed several times and also fell from wheelchair.  Today morning she fell from bed and remained on the floor for unknown duration.  Husband reports that he was unable to get her out of the floor, EMS was called and patient was placed in the bed.  Patient was brought in the ED.  She is at her baseline mental status.  Husband denies any other symptoms, cough, fever, chills, recent travel, sick contacts.  11/18- s/p BSE- advanced to dysphagia 3 diet with liquids  Reviewed I/O's: +380 ml x 24 hours and +998 ml since admission  UOP: 100 ml x 24 hours  Pt has been advanced to a dysphagia 3 with thin liquids  and tolerating well. Noted meal completions have improved; PO 30-100%.    No new weight readings since admission.   Pt is medically stable; awaiting SNF placement.   Medications reviewed and include colace.    Labs reviewed: CBGS: 100-127 (inpatient orders for glycemic control are none).    Diet Order:   Diet Order             DIET DYS 3           DIET DYS 3 Room service appropriate? Yes with Assist; Fluid consistency: Thin  Diet effective now                   EDUCATION NEEDS:   Education needs have been addressed  Skin:  Skin Assessment: Skin Integrity Issues: Skin Integrity Issues:: Stage I Stage I: coccyx  Last BM:  06/18/21  Height:   Ht Readings from Last 1 Encounters:  06/13/21 5\' 2"  (1.575 m)    Weight:   Wt Readings from Last 1 Encounters:  06/13/21 53.5 kg    Ideal Body Weight:  50 kg  BMI:  Body mass index is 21.58 kg/m.  Estimated Nutritional Needs:   Kcal:  1600-1800  Protein:  80-95 grams  Fluid:  > 1.6 L   06/15/21, RD, LDN, CDCES Registered Dietitian II Certified Diabetes Care and Education Specialist Please refer to [facility] for RD and/or RD on-call/weekend/after hours pager

## 2021-06-19 NOTE — TOC Progression Note (Addendum)
Transition of Care [facility]) - Progression Note    Patient Details  Name: Ashley Young MRN: [id number] Date of Birth: 10-Jun-1941  Transition of Care [facility]) CM/SW Contact  Margarito Liner, LCSW Phone Number: 06/19/2021, 8:23 AM  Clinical Narrative:  Insurance authorization still pending.   10:22 am: Insurance authorization approved: [id number]. Valid 11/22-11/28. SNF admissions coordinator is aware. Sent secure chat to MD to notify.  Expected Discharge Plan: Skilled Nursing Facility Barriers to Discharge: Continued Medical Work up  Expected Discharge Plan and Services Expected Discharge Plan: Skilled Nursing Facility In-house Referral: Clinical Social Work Discharge Planning Services: CM Consult Post Acute Care Choice: Skilled Nursing Facility Living arrangements for the past 2 months: Single Family Home                 DME Arranged: N/A DME Agency: NA       HH Arranged: NA           Social Determinants of Health (SDOH) Interventions    Readmission Risk Interventions No flowsheet data found.

## 2021-08-03 ENCOUNTER — Telehealth: Payer: Self-pay | Admitting: Internal Medicine

## 2021-08-03 NOTE — Telephone Encounter (Signed)
Done

## 2021-08-03 NOTE — Telephone Encounter (Signed)
Husband called to cancel pt's appts. She is in [facility] under hospice.

## 2021-11-14 ENCOUNTER — Ambulatory Visit: Payer: Medicare PPO | Admitting: Internal Medicine

## 2021-11-14 ENCOUNTER — Ambulatory Visit: Payer: Medicare PPO

## 2021-11-14 ENCOUNTER — Other Ambulatory Visit: Payer: Medicare PPO

## 2021-11-26 ENCOUNTER — Emergency Department: Payer: Medicare Other

## 2021-11-26 ENCOUNTER — Inpatient Hospital Stay
Admission: EM | Admit: 2021-11-26 | Discharge: 2021-11-28 | DRG: 689 | Disposition: A | Discharge status: Skilled Nursing Facility | Payer: Medicare Other | Source: Skilled Nursing Facility | Attending: Internal Medicine | Admitting: Internal Medicine

## 2021-11-26 ENCOUNTER — Encounter: Payer: Self-pay | Admitting: Emergency Medicine

## 2021-11-26 DIAGNOSIS — S0181XA Laceration without foreign body of other part of head, initial encounter: Secondary | ICD-10-CM

## 2021-11-26 DIAGNOSIS — F02818 Dementia in other diseases classified elsewhere, unspecified severity, with other behavioral disturbance: Secondary | ICD-10-CM | POA: Diagnosis present

## 2021-11-26 DIAGNOSIS — B961 Klebsiella pneumoniae [K. pneumoniae] as the cause of diseases classified elsewhere: Secondary | ICD-10-CM | POA: Diagnosis present

## 2021-11-26 DIAGNOSIS — I959 Hypotension, unspecified: Secondary | ICD-10-CM | POA: Diagnosis present

## 2021-11-26 DIAGNOSIS — S0101XA Laceration without foreign body of scalp, initial encounter: Secondary | ICD-10-CM | POA: Diagnosis present

## 2021-11-26 DIAGNOSIS — Z886 Allergy status to analgesic agent status: Secondary | ICD-10-CM

## 2021-11-26 DIAGNOSIS — R918 Other nonspecific abnormal finding of lung field: Secondary | ICD-10-CM | POA: Diagnosis present

## 2021-11-26 DIAGNOSIS — Z803 Family history of malignant neoplasm of breast: Secondary | ICD-10-CM

## 2021-11-26 DIAGNOSIS — E43 Unspecified severe protein-calorie malnutrition: Secondary | ICD-10-CM | POA: Diagnosis present

## 2021-11-26 DIAGNOSIS — R1312 Dysphagia, oropharyngeal phase: Secondary | ICD-10-CM | POA: Diagnosis present

## 2021-11-26 DIAGNOSIS — F32A Depression, unspecified: Secondary | ICD-10-CM | POA: Diagnosis present

## 2021-11-26 DIAGNOSIS — Z801 Family history of malignant neoplasm of trachea, bronchus and lung: Secondary | ICD-10-CM

## 2021-11-26 DIAGNOSIS — G8929 Other chronic pain: Secondary | ICD-10-CM | POA: Diagnosis present

## 2021-11-26 DIAGNOSIS — A419 Sepsis, unspecified organism: Secondary | ICD-10-CM | POA: Insufficient documentation

## 2021-11-26 DIAGNOSIS — I272 Pulmonary hypertension, unspecified: Secondary | ICD-10-CM | POA: Diagnosis present

## 2021-11-26 DIAGNOSIS — Z87442 Personal history of urinary calculi: Secondary | ICD-10-CM

## 2021-11-26 DIAGNOSIS — E7800 Pure hypercholesterolemia, unspecified: Secondary | ICD-10-CM | POA: Diagnosis present

## 2021-11-26 DIAGNOSIS — W19XXXA Unspecified fall, initial encounter: Secondary | ICD-10-CM | POA: Diagnosis present

## 2021-11-26 DIAGNOSIS — Z1611 Resistance to penicillins: Secondary | ICD-10-CM | POA: Diagnosis present

## 2021-11-26 DIAGNOSIS — M47812 Spondylosis without myelopathy or radiculopathy, cervical region: Secondary | ICD-10-CM | POA: Diagnosis present

## 2021-11-26 DIAGNOSIS — Z79890 Hormone replacement therapy (postmenopausal): Secondary | ICD-10-CM

## 2021-11-26 DIAGNOSIS — M545 Low back pain, unspecified: Secondary | ICD-10-CM | POA: Diagnosis present

## 2021-11-26 DIAGNOSIS — Z880 Allergy status to penicillin: Secondary | ICD-10-CM

## 2021-11-26 DIAGNOSIS — M810 Age-related osteoporosis without current pathological fracture: Secondary | ICD-10-CM | POA: Diagnosis present

## 2021-11-26 DIAGNOSIS — E46 Unspecified protein-calorie malnutrition: Secondary | ICD-10-CM

## 2021-11-26 DIAGNOSIS — Z681 Body mass index (BMI) 19 or less, adult: Secondary | ICD-10-CM | POA: Diagnosis not present

## 2021-11-26 DIAGNOSIS — Z9841 Cataract extraction status, right eye: Secondary | ICD-10-CM

## 2021-11-26 DIAGNOSIS — B952 Enterococcus as the cause of diseases classified elsewhere: Secondary | ICD-10-CM | POA: Diagnosis present

## 2021-11-26 DIAGNOSIS — Z961 Presence of intraocular lens: Secondary | ICD-10-CM | POA: Diagnosis present

## 2021-11-26 DIAGNOSIS — Z79899 Other long term (current) drug therapy: Secondary | ICD-10-CM

## 2021-11-26 DIAGNOSIS — G9341 Metabolic encephalopathy: Secondary | ICD-10-CM

## 2021-11-26 DIAGNOSIS — I5022 Chronic systolic (congestive) heart failure: Secondary | ICD-10-CM | POA: Diagnosis present

## 2021-11-26 DIAGNOSIS — Z66 Do not resuscitate: Secondary | ICD-10-CM | POA: Diagnosis present

## 2021-11-26 DIAGNOSIS — Z9842 Cataract extraction status, left eye: Secondary | ICD-10-CM

## 2021-11-26 DIAGNOSIS — G309 Alzheimer's disease, unspecified: Secondary | ICD-10-CM | POA: Diagnosis present

## 2021-11-26 DIAGNOSIS — Z87440 Personal history of urinary (tract) infections: Secondary | ICD-10-CM

## 2021-11-26 DIAGNOSIS — N390 Urinary tract infection, site not specified: Secondary | ICD-10-CM | POA: Diagnosis present

## 2021-11-26 DIAGNOSIS — Z8711 Personal history of peptic ulcer disease: Secondary | ICD-10-CM

## 2021-11-26 DIAGNOSIS — N3000 Acute cystitis without hematuria: Principal | ICD-10-CM | POA: Diagnosis present

## 2021-11-26 DIAGNOSIS — Z87891 Personal history of nicotine dependence: Secondary | ICD-10-CM

## 2021-11-26 DIAGNOSIS — K219 Gastro-esophageal reflux disease without esophagitis: Secondary | ICD-10-CM | POA: Diagnosis present

## 2021-11-26 DIAGNOSIS — Z7401 Bed confinement status: Secondary | ICD-10-CM

## 2021-11-26 DIAGNOSIS — Z888 Allergy status to other drugs, medicaments and biological substances status: Secondary | ICD-10-CM

## 2021-11-26 DIAGNOSIS — E876 Hypokalemia: Secondary | ICD-10-CM | POA: Diagnosis present

## 2021-11-26 DIAGNOSIS — Z9049 Acquired absence of other specified parts of digestive tract: Secondary | ICD-10-CM

## 2021-11-26 DIAGNOSIS — F0390 Unspecified dementia without behavioral disturbance: Secondary | ICD-10-CM | POA: Diagnosis present

## 2021-11-26 LAB — CBC WITH DIFFERENTIAL/PLATELET
Abs Immature Granulocytes: 0.03 10*3/uL (ref 0.00–0.07)
Basophils Absolute: 0.1 10*3/uL (ref 0.0–0.1)
Basophils Relative: 1 %
Eosinophils Absolute: 0.1 10*3/uL (ref 0.0–0.5)
Eosinophils Relative: 1 %
HCT: 37 % (ref 36.0–46.0)
Hemoglobin: 11.7 g/dL — ABNORMAL LOW (ref 12.0–15.0)
Immature Granulocytes: 0 %
Lymphocytes Relative: 15 %
Lymphs Abs: 1.5 10*3/uL (ref 0.7–4.0)
MCH: 26.5 pg (ref 26.0–34.0)
MCHC: 31.6 g/dL (ref 30.0–36.0)
MCV: 83.7 fL (ref 80.0–100.0)
Monocytes Absolute: 0.8 10*3/uL (ref 0.1–1.0)
Monocytes Relative: 8 %
Neutro Abs: 7.2 10*3/uL (ref 1.7–7.7)
Neutrophils Relative %: 75 %
Platelets: 340 10*3/uL (ref 150–400)
RBC: 4.42 MIL/uL (ref 3.87–5.11)
RDW: 16.4 % — ABNORMAL HIGH (ref 11.5–15.5)
WBC: 9.7 10*3/uL (ref 4.0–10.5)
nRBC: 0 % (ref 0.0–0.2)

## 2021-11-26 LAB — COMPREHENSIVE METABOLIC PANEL
ALT: 14 U/L (ref 0–44)
AST: 17 U/L (ref 15–41)
Albumin: 2.9 g/dL — ABNORMAL LOW (ref 3.5–5.0)
Alkaline Phosphatase: 96 U/L (ref 38–126)
Anion gap: 13 (ref 5–15)
BUN: 18 mg/dL (ref 8–23)
CO2: 24 mmol/L (ref 22–32)
Calcium: 8.1 mg/dL — ABNORMAL LOW (ref 8.9–10.3)
Chloride: 100 mmol/L (ref 98–111)
Creatinine, Ser: 0.59 mg/dL (ref 0.44–1.00)
GFR, Estimated: >60 mL/min (ref >60)
Glucose, Bld: 109 mg/dL — ABNORMAL HIGH (ref 70–99)
Potassium: 3.3 mmol/L — ABNORMAL LOW (ref 3.5–5.1)
Sodium: 137 mmol/L (ref 135–145)
Total Bilirubin: 0.3 mg/dL (ref 0.3–1.2)
Total Protein: 6.2 g/dL — ABNORMAL LOW (ref 6.5–8.1)

## 2021-11-26 LAB — URINALYSIS, ROUTINE W REFLEX MICROSCOPIC
Bilirubin Urine: NEGATIVE
Glucose, UA: NEGATIVE mg/dL
Ketones, ur: NEGATIVE mg/dL
Nitrite: POSITIVE — AB
Protein, ur: 30 mg/dL — AB
RBC / HPF: >50 RBC/hpf — ABNORMAL HIGH (ref 0–5)
Specific Gravity, Urine: 1.016 (ref 1.005–1.030)
Squamous Epithelial / HPF: NONE SEEN (ref 0–5)
WBC, UA: >50 WBC/hpf — ABNORMAL HIGH (ref 0–5)
pH: 5 (ref 5.0–8.0)

## 2021-11-26 MED ORDER — SODIUM CHLORIDE 0.9 % IV SOLN
1.0000 g | Freq: Once | INTRAVENOUS | Status: AC
  Administered 2021-11-26: 1 g via INTRAVENOUS
  Filled 2021-11-26: qty 10

## 2021-11-26 MED ORDER — LIDOCAINE HCL (PF) 1 % IJ SOLN
5.0000 mL | Freq: Once | INTRAMUSCULAR | Status: AC
  Administered 2021-11-26: 5 mL
  Filled 2021-11-26: qty 5

## 2021-11-26 MED ORDER — ACETAMINOPHEN 500 MG PO TABS
1000.0000 mg | ORAL_TABLET | Freq: Once | ORAL | Status: AC
  Administered 2021-11-26: 1000 mg via ORAL
  Filled 2021-11-26: qty 2

## 2021-11-26 NOTE — ED Triage Notes (Signed)
Pt arrived via [facility] from [facility] due to unwitnessed fall from "unknown" time prior to the facility's 3rd shift, shift change. Per staff, pt found to have laceration above the left eye, bleeding controlled. No blood thinners reported by staff. Pt at her baseline per facility staff.  ?

## 2021-11-26 NOTE — Assessment & Plan Note (Signed)
History of pansensitive Klebsiella UTI in November 2022 ?IV Rocephin and follow cultures ?

## 2021-11-26 NOTE — ED Notes (Signed)
Husband arrives at bedside ?

## 2021-11-26 NOTE — Assessment & Plan Note (Signed)
Patient with increased confusion from baseline ?Neurologic checks with fall and aspiration precautions ?We will keep n.p.o. tonight ?

## 2021-11-26 NOTE — Assessment & Plan Note (Signed)
Sutured in the ED ?No evidence of intracranial injury ?Wound care.  Expect suture removal in 5 to 7 days ?

## 2021-11-26 NOTE — ED Provider Notes (Signed)
? ?[facility] ?Provider Note ? ? ? Event Date/Time  ? First MD Initiated Contact with Patient 11/26/21 2128   ?  (approximate) ? ? ?History  ? ?Fall ? ? ?HPI ? ?Ashley Young is a 81 y.o. female who presents to the ED for evaluation of Fall ?  ?I reviewed DC summary from November where patient was admitted for acute cystitis.  Alzheimer's dementia patient resides at a SNF. ? ?Patient presents to the ED via EMS from her SNF after she was found down from an unwitnessed fall.  Has a laceration to her left forehead.  She is pleasantly disoriented and has no complaints.  Does not know what happened. ? ?Husband later arrives and provides all history.  He still lives at home and patient resides at a SNF.  He sees her at least twice per day every day.  Reports a past day or 2 she has seemed more confused than her typical demented self.  No other known falls beyond today. ? ?Physical Exam  ? ?Triage Vital Signs: ?ED Triage Vitals  ?Enc Vitals Group  ?   BP 11/26/21 2117 (!) 94/58  ?   Pulse Rate 11/26/21 2113 90  ?   Resp 11/26/21 2113 16  ?   Temp 11/26/21 2113 98.2 ?F (36.8 ?C)  ?   Temp Source 11/26/21 2113 Oral  ?   SpO2 11/26/21 2113 97 %  ?   Weight 11/26/21 2105 100 lb (45.4 kg)  ?   Height 11/26/21 2105 5\' 1"  (1.549 m)  ?   Head Circumference --   ?   Peak Flow --   ?   Pain Score --   ?   Pain Loc --   ?   Pain Edu? --   ?   Excl. in GC? --   ? ? ?Most recent vital signs: ?Vitals:  ? 11/26/21 2130 11/26/21 2337  ?BP: 107/60 (!) 113/54  ?Pulse: 89 80  ?Resp: 14 18  ?Temp:    ?SpO2: 97% 96%  ? ? ?General: Awake, no distress.  Pleasantly disoriented.  No complaints.  2 cm transverse laceration into the subcutaneous tissue to left-sided forehead, couple centimeters above her left brow.  No bony step-offs or signs of EOM entrapment. ?CV:  Good peripheral perfusion.  ?Resp:  Normal effort.  ?Abd:  No distention.  Suprapubic tenderness without peritoneal features.  Abdomen is otherwise  benign. ?MSK:  No deformity noted.  No signs of trauma to the extremities. ?Neuro:  No focal deficits appreciated. Cranial nerves II through XII intact ?5/5 strength and sensation in all 4 extremities ?Other:   ? ? ?ED Results / Procedures / Treatments  ? ?Labs ?(all labs ordered are listed, but only abnormal results are displayed) ?Labs Reviewed  ?URINALYSIS, ROUTINE W REFLEX MICROSCOPIC - Abnormal; Notable for the following components:  ?    Result Value  ? Color, Urine YELLOW (*)   ? APPearance HAZY (*)   ? Hgb urine dipstick LARGE (*)   ? Protein, ur 30 (*)   ? Nitrite POSITIVE (*)   ? Leukocytes,Ua SMALL (*)   ? RBC / HPF >50 (*)   ? WBC, UA >50 (*)   ? Bacteria, UA MANY (*)   ? All other components within normal limits  ?CBC WITH DIFFERENTIAL/PLATELET - Abnormal; Notable for the following components:  ? Hemoglobin 11.7 (*)   ? RDW 16.4 (*)   ? All other components within normal limits  ?  COMPREHENSIVE METABOLIC PANEL - Abnormal; Notable for the following components:  ? Potassium 3.3 (*)   ? Glucose, Bld 109 (*)   ? Calcium 8.1 (*)   ? Total Protein 6.2 (*)   ? Albumin 2.9 (*)   ? All other components within normal limits  ?URINE CULTURE  ? ? ?EKG ?Sinus rhythm, rate of 90 bpm.  Normal axis and intervals.  Tremulous baseline.  No STEMI. ? ?RADIOLOGY ?CT head reviewed by me without evidence of acute intracranial pathology ? ? ?Official radiology report(s): ?CT HEAD WO CONTRAST ( ) ? ?Result Date: 11/26/2021 ?CLINICAL DATA:  Unwitnessed fall. EXAM: CT HEAD WITHOUT CONTRAST CT CERVICAL SPINE WITHOUT CONTRAST TECHNIQUE: Multidetector CT imaging of the head and cervical spine was performed following the standard protocol without intravenous contrast. Multiplanar CT image reconstructions of the cervical spine were also generated. RADIATION DOSE REDUCTION: This exam was performed according to the departmental dose-optimization program which includes automated exposure control, adjustment of the mA and/or kV according  to patient size and/or use of iterative reconstruction technique. COMPARISON:  Head CT and cervical spine CT both 06/13/2021 FINDINGS: CT HEAD FINDINGS Brain: There is moderately advanced cerebral atrophy, small vessel disease and atrophic ventriculomegaly. There is no midline shift. No focal asymmetry is seen concerning for an acute infarct, hemorrhage or mass. There are small stable chronic lacunar infarcts in the thalami and external capsules. Vascular: Calcifications in the carotid siphons. There are no hyperdense central vessels. Skull: There is mild swelling in the left forehead compatible with a scalp contusion. There is motion artifact on the inferior slices. No depressed skull fracture is seen. There is calvarial osteopenia. Heterogeneous marrow. Sinuses/Orbits: No acute finding. Other: Old lens extractions. CT CERVICAL SPINE FINDINGS Alignment: Stable degenerative grade 1 anterolisthesis at C3-4, stable slight anterolisthesis C7-T1. No traumatic or further listhesis. Slight levoscoliosis. Skull base and vertebrae: There is osteopenia without evidence of displaced fractures. Small bone islands are again noted in the left C2 and right C5 lamina. There is a narrowed anterior atlantodental joint with osteophytes and calcified pannus in the posterior joint space. Soft tissues and spinal canal: No prevertebral fluid or swelling. No visible canal hematoma. Disc levels: The discs are diffusely degenerated with small bidirectional osteophytes deforming the ventral thecal sac without causing cord compression. There is multilevel facet joint and uncinate hypertrophy, multilevel foraminal stenosis most significantly at C3-4, C4-5 and C5-6 on the right. Upper chest: No new abnormality as visualized. Biapical pleural-parenchymal scarring is again noted. Other: There are calcifications at both carotid bifurcations. IMPRESSION: 1. Left forehead scalp contusion with no acute intracranial CT findings or depressed skull  fractures. 2. Osteopenia and degenerative changes of the cervical spine with 2 levels with degenerative grade 1 anterolisthesis. No evidence of fractures. 3. heterogeneous calvarial marrow, clinical significance indeterminate but there has been no appreciable progression in this dating back to the oldest available head CT of 06/18/2014. Electronically Signed   By: Almira Bar M.D.   On: 11/26/2021 22:32  ? ?CT Cervical Spine Wo Contrast ? ?Result Date: 11/26/2021 ?CLINICAL DATA:  Unwitnessed fall. EXAM: CT HEAD WITHOUT CONTRAST CT CERVICAL SPINE WITHOUT CONTRAST TECHNIQUE: Multidetector CT imaging of the head and cervical spine was performed following the standard protocol without intravenous contrast. Multiplanar CT image reconstructions of the cervical spine were also generated. RADIATION DOSE REDUCTION: This exam was performed according to the departmental dose-optimization program which includes automated exposure control, adjustment of the mA and/or kV according to patient size and/or use of  iterative reconstruction technique. COMPARISON:  Head CT and cervical spine CT both 06/13/2021 FINDINGS: CT HEAD FINDINGS Brain: There is moderately advanced cerebral atrophy, small vessel disease and atrophic ventriculomegaly. There is no midline shift. No focal asymmetry is seen concerning for an acute infarct, hemorrhage or mass. There are small stable chronic lacunar infarcts in the thalami and external capsules. Vascular: Calcifications in the carotid siphons. There are no hyperdense central vessels. Skull: There is mild swelling in the left forehead compatible with a scalp contusion. There is motion artifact on the inferior slices. No depressed skull fracture is seen. There is calvarial osteopenia. Heterogeneous marrow. Sinuses/Orbits: No acute finding. Other: Old lens extractions. CT CERVICAL SPINE FINDINGS Alignment: Stable degenerative grade 1 anterolisthesis at C3-4, stable slight anterolisthesis C7-T1. No  traumatic or further listhesis. Slight levoscoliosis. Skull base and vertebrae: There is osteopenia without evidence of displaced fractures. Small bone islands are again noted in the left C2 and right C5 lamina. There i

## 2021-11-26 NOTE — ED Notes (Signed)
Dr. Duncan at bedside 

## 2021-11-26 NOTE — ED Notes (Signed)
Patient transported to CT 

## 2021-11-26 NOTE — Assessment & Plan Note (Signed)
Continue Cymbalta, Abilify, trazodone and Xanax ?Delirium precautions ?

## 2021-11-26 NOTE — Assessment & Plan Note (Deleted)
Compensated ?To resume meds pending med rec ?

## 2021-11-27 ENCOUNTER — Encounter: Payer: Self-pay | Admitting: Internal Medicine

## 2021-11-27 ENCOUNTER — Inpatient Hospital Stay: Payer: Medicare Other

## 2021-11-27 ENCOUNTER — Other Ambulatory Visit: Payer: Self-pay

## 2021-11-27 DIAGNOSIS — N390 Urinary tract infection, site not specified: Secondary | ICD-10-CM | POA: Diagnosis not present

## 2021-11-27 DIAGNOSIS — E46 Unspecified protein-calorie malnutrition: Secondary | ICD-10-CM

## 2021-11-27 LAB — VITAMIN B12: Vitamin B-12: 454 pg/mL (ref 180–914)

## 2021-11-27 LAB — MAGNESIUM: Magnesium: 1.9 mg/dL (ref 1.7–2.4)

## 2021-11-27 MED ORDER — MORPHINE SULFATE (CONCENTRATE) 10 MG/0.5ML PO SOLN
5.0000 mg | Freq: Once | ORAL | Status: AC
  Administered 2021-11-27: 5 mg via ORAL
  Filled 2021-11-27: qty 0.5

## 2021-11-27 MED ORDER — SODIUM CHLORIDE 0.9 % IV SOLN
1.0000 g | INTRAVENOUS | Status: DC
  Administered 2021-11-27: 1 g via INTRAVENOUS
  Filled 2021-11-27 (×2): qty 10

## 2021-11-27 MED ORDER — TRAZODONE HCL 50 MG PO TABS
50.0000 mg | ORAL_TABLET | Freq: Once | ORAL | Status: AC
  Administered 2021-11-27: 50 mg via ORAL
  Filled 2021-11-27: qty 1

## 2021-11-27 MED ORDER — ACETAMINOPHEN 325 MG PO TABS
650.0000 mg | ORAL_TABLET | Freq: Four times a day (QID) | ORAL | Status: DC | PRN
  Administered 2021-11-28: 650 mg via ORAL
  Filled 2021-11-27: qty 2

## 2021-11-27 MED ORDER — SODIUM CHLORIDE 0.9 % IV SOLN
1.0000 g | INTRAVENOUS | Status: DC
Start: 2021-11-27 — End: 2021-11-27

## 2021-11-27 MED ORDER — DULOXETINE HCL 30 MG PO CPEP
30.0000 mg | ORAL_CAPSULE | Freq: Every day | ORAL | Status: DC
  Administered 2021-11-28: 30 mg via ORAL
  Filled 2021-11-27 (×2): qty 1

## 2021-11-27 MED ORDER — ACETAMINOPHEN 650 MG RE SUPP: 650.0000 mg | Freq: Four times a day (QID) | RECTAL | Status: DC | PRN

## 2021-11-27 MED ORDER — KCL IN DEXTROSE-NACL 20-5-0.45 MEQ/L-%-% IV SOLN: INTRAVENOUS | Status: DC

## 2021-11-27 MED ORDER — ONDANSETRON HCL 4 MG PO TABS: 4.0000 mg | ORAL_TABLET | Freq: Four times a day (QID) | ORAL | Status: DC | PRN

## 2021-11-27 MED ORDER — ALPRAZOLAM 0.25 MG PO TABS
0.2500 mg | ORAL_TABLET | Freq: Four times a day (QID) | ORAL | Status: DC | PRN
  Administered 2021-11-27 (×2): 0.25 mg via ORAL
  Filled 2021-11-27 (×2): qty 1

## 2021-11-27 MED ORDER — MORPHINE SULFATE 10 MG/5ML PO SOLN: 5.0000 mg | ORAL | Status: DC | PRN

## 2021-11-27 MED ORDER — ONDANSETRON HCL 4 MG/2ML IJ SOLN: 4.0000 mg | Freq: Four times a day (QID) | INTRAMUSCULAR | Status: DC | PRN

## 2021-11-27 MED ORDER — LORAZEPAM 0.5 MG PO TABS
0.5000 mg | ORAL_TABLET | ORAL | Status: DC | PRN
  Administered 2021-11-27 – 2021-11-28 (×4): 0.5 mg via ORAL
  Filled 2021-11-27 (×4): qty 1

## 2021-11-27 MED ORDER — MELATONIN 5 MG PO TABS
10.0000 mg | ORAL_TABLET | Freq: Every evening | ORAL | Status: DC | PRN
  Administered 2021-11-27: 10 mg via ORAL
  Filled 2021-11-27: qty 2

## 2021-11-27 MED ORDER — ENOXAPARIN SODIUM 40 MG/0.4ML IJ SOSY
40.0000 mg | PREFILLED_SYRINGE | INTRAMUSCULAR | Status: DC
  Administered 2021-11-27 – 2021-11-28 (×2): 40 mg via SUBCUTANEOUS
  Filled 2021-11-27 (×2): qty 0.4

## 2021-11-27 MED ORDER — BISACODYL 10 MG RE SUPP: 10.0000 mg | Freq: Every day | RECTAL | Status: DC | PRN

## 2021-11-27 MED ORDER — ARIPIPRAZOLE 10 MG PO TABS: 10.0000 mg | ORAL_TABLET | Freq: Every day | ORAL | Status: DC

## 2021-11-27 MED ORDER — SODIUM CHLORIDE 0.9 % IV SOLN: INTRAVENOUS | Status: DC

## 2021-11-27 MED ORDER — FAMOTIDINE 20 MG PO TABS
20.0000 mg | ORAL_TABLET | Freq: Every day | ORAL | Status: DC
  Administered 2021-11-27: 20 mg via ORAL
  Filled 2021-11-27: qty 1

## 2021-11-27 MED ORDER — ACETAMINOPHEN 325 MG PO TABS: 650.0000 mg | ORAL_TABLET | Freq: Four times a day (QID) | ORAL | Status: DC | PRN

## 2021-11-27 NOTE — Progress Notes (Addendum)
?PROGRESS NOTE ? ? ? ?Ashley Young  IZT:[id number] DOB: Mar 04, 1941 DOA: 11/26/2021 ?PCP: System, Provider Not In  ? ? ? ?Brief Narrative:  ? ?From admission h and p ?Ashley Young is a 81 y.o. female with medical history significant for Dementia and Klebsiella UTI in November 2022 who was brought to the ED from assisted living with increased confusion resulting in a fall in which she sustained a laceration to the left brow.  Husband at bedside gives most of the history.  Patient is unable to contribute to the history due to dementia. ?ED course and data review: Initially hypotensive at 94/58 in the ED with otherwise normal vitals, fluid responsive to 113/54 by admission.  WBC 9700, hemoglobin 11.7.  Creatinine WNL, potassium 3.3.  Urinalysis strongly consistent with UTI.  EKG, personally viewed and interpreted: Sinus rhythm at 90 with nonspecific ST-T wave changes.  CT head and C-spine showing the following ? ? ?Assessment & Plan: ?  ?Principal Problem: ?  Urinary tract infection ?Active Problems: ?  Acute metabolic encephalopathy ?  Forehead laceration secondary to fall ,initial encounter ?  Dementia (HCC) ? ?# Acute metabolic encephalopathy ?Possibly from uti. Underlying advanced dementia and home meds likely contributory. CT head nothing acute. ?- pharmacy consult for med rec, need to determine what she's receiving at home, may need some adjustments ? ?# Acute cystitis ?Advanced dementia so unable to assess symptoms but reportedly change in mental status for a couple of days that is relatively acute. Urinalysis shows bacteria. Klebsiella uti last year that was broadly sensitive ?- cont ceftriaxone ?- f/u cultures ? ?# Scalp laceration ?After fall. Hemostatic. CT head and c spine no acute findings ?- local wound care, monitor ? ?# RLL infiltrate ?On CXR. Clinically no signs of infection so deferring abx. Malignancy is on ddx, but holding on CT given patient's hospice status, will need to discuss w/  husband whether treating a malignancy would be in line w/ current scope of care ? ?# Hypokalemia ?3.3 ?- f/u mg and add kcl to fluids ? ?# Advanced dementia with behavior disturbance ?Bedbound at baseline ?- pharmacy consult for med rec ?- current presentation may be new baseline ?- is followed by hospice as outpatient. Husband's approach for now is "treat the treatable" but no aggressive care ?- SLP swallow eval ? ?# Chronic pain ?- hold home fentanyl patch (and will pull the one she's wearing) given encephalopathy ?- likewise holding home morphine prn, oxy prn, tramadol qhs, trazodone qhs, though would re-start if she perks up ? ?# Diastolic dysfunction ?# pulmonary hypertension ? ?# Malnutrition, severe ? ?DVT prophylaxis: lovenox ?Code Status: dnr ?Family Communication: husband updated @ bedside ? ?Level of care: Med-Surg ?Status is: Inpatient ?Remains inpatient appropriate because: severity of illness ? ? ? ?Consultants:  ?none ? ?Procedures: ?none ? ?Antimicrobials:  ?ceftriaxone  ? ? ?Subjective: ?asleep ? ?Objective: ?Vitals:  ? 11/27/21 0400 11/27/21 0500 11/27/21 0600 11/27/21 0700  ?BP: 97/68 118/79 (!) 101/58 (!) 109/58  ?Pulse: 80 74 71 67  ?Resp: 18 16 16 16   ?Temp:      ?TempSrc:      ?SpO2: 98% 95% 96% 92%  ?Weight:      ?Height:      ? ? ?Intake/Output Summary (Last 24 hours) at 11/27/2021 0817 ?Last data filed at 11/27/2021 0041 ?Gross per 24 hour  ?Intake 100 ml  ?Output --  ?Net 100 ml  ? ?Filed Weights  ? 11/26/21 2105  ?Weight: 45.4 kg  ? ? ?  Examination: ? ?General exam: Appears calm and comfortable, sleeping, malnourished ?Respiratory system: Clear to auscultation save for rales at bases ?Cardiovascular system: S1 & S2 heard, RRR. No JVD, murmurs, rubs, gallops or clicks. No pedal edema. ?Gastrointestinal system: Abdomen is nondistended, soft and nontender. No organomegaly or masses felt. Normal bowel sounds heard. ?Central nervous system: asleep ?Extremities: Symmetric 5 x 5 power. ?Skin:  hemostatic 1.5 cm laceration left forehead ?Psychiatry: asleep ? ? ? ?Data Reviewed: I have personally reviewed following labs and imaging studies ? ?CBC: ?Recent Labs  ?Lab 11/26/21 ?2145  ?WBC 9.7  ?NEUTROABS 7.2  ?HGB 11.7*  ?HCT 37.0  ?MCV 83.7  ?PLT 340  ? ?Basic Metabolic Panel: ?Recent Labs  ?Lab 11/26/21 ?2145  ?NA 137  ?K 3.3*  ?CL 100  ?CO2 24  ?GLUCOSE 109*  ?BUN 18  ?CREATININE 0.59  ?CALCIUM 8.1*  ? ?GFR: ?Estimated Creatinine Clearance: 38.9 mL/min (by C-G formula based on SCr of 0.59 mg/dL). ?Liver Function Tests: ?Recent Labs  ?Lab 11/26/21 ?2145  ?AST 17  ?ALT 14  ?ALKPHOS 96  ?BILITOT 0.3  ?PROT 6.2*  ?ALBUMIN 2.9*  ? ?No results for input(s): LIPASE, AMYLASE in the last 168 hours. ?No results for input(s): AMMONIA in the last 168 hours. ?Coagulation Profile: ?No results for input(s): INR, PROTIME in the last 168 hours. ?Cardiac Enzymes: ?No results for input(s): CKTOTAL, CKMB, CKMBINDEX, TROPONINI in the last 168 hours. ?BNP (last 3 results) ?No results for input(s): PROBNP in the last 8760 hours. ?HbA1C: ?No results for input(s): HGBA1C in the last 72 hours. ?CBG: ?No results for input(s): GLUCAP in the last 168 hours. ?Lipid Profile: ?No results for input(s): CHOL, HDL, LDLCALC, TRIG, CHOLHDL, LDLDIRECT in the last 72 hours. ?Thyroid Function Tests: ?No results for input(s): TSH, T4TOTAL, FREET4, T3FREE, THYROIDAB in the last 72 hours. ?Anemia Panel: ?No results for input(s): VITAMINB12, FOLATE, FERRITIN, TIBC, IRON, RETICCTPCT in the last 72 hours. ?Urine analysis: ?   ?Component Value Date/Time  ? COLORURINE YELLOW (A) 11/26/2021 2145  ? APPEARANCEUR HAZY (A) 11/26/2021 2145  ? APPEARANCEUR Clear 01/10/2015 0843  ? LABSPEC 1.016 11/26/2021 2145  ? LABSPEC 1.018 11/09/2014 1637  ? PHURINE 5.0 11/26/2021 2145  ? GLUCOSEU NEGATIVE 11/26/2021 2145  ? GLUCOSEU Negative 11/09/2014 1637  ? HGBUR LARGE (A) 11/26/2021 2145  ? BILIRUBINUR NEGATIVE 11/26/2021 2145  ? BILIRUBINUR Negative 01/10/2015 0843   ? BILIRUBINUR Negative 11/09/2014 1637  ? KETONESUR NEGATIVE 11/26/2021 2145  ? PROTEINUR 30 (A) 11/26/2021 2145  ? NITRITE POSITIVE (A) 11/26/2021 2145  ? LEUKOCYTESUR SMALL (A) 11/26/2021 2145  ? LEUKOCYTESUR 3+ 11/09/2014 1637  ? ?Sepsis Labs: ?@LABRCNTIP (procalcitonin:4,lacticidven:4) ? ?)No results found for this or any previous visit (from the past 240 hour(s)).  ? ? ? ? ? ?Radiology Studies: ?CT HEAD WO CONTRAST (5MM) ? ?Result Date: 11/26/2021 ?CLINICAL DATA:  Unwitnessed fall. EXAM: CT HEAD WITHOUT CONTRAST CT CERVICAL SPINE WITHOUT CONTRAST TECHNIQUE: Multidetector CT imaging of the head and cervical spine was performed following the standard protocol without intravenous contrast. Multiplanar CT image reconstructions of the cervical spine were also generated. RADIATION DOSE REDUCTION: This exam was performed according to the departmental dose-optimization program which includes automated exposure control, adjustment of the mA and/or kV according to patient size and/or use of iterative reconstruction technique. COMPARISON:  Head CT and cervical spine CT both 06/13/2021 FINDINGS: CT HEAD FINDINGS Brain: There is moderately advanced cerebral atrophy, small vessel disease and atrophic ventriculomegaly. There is no midline shift. No focal asymmetry is  seen concerning for an acute infarct, hemorrhage or mass. There are small stable chronic lacunar infarcts in the thalami and external capsules. Vascular: Calcifications in the carotid siphons. There are no hyperdense central vessels. Skull: There is mild swelling in the left forehead compatible with a scalp contusion. There is motion artifact on the inferior slices. No depressed skull fracture is seen. There is calvarial osteopenia. Heterogeneous marrow. Sinuses/Orbits: No acute finding. Other: Old lens extractions. CT CERVICAL SPINE FINDINGS Alignment: Stable degenerative grade 1 anterolisthesis at C3-4, stable slight anterolisthesis C7-T1. No traumatic or further  listhesis. Slight levoscoliosis. Skull base and vertebrae: There is osteopenia without evidence of displaced fractures. Small bone islands are again noted in the left C2 and right C5 lamina. There is a narrowed

## 2021-11-27 NOTE — Evaluation (Addendum)
Clinical/Bedside Swallow Evaluation ?Patient Details  ?Name: Ashley Young ?MRN: [id number] ?Date of Birth: [birth year] ? ?Today's Date: 11/27/2021 ?Time: SLP Start Time (ACUTE ONLY): 0945 SLP Stop Time (ACUTE ONLY): 1045 ?SLP Time Calculation (min) (ACUTE ONLY): 60 min ? ?Past Medical History:  ?Past Medical History:  ?Diagnosis Date  ? Arthritis   ? B12 deficiency   ? CHF (congestive heart failure) (HCC)   ? Chronic systolic heart failure (HCC)   ? Dementia (HCC)   ? Depression   ? Dysrhythmia   ? TACHYCARDIA  ? Failure to thrive (0-17)   ? GERD (gastroesophageal reflux disease)   ? Glaucoma   ? Headache   ? MIGRAINES (now approx 1x/month)  ? History of kidney stones   ? Hypercholesterolemia   ? IDA (iron deficiency anemia)   ? Migraine   ? Mood disorder (HCC)   ? Multiple gastric ulcers   ? Osteoporosis   ? Renal disorder   ? Multiple episodes of kidney stones.   ? Wears dentures   ? full upper and lower  ? ?Past Surgical History:  ?Past Surgical History:  ?Procedure Laterality Date  ? ABDOMINAL SURGERY    ? Reconstructed stomach and 3 different surgeries.   ? APPENDECTOMY    ? BREAST SURGERY    ? cyst removal  ? CATARACT EXTRACTION W/PHACO Right 06/05/2015  ? Procedure: CATARACT EXTRACTION PHACO AND INTRAOCULAR LENS PLACEMENT (IOC);  Surgeon: Lockie Mola, MD;  Location: [facility] OR[facility];  Service: Ophthalmology;  Laterality: Right;  [place] 01:06 ?  ? CATARACT EXTRACTION W/PHACO Left 04/15/2018  ? Procedure: CATARACT EXTRACTION PHACO AND INTRAOCULAR LENS PLACEMENT (IOC) LEFT;  Surgeon: Lockie Mola, MD;  Location[facility];  Service: Ophthalmology;  Laterality: Left;  ? CESAREAN SECTION    ? x 3  ? CHOLECYSTECTOMY    ? COLONOSCOPY    ? ESOPHAGOGASTRODUODENOSCOPY    ? INTRAMEDULLARY (IM) NAIL INTERTROCHANTERIC Left 12/29/2016  ? Procedure: INTRAMEDULLARY (IM) NAIL INTERTROCHANTRIC;  Surgeon: Kennedy Bucker, MD;  Location: [facility] OR[facility];  Service: Orthopedics;  Laterality: Left;  ? KYPHOPLASTY    ?  KYPHOPLASTY    ? LITHOTRIPSY    ? and stone extraction  ? ?HPI:  ?Pt is a 81 y.o. female with medical history including Dementia, GERD, falls, osteoporosis, and Klebsiella UTI in November 2022 who was brought to the ED from assisted living with increased confusion resulting in a fall in which she sustained a laceration to the left brow.  Husband at bedside gives most of the history.  Patient is unable to contribute to the history due to dementia.  ED course and data review: Initially hypotensive at 94/58 in the ED with otherwise normal vitals, fluid responsive to 113/54 by admission.  WBC 9700, hemoglobin 11.7.  Creatinine WNL, potassium 3.3.  Urinalysis strongly consistent with UTI.   CXR: Right lower lobe airspace opacity is noted concerning for possible  pneumonia. Followup PA and lateral chest X-ray is recommended in 3-4  weeks following trial of antibiotic therapy to ensure resolution and  exclude underlying malignancy.    ?She is a long-term resident from [facility] and receives Hospice services there.  Husband stated she is on "thickened liquids" at the facility and receives feeding support now.  ?  ?Assessment / Plan / Recommendation  ?Clinical Impression ? Pt seen for clinical swallowing evaluation. She awakened and engaged in po tasks given verbal/visual cues and encouragement/support. She verbally responded to basic, direct questions w/ 1-3 word responses. MOD+ cues  required for follow through w/ tasks. ? ?Pt consumed  trials of ice chips, Nectar liquids (straw), purees and softened solids w/ no overt, clinical s/s of aspiration noted; no pharyngeal phase deficits noted w/ trials given. Post swallows, no decline in vocal quality, no coughing, no decline in pulmonary status; O2 sats remained 97%. Pt is on RA.  ?During the oral phase, fairly timely bolus management, A-P transfer, and swallow/clearing occurred during trials of Nectar liquids and purees. W/ trials of softened/minced solids, pt  exhibited Min+ increased oral phase time for bolus mangement; min+ oral dysphagia. Informal OM Exam appeared wfl w/ no unilateral weakness noted during bolus management and oral clearing. Pt helped to feed self by holding cup during intake but needed feeding of foods.  ? ?Pt appears at increased risk for aspiration/aspiration pneumonia in setting of Baseline Dementia, medical comorbidities, need for assistance with feeding, and baseline Dysphagia(on Nectar liquids currently at her facility). Risk can be reduced w/ continuing a Dysphagia diet, feeding support and Supervision, safe swallowing strategies/aspiration precautions, reducing distractions during meals and engaging pt during po's at meal for self-feeding as able. Supervision w/ all oral intake. Pills Crushed in Puree for safer swallowing.   Pt is followed by Hospice Services at her facility.  ? ?Education discussed w/ pt's Husband re: diet recommendations, safe swallowing strategies and aspiration precautions, feeding support/Supervision at meals, and also foods for pleasure made easy to eat. Husband verbalized understanding/agreement. NSG made aware of results, recommendations, safe swallowing strategies/aspiration precautions.  ? ?ST services recommends follow w/ HOspice Services for ongoing education w/ Husband re: impact of Cognitive decline/Dementia on swallowing, risk for aspiration, and oral intake overall. Pt appears close to/at her Baseline per Husband's report.   ?SLP Visit Diagnosis: Dysphagia, oral phase (R13.11);Dysphagia, oropharyngeal phase (R13.12) (Baseline Dementia; on dysphagia diet baseline) ?   ?Aspiration Risk ? Mild aspiration risk;Moderate aspiration risk;Risk for inadequate nutrition/hydration  ?  ?Diet Recommendation   Dysphagia level 2(MINCED foods w/ added purees) diet w/ Nectar liquids, feeding support and Supervision, safe swallowing strategies/aspiration precautions, reducing distractions during meals and engaging pt during po's  at meal for self-feeding as able. Supervision w/ all oral intake.  ? ?Medication Administration: Crushed with puree  ?  ?Other  Recommendations Recommended Consults:  (Hospice services ongoing) ?Oral Care Recommendations: Oral care BID;Oral care before and after PO;Staff/trained caregiver to provide oral care ([employer] care) ?Other Recommendations: Order thickener from pharmacy;Prohibited food (jello, ice cream, thin soups);Remove water pitcher;Have oral suction available   ? ?Recommendations for follow up therapy are one component of a multi-disciplinary discharge planning process, led by the attending physician.  Recommendations may be updated based on patient status, additional functional criteria and insurance authorization. ? ?Follow up Recommendations Follow physician's recommendations for discharge plan and follow up therapies (suspect pt is at her baseline)  ? ? ?  ?Assistance Recommended at Discharge Frequent or constant Supervision/Assistance  ?Functional Status Assessment Patient has had a recent decline in their functional status and/or demonstrates limited ability to make significant improvements in function in a reasonable and predictable amount of time  ?Frequency and Duration min 1 x/week  ?1 week ?  ?   ? ?Prognosis Prognosis for Safe Diet Advancement: Fair ?Barriers to Reach Goals: Cognitive deficits;Time post onset;Severity of deficits ?Barriers/Prognosis Comment: baseline Dementia  ? ?  ? ?Swallow Study   ?General Date of Onset: 11/26/21 ?HPI: Pt is a 81 y.o. female with medical history including Dementia, GERD, falls, osteoporosis, and Klebsiella UTI in  November 2022 who was brought to the ED from assisted living with increased confusion resulting in a fall in which she sustained a laceration to the left brow.  Husband at bedside gives most of the history.  Patient is unable to contribute to the history due to dementia.  ED course and data review: Initially hypotensive at 94/58 in the ED with  otherwise normal vitals, fluid responsive to 113/54 by admission.  WBC 9700, hemoglobin 11.7.  Creatinine WNL, potassium 3.3.  Urinalysis strongly consistent with UTI.   CXR: Right lower lobe airspace opacity is noted

## 2021-11-27 NOTE — ED Notes (Signed)
Husband remains at bedside. Bed alarm turned on for precautions.  ?

## 2021-11-27 NOTE — Progress Notes (Signed)
Admission profile updated. ?

## 2021-11-27 NOTE — ED Notes (Addendum)
Pts spouse declined purewick at this time. Brief changed. No other concerns at this time. ?

## 2021-11-27 NOTE — TOC Initial Note (Signed)
 Transition of Care [facility]) - Initial/Assessment Note    Patient Details  Name: Ashley Young MRN: [id number] Date of Birth: 17-Aug-1940  [facility]) CM/SW Contact:    Margarito Liner, LCSW Phone Number: 11/27/2021, 10:05 AM  Clinical Narrative: Patient not fully oriented. CSW met with patient husband, introduced role, and explained that discharge planning would be discussed. He confirmed she is a long-term resident from [facility] and receives hospice services there. Patient has been at the facility since November. She receives hosp[facility] services through [facility]. No further concerns. CSW encouraged patient's husband to contact CSW as needed. CSW will continue to follow patient and her husband for support and facilitate return to SNF once medically stable.                 Expected Discharge Plan: Skilled Nursing Facility (with hospice) Barriers to Discharge: Continued Medical Work up   Patient Goals and CMS Choice     Choice offered to / list presented to : NA  Expected Discharge Plan and Services Expected Discharge Plan: Skilled Nursing Facility (with hospice)     Post Acute Care Choice: Resumption of Svcs/PTA Provider Living arrangements for the past 2 months: Skilled Nursing Facility                                      Prior Living Arrangements/Services Living arrangements for the past 2 months: Skilled Nursing Facility Lives with:: Facility Resident Patient language and need for interpreter reviewed:: Yes Do you feel safe going back to the place where you live?: Yes      Need for Family Participation in Patient Care: Yes (Comment) Care giver support system in place?: Yes (comment)   Criminal Activity/Legal Involvement Pertinent to Current Situation/Hospitalization: No - Comment as needed  Activities of Daily Living Home Assistive Devices/Equipment: Environmental consultant (specify type), Wheelchair ADL Screening (condition at time of  admission) Patient's cognitive ability adequate to safely complete daily activities?: No Is the patient deaf or have difficulty hearing?: No Does the patient have difficulty seeing, even when wearing glasses/contacts?: No Does the patient have difficulty concentrating, remembering, or making decisions?: Yes Patient able to express need for assistance with ADLs?: No Does the patient have difficulty dressing or bathing?: Yes Independently performs ADLs?: No Communication: Independent Dressing (OT): Needs assistance Is this a change from baseline?: Pre-admission baseline Grooming: Needs assistance Is this a change from baseline?: Pre-admission baseline Feeding: Needs assistance Is this a change from baseline?: Pre-admission baseline Bathing: Needs assistance Is this a change from baseline?: Pre-admission baseline Toileting: Needs assistance Is this a change from baseline?: Pre-admission baseline In/Out Bed: Needs assistance Is this a change from baseline?: Pre-admission baseline Walks in Home: Needs assistance Is this a change from baseline?: Pre-admission baseline Does the patient have difficulty walking or climbing stairs?: Yes Weakness of Legs: Both Weakness of Arms/Hands: Both  Permission Sought/Granted Permission sought to share information with : Facility Medical sales representative, Family Supports    Share Information with NAME: Shavna Gerry  Permission granted to share info w AGENCY: [facility]  Permission granted to share info w Relationship: Husband  Permission granted to share info w Contact Information: [phone]  Emotional Assessment Appearance:: Appears stated age Attitude/Demeanor/Rapport: Unable to Assess Affect (typically observed): Unable to Assess Orientation: : Oriented to Self, Oriented to  Time Alcohol / Substance Use: Not Applicable Psych Involvement:  No (comment)  Admission diagnosis:  Urinary tract infection [N39.0] Patient Active Problem  List   Diagnosis Date Noted   Protein calorie malnutrition (HCC) 11/27/2021   Sepsis secondary to UTI (HCC) 11/26/2021   Forehead laceration secondary to fall ,initial encounter 11/26/2021   Acute metabolic encephalopathy 11/26/2021   Pressure injury of skin 06/19/2021   Severe protein-calorie malnutrition (HCC) 06/15/2021   AMS (altered mental status) 06/14/2021   Urinary tract infection 06/13/2021   Senile dementia without behavioral disturbance (HCC) 02/13/2021   Closed compression fracture of L5 lumbar vertebra, sequela 01/04/2021   History of fracture of hips (Bilateral) 12/05/2020   Major depressive disorder, single episode, unspecified 11/23/2020   Abnormal gait 11/23/2020   Abnormal levels of other serum enzymes 11/23/2020   Allergic rhinitis 11/23/2020   Alzheimer's disease (HCC) 11/23/2020   Dementia (HCC) 11/23/2020   Congestive heart failure (HCC) 11/23/2020   History of cataract extraction 11/23/2020   Hyperlipidemia 11/23/2020   Hypocalcemia 11/23/2020   Idiopathic peripheral autonomic neuropathy 11/23/2020   Impaired glucose tolerance 11/23/2020   Insomnia 11/23/2020   Other elevated white blood cell count 11/23/2020   Leukocytosis 11/23/2020   Macrocytosis 11/23/2020   Mild recurrent major depression (HCC) 11/23/2020   Osteoarthritis 11/23/2020   Thrombocytosis 11/23/2020   Urge incontinence of urine 11/23/2020   Urinary incontinence 11/23/2020   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) 11/23/2020   Chronic systolic CHF (congestive heart failure) (HCC) 11/23/2020   Gastroesophageal reflux disease 11/23/2020   Migraine 11/23/2020   Iron deficiency anemia 11/23/2020   Chronic pain syndrome 11/23/2020   Pharmacologic therapy 11/23/2020   Disorder of skeletal system 11/23/2020   Problems influencing health status 11/23/2020   Chronic hip pain (Bilateral) 11/23/2020   Chronic hand pain (Bilateral) 11/23/2020   Ulnar deviation of  fingers of hands (Bilateral) 11/23/2020   Chronic low back pain (Bilateral) w/ sciatica (Bilateral) 11/23/2020   Confusion 05/02/2020   Difficulty sleeping 05/02/2020   Memory loss or impairment 12/21/2019   Other fatigue 12/21/2019   Leg cramping 10/27/2019   Chronic lower extremity pain (1ry area of Pain) (Bilateral) 10/27/2019   Restless legs syndrome 10/27/2019   Pulmonary hypertension, mild (HCC) 05/12/2017   Mood disorder (HCC)    DNR (do not resuscitate) 12/30/2016   Hip fracture, left (HCC)    Palliative care encounter    Hypokalemia 12/27/2016   Severe recurrent major depression without psychotic features (HCC) 12/24/2016   Failure to thrive (0-17) 12/24/2016   Closed fracture of ischium (HCC) 07/16/2016   Encounter for general adult medical examination without abnormal findings 05/06/2016   Chronic systolic heart failure (HCC) 01/12/2016   Borderline diabetes mellitus 01/02/2016   Pure hypercholesterolemia 01/02/2016   Goals of care, counseling/discussion 01/02/2016   Vaccine counseling 01/02/2016   B12 deficiency 04/26/2015   Iron deficiency anemia due to chronic blood loss 04/26/2015   History of anemia 03/28/2015   History of microcytic hypochromic anemia 03/28/2015   Osteoporosis, 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   DDD (degenerative disc disease), lumbar 03/09/2014   Degeneration of intervertebral disc of lumbar region 03/09/2014   Avitaminosis D 02/09/2014   Vitamin D deficiency 02/09/2014   Anxiety state 01/04/2014   Clinical depression 01/04/2014   Bilateral lower extremity edema 01/04/2014   Recurrent major depression in remission (HCC) 01/04/2014   Abdominal pain 11/19/2013   Acquired gastric outlet stenosis Aug 30, 2013   Adverse effect of salicylate August 30, 2013   Anxiety and depression 08-30-2013   Esophagitis 08/30/13  H/O gastrointestinal hemorrhage    H/O gastric ulcer    Disorder of nutrition    Headache, migraine     Calculus of kidney    OP (osteoporosis)    Malnourished (HCC)    PCP:  System, Provider Not In Pharmacy:   [facility] - [place], [place] - [address] [place] [address] Phone: [phone] Fax: [phone]  [facility] - [place], [place] - [address] [place] [address] Phone: [phone] Fax: [phone]     Social Determinants of Health (SDOH) Interventions    Readmission Risk Interventions     View : No data to display.

## 2021-11-27 NOTE — Progress Notes (Signed)
Patient checked for incontinence. No episodes at this time.  ?

## 2021-11-27 NOTE — H&P (Signed)
?History and Physical  ? ? ?Patient: Ashley Young DOB: 1941/06/23 ?DOA: 11/26/2021 ?DOS: the patient was seen and examined on 11/27/2021 ?PCP: System, Provider Not In  ?Patient coming from: ALF/ILF ? ?Chief Complaint:  ?Chief Complaint  ?Patient presents with  ? Fall  ? ? ?HPI: Ashley Young is a 81 y.o. female with medical history significant for Dementia and Klebsiella UTI in November 2022 who was brought to the ED from assisted living with increased confusion resulting in a fall in which she sustained a laceration to the left brow.  Husband at bedside gives most of the history.  Patient is unable to contribute to the history due to dementia. ?ED course and data review: Initially hypotensive at 94/58 in the ED with otherwise normal vitals, fluid responsive to 113/54 by admission.  WBC 9700, hemoglobin 11.7.  Creatinine WNL, potassium 3.3.  Urinalysis strongly consistent with UTI.  EKG, personally viewed and interpreted: Sinus rhythm at 90 with nonspecific ST-T wave changes.  CT head and C-spine showing the following ?IMPRESSION: ?1. Left forehead scalp contusion with no acute intracranial CT ?findings or depressed skull fractures. ?2. Osteopenia and degenerative changes of the cervical spine with 2 ?levels with degenerative grade 1 anterolisthesis. No evidence of ?fractures. ?3. heterogeneous calvarial marrow, clinical significance ?indeterminate but there has been no appreciable progression in this ?dating back to the oldest available head CT of 06/18/2014. ? ?Patient given a dose of Rocephin.  Laceration sutured in the ED.  Hospitalist consulted for admission.  ? ?Review of Systems  ?Unable to perform ROS: Dementia  ? ?Past Medical History:  ?Diagnosis Date  ? Arthritis   ? B12 deficiency   ? CHF (congestive heart failure) (HCC)   ? Chronic systolic heart failure (HCC)   ? Dementia (HCC)   ? Depression   ? Dysrhythmia   ? TACHYCARDIA  ? Failure to thrive (0-17)   ? GERD (gastroesophageal  reflux disease)   ? Glaucoma   ? Headache   ? MIGRAINES (now approx 1x/month)  ? History of kidney stones   ? Hypercholesterolemia   ? IDA (iron deficiency anemia)   ? Migraine   ? Mood disorder (HCC)   ? Multiple gastric ulcers   ? Osteoporosis   ? Renal disorder   ? Multiple episodes of kidney stones.   ? Wears dentures   ? full upper and lower  ? ?Past Surgical History:  ?Procedure Laterality Date  ? ABDOMINAL SURGERY    ? Reconstructed stomach and 3 different surgeries.   ? APPENDECTOMY    ? BREAST SURGERY    ? cyst removal  ? CATARACT EXTRACTION W/PHACO Right 06/05/2015  ? Procedure: CATARACT EXTRACTION PHACO AND INTRAOCULAR LENS PLACEMENT (IOC);  Surgeon: Lockie Mola, MD;  Location: [facility];  Service: Ophthalmology;  Laterality: Right;  [place] 01:06 ?  ? CATARACT EXTRACTION W/PHACO Left 04/15/2018  ? Procedure: CATARACT EXTRACTION PHACO AND INTRAOCULAR LENS PLACEMENT (IOC) LEFT;  Surgeon: Lockie Mola, MD;  Location: [facility];  Service: Ophthalmology;  Laterality: Left;  ? CESAREAN SECTION    ? x 3  ? CHOLECYSTECTOMY    ? COLONOSCOPY    ? ESOPHAGOGASTRODUODENOSCOPY    ? INTRAMEDULLARY (IM) NAIL INTERTROCHANTERIC Left 12/29/2016  ? Procedure: INTRAMEDULLARY (IM) NAIL INTERTROCHANTRIC;  Surgeon: Kennedy Bucker, MD;  Location: [facility];  Service: Orthopedics;  Laterality: Left;  ? KYPHOPLASTY    ? KYPHOPLASTY    ? LITHOTRIPSY    ? and stone extraction  ? ?Social  History:  reports that she quit smoking about 28 years ago. Her smoking use included cigarettes. She has never used smokeless tobacco. She reports that she does not drink alcohol and does not use drugs. ? ?Allergies  ?Allergen Reactions  ? Penicillins Swelling and Rash  ? Aspirin Other (See Comments)  ?  Stomach ulcers, bleeding  ?GI bleed  ? Ferrous Gluconate Nausea Only and Other (See Comments)  ?  Cannot take in IV form  ? Iron Polysaccharide Rash  ? Nsaids Rash and Other (See Comments)  ?  Severe GI upset and prior ulcer  ? Other  Rash and Other (See Comments)  ?  Venofen- unknown  ? Sulfamethoxazole-Trimethoprim Rash  ? Tolmetin Rash  ?  Severe GI upset and prior ulcer  ? Venofer [Iron Sucrose] Rash  ? ? ?Family History  ?Problem Relation Age of Onset  ? Breast cancer Mother   ? Lung cancer Father   ? ? ?Prior to Admission medications   ?Medication Sig Start Date End Date Taking? Authorizing Provider  ?acetaminophen (TYLENOL) 325 MG tablet Take 2 tablets (650 mg total) by mouth every 6 (six) hours as needed for mild pain (or Fever >/= 101). 12/31/16   Auburn BilberryPatel, Shreyang, MD  ?ALPRAZolam Prudy Feeler(XANAX) 0.25 MG tablet Take 1 tablet (0.25 mg total) by mouth every 6 (six) hours as needed. 06/17/21   Hollice EspyKrishnan, Sendil K, MD  ?ARIPiprazole (ABILIFY) 10 MG tablet Take 10 mg by mouth daily.    [provider]  ?DULoxetine (CYMBALTA) 30 MG capsule Take 30 mg by mouth daily.     [provider]  ?Melatonin 10 MG CAPS Take 10 mg by mouth at bedtime as needed.    [provider]  ?Multiple Vitamin (MULTIVITAMIN) tablet Take 1 tablet by mouth daily.    [provider]  ?pantoprazole (PROTONIX) 40 MG tablet Take 40 mg by mouth daily.  09/08/14   [provider]  ?SUMAtriptan (IMITREX) 100 MG tablet 100 mg every 2 (two) hours as needed.  12/30/14   [provider]  ?traMADol (ULTRAM) 50 MG tablet Take 1.5 tablets (75 mg total) by mouth at bedtime. 06/19/21   Hollice EspyKrishnan, Sendil K, MD  ?traZODone (DESYREL) 50 MG tablet Take 3 tablets (150 mg total) by mouth at bedtime. 06/19/21   Hollice EspyKrishnan, Sendil K, MD  ? ? ?Physical Exam: ?Vitals:  ? 11/26/21 2113 11/26/21 2117 11/26/21 2130 11/26/21 2337  ?BP:  (!) 94/58 107/60 (!) 113/54  ?Pulse: 90  89 80  ?Resp: 16  14 18   ?Temp: 98.2 ?F (36.8 ?C)     ?TempSrc: Oral     ?SpO2: 97%  97% 96%  ?Weight:      ?Height:      ? ?Physical Exam ?Vitals and nursing note reviewed.  ?Constitutional:   ?   General: She is not in acute distress. ?HENT:  ?   Head: Normocephalic and atraumatic.  ?    Comments: Laceration left brow ?Cardiovascular:  ?   Rate and Rhythm: Normal rate and regular rhythm.  ?   Pulses: Normal pulses.  ?   Heart sounds: Normal heart sounds.  ?Pulmonary:  ?   Effort: Pulmonary effort is normal.  ?   Breath sounds: Normal breath sounds.  ?Abdominal:  ?   Palpations: Abdomen is soft.  ?   Tenderness: There is no abdominal tenderness.  ?Neurological:  ?   General: No focal deficit present.  ?   Mental Status: She is disoriented.  ? ? ? ?  Data Reviewed: ?Relevant notes from primary care and specialist visits, past discharge summaries as available in EHR, including Care Everywhere. ?Prior diagnostic testing as pertinent to current admission diagnoses ?Updated medications and problem lists for reconciliation ?ED course, including vitals, labs, imaging, treatment and response to treatment ?Triage notes, nursing and pharmacy notes and ED provider's notes ?Notable results as noted in HPI ? ? ?Assessment and Plan: ?* Urinary tract infection ?History of pansensitive Klebsiella UTI in November 2022 ?IV Rocephin and follow cultures ? ?Acute metabolic encephalopathy ?Patient with increased confusion from baseline ?Neurologic checks with fall and aspiration precautions ?We will keep n.p.o. tonight ? ?Forehead laceration secondary to fall ,initial encounter ?Sutured in the ED ?No evidence of intracranial injury ?Wound care.  Expect suture removal in 5 to 7 days ? ?Dementia (HCC) ?Continue Cymbalta, Abilify, trazodone and Xanax ?Delirium precautions ? ? ? ? ? ? ?Advance Care Planning:   Code Status: Prior DNR ? ?Consults: none ? ?Family Communication: Husband at bedside ? ?Severity of Illness: ?The appropriate patient status for this patient is INPATIENT. Inpatient status is judged to be reasonable and necessary in order to provide the required intensity of service to ensure the patient's safety. The patient's presenting symptoms, physical exam findings, and initial radiographic and laboratory data in  the context of their chronic comorbidities is felt to place them at high risk for further clinical deterioration. Furthermore, it is not anticipated that the patient will be medically stable for discharge

## 2021-11-28 DIAGNOSIS — S0181XA Laceration without foreign body of other part of head, initial encounter: Secondary | ICD-10-CM | POA: Diagnosis present

## 2021-11-28 DIAGNOSIS — G309 Alzheimer's disease, unspecified: Secondary | ICD-10-CM | POA: Diagnosis present

## 2021-11-28 DIAGNOSIS — W19XXXA Unspecified fall, initial encounter: Secondary | ICD-10-CM | POA: Diagnosis present

## 2021-11-28 DIAGNOSIS — Z681 Body mass index (BMI) 19 or less, adult: Secondary | ICD-10-CM | POA: Diagnosis not present

## 2021-11-28 DIAGNOSIS — Z87440 Personal history of urinary (tract) infections: Secondary | ICD-10-CM | POA: Diagnosis not present

## 2021-11-28 DIAGNOSIS — F32A Depression, unspecified: Secondary | ICD-10-CM | POA: Diagnosis present

## 2021-11-28 DIAGNOSIS — E43 Unspecified severe protein-calorie malnutrition: Secondary | ICD-10-CM | POA: Diagnosis present

## 2021-11-28 DIAGNOSIS — M47812 Spondylosis without myelopathy or radiculopathy, cervical region: Secondary | ICD-10-CM | POA: Diagnosis present

## 2021-11-28 DIAGNOSIS — M545 Low back pain, unspecified: Secondary | ICD-10-CM | POA: Diagnosis present

## 2021-11-28 DIAGNOSIS — F02818 Dementia in other diseases classified elsewhere, unspecified severity, with other behavioral disturbance: Secondary | ICD-10-CM | POA: Diagnosis present

## 2021-11-28 DIAGNOSIS — N3000 Acute cystitis without hematuria: Secondary | ICD-10-CM | POA: Diagnosis present

## 2021-11-28 DIAGNOSIS — M810 Age-related osteoporosis without current pathological fracture: Secondary | ICD-10-CM | POA: Diagnosis present

## 2021-11-28 DIAGNOSIS — N390 Urinary tract infection, site not specified: Secondary | ICD-10-CM | POA: Diagnosis present

## 2021-11-28 DIAGNOSIS — G8929 Other chronic pain: Secondary | ICD-10-CM | POA: Diagnosis present

## 2021-11-28 DIAGNOSIS — I272 Pulmonary hypertension, unspecified: Secondary | ICD-10-CM | POA: Diagnosis present

## 2021-11-28 DIAGNOSIS — I959 Hypotension, unspecified: Secondary | ICD-10-CM | POA: Diagnosis present

## 2021-11-28 DIAGNOSIS — G9341 Metabolic encephalopathy: Secondary | ICD-10-CM

## 2021-11-28 DIAGNOSIS — Z1611 Resistance to penicillins: Secondary | ICD-10-CM | POA: Diagnosis present

## 2021-11-28 DIAGNOSIS — I5022 Chronic systolic (congestive) heart failure: Secondary | ICD-10-CM | POA: Diagnosis present

## 2021-11-28 DIAGNOSIS — Z66 Do not resuscitate: Secondary | ICD-10-CM | POA: Diagnosis present

## 2021-11-28 MED ORDER — MORPHINE SULFATE 10 MG/5ML PO SOLN
5.0000 mg | Freq: Once | ORAL | Status: AC
  Administered 2021-11-28: 5 mg via ORAL
  Filled 2021-11-28: qty 4

## 2021-11-28 MED ORDER — OXYCODONE HCL 5 MG PO CAPS: 5.0000 mg | ORAL_CAPSULE | Freq: Four times a day (QID) | ORAL | 0 refills | Status: AC | PRN

## 2021-11-28 MED ORDER — CEPHALEXIN 500 MG PO CAPS: 500.0000 mg | ORAL_CAPSULE | Freq: Two times a day (BID) | ORAL | 0 refills | Status: AC

## 2021-11-28 MED ORDER — MORPHINE SULFATE 20 MG/5ML PO SOLN: 5.0000 mg | ORAL | 0 refills | Status: AC | PRN

## 2021-11-28 MED ORDER — LORAZEPAM 0.5 MG PO TABS: 0.5000 mg | ORAL_TABLET | ORAL | 0 refills | Status: AC | PRN

## 2021-11-28 MED ORDER — FENTANYL 25 MCG/HR TD PT72: 1.0000 | MEDICATED_PATCH | TRANSDERMAL | 0 refills | Status: AC

## 2021-11-28 MED ORDER — TRAMADOL HCL 50 MG PO TABS: 75.0000 mg | ORAL_TABLET | Freq: Every day | ORAL | 0 refills | Status: AC

## 2021-11-28 NOTE — Discharge Summary (Addendum)
?Physician Discharge Summary ?  ?Patient: Ashley Young MRN: [id number] DOB: [birth year]  ?Admit date:     11/26/2021  ?Discharge date: 11/28/21  ?Discharge Physician: Arnetha Courser  ? ?PCP: System, Provider Not In  ? ?Recommendations at discharge:  ?Follow up final urine culture results. ?Please try avoiding sedating medications as that can be contributory to her dizziness.  We continued her home medications as she is on hospice patient, avoid if no need.   ?She is being discharged on 4 more days of antibiotics based on prior urine culture results, current final culture results are pending. ?Follow-up with primary care provider and hospice. ? ?Discharge Diagnoses: ?Principal Problem: ?  Urinary tract infection ?Active Problems: ?  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  Acute metabolic encephalopathy ?  Forehead laceration secondary to fall ,initial encounter ?  Dementia (HCC) ?  Protein calorie malnutrition (HCC) ? ? ?Hospital Course: ?Ashley Young is a 81 y.o. female with medical history significant for Dementia and Klebsiella UTI in November 2022 who was brought to the ED from assisted living with increased confusion resulting in a fall in which she sustained a laceration to the left brow.  Husband at bedside gives most of the history.  Patient is unable to contribute to the history due to dementia. ?ED course and data review: Initially hypotensive at 94/58 in the ED with otherwise normal vitals, fluid responsive to 113/54 by admission.  WBC 9700, hemoglobin 11.7.  Creatinine WNL, potassium 3.3.  Urinalysis strongly consistent with UTI.  EKG, personally viewed and interpreted: Sinus rhythm at 90 with nonspecific ST-T wave changes.  CT head and C-spine showing the following ?1. Left forehead scalp contusion with no acute intracranial CT ?findings or depressed skull fractures. ?2. Osteopenia and degenerative changes of the cervical spine with 2 ?levels with degenerative grade 1  anterolisthesis. No evidence of ?fractures. ?3. heterogeneous calvarial marrow, clinical significance ?indeterminate but there has been no appreciable progression in this ?dating back to the oldest available head CT of 06/18/2014. ? ?Preliminary cultures with Klebsiella Oxytoca and enterococcus,Final culture results are pending, prior culture results with Klebsiella only resistant to amoxicillin.  Patient received ceftriaxone while in the hospital and discharged on Keflex to complete a 5-day course. ? ?Her acute metabolic encephalopathy can be multifactorial with advanced underlying dementia and on multiple home medications which can be contributory.  Patient is on hospice patient and using of sedating medications should be judicial to avoid dizziness and fall. ? ?Patient was also found to have right lower lobe infiltrate, clinically no sign of infection, malignancy was on differential but CT was not obtained due to her hospice status.  Husband just want to treat the treatable's and no aggressive care. ? ?We obtain swallow evaluation and she is having oropharyngeal dysphagia with mild risk of aspiration, suggesting dysphagia diet with nectar thick liquids. ? ?Patient will continue with the rest of her home medications and follow-up with her providers. ? ?Assessment and Plan: ?* Urinary tract infection ?History of pansensitive Klebsiella UTI in November 2022 ?IV Rocephin and follow cultures ? ?Acute metabolic encephalopathy ?Patient with increased confusion from baseline ?Neurologic checks with fall and aspiration precautions ?We will keep n.p.o. tonight ? ?Forehead laceration secondary to fall ,initial encounter ?Sutured in the ED ?No evidence of intracranial injury ?Wound care.  Expect suture removal in 5 to 7 days ? ?Dementia (HCC) ?Continue Cymbalta, Abilify, trazodone and Xanax ?Delirium precautions ? ?  ? ?Pain control - [facility]  Controlled Substance Reporting System database was reviewed. and patient was  instructed, not to drive, operate heavy machinery, perform activities at heights, swimming or participation in water activities or provide baby-sitting services while on Pain, Sleep and Anxiety Medications; until their outpatient Physician has advised to do so again. Also recommended to not to take more than prescribed Pain, Sleep and Anxiety Medications.  ? ?Consultants: None ?Procedures performed: None ?Disposition: Skilled nursing facility ?Diet recommendation:  ?Dysphagia type 2   Liquid with nectar thick liquid ?DISCHARGE MEDICATION: ?Allergies as of 11/28/2021   ? ?   Reactions  ? Penicillins Swelling, Rash  ? Aspirin Other (See Comments)  ? Stomach ulcers, bleeding  ?GI bleed  ? Ferrous Gluconate Nausea Only, Other (See Comments)  ? Cannot take in IV form  ? Iron Polysaccharide Rash  ? Nsaids Rash, Other (See Comments)  ? Severe GI upset and prior ulcer  ? Other Rash, Other (See Comments)  ? Venofen- unknown  ? Sulfamethoxazole-trimethoprim Rash  ? Tolmetin Rash  ? Severe GI upset and prior ulcer  ? Venofer [iron Sucrose] Rash  ? ?  ? ?  ?Medication List  ?  ? ?STOP taking these medications   ? ?ALPRAZolam 0.25 MG tablet ?Commonly known as: Prudy FeelerXANAX ?  ?ARIPiprazole 10 MG tablet ?Commonly known as: ABILIFY ?  ?pantoprazole 40 MG tablet ?Commonly known as: PROTONIX ?  ? ?  ? ?TAKE these medications   ? ?acetaminophen 325 MG tablet ?Commonly known as: TYLENOL ?Take 2 tablets (650 mg total) by mouth every 6 (six) hours as needed for mild pain (or Fever >/= 101). ?  ?atropine 1 % ophthalmic solution ?Place 1 drop under the tongue every 2 (two) hours as needed. ?  ?bisacodyl 10 MG suppository ?Commonly known as: DULCOLAX ?Place 10 mg rectally daily as needed for moderate constipation. ?  ?cephALEXin 500 MG capsule ?Commonly known as: KEFLEX ?Take 1 capsule (500 mg total) by mouth 2 (two) times daily for 4 days. ?  ?DULoxetine 30 MG capsule ?Commonly known as: CYMBALTA ?Take 30 mg by mouth daily. ?  ?famotidine 20 MG  tablet ?Commonly known as: PEPCID ?Take 20 mg by mouth at bedtime. ?  ?fentaNYL 25 MCG/HR ?Commonly known as: DURAGESIC ?Place 1 patch onto the skin every 3 (three) days. ?  ?LORazepam 0.5 MG tablet ?Commonly known as: ATIVAN ?Take 1 tablet (0.5 mg total) by mouth every 4 (four) hours as needed for anxiety. ?  ?Melatonin 10 MG Caps ?Take 10 mg by mouth at bedtime as needed. ?  ?morphine 20 MG/5ML solution ?Take 1.3 mLs (5.2 mg total) by mouth every hour as needed for pain. ?What changed: how much to take ?  ?multivitamin tablet ?Take 1 tablet by mouth daily. ?  ?oxycodone 5 MG capsule ?Commonly known as: OXY-IR ?Take 1 capsule (5 mg total) by mouth every 6 (six) hours as needed for pain. ?  ?SUMAtriptan 100 MG tablet ?Commonly known as: IMITREX ?100 mg every 2 (two) hours as needed. ?  ?traMADol 50 MG tablet ?Commonly known as: ULTRAM ?Take 1.5 tablets (75 mg total) by mouth at bedtime. ?  ?traZODone 150 MG tablet ?Commonly known as: DESYREL ?Take 150 mg by mouth at bedtime. ?What changed: Another medication with the same name was removed. Continue taking this medication, and follow the directions you see here. ?  ? ?  ? ?  ?  ? ? ?  ?Discharge Care Instructions  ?(From admission, onward)  ?  ? ? ?  ? ?  Start     Ordered  ? 11/28/21 0000  No dressing needed       ? 11/28/21 1205  ? ?  ?  ? ?  ? ? Contact information for after-discharge care   ? ? Destination   ? ? [facility]erred [facility] .   ?Service: Skilled Nursing ?Contact information: ?[address] ?[place] [address] ?[phone] ? ?  ?  ? ?  ?  ? ?  ?  ? ?  ? ?Discharge Exam: ?Filed Weights  ? 11/26/21 2105  ?Weight: 45.4 kg  ? ?General.Frail and severely malnourished lady,In no acute distress. ?Pulmonary.  Lungs clear bilaterally, normal respiratory effort. ?CV.  Regular rate and rhythm, no JVD, rub or murmur. ?Abdomen.  Soft, nontender, nondistended, BS  positive. ?CNS.  Awake but not oriented.  No focal neurologic deficit. ?Extremities.  No edema, no cyanosis, pulses intact and symmetrical. ?Psychiatry.  Judgment and insight appears impaired. ? ?Condition at disch

## 2021-11-28 NOTE — TOC Transition Note (Signed)
Transition of Care (TOC) - CM/SW Discharge Note ? ? ?Patient Details  ?Name: Ashley Young ?MRN: [id number] ?Date of Birth: 08-18-1940 ? ?Transition of Care (TOC) CM/SW Contact:  ?Margarito Liner, LCSW ?Phone Number: ?11/28/2021, 2:36 PM ? ? ?Clinical Narrative:  Patient has orders to discharge to [facility] today. RN has already called report. EMS transport has been arranged. No further concerns. CSW signing off.  ? ?Final next level of care: Skilled Nursing Facility ?Barriers to Discharge: Barriers Resolved ? ? ?Patient Goals and CMS Choice ?  ?  ?Choice offered to / list presented to : NA ? ?Discharge Placement ?  ?Existing PASRR number confirmed : 11/28/21          ?Patient chooses bed at: [facility] ?Patient to be transferred to facility by: EMS ?Name of family member notified: Rucha Wissinger ?Patient and family notified of of transfer: 11/28/21 ? ?Discharge Plan and Services ?  ?  ?Post Acute Care Choice: Resumption of Svcs/PTA Provider          ?  ?  ?  ?  ?  ?  ?  ?  ?  ?  ? ?Social Determinants of Health (SDOH) Interventions ?  ? ? ?Readmission Risk Interventions ? ?  11/28/2021  ?  8:14 AM  ?Readmission Risk Prevention Plan  ?Transportation Screening Complete  ?PCP or Specialist Appt within 3-5 Days Complete  ?Social Work Consult for Recovery Care Planning/Counseling Complete  ?Palliative Care Screening Complete  ?Medication Review Oceanographer) Complete  ? ? ? ? ? ?

## 2021-11-28 NOTE — Plan of Care (Signed)
?  Problem: Clinical Measurements: ?Goal: Ability to maintain clinical measurements within normal limits will improve ?Outcome: Progressing ?Goal: Will remain free from infection ?Outcome: Progressing ?Goal: Diagnostic test results will improve ?Outcome: Progressing ?Goal: Respiratory complications will improve ?Outcome: Progressing ?Goal: Cardiovascular complication will be avoided ?Outcome: Progressing ?  ?Problem: Pain Managment: ?Goal: General experience of comfort will improve ?Outcome: Progressing ?  ?Pt is alert, very confused and restless. Ativan given with short relief. Pt seems in pain but unable to say where the pain is, oncall Np informed and given morphine. V/S stable. ?

## 2021-11-28 NOTE — [place] FL2 (Signed)
? MEDICAID FL2 LEVEL OF CARE SCREENING TOOL  ?  ? ?IDENTIFICATION  ?Patient Name: ?Ashley Young Birthdate: 12-07-1940 Sex: female Admission Date (Current Location): ?11/26/2021  ?[place]: ? [employer] ?  Facility and Address:  ?[facility], [address] [place], [place] [address] ?     Provider Number: ?[id number]  ?Attending Physician Name and Address:  ?Arnetha Courser, MD ? Relative Name and Phone Number:  ?  ?   ?Current Level of Care: ?Hospital Recommended Level of Care: ?Skilled Nursing Facility (with hospice) Prior Approval Number: ?  ? ?Date Approved/Denied: ?  PASRR Number: ?[id number] H ? ?Discharge Plan: ?SNF (with hospice) ?  ? ?Current Diagnoses: ?Patient Active Problem List  ? Diagnosis Date Noted  ? Protein calorie malnutrition (HCC) 11/27/2021  ? Sepsis secondary to UTI (HCC) 11/26/2021  ? Forehead laceration secondary to fall ,initial encounter 11/26/2021  ? Acute metabolic encephalopathy 11/26/2021  ? Pressure injury of skin 06/19/2021  ? Severe protein-calorie malnutrition (HCC) 06/15/2021  ? AMS (altered mental status) 06/14/2021  ? Urinary tract infection 06/13/2021  ? 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 Closed compression fracture of L5 lumbar vertebra, sequela 01/04/2021  ? History of fracture of hips (Bilateral) 12/05/2020  ? Major depressive disorder, single episode, unspecified 11/23/2020  ? Abnormal gait 11/23/2020  ? Abnormal levels of other serum enzymes 11/23/2020  ? Allergic rhinitis 11/23/2020  ? Alzheimer's disease (HCC) 11/23/2020  ? Dementia (HCC) 11/23/2020  ? Congestive heart failure (HCC) 11/23/2020  ? History of cataract extraction 11/23/2020  ? Hyperlipidemia 11/23/2020  ? Hypocalcemia 11/23/2020  ? Idiopathic peripheral autonomic neuropathy 11/23/2020  ? Impaired glucose tolerance 11/23/2020  ? Insomnia 11/23/2020  ? Other elevated white blood cell count 11/23/2020  ? Leukocytosis  11/23/2020  ? Macrocytosis 11/23/2020  ? Mild recurrent major depression (HCC) 11/23/2020  ? Osteoarthritis 11/23/2020  ? Thrombocytosis 11/23/2020  ? Urge incontinence of urine 11/23/2020  ? Urinary incontinence 11/23/2020  ? 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 Chronic systolic CHF (congestive heart failure) (HCC) 11/23/2020  ? Gastroesophageal reflux disease 11/23/2020  ? Migraine 11/23/2020  ? Iron deficiency anemia 11/23/2020  ? Chronic pain syndrome 11/23/2020  ? Pharmacologic therapy 11/23/2020  ? Disorder of skeletal system 11/23/2020  ? Problems influencing health status 11/23/2020  ? Chronic hip pain (Bilateral) 11/23/2020  ? Chronic hand pain (Bilateral) 11/23/2020  ? Ulnar deviation of fingers of hands (Bilateral) 11/23/2020  ? Chronic low back pain (Bilateral) w/ sciatica (Bilateral) 11/23/2020  ? Confusion 05/02/2020  ? Difficulty sleeping 05/02/2020  ? Memory loss or impairment 12/21/2019  ? Other fatigue 12/21/2019  ? Leg cramping 10/27/2019  ? Chronic lower extremity pain (1ry area of Pain) (Bilateral) 10/27/2019  ? Restless legs syndrome 10/27/2019  ? Pulmonary hypertension, mild (HCC) 05/12/2017  ? Mood disorder (HCC)   ? DNR (do not resuscitate) 12/30/2016  ? Hip fracture, left (HCC)   ? Palliative care encounter   ? Hypokalemia 12/27/2016  ? Severe recurrent major depression without psychotic features (HCC) 12/24/2016  ? Failure to thrive (0-17) 12/24/2016  ? Closed fracture of ischium (HCC) 07/16/2016  ? Encounter for general adult medical examination without abnormal findings 05/06/2016  ? Chronic systolic heart failure (HCC) 01/12/2016  ? Borderline diabetes mellitus 01/02/2016  ? Pure hypercholesterolemia 01/02/2016  ? Goals of care, counseling/discussion 01/02/2016  ? Vaccine counseling 01/02/2016  ? B12 deficiency 04/26/2015  ? Iron deficiency  anemia due to chronic blood loss 04/26/2015  ? History of anemia 03/28/2015  ? History of microcytic  hypochromic anemia 03/28/2015  ? 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 DDD (degenerative disc disease), lumbar 03/09/2014  ? Degeneration of intervertebral disc of lumbar region 03/09/2014  ? Avitaminosis D 02/09/2014  ? Vitamin D deficiency 02/09/2014  ? Anxiety state 01/04/2014  ? Clinical depression 01/04/2014  ? Bilateral lower extremity edema 01/04/2014  ? Recurrent major depression in remission (HCC) 01/04/2014  ? Abdominal pain 11/19/2013  ? Acquired gastric outlet stenosis 08/06/2013  ? Adverse effect of salicylate 08/14/2013  ? Anxiety and depression 08/07/2013  ? Esophagitis 07/30/2013  ? H/O gastrointestinal hemorrhage 08/09/2013  ? H/O gastric ulcer 08/18/2013  ? Disorder of nutrition 08/10/2013  ? Headache, migraine 07/31/2013  ? Calculus of kidney 08/24/2013  ? OP (osteoporosis) 07/29/2013  ? Malnourished (HCC) 08/10/2013  ? ? ?Orientation RESPIRATION BLADDER Height & Weight   ?  ?Self, Time ? Normal Incontinent Weight: 100 lb (45.4 kg) ?Height:  5\' 1"  (154.9 cm)  ?BEHAVIORAL SYMPTOMS/MOOD NEUROLOGICAL BOWEL NUTRITION STATUS  ? (None)  (Dementia) Incontinent Diet (DYS 2. Fluid nectar thick. Extra Gravy on meats, potatoes.  Extra Butters and condiments for foods. Yogurt, pudding, Nectar cream soup in a Mug.)  ?AMBULATORY STATUS COMMUNICATION OF NEEDS Skin   ?  Verbally Other (Comment), PU Stage and Appropriate Care (Erythema/redness.) ?PU Stage 1 Dressing:  (Medial coccyx: Foam prn) ?  ?  ?    ?     ?     ? ? ?Personal Care Assistance Level of Assistance  ?    ?  ?  ?   ? ?Functional Limitations Info  ?Sight, Hearing, Speech Sight Info: Adequate ?Hearing Info: Adequate ?Speech Info: Adequate  ? ? ?SPECIAL CARE FACTORS FREQUENCY  ?    ?  ?  ?  ?  ?  ?  ?   ? ? ?Contractures Contractures Info: Not present  ? ? ?Additional Factors Info  ?Code Status, Allergies Code Status Info: DNR ?Allergies Info: Penicillins, Aspirin, Ferrous Gluconate, Iron Polysaccharide, Nsaids, Venofen,  Sulfamethoxazole-trimethoprim, Tolmetin, Venofer (Iron Sucrose) ?  ?  ?  ?   ? ?Current Medications (11/28/2021):  This is the current hospital active medication list ?Current Facility-Administered Medications  ?Medication Dose Route Frequency Provider Last Rate Last Admin  ? acetaminophen (TYLENOL) tablet 650 mg  650 mg Oral Q6H PRN Andris Baumannuncan, Hazel V, MD   650 mg at 11/28/21 0111  ? Or  ? acetaminophen (TYLENOL) suppository 650 mg  650 mg Rectal Q6H PRN Andris Baumannuncan, Hazel V, MD      ? bisacodyl (DULCOLAX) suppository 10 mg  10 mg Rectal Daily PRN Wouk, Wilfred CurtisNoah Bedford, MD      ? cefTRIAXone (ROCEPHIN) 1 g in sodium chloride 0.9 % 100 mL IVPB  1 g Intravenous Q24H Otelia SergeantBelue, Nathan S, RPH   Stopped at 11/27/21 2134  ? DULoxetine (CYMBALTA) DR capsule 30 mg  30 mg Oral Daily Andris Baumannuncan, Hazel V, MD   30 mg at 11/28/21 0800  ? enoxaparin (LOVENOX) injection 40 mg  40 mg Subcutaneous Q24H Andris Baumannuncan, Hazel V, MD   40 mg at 11/28/21 0801  ? famotidine (PEPCID) tablet 20 mg  20 mg Oral QHS Wouk, Wilfred CurtisNoah Bedford, MD   20 mg at 11/27/21 2050  ? LORazepam (ATIVAN) tablet 0.5 mg  0.5 mg Oral Q4H PRN Kathrynn RunningWouk, Noah Bedford, MD   0.5 mg at 11/28/21 0801  ? melatonin tablet 10 mg  10 mg Oral QHS PRN Kathrynn Running, MD   10 mg at 11/27/21 2049  ? ondansetron (ZOFRAN) tablet 4 mg  4 mg Oral Q6H PRN Andris Baumann, MD      ? Or  ? ondansetron [facility]) injection 4 mg  4 mg Intravenous Q6H PRN Andris Baumann, MD      ? ? ? ?Discharge Medications: ?Please see discharge summary for a list of discharge medications. ? ?Relevant Imaging Results: ? ?Relevant Lab Results: ? ? ?Additional Information ?SS#: [id number] ? ?Margarito Liner, LCSW ? ? ? ? ?

## 2021-11-28 NOTE — Progress Notes (Signed)
Report called to Soumy at [facility] [employer] ? ?

## 2021-11-29 LAB — URINE CULTURE: Culture: >=100000 — AB

## 2021-12-27 DEATH — deceased

## 2021-12-31 ENCOUNTER — Encounter: Payer: Self-pay | Admitting: Internal Medicine
